# Patient Record
Sex: Female | Born: 1940
Health system: Southern US, Community
[De-identification: ages and names within clinical notes are randomized; demographics above are authoritative.]

## PROBLEM LIST (undated history)

## (undated) DIAGNOSIS — I10 Essential (primary) hypertension: Secondary | ICD-10-CM

## (undated) DIAGNOSIS — M8580 Other specified disorders of bone density and structure, unspecified site: Secondary | ICD-10-CM

## (undated) DIAGNOSIS — M5126 Other intervertebral disc displacement, lumbar region: Secondary | ICD-10-CM

## (undated) DIAGNOSIS — I471 Supraventricular tachycardia, unspecified: Secondary | ICD-10-CM

## (undated) DIAGNOSIS — I5189 Other ill-defined heart diseases: Secondary | ICD-10-CM

## (undated) DIAGNOSIS — M1990 Unspecified osteoarthritis, unspecified site: Secondary | ICD-10-CM

## (undated) DIAGNOSIS — M75101 Unspecified rotator cuff tear or rupture of right shoulder, not specified as traumatic: Secondary | ICD-10-CM

## (undated) DIAGNOSIS — E785 Hyperlipidemia, unspecified: Secondary | ICD-10-CM

## (undated) DIAGNOSIS — G629 Polyneuropathy, unspecified: Secondary | ICD-10-CM

## (undated) DIAGNOSIS — R94131 Abnormal electromyogram [EMG]: Secondary | ICD-10-CM

## (undated) DIAGNOSIS — S52501A Unspecified fracture of the lower end of right radius, initial encounter for closed fracture: Secondary | ICD-10-CM

## (undated) DIAGNOSIS — D509 Iron deficiency anemia, unspecified: Secondary | ICD-10-CM

## (undated) DIAGNOSIS — I639 Cerebral infarction, unspecified: Secondary | ICD-10-CM

## (undated) HISTORY — PX: BACK SURGERY: SHX140

## (undated) HISTORY — DX: Hyperlipidemia, unspecified: E78.5

## (undated) HISTORY — DX: Unspecified rotator cuff tear or rupture of right shoulder, not specified as traumatic: M75.101

## (undated) HISTORY — PX: SHOULDER ARTHROSCOPY: SHX128

## (undated) HISTORY — DX: Supraventricular tachycardia, unspecified: I47.10

## (undated) HISTORY — DX: Unspecified osteoarthritis, unspecified site: M19.90

## (undated) HISTORY — DX: Polyneuropathy, unspecified: G62.9

## (undated) HISTORY — DX: Other specified disorders of bone density and structure, unspecified site: M85.80

## (undated) HISTORY — DX: Other intervertebral disc displacement, lumbar region: M51.26

## (undated) HISTORY — DX: Iron deficiency anemia, unspecified: D50.9

## (undated) HISTORY — DX: Essential (primary) hypertension: I10

## (undated) HISTORY — DX: Abnormal electromyogram (EMG): R94.131

## (undated) HISTORY — DX: Supraventricular tachycardia: I47.1

---

## 1990-06-21 HISTORY — PX: TOTAL ABDOMINAL HYSTERECTOMY: SHX209

## 1996-06-21 HISTORY — PX: LAPAROSCOPIC CHOLECYSTECTOMY: SUR755

## 1997-11-04 ENCOUNTER — Encounter: Admission: RE | Admit: 1997-11-04 | Discharge: 1997-11-04 | Payer: Self-pay | Admitting: Internal Medicine

## 1997-12-12 ENCOUNTER — Ambulatory Visit: Admission: RE | Admit: 1997-12-12 | Discharge: 1997-12-12 | Payer: Self-pay | Admitting: Internal Medicine

## 1998-04-24 ENCOUNTER — Encounter: Admission: RE | Admit: 1998-04-24 | Discharge: 1998-04-24 | Payer: Self-pay | Admitting: Internal Medicine

## 1998-04-24 ENCOUNTER — Ambulatory Visit (HOSPITAL_COMMUNITY): Admission: RE | Admit: 1998-04-24 | Discharge: 1998-04-24 | Payer: Self-pay | Admitting: Internal Medicine

## 1998-10-28 ENCOUNTER — Encounter: Admission: RE | Admit: 1998-10-28 | Discharge: 1998-10-28 | Payer: Self-pay | Admitting: Internal Medicine

## 1998-10-29 ENCOUNTER — Encounter: Admission: RE | Admit: 1998-10-29 | Discharge: 1998-10-29 | Payer: Self-pay | Admitting: Internal Medicine

## 1998-12-01 ENCOUNTER — Encounter: Admission: RE | Admit: 1998-12-01 | Discharge: 1998-12-01 | Payer: Self-pay | Admitting: Internal Medicine

## 1999-01-20 ENCOUNTER — Emergency Department (HOSPITAL_COMMUNITY): Admission: EM | Admit: 1999-01-20 | Discharge: 1999-01-20 | Payer: Self-pay | Admitting: Emergency Medicine

## 1999-01-20 ENCOUNTER — Encounter: Payer: Self-pay | Admitting: Emergency Medicine

## 1999-02-01 ENCOUNTER — Ambulatory Visit (HOSPITAL_COMMUNITY): Admission: RE | Admit: 1999-02-01 | Discharge: 1999-02-01 | Payer: Self-pay | Admitting: Orthopedic Surgery

## 1999-02-01 ENCOUNTER — Encounter: Payer: Self-pay | Admitting: Orthopedic Surgery

## 2000-03-09 ENCOUNTER — Encounter: Payer: Self-pay | Admitting: Orthopedic Surgery

## 2000-03-09 ENCOUNTER — Ambulatory Visit (HOSPITAL_COMMUNITY): Admission: RE | Admit: 2000-03-09 | Discharge: 2000-03-09 | Payer: Self-pay | Admitting: Orthopedic Surgery

## 2000-03-23 ENCOUNTER — Ambulatory Visit (HOSPITAL_COMMUNITY): Admission: RE | Admit: 2000-03-23 | Discharge: 2000-03-23 | Payer: Self-pay

## 2000-04-01 ENCOUNTER — Encounter: Admission: RE | Admit: 2000-04-01 | Discharge: 2000-06-30 | Payer: Self-pay | Admitting: Anesthesiology

## 2000-08-22 ENCOUNTER — Encounter: Admission: RE | Admit: 2000-08-22 | Discharge: 2000-08-22 | Payer: Self-pay | Admitting: Internal Medicine

## 2000-09-28 ENCOUNTER — Ambulatory Visit (HOSPITAL_COMMUNITY): Admission: RE | Admit: 2000-09-28 | Discharge: 2000-09-28 | Payer: Self-pay | Admitting: Internal Medicine

## 2000-09-28 ENCOUNTER — Encounter: Admission: RE | Admit: 2000-09-28 | Discharge: 2000-09-28 | Payer: Self-pay | Admitting: Internal Medicine

## 2000-10-03 ENCOUNTER — Ambulatory Visit (HOSPITAL_COMMUNITY): Admission: RE | Admit: 2000-10-03 | Discharge: 2000-10-03 | Payer: Self-pay | Admitting: Internal Medicine

## 2000-10-07 ENCOUNTER — Encounter: Admission: RE | Admit: 2000-10-07 | Discharge: 2000-10-07 | Payer: Self-pay

## 2000-10-28 ENCOUNTER — Encounter: Payer: Self-pay | Admitting: Neurosurgery

## 2000-11-01 ENCOUNTER — Encounter: Payer: Self-pay | Admitting: Neurosurgery

## 2000-11-01 ENCOUNTER — Inpatient Hospital Stay (HOSPITAL_COMMUNITY): Admission: RE | Admit: 2000-11-01 | Discharge: 2000-11-05 | Payer: Self-pay | Admitting: Neurosurgery

## 2000-11-01 HISTORY — PX: LUMBAR DISC SURGERY: SHX700

## 2000-11-07 ENCOUNTER — Inpatient Hospital Stay (HOSPITAL_COMMUNITY): Admission: AD | Admit: 2000-11-07 | Discharge: 2000-11-11 | Payer: Self-pay | Admitting: Neurosurgery

## 2000-11-08 ENCOUNTER — Encounter: Payer: Self-pay | Admitting: Neurosurgery

## 2000-11-10 ENCOUNTER — Encounter: Payer: Self-pay | Admitting: Neurosurgery

## 2001-02-07 ENCOUNTER — Encounter: Admission: RE | Admit: 2001-02-07 | Discharge: 2001-02-07 | Payer: Self-pay | Admitting: Family Medicine

## 2001-02-08 ENCOUNTER — Encounter: Admission: RE | Admit: 2001-02-08 | Discharge: 2001-02-08 | Payer: Self-pay | Admitting: Family Medicine

## 2001-04-02 ENCOUNTER — Emergency Department (HOSPITAL_COMMUNITY): Admission: EM | Admit: 2001-04-02 | Discharge: 2001-04-02 | Payer: Self-pay | Admitting: Emergency Medicine

## 2001-04-02 ENCOUNTER — Encounter: Payer: Self-pay | Admitting: Emergency Medicine

## 2001-04-05 ENCOUNTER — Encounter: Admission: RE | Admit: 2001-04-05 | Discharge: 2001-04-05 | Payer: Self-pay | Admitting: Family Medicine

## 2001-08-23 ENCOUNTER — Emergency Department (HOSPITAL_COMMUNITY): Admission: EM | Admit: 2001-08-23 | Discharge: 2001-08-24 | Payer: Self-pay

## 2001-08-30 ENCOUNTER — Ambulatory Visit (HOSPITAL_COMMUNITY): Admission: RE | Admit: 2001-08-30 | Discharge: 2001-08-30 | Payer: Self-pay | Admitting: Neurosurgery

## 2001-08-30 ENCOUNTER — Encounter: Payer: Self-pay | Admitting: Neurosurgery

## 2001-10-31 ENCOUNTER — Encounter: Admission: RE | Admit: 2001-10-31 | Discharge: 2001-10-31 | Payer: Self-pay | Admitting: Sports Medicine

## 2002-01-09 ENCOUNTER — Encounter: Admission: RE | Admit: 2002-01-09 | Discharge: 2002-01-09 | Payer: Self-pay | Admitting: Internal Medicine

## 2002-01-11 ENCOUNTER — Encounter: Admission: RE | Admit: 2002-01-11 | Discharge: 2002-01-11 | Payer: Self-pay | Admitting: Internal Medicine

## 2002-02-14 ENCOUNTER — Encounter: Admission: RE | Admit: 2002-02-14 | Discharge: 2002-02-14 | Payer: Self-pay | Admitting: Internal Medicine

## 2002-02-16 ENCOUNTER — Ambulatory Visit (HOSPITAL_COMMUNITY): Admission: RE | Admit: 2002-02-16 | Discharge: 2002-02-16 | Payer: Self-pay | Admitting: Internal Medicine

## 2002-03-05 ENCOUNTER — Encounter: Admission: RE | Admit: 2002-03-05 | Discharge: 2002-03-05 | Payer: Self-pay | Admitting: Internal Medicine

## 2002-05-30 ENCOUNTER — Encounter: Admission: RE | Admit: 2002-05-30 | Discharge: 2002-05-30 | Payer: Self-pay | Admitting: Internal Medicine

## 2002-10-09 ENCOUNTER — Encounter: Admission: RE | Admit: 2002-10-09 | Discharge: 2002-10-09 | Payer: Self-pay | Admitting: Internal Medicine

## 2003-02-12 ENCOUNTER — Encounter: Admission: RE | Admit: 2003-02-12 | Discharge: 2003-02-12 | Payer: Self-pay | Admitting: Internal Medicine

## 2003-05-15 ENCOUNTER — Encounter: Admission: RE | Admit: 2003-05-15 | Discharge: 2003-05-15 | Payer: Self-pay | Admitting: Internal Medicine

## 2003-07-31 ENCOUNTER — Encounter: Admission: RE | Admit: 2003-07-31 | Discharge: 2003-07-31 | Payer: Self-pay | Admitting: Internal Medicine

## 2003-08-04 ENCOUNTER — Emergency Department (HOSPITAL_COMMUNITY): Admission: EM | Admit: 2003-08-04 | Discharge: 2003-08-04 | Payer: Self-pay | Admitting: Emergency Medicine

## 2003-08-07 ENCOUNTER — Encounter: Admission: RE | Admit: 2003-08-07 | Discharge: 2003-08-07 | Payer: Self-pay | Admitting: Internal Medicine

## 2003-08-15 ENCOUNTER — Encounter: Admission: RE | Admit: 2003-08-15 | Discharge: 2003-08-15 | Payer: Self-pay | Admitting: Internal Medicine

## 2003-08-20 ENCOUNTER — Ambulatory Visit (HOSPITAL_COMMUNITY): Admission: RE | Admit: 2003-08-20 | Discharge: 2003-08-20 | Payer: Self-pay | Admitting: Internal Medicine

## 2003-08-26 ENCOUNTER — Ambulatory Visit (HOSPITAL_COMMUNITY): Admission: RE | Admit: 2003-08-26 | Discharge: 2003-08-26 | Payer: Self-pay | Admitting: Internal Medicine

## 2003-11-12 ENCOUNTER — Encounter: Admission: RE | Admit: 2003-11-12 | Discharge: 2003-11-12 | Payer: Self-pay | Admitting: Internal Medicine

## 2004-02-26 ENCOUNTER — Ambulatory Visit: Payer: Self-pay | Admitting: Internal Medicine

## 2004-04-06 ENCOUNTER — Ambulatory Visit: Payer: Self-pay | Admitting: Internal Medicine

## 2004-06-03 ENCOUNTER — Ambulatory Visit: Payer: Self-pay | Admitting: Internal Medicine

## 2004-08-19 ENCOUNTER — Ambulatory Visit: Payer: Self-pay | Admitting: Internal Medicine

## 2004-08-25 ENCOUNTER — Ambulatory Visit (HOSPITAL_COMMUNITY): Admission: RE | Admit: 2004-08-25 | Discharge: 2004-08-25 | Payer: Self-pay

## 2004-09-17 ENCOUNTER — Ambulatory Visit: Payer: Self-pay | Admitting: Internal Medicine

## 2004-10-21 ENCOUNTER — Ambulatory Visit: Payer: Self-pay | Admitting: Internal Medicine

## 2004-12-11 ENCOUNTER — Ambulatory Visit (HOSPITAL_COMMUNITY): Admission: RE | Admit: 2004-12-11 | Discharge: 2004-12-11 | Payer: Self-pay | Admitting: Internal Medicine

## 2005-05-06 ENCOUNTER — Emergency Department (HOSPITAL_COMMUNITY): Admission: EM | Admit: 2005-05-06 | Discharge: 2005-05-06 | Payer: Self-pay | Admitting: Emergency Medicine

## 2005-05-20 ENCOUNTER — Ambulatory Visit: Payer: Self-pay

## 2005-06-03 ENCOUNTER — Ambulatory Visit: Payer: Self-pay | Admitting: Internal Medicine

## 2005-06-30 ENCOUNTER — Ambulatory Visit: Payer: Self-pay | Admitting: Internal Medicine

## 2005-06-30 ENCOUNTER — Ambulatory Visit (HOSPITAL_COMMUNITY): Admission: RE | Admit: 2005-06-30 | Discharge: 2005-06-30 | Payer: Self-pay | Admitting: Internal Medicine

## 2005-07-07 ENCOUNTER — Ambulatory Visit (HOSPITAL_BASED_OUTPATIENT_CLINIC_OR_DEPARTMENT_OTHER): Admission: RE | Admit: 2005-07-07 | Discharge: 2005-07-07 | Payer: Self-pay | Admitting: Orthopedic Surgery

## 2005-07-20 ENCOUNTER — Encounter: Admission: RE | Admit: 2005-07-20 | Discharge: 2005-08-19 | Payer: Self-pay | Admitting: Orthopedic Surgery

## 2005-08-20 ENCOUNTER — Encounter: Admission: RE | Admit: 2005-08-20 | Discharge: 2005-11-11 | Payer: Self-pay | Admitting: Orthopedic Surgery

## 2005-08-31 ENCOUNTER — Ambulatory Visit: Payer: Self-pay | Admitting: Internal Medicine

## 2005-09-14 ENCOUNTER — Encounter: Payer: Self-pay | Admitting: Internal Medicine

## 2005-09-20 ENCOUNTER — Ambulatory Visit: Payer: Self-pay | Admitting: Hospitalist

## 2006-01-26 ENCOUNTER — Ambulatory Visit: Payer: Self-pay | Admitting: Internal Medicine

## 2006-03-21 ENCOUNTER — Ambulatory Visit (HOSPITAL_COMMUNITY): Admission: RE | Admit: 2006-03-21 | Discharge: 2006-03-21 | Payer: Self-pay | Admitting: Internal Medicine

## 2006-03-21 ENCOUNTER — Encounter: Payer: Self-pay | Admitting: Internal Medicine

## 2006-04-27 DIAGNOSIS — E785 Hyperlipidemia, unspecified: Secondary | ICD-10-CM | POA: Insufficient documentation

## 2006-04-27 DIAGNOSIS — M1990 Unspecified osteoarthritis, unspecified site: Secondary | ICD-10-CM | POA: Insufficient documentation

## 2006-04-27 DIAGNOSIS — Z87898 Personal history of other specified conditions: Secondary | ICD-10-CM | POA: Insufficient documentation

## 2006-04-27 DIAGNOSIS — M545 Low back pain, unspecified: Secondary | ICD-10-CM | POA: Insufficient documentation

## 2006-04-27 DIAGNOSIS — Z9089 Acquired absence of other organs: Secondary | ICD-10-CM | POA: Insufficient documentation

## 2006-04-27 DIAGNOSIS — Z9079 Acquired absence of other genital organ(s): Secondary | ICD-10-CM | POA: Insufficient documentation

## 2006-04-27 DIAGNOSIS — N951 Menopausal and female climacteric states: Secondary | ICD-10-CM | POA: Insufficient documentation

## 2006-04-27 DIAGNOSIS — Z8679 Personal history of other diseases of the circulatory system: Secondary | ICD-10-CM | POA: Insufficient documentation

## 2006-04-27 DIAGNOSIS — M25569 Pain in unspecified knee: Secondary | ICD-10-CM | POA: Insufficient documentation

## 2006-05-02 ENCOUNTER — Encounter: Payer: Self-pay | Admitting: Internal Medicine

## 2006-05-04 ENCOUNTER — Ambulatory Visit: Payer: Self-pay | Admitting: Internal Medicine

## 2006-05-10 ENCOUNTER — Encounter: Payer: Self-pay | Admitting: Vascular Surgery

## 2006-05-10 ENCOUNTER — Ambulatory Visit (HOSPITAL_COMMUNITY): Admission: RE | Admit: 2006-05-10 | Discharge: 2006-05-10 | Payer: Self-pay | Admitting: Internal Medicine

## 2006-05-10 ENCOUNTER — Encounter: Payer: Self-pay | Admitting: Internal Medicine

## 2006-05-25 ENCOUNTER — Ambulatory Visit: Payer: Self-pay | Admitting: Internal Medicine

## 2006-09-14 ENCOUNTER — Ambulatory Visit: Payer: Self-pay | Admitting: Internal Medicine

## 2006-09-14 DIAGNOSIS — R748 Abnormal levels of other serum enzymes: Secondary | ICD-10-CM | POA: Insufficient documentation

## 2006-09-14 DIAGNOSIS — I10 Essential (primary) hypertension: Secondary | ICD-10-CM | POA: Insufficient documentation

## 2006-09-14 DIAGNOSIS — M5126 Other intervertebral disc displacement, lumbar region: Secondary | ICD-10-CM | POA: Insufficient documentation

## 2006-09-16 ENCOUNTER — Telehealth (INDEPENDENT_AMBULATORY_CARE_PROVIDER_SITE_OTHER): Payer: Self-pay | Admitting: *Deleted

## 2006-09-19 ENCOUNTER — Encounter: Payer: Self-pay | Admitting: Internal Medicine

## 2006-09-19 ENCOUNTER — Ambulatory Visit: Payer: Self-pay | Admitting: *Deleted

## 2006-09-19 DIAGNOSIS — M542 Cervicalgia: Secondary | ICD-10-CM | POA: Insufficient documentation

## 2006-09-19 LAB — CONVERTED CEMR LAB
AST: 13 units/L (ref 0–37)
Alkaline Phosphatase: 141 units/L — ABNORMAL HIGH (ref 39–117)
BUN: 10 mg/dL (ref 6–23)
Creatinine, Ser: 0.55 mg/dL (ref 0.40–1.20)
HDL: 48 mg/dL (ref >39)
LDL Cholesterol: 175 mg/dL — ABNORMAL HIGH (ref 0–99)
TSH: 3.234 microintl units/mL (ref 0.350–5.50)
Total CHOL/HDL Ratio: 5

## 2006-09-23 ENCOUNTER — Encounter: Payer: Self-pay | Admitting: Internal Medicine

## 2006-10-27 ENCOUNTER — Telehealth (INDEPENDENT_AMBULATORY_CARE_PROVIDER_SITE_OTHER): Payer: Self-pay | Admitting: *Deleted

## 2006-12-07 ENCOUNTER — Ambulatory Visit: Payer: Self-pay | Admitting: Internal Medicine

## 2006-12-07 ENCOUNTER — Ambulatory Visit (HOSPITAL_COMMUNITY): Admission: RE | Admit: 2006-12-07 | Discharge: 2006-12-07 | Payer: Self-pay | Admitting: Internal Medicine

## 2006-12-08 ENCOUNTER — Encounter: Payer: Self-pay | Admitting: Internal Medicine

## 2006-12-08 LAB — CONVERTED CEMR LAB
AST: 13 units/L (ref 0–37)
Albumin: 4.1 g/dL (ref 3.5–5.2)
Alkaline Phosphatase: 136 units/L — ABNORMAL HIGH (ref 39–117)
BUN: 10 mg/dL (ref 6–23)
Basophils Relative: 0 % (ref 0–1)
Digitoxin Lvl: 1.3 ng/mL (ref 0.8–2.0)
Eosinophils Absolute: 0.1 10*3/uL (ref 0.0–0.7)
Eosinophils Relative: 2 % (ref 0–5)
HCT: 38.6 % (ref 36.0–46.0)
MCHC: 31.3 g/dL (ref 30.0–36.0)
MCV: 84.6 fL (ref 78.0–100.0)
Monocytes Relative: 6 % (ref 3–11)
Neutrophils Relative %: 59 % (ref 43–77)
Platelets: 186 10*3/uL (ref 150–400)
Potassium: 3.5 meq/L (ref 3.5–5.3)
RBC: 4.56 M/uL (ref 3.87–5.11)
TSH: 1.79 microintl units/mL (ref 0.350–5.50)
Total Bilirubin: 0.5 mg/dL (ref 0.3–1.2)

## 2007-02-22 ENCOUNTER — Ambulatory Visit: Payer: Self-pay | Admitting: Internal Medicine

## 2007-02-22 ENCOUNTER — Ambulatory Visit (HOSPITAL_COMMUNITY): Admission: RE | Admit: 2007-02-22 | Discharge: 2007-02-22 | Payer: Self-pay | Admitting: Internal Medicine

## 2007-02-22 DIAGNOSIS — I4949 Other premature depolarization: Secondary | ICD-10-CM | POA: Insufficient documentation

## 2007-02-22 DIAGNOSIS — R609 Edema, unspecified: Secondary | ICD-10-CM | POA: Insufficient documentation

## 2007-02-22 LAB — CONVERTED CEMR LAB
ALT: 14 units/L (ref 0–35)
AST: 13 units/L (ref 0–37)
Albumin: 4.2 g/dL (ref 3.5–5.2)
CO2: 29 meq/L (ref 19–32)
Calcium: 9.6 mg/dL (ref 8.4–10.5)
Chloride: 104 meq/L (ref 96–112)
Creatinine, Ser: 0.56 mg/dL (ref 0.40–1.20)
Digitoxin Lvl: 1.4 ng/mL (ref 0.8–2.0)
Potassium: 4.1 meq/L (ref 3.5–5.3)
Sodium: 143 meq/L (ref 135–145)
Total Protein: 6.6 g/dL (ref 6.0–8.3)

## 2007-03-02 ENCOUNTER — Ambulatory Visit (HOSPITAL_COMMUNITY): Admission: RE | Admit: 2007-03-02 | Discharge: 2007-03-02 | Payer: Self-pay | Admitting: Internal Medicine

## 2007-03-02 ENCOUNTER — Encounter: Payer: Self-pay | Admitting: Internal Medicine

## 2007-03-21 ENCOUNTER — Emergency Department (HOSPITAL_COMMUNITY): Admission: EM | Admit: 2007-03-21 | Discharge: 2007-03-21 | Payer: Self-pay | Admitting: Emergency Medicine

## 2007-04-07 ENCOUNTER — Ambulatory Visit (HOSPITAL_COMMUNITY): Admission: RE | Admit: 2007-04-07 | Discharge: 2007-04-07 | Payer: Self-pay | Admitting: Internal Medicine

## 2007-04-18 ENCOUNTER — Telehealth: Payer: Self-pay | Admitting: Internal Medicine

## 2007-06-30 ENCOUNTER — Encounter: Payer: Self-pay | Admitting: Internal Medicine

## 2007-07-10 ENCOUNTER — Ambulatory Visit: Payer: Self-pay | Admitting: Internal Medicine

## 2007-07-10 LAB — CONVERTED CEMR LAB
ALT: 11 units/L (ref 0–35)
AST: 12 units/L (ref 0–37)
Alkaline Phosphatase: 131 units/L — ABNORMAL HIGH (ref 39–117)
BUN: 10 mg/dL (ref 6–23)
Calcium: 9.5 mg/dL (ref 8.4–10.5)
Creatinine, Ser: 0.54 mg/dL (ref 0.40–1.20)
Total Bilirubin: 0.7 mg/dL (ref 0.3–1.2)

## 2007-10-18 ENCOUNTER — Ambulatory Visit: Payer: Self-pay | Admitting: Internal Medicine

## 2007-10-18 LAB — CONVERTED CEMR LAB
ALT: 10 units/L (ref 0–35)
Alkaline Phosphatase: 128 units/L — ABNORMAL HIGH (ref 39–117)
Bilirubin, Direct: 0.2 mg/dL (ref 0.0–0.3)
Indirect Bilirubin: 0.6 mg/dL (ref 0.0–0.9)
Total Protein: 6.2 g/dL (ref 6.0–8.3)

## 2007-12-05 ENCOUNTER — Emergency Department (HOSPITAL_COMMUNITY): Admission: EM | Admit: 2007-12-05 | Discharge: 2007-12-05 | Payer: Self-pay | Admitting: Emergency Medicine

## 2008-04-24 ENCOUNTER — Ambulatory Visit: Payer: Self-pay | Admitting: Internal Medicine

## 2008-04-24 DIAGNOSIS — M25559 Pain in unspecified hip: Secondary | ICD-10-CM | POA: Insufficient documentation

## 2008-04-24 DIAGNOSIS — E876 Hypokalemia: Secondary | ICD-10-CM | POA: Insufficient documentation

## 2008-05-02 LAB — CONVERTED CEMR LAB
ALT: 10 units/L (ref 0–35)
AST: 12 units/L (ref 0–37)
Alkaline Phosphatase: 137 units/L — ABNORMAL HIGH (ref 39–117)
BUN: 11 mg/dL (ref 6–23)
Basophils Absolute: 0 10*3/uL (ref 0.0–0.1)
Basophils Relative: 0 % (ref 0–1)
Creatinine, Ser: 0.58 mg/dL (ref 0.40–1.20)
Eosinophils Absolute: 0.1 10*3/uL (ref 0.0–0.7)
Hemoglobin: 12 g/dL (ref 12.0–15.0)
MCHC: 31.7 g/dL (ref 30.0–36.0)
MCV: 85.4 fL (ref 78.0–100.0)
Monocytes Absolute: 0.5 10*3/uL (ref 0.1–1.0)
Monocytes Relative: 6 % (ref 3–12)
Neutro Abs: 4.8 10*3/uL (ref 1.7–7.7)
Neutrophils Relative %: 61 % (ref 43–77)
RBC: 4.44 M/uL (ref 3.87–5.11)
RDW: 13.5 % (ref 11.5–15.5)

## 2008-07-11 ENCOUNTER — Encounter: Payer: Self-pay | Admitting: Internal Medicine

## 2008-10-15 ENCOUNTER — Ambulatory Visit (HOSPITAL_COMMUNITY): Admission: RE | Admit: 2008-10-15 | Discharge: 2008-10-15 | Payer: Self-pay | Admitting: Internal Medicine

## 2008-10-24 ENCOUNTER — Emergency Department (HOSPITAL_COMMUNITY): Admission: EM | Admit: 2008-10-24 | Discharge: 2008-10-24 | Payer: Self-pay | Admitting: Emergency Medicine

## 2008-11-27 ENCOUNTER — Ambulatory Visit: Payer: Self-pay | Admitting: Internal Medicine

## 2008-11-29 ENCOUNTER — Emergency Department (HOSPITAL_COMMUNITY): Admission: EM | Admit: 2008-11-29 | Discharge: 2008-11-29 | Payer: Self-pay | Admitting: Emergency Medicine

## 2008-11-29 ENCOUNTER — Telehealth: Payer: Self-pay | Admitting: *Deleted

## 2008-11-29 LAB — CONVERTED CEMR LAB
Albumin: 4.1 g/dL (ref 3.5–5.2)
BUN: 10 mg/dL (ref 6–23)
CO2: 28 meq/L (ref 19–32)
Calcium: 9.4 mg/dL (ref 8.4–10.5)
Chloride: 103 meq/L (ref 96–112)
Creatinine, Ser: 0.56 mg/dL (ref 0.40–1.20)
Digitoxin Lvl: 0.7 ng/mL — ABNORMAL LOW (ref 0.8–2.0)
GFR calc Af Amer: >60 mL/min (ref >60)
GFR calc non Af Amer: >60 mL/min (ref >60)
Glucose, Bld: 128 mg/dL — ABNORMAL HIGH (ref 70–99)
Potassium: 3.3 meq/L — ABNORMAL LOW (ref 3.5–5.3)

## 2009-01-29 ENCOUNTER — Ambulatory Visit: Payer: Self-pay | Admitting: Internal Medicine

## 2009-01-29 LAB — CONVERTED CEMR LAB
BUN: 10 mg/dL (ref 6–23)
Calcium: 9.8 mg/dL (ref 8.4–10.5)
Chloride: 101 meq/L (ref 96–112)
Cholesterol: 235 mg/dL — ABNORMAL HIGH (ref 0–200)
Creatinine, Ser: 0.51 mg/dL (ref 0.40–1.20)
LDL Cholesterol: 164 mg/dL — ABNORMAL HIGH (ref 0–99)
Triglycerides: 90 mg/dL (ref <150)

## 2009-01-31 DIAGNOSIS — J301 Allergic rhinitis due to pollen: Secondary | ICD-10-CM | POA: Insufficient documentation

## 2009-02-11 ENCOUNTER — Telehealth: Payer: Self-pay | Admitting: Internal Medicine

## 2009-02-13 ENCOUNTER — Encounter: Payer: Self-pay | Admitting: Internal Medicine

## 2009-02-13 ENCOUNTER — Ambulatory Visit (HOSPITAL_COMMUNITY): Admission: RE | Admit: 2009-02-13 | Discharge: 2009-02-13 | Payer: Self-pay | Admitting: Internal Medicine

## 2009-04-03 DIAGNOSIS — M810 Age-related osteoporosis without current pathological fracture: Secondary | ICD-10-CM | POA: Insufficient documentation

## 2009-05-13 ENCOUNTER — Encounter: Payer: Self-pay | Admitting: Internal Medicine

## 2009-05-22 ENCOUNTER — Encounter: Payer: Self-pay | Admitting: Internal Medicine

## 2009-06-25 ENCOUNTER — Ambulatory Visit: Payer: Self-pay | Admitting: Internal Medicine

## 2009-06-25 DIAGNOSIS — E039 Hypothyroidism, unspecified: Secondary | ICD-10-CM | POA: Insufficient documentation

## 2009-07-18 ENCOUNTER — Ambulatory Visit: Payer: Self-pay | Admitting: Internal Medicine

## 2009-07-23 ENCOUNTER — Encounter: Payer: Self-pay | Admitting: Internal Medicine

## 2009-08-01 LAB — CONVERTED CEMR LAB
CO2: 29 meq/L (ref 19–32)
Chloride: 104 meq/L (ref 96–112)
Creatinine, Ser: 0.5 mg/dL (ref 0.40–1.20)
Free T4: 0.81 ng/dL (ref 0.80–1.80)
Glucose, Bld: 101 mg/dL — ABNORMAL HIGH (ref 70–99)
HDL: 51 mg/dL (ref >39)
LDL Cholesterol: 163 mg/dL — ABNORMAL HIGH (ref 0–99)
TSH: 4.833 microintl units/mL — ABNORMAL HIGH (ref 0.350–4.5)

## 2009-10-28 ENCOUNTER — Ambulatory Visit: Payer: Self-pay | Admitting: Internal Medicine

## 2009-10-28 DIAGNOSIS — G629 Polyneuropathy, unspecified: Secondary | ICD-10-CM | POA: Insufficient documentation

## 2009-10-28 DIAGNOSIS — G609 Hereditary and idiopathic neuropathy, unspecified: Secondary | ICD-10-CM

## 2009-10-28 DIAGNOSIS — L919 Hypertrophic disorder of the skin, unspecified: Secondary | ICD-10-CM

## 2009-10-28 DIAGNOSIS — L909 Atrophic disorder of skin, unspecified: Secondary | ICD-10-CM | POA: Insufficient documentation

## 2009-10-31 ENCOUNTER — Ambulatory Visit (HOSPITAL_COMMUNITY): Admission: RE | Admit: 2009-10-31 | Discharge: 2009-10-31 | Payer: Self-pay | Admitting: Internal Medicine

## 2010-01-29 ENCOUNTER — Telehealth: Payer: Self-pay

## 2010-01-30 ENCOUNTER — Ambulatory Visit: Payer: Self-pay | Admitting: Internal Medicine

## 2010-03-02 ENCOUNTER — Telehealth: Payer: Self-pay

## 2010-03-08 ENCOUNTER — Encounter: Payer: Self-pay | Admitting: Internal Medicine

## 2010-03-31 ENCOUNTER — Ambulatory Visit: Payer: Self-pay | Admitting: Internal Medicine

## 2010-03-31 LAB — CONVERTED CEMR LAB
ALT: 10 units/L (ref 0–35)
AST: 13 units/L (ref 0–37)
Albumin: 4.4 g/dL (ref 3.5–5.2)
CO2: 30 meq/L (ref 19–32)
Calcium: 9.8 mg/dL (ref 8.4–10.5)
Chloride: 100 meq/L (ref 96–112)
Potassium: 3.5 meq/L (ref 3.5–5.3)
Sodium: 141 meq/L (ref 135–145)
TSH: 2.076 microintl units/mL (ref 0.350–4.5)
Total Protein: 6.6 g/dL (ref 6.0–8.3)

## 2010-05-06 ENCOUNTER — Ambulatory Visit: Payer: Self-pay | Admitting: Internal Medicine

## 2010-05-08 DIAGNOSIS — E559 Vitamin D deficiency, unspecified: Secondary | ICD-10-CM | POA: Insufficient documentation

## 2010-05-08 LAB — CONVERTED CEMR LAB
ALT: 12 units/L (ref 0–35)
Albumin: 4.3 g/dL (ref 3.5–5.2)
CO2: 30 meq/L (ref 19–32)
Calcium: 9.9 mg/dL (ref 8.4–10.5)
Cholesterol: 210 mg/dL — ABNORMAL HIGH (ref 0–200)
Glucose, Bld: 124 mg/dL — ABNORMAL HIGH (ref 70–99)
Potassium: 3.6 meq/L (ref 3.5–5.3)
Total Bilirubin: 0.9 mg/dL (ref 0.3–1.2)
Total Protein: 6.3 g/dL (ref 6.0–8.3)
Triglycerides: 54 mg/dL (ref <150)
VLDL: 11 mg/dL (ref 0–40)
Vit D, 25-Hydroxy: 24 ng/mL — ABNORMAL LOW (ref 30–89)
Vitamin B-12: 699 pg/mL (ref 211–911)

## 2010-07-11 ENCOUNTER — Encounter: Payer: Self-pay | Admitting: Internal Medicine

## 2010-07-12 ENCOUNTER — Encounter: Payer: Self-pay | Admitting: Internal Medicine

## 2010-07-23 NOTE — Assessment & Plan Note (Signed)
Summary: EST-CK/FU/MEDS/CFB   Vital Signs:  Patient profile:   70 year old female Height:      68 inches Weight:      236.1 pounds BMI:     36.03 Temp:     97.6 degrees F oral Pulse rate:   75 / minute BP sitting:   125 / 75  (right arm)  Vitals Entered By: Filomena Jungling NT II (June 25, 2009 10:12 AM) CC: BILATERAL KNEE PAIN STIFF Is Patient Diabetic? No Pain Assessment Patient in pain? yes     Location: KNEES, Intensity: 5 Type: aching Nutritional Status BMI of > 30 = obese  Have you ever been in a relationship where you felt threatened, hurt or afraid?No   Does patient need assistance? Functional Status Self care Ambulation Normal   Primary Care Provider:  Margarito Liner MD  CC:  BILATERAL KNEE PAIN STIFF.  History of Present Illness: Patient returns for follow up of her hypertension, hyperlipidemia, and other chronic medical problems.  Her main complaint is chronic bilateral knee pain and right lower extremity pain; since her last visit here, she was referred by her orthopedist Dr. Turner Daniels to [facility] for evaluation of the right lower extremity pain.  She apparently had nerve conduction studies done at [facility] (I do not have the results) and she says that she has an appointment for follow-up there in February.  She has been taking her pravastatin without problems.  She reports that she is compliant with her medications.  Her pain is reasonably well controlled on her current regimen.  Patient also reports an intermittent sensation of something in her right ear canal, but she denies pain, discharge, or hearing loss.  Preventive Screening-Counseling & Management  Alcohol-Tobacco     Smoking Status: quit     Year Quit: 1996     Pack years: 2  Caffeine-Diet-Exercise     Does Patient Exercise: no  Current Medications (verified): 1)  Atenolol 50 Mg Tabs (Atenolol) .... Take 1 Tablet By Mouth Once A Day 2)  Lanoxin 0.25 Mg Tabs (Digoxin) .... Take 1/2  Tablet By Mouth Once A Day 3)  Hydrochlorothiazide 25 Mg Tabs (Hydrochlorothiazide) .... Take 1 Tablet By Mouth Once A Day 4)  Clonidine Hcl 0.1 Mg Tabs (Clonidine Hcl) .... Take 1 Tablet By Mouth Once A Day 5)  Pravachol 10 Mg  Tabs (Pravastatin Sodium) .... Take 1 Tablet By Mouth Once A Day 6)  Potassium Chloride Cr 8 Meq Cr-Tabs (Potassium Chloride) .... Take 1 Tablet By Mouth Once A Day 7)  Meloxicam 7.5 Mg Tabs (Meloxicam) .... Take 1 Tablet By Mouth Two Times A Day As Needed For Pain 8)  Hydrocodone-Acetaminophen 5-500 Mg Tabs (Hydrocodone-Acetaminophen) .... Take 1 Tablet By Mouth Two Times A Day As Needed For Pain 9)  Vitamin D 2000 Unit Caps (Cholecalciferol) .... Take 1 Capsule By Mouth Once A Day  Allergies (verified): 1)  ! Pcn 2)  ! Sulfa  Review of Systems General:  Denies chills, fever, and sweats. ENT:  Denies decreased hearing, ear discharge, and earache. CV:  Denies chest pain or discomfort. Resp:  Denies shortness of breath. GI:  Denies abdominal pain, nausea, and vomiting. MS:  Denies muscle aches and cramps.  Physical Exam  General:  alert, no distress   Ears:  exam of right ear shows no tenderness; moderate amount of cerumen in right ear canal; TM appears normal Lungs:  normal respiratory effort, normal breath sounds, no crackles, and no wheezes.  Heart:  normal rate, regular rhythm, no murmur, no gallop, and no rub.   Extremities:  trace left pedal edema and trace right pedal edema.     Impression & Recommendations:  Problem # 1:  HYPERTENSION (ICD-401.9) Patient's blood pressure is well controlled on current regimen.  Plan is to continue current antihypertensive medications.  Her updated medication list for this problem includes:    Atenolol 50 Mg Tabs (Atenolol) .[name]... Take 1 tablet by mouth once a day    Hydrochlorothiazide 25 Mg Tabs (Hydrochlorothiazide) .[name]... Take 1 tablet by mouth once a day    Clonidine Hcl 0.1 Mg Tabs (Clonidine hcl) .[name]... Take 1  tablet by mouth once a day  BP today: 125/75 Prior BP: 115/76 (01/29/2009)  Prior 10 Yr Risk Heart Disease: 9 % (01/29/2009)  Labs Reviewed: K+: 3.5 (01/29/2009) Creat: : 0.51 (01/29/2009)   Chol: 235 (01/29/2009)   HDL: 53 (01/29/2009)   LDL: 164 (01/29/2009)   TG: 90 (01/29/2009)  Problem # 2:  HYPERLIPIDEMIA (ICD-272.4) Patient reports that she is taking her pravastatin regularly, and she has no apparent side effects.  She will return within one week for a fasting lipid panel.  Her updated medication list for this problem includes:    Pravachol 10 Mg Tabs (Pravastatin sodium) .[name]... Take 1 tablet by mouth once a day  Labs Reviewed: SGOT: 13 (11/27/2008)   SGPT: 11 (11/27/2008)  Lipid Goals: LDL Goal: 130 (01/29/2009)     Prior 10 Yr Risk Heart Disease: 9 % (01/29/2009)   HDL:53 (01/29/2009), 48 (09/19/2006)  LDL:164 (01/29/2009), 175 (09/19/2006)  Chol:235 (01/29/2009), 239 (09/19/2006)  Trig:90 (01/29/2009), 81 (09/19/2006)  Future Orders: T-Lipid Profile ([id number]) ... 06/26/2009  Problem # 3:  CERUMEN IMPACTION, RIGHT (ICD-380.4) Patient will have her right ear irrigated by nurse when she returns for her labs.  Problem # 4:  DEGENERATIVE JOINT DISEASE (ICD-715.90) Patient's pain is doing reasonably well on her current regimen.  She will follow up with her orthopedist as scheduled.  I will request a copy of her nerve conduction study results, which should help determine whether her right lower the pain is neuropathic in nature.  She will also follow-up with neurology in February as scheduled.  The following medications were removed from the medication list:    Etodolac 400 Mg Tabs (Etodolac) .[name]... Take 1 tablet by mouth two times a day before meals Her updated medication list for this problem includes:    Meloxicam 7.5 Mg Tabs (Meloxicam) .[name]... Take 1 tablet by mouth two times a day as needed for pain    Hydrocodone-acetaminophen 5-500 Mg Tabs (Hydrocodone-acetaminophen)  .[name]... Take 1 tablet by mouth two times a day as needed for pain  Problem # 5:  HYPOTHYROIDISM, BORDERLINE (ICD-244.9) A TSH was 6.23 when measured in December at the time of her neurology visit.  The plan is to check a TSH and free T4 when she returns for fasting labs.  Problem # 6:  HYPOKALEMIA (ICD-276.8) Potassium was 3.2 when measured in December at the time of her neurology visit.  Will check a basic metabolic panel when she returns for fasting labs.  Future Orders: T-Basic Metabolic Panel [id number]) ... 06/26/2009  Complete Medication List: 1)  Atenolol 50 Mg Tabs (Atenolol) .... Take 1 tablet by mouth once a day 2)  Lanoxin 0.25 Mg Tabs (Digoxin) .... Take 1/2 tablet by mouth once a day 3)  Hydrochlorothiazide 25 Mg Tabs (Hydrochlorothiazide) .... Take 1 tablet by mouth once a day 4)  Clonidine Hcl 0.1 Mg Tabs (Clonidine hcl) .... Take 1 tablet by mouth once a day 5)  Pravachol 10 Mg Tabs (Pravastatin sodium) .... Take 1 tablet by mouth once a day 6)  Potassium Chloride Cr 8 Meq Cr-tabs (Potassium chloride) .... Take 1 tablet by mouth once a day 7)  Meloxicam 7.5 Mg Tabs (Meloxicam) .... Take 1 tablet by mouth two times a day as needed for pain 8)  Hydrocodone-acetaminophen 5-500 Mg Tabs (Hydrocodone-acetaminophen) .... Take 1 tablet by mouth two times a day as needed for pain 9)  Vitamin D 2000 Unit Caps (Cholecalciferol) .... Take 1 capsule by mouth once a day  Other Orders: Influenza Vaccine MCR ([id number]) Future Orders: T-TSH ([id number]) ... 06/26/2009 T-T4, Free [id number]) ... 06/26/2009  Patient Instructions: 1)  Return for fasting lab work within 1 week; nurse to irrigate right ear for earwax removal when you return for labs. 2)  Please schedule a follow-up appointment in 2 months.  Prevention & Chronic Care Immunizations   Influenza vaccine: Fluvax MCR  (06/25/2009)   Influenza vaccine deferral: Deferred  (01/29/2009)   Influenza vaccine due: 02/19/2009     Tetanus booster: Not documented    Pneumococcal vaccine: Pneumovax  (06/03/2005)    H. zoster vaccine: Not documented   H. zoster vaccine deferral: Deferred  (01/29/2009)  Colorectal Screening   Hemoccult: negative x 3  (09/19/2006)   Hemoccult due: 09/2007    Colonoscopy: Results:  One 5mm polyp in the rectum (benign appearing).  Resected and retrieved. Pathology:  Hyperplastic polyp.  Procedure performed by:  Dr. Everardo All. Madilyn Fireman Location:  [facility].     (05/31/2007)  Other Screening   Pap smear: Not documented   Pap smear action/deferral: Not indicated S/P hysterectomy  (01/29/2009)    Mammogram: ASSESSMENT: Negative - BI-RADS 1^MM DIGITAL SCREENING  (10/15/2008)   Mammogram due: 04/2008    DXA bone density scan: Left and right femoral neck T- score was -1.6, consistent with osteopenia.  Lumbar spine bone density was normal.  (02/13/2009)   DXA bone density action/deferral: Ordered  (01/29/2009)   Smoking status: quit  (06/25/2009)  Lipids   Total Cholesterol: 235  (01/29/2009)   Lipid panel action/deferral: Lipid Panel ordered   LDL: 164  (01/29/2009)   LDL Direct: Not documented   HDL: 53  (01/29/2009)   Triglycerides: 90  (01/29/2009)    SGOT (AST): 13  (11/27/2008)   SGPT (ALT): 11  (11/27/2008)   Alkaline phosphatase: 117  (11/27/2008)   Total bilirubin: 1.1  (11/27/2008)    Lipid flowsheet reviewed?: Yes   Progress toward LDL goal: Unchanged  Hypertension   Last Blood Pressure: 125 / 75  (06/25/2009)   Serum creatinine: 0.51  (01/29/2009)   BMP action: Ordered   Serum potassium 3.5  (01/29/2009)    Hypertension flowsheet reviewed?: Yes   Progress toward BP goal: At goal  Self-Management Support :   Personal Goals (by the next clinic visit) :      Personal blood pressure goal: 140/90  (01/29/2009)     Personal LDL goal: 130  (01/29/2009)    Patient will work on the following items until the next clinic visit to reach self-care goals:      Medications and monitoring: take my medicines every day, bring all of my medications to every visit  (06/25/2009)     Eating: eat more vegetables, eat baked foods instead of fried foods  (06/25/2009)     Activity: park at the far end of  the parking lot  (06/25/2009)    Hypertension self-management support: Written self-care plan  (06/25/2009)   Hypertension self-care plan printed.    Lipid self-management support: Written self-care plan  (06/25/2009)   Lipid self-care plan printed.   Nursing Instructions: Give Flu vaccine today    Process Orders Check Orders Results:     [facility]: Check successful Tests Sent for requisitioning (June 28, 2009 11:55 AM):     06/26/2009: [facility] -- T-Lipid Profile [id number] (signed)     06/26/2009: [facility] -- T-Basic Metabolic Panel [id number] (signed)     06/26/2009: [facility] -- T-TSH [phone] (signed)     06/26/2009: [facility] -- T-T4, [place] [[id number]] (signed)     Immunizations Administered:  Influenza Vaccine # 1:    Vaccine Type: Fluvax MCR    Site: right deltoid    Mfr: [employer]is    Dose: 0.5 ml    Route: IM    Given by: Angelina Ok RN    Exp. Date: 09/2009    Lot #: [id number]    VIS given: 01/12/07 version given June 25, 2009.  Flu Vaccine Consent Questions:    Do you have a history of severe allergic reactions to this vaccine? no    Any prior history of allergic reactions to egg and/or gelatin? no    Do you have a sensitivity to the preservative Thimersol? no    Do you have a past history of Guillan-Barre Syndrome? no    Do you currently have an acute febrile illness? no    Have you ever had a severe reaction to latex? no    Vaccine information given and explained to patient? yes    Are you currently pregnant? no

## 2010-07-23 NOTE — Progress Notes (Signed)
Summary: water areobics,low impact exercise  Phone Note Call from Patient Call back at Home Phone [phone]   Caller: Patient Call For: Margarito Liner MD Details for Reason: water areobics Summary of Call: MRS.called would liketo know if you write a letter to [facility] to say it is okay for her to participate in water areobic, or very low impact exercises for her health.I will send this to Dr. Meredith Pel. Initial call taken by: Filomena Jungling NT II,  March 02, 2010 4:00 PM  Follow-up for Phone Call        Letter done and signed electronically. Follow-up by: Margarito Liner MD,  March 08, 2010 6:21 PM

## 2010-07-23 NOTE — Letter (Signed)
Summary: [facility]  [facility]   Imported By: Florinda Marker 07/28/2009 15:39:03  _____________________________________________________________________  External Attachment:    Type:   Image     Comment:   External Document

## 2010-07-23 NOTE — Consult Note (Signed)
Summary: [facility]  [facility]   Imported By: Florinda Marker 08/27/2009 10:56:53  _____________________________________________________________________  External Attachment:    Type:   Image     Comment:   External Document

## 2010-07-23 NOTE — Progress Notes (Signed)
  Phone Note Outgoing Call   Call placed by: [facility] NT II,  January 29, 2010 9:09 AM Call placed to: Patient Action Taken: Appt scheduled Details for Reason: fasting labs Summary of Call: [name] is going to try and conme in on Friday August 12, for fasting lab work. Patient is to call back if this day does not work for her. Initial call taken by: [facility] NT II,  January 29, 2010 9:10 AM

## 2010-07-23 NOTE — Consult Note (Signed)
Summary: [facility]  [facility]   Imported By: Florinda Marker 07/24/2009 09:34:03  _____________________________________________________________________  External Attachment:    Type:   Image     Comment:   External Document

## 2010-07-23 NOTE — Assessment & Plan Note (Signed)
Summary: EST-CK/FU/MEDS/CFB   Vital Signs:  Patient profile:   70 year old female Height:      68 inches Weight:      235.0 pounds BMI:     35.86 Temp:     98.5 degrees F oral Pulse rate:   85 / minute BP sitting:   131 / 75  (right arm)  Vitals Entered By: Filomena Jungling NT II (Oct 28, 2009 3:43 PM) CC: checkup Is Patient Diabetic? No Pain Assessment Patient in pain? no      Nutritional Status BMI of > 30 = obese  Have you ever been in a relationship where you felt threatened, hurt or afraid?checkupNo   Does patient need assistance? Functional Status Self care Ambulation Normal   Primary Care Provider:  Margarito Liner MD  CC:  checkup.  History of Present Illness: No C/O.  had shot in R knee last week Patient returns for follow up of her hypertension, hyperlipidemia, and other medical problems.  Her main complaint is chronic bilateral knee pain and right lower extremity pain; since her last visit here, she was referred by her orthopedist Dr. Turner Daniels to [facility] for evaluation of the right lower extremity pain.  She apparently had nerve conduction studies done at [facility] (I do not have the results) and she says that she has an appointment for follow-up there in February.  She has been taking her pravastatin without problems.  She reports that she is compliant with her medications.  Her pain is reasonably well controlled on her current regimen.  Patient also reports an intermittent sensation of something in her right ear canal, but she denies pain, discharge, or hearing loss.  Preventive Screening-Counseling & Management  Alcohol-Tobacco     Smoking Status: quit     Year Quit: 1996     Pack years: 2  Caffeine-Diet-Exercise     Does Patient Exercise: no  Current Medications (verified): 1)  Atenolol 50 Mg Tabs (Atenolol) .... Take 1 Tablet By Mouth Once A Day 2)  Lanoxin 0.25 Mg Tabs (Digoxin) .... Take 1/2 Tablet By Mouth Once A Day 3)   Hydrochlorothiazide 25 Mg Tabs (Hydrochlorothiazide) .... Take 1 Tablet By Mouth Once A Day 4)  Clonidine Hcl 0.1 Mg Tabs (Clonidine Hcl) .... Take 1 Tablet By Mouth Once A Day 5)  Pravachol 10 Mg  Tabs (Pravastatin Sodium) .... Take 1 Tablet By Mouth Once A Day 6)  Potassium Chloride Cr 8 Meq Cr-Tabs (Potassium Chloride) .... Take 1 Tablet By Mouth Once A Day 7)  Meloxicam 7.5 Mg Tabs (Meloxicam) .... Take 1 Tablet By Mouth Two Times A Day As Needed For Pain 8)  Hydrocodone-Acetaminophen 5-500 Mg Tabs (Hydrocodone-Acetaminophen) .... Take 1 Tablet By Mouth Two Times A Day As Needed For Pain 9)  Vitamin D 2000 Unit Caps (Cholecalciferol) .... Take 1 Capsule By Mouth Once A Day  Allergies (verified): 1)  ! Pcn 2)  ! Sulfa  Review of Systems General:  Denies chills, fever, sleep disorder, sweats, and weight loss. CV:  Denies chest pain or discomfort and swelling of feet. Resp:  Denies shortness of breath. GI:  Denies abdominal pain, nausea, and vomiting. MS:  Denies muscle aches. Psych:  Denies anxiety and depression.  Physical Exam  General:  alert, no distress   Lungs:  normal respiratory effort, normal breath sounds, no crackles, and no wheezes.   Heart:  normal rate, regular rhythm, no murmur, no gallop, and no rub.   Abdomen:  soft,  non-tender, and normal bowel sounds.   Extremities:  no edema   Impression & Recommendations:  Problem # 1:  HYPERTENSION (ICD-401.9) Patient's blood pressure is well controlled on current regimen.  Plan is to continue current antihypertensive medications.   Her updated medication list for this problem includes:    Atenolol 50 Mg Tabs (Atenolol) .[name]... Take 1 tablet by mouth once a day    Hydrochlorothiazide 25 Mg Tabs (Hydrochlorothiazide) .[name]... Take 1 tablet by mouth once a day    Clonidine Hcl 0.1 Mg Tabs (Clonidine hcl) .[name]... Take 1 tablet by mouth once a day  BP today: 131/75 Prior BP: 125/75 (06/25/2009)  Prior 10 Yr Risk Heart Disease: 9  % (01/29/2009)  Labs Reviewed: K+: 3.6 (07/18/2009) Creat: : 0.50 (07/18/2009)   Chol: 227 (07/18/2009)   HDL: 51 (07/18/2009)   LDL: 163 (07/18/2009)   TG: 65 (07/18/2009)  Problem # 2:  HYPERLIPIDEMIA (ICD-272.4) Patient is doing well on Pravachol with no apparent problems. The plan is to check a fasting lipid profile within the next week (patient will return for fasting labs).  Her updated medication list for this problem includes:    Pravachol 10 Mg Tabs (Pravastatin sodium) .[name]... Take 1 tablet by mouth once a day  Future Orders: T-Lipid Profile ([id number]) ... 10/29/2009  Labs Reviewed: SGOT: 14 (05/22/2009)   SGPT: 14 (05/22/2009)  Lipid Goals: LDL Goal: 130 (01/29/2009)     Prior 10 Yr Risk Heart Disease: 9 % (01/29/2009)   HDL:51 (07/18/2009), 53 (01/29/2009)  LDL:163 (07/18/2009), 164 (01/29/2009)  Chol:227 (07/18/2009), 235 (01/29/2009)  Trig:65 (07/18/2009), 90 (01/29/2009)  Problem # 3:  HYPOTHYROIDISM, BORDERLINE (ICD-244.9) Patient has no symptoms of thyroid dysfunction. Will check a TSH.  Future Orders: T-TSH [id number]) ... 10/29/2009  Labs Reviewed: TSH: 4.833 (07/18/2009)    HgBA1c: 5.8 (05/22/2009) Chol: 227 (07/18/2009)   HDL: 51 (07/18/2009)   LDL: 163 (07/18/2009)   TG: 65 (07/18/2009)  Problem # 4:  OSTEOPENIA (ICD-733.90) Patient was diagnosed with vitamin D deficiency by neurologist Dr. Terrace Arabia; she has been on supplementation for a few months. The plan is check a vitamin D level.  Future Orders: T-Vitamin D (25-Hydroxy) [id number]) ... 10/29/2009  Problem # 5:  PERIPHERAL NEUROPATHY (ICD-356.9) Patient was evaluated at [facility] by Dr. Terrace Arabia, and on an EMG/nerve conduction study was found to have mild axonal sensorimotor polyneuropathy and also evidence of chronic L5-S1 radiculopathy. An extensive lab workup there which included ESR, C-reactive protein,  protein electrophoresis, RPR, TSH, B12, and vitamin D levels were notable only for  vitamin D deficiency, an elevated TSH of 6.23, and a borderline vitamin B12 of 228.  Dr. Terrace Arabia started patient on oral vitamin D supplementation and an oral vitamin B12 supplement.  Dr. Terrace Arabia recommended followup there in one year, but no other specific therapy.    Future Orders: T-Vitamin B12 ([id number]) ... 10/29/2009  Problem # 6:  DEGENERATIVE JOINT DISEASE (ICD-715.90) Patient is followed by an orthopedic surgeon, and reports having had a knee injection last week because of knee pain; the pain has now improved. Her chronic joint pain is well controlled on her current regimen; will continue as below.  Her updated medication list for this problem includes:    Meloxicam 7.5 Mg Tabs (Meloxicam) .[name]... Take 1 tablet by mouth two times a day as needed for pain    Hydrocodone-acetaminophen 5-500 Mg Tabs (Hydrocodone-acetaminophen) .[name]... Take 1 tablet by mouth two times a day as needed for pain  Complete Medication List: 1)  Atenolol 50 Mg Tabs (Atenolol) .... Take 1 tablet by mouth once a day 2)  Lanoxin 0.25 Mg Tabs (Digoxin) .... Take 1/2 tablet by mouth once a day 3)  Hydrochlorothiazide 25 Mg Tabs (Hydrochlorothiazide) .... Take 1 tablet by mouth once a day 4)  Clonidine Hcl 0.1 Mg Tabs (Clonidine hcl) .... Take 1 tablet by mouth once a day 5)  Pravachol 10 Mg Tabs (Pravastatin sodium) .... Take 1 tablet by mouth once a day 6)  Potassium Chloride Cr 8 Meq Cr-tabs (Potassium chloride) .... Take 1 tablet by mouth once a day 7)  Meloxicam 7.5 Mg Tabs (Meloxicam) .... Take 1 tablet by mouth two times a day as needed for pain 8)  Hydrocodone-acetaminophen 5-500 Mg Tabs (Hydrocodone-acetaminophen) .... Take 1 tablet by mouth two times a day as needed for pain 9)  Vitamin D 2000 Unit Caps (Cholecalciferol) .... Take 1 capsule by mouth once a day  Other Orders: Dermatology Referral (Derma) Future Orders: T-Comprehensive Metabolic Panel [id number]) ... 10/29/2009  Patient Instructions: 1)   Please schedule a follow-up appointment in 3 months. 2)  Please returen for fasting AM labs within 1 week.  Prevention & Chronic Care Immunizations   Influenza vaccine: Fluvax MCR  (06/25/2009)   Influenza vaccine deferral: Deferred  (01/29/2009)   Influenza vaccine due: 02/19/2009    Tetanus booster: Not documented    Pneumococcal vaccine: Pneumovax  (06/03/2005)    H. zoster vaccine: Not documented   H. zoster vaccine deferral: Deferred  (01/29/2009)  Colorectal Screening   Hemoccult: negative x 3  (09/19/2006)   Hemoccult due: 09/2007    Colonoscopy: Results:  One 5mm polyp in the rectum (benign appearing).  Resected and retrieved. Pathology:  Hyperplastic polyp.  Procedure performed by:  Dr. Everardo All. Madilyn Fireman Location:  [facility].     (05/31/2007)  Other Screening   Pap smear: Not documented   Pap smear action/deferral: Not indicated S/P hysterectomy  (01/29/2009)    Mammogram: ASSESSMENT: Negative - BI-RADS 1^MM DIGITAL SCREENING  (10/15/2008)   Mammogram due: 10/15/2009    DXA bone density scan: Left and right femoral neck T- score was -1.6, consistent with osteopenia.  Lumbar spine bone density was normal.  (02/13/2009)   DXA bone density action/deferral: Ordered  (01/29/2009)   Smoking status: quit  (10/28/2009)    Screening comments: Mammogram scheduled for Friday.  Lipids   Total Cholesterol: 227  (07/18/2009)   Lipid panel action/deferral: Lipid Panel ordered   LDL: 163  (07/18/2009)   LDL Direct: Not documented   HDL: 51  (07/18/2009)   Triglycerides: 65  (07/18/2009)    SGOT (AST): 14  (05/22/2009)   BMP action: Ordered   SGPT (ALT): 14  (05/22/2009) CMP ordered    Alkaline phosphatase: 134  (05/22/2009)   Total bilirubin: 0.7  (05/22/2009)    Lipid flowsheet reviewed?: Yes   Progress toward LDL goal: Unchanged  Hypertension   Last Blood Pressure: 131 / 75  (10/28/2009)   Serum creatinine: 0.50  (07/18/2009)   BMP action: Ordered   Serum  potassium 3.6  (07/18/2009) CMP ordered     Hypertension flowsheet reviewed?: Yes   Progress toward BP goal: At goal  Self-Management Support :   Personal Goals (by the next clinic visit) :      Personal blood pressure goal: 140/90  (01/29/2009)     Personal LDL goal: 130  (01/29/2009)    Patient will work on the following items until the next clinic visit to reach  self-care goals:     Medications and monitoring: take my medicines every day, bring all of my medications to every visit  (10/28/2009)     Eating: use fresh or frozen vegetables, eat foods that are low in salt, eat baked foods instead of fried foods  (10/28/2009)     Activity: park at the far end of the parking lot  (10/28/2009)    Hypertension self-management support: Education handout, Resources for patients handout, Written self-care plan  (10/28/2009)   Hypertension self-care plan printed.   Hypertension education handout printed    Lipid self-management support: Education handout, Resources for patients handout, Written self-care plan  (10/28/2009)   Lipid self-care plan printed.   Lipid education handout printed      Resource handout printed. Process Orders Check Orders Results:     [facility]: Check successful Tests Sent for requisitioning (Nov 03, 2009 4:43 PM):     10/29/2009: [facility] -- T-Lipid Profile [phone] (signed)     10/29/2009: [facility] -- T-Comprehensive Metabolic Panel [[id number]] (signed)     10/29/2009: [facility] -- T-Vitamin B12 [phone] (signed)     10/29/2009: [facility] -- T-Vitamin D (25-Hydroxy) [phone] (signed)     10/29/2009: [facility] -- T-TSH [phone] (signed)

## 2010-07-23 NOTE — Assessment & Plan Note (Signed)
Summary: FU VISIT/DS   Vital Signs:  Patient profile:   70 year old female Height:      68 inches Weight:      233.3 pounds BMI:     35.60 Temp:     97.4 degrees F oral Pulse rate:   87 / minute BP sitting:   120 / 73  (right arm)  Vitals Entered By: Filomena Jungling NT II (May 06, 2010 10:12 AM) CC: QUESTIONS ABOUT MEDICINE Is Patient Diabetic? No Pain Assessment Patient in pain? yes     Location: left knee Intensity: 8 Type: aching Nutritional Status BMI of > 30 = obese  Have you ever been in a relationship where you felt threatened, hurt or afraid?No   Does patient need assistance? Functional Status Self care Ambulation Normal   Primary Care Provider:  Margarito Liner MD  CC:  QUESTIONS ABOUT MEDICINE.  History of Present Illness: Patient returns for followup of her leg edema, hypertension, and other chronic medical problems.  She reports that her leg edema is much better; she did not switch from hydrochlorothiazide to furosemide as planned because I apparently did not send the furosemide prescription to the pharmacy. Given the resolution of her leg edema, she requested that she stay on the HCTZ rather than switching to furosemide. She has recently had problems with left knee pain, and saw the PA who works with her orthopedist Dr. Turner Daniels.  She reports that she is compliant with her medications.  Preventive Screening-Counseling & Management  Alcohol-Tobacco     Smoking Status: quit  Current Medications (verified): 1)  Atenolol 50 Mg Tabs (Atenolol) .... Take 1 Tablet By Mouth Once A Day 2)  Lanoxin 0.25 Mg Tabs (Digoxin) .... Take 1/2 Tablet By Mouth Once A Day 3)  Hydrochlorothiazide 25 Mg Tabs (Hydrochlorothiazide) .... Take 1 Tablet By Mouth Once A Day 4)  Clonidine Hcl 0.1 Mg Tabs (Clonidine Hcl) .... Take 1 Tablet By Mouth Once A Day 5)  Pravastatin Sodium 20 Mg Tabs (Pravastatin Sodium) .... Take 1 Tablet By Mouth Once A Day 6)  Potassium Chloride Cr 8 Meq Cr-Tabs  (Potassium Chloride) .... Take 1 Tablet By Mouth Once A Day 7)  Hydrocodone-Acetaminophen 5-500 Mg Tabs (Hydrocodone-Acetaminophen) .... Take 1 Tablet By Mouth Two Times A Day As Needed For Pain 8)  Vitamin D 2000 Unit Caps (Cholecalciferol) .... Take 1 Capsule By Mouth Once A Day 9)  Vitamin B-12 500 Mcg Subl (Cyanocobalamin) .... Take 2 Tablets Under Tongue Once A Day 10)  Voltaren 1 % Gel (Diclofenac Sodium) .... Apply To Skin Over The Affected Area Four Times Daily  Allergies (verified): 1)  ! Pcn 2)  ! Sulfa  Physical Exam  General:  alert, no distress   Lungs:  normal respiratory effort, normal breath sounds, no crackles, and no wheezes.   Heart:  normal rate, regular rhythm, no murmur, no gallop, and no rub.   Abdomen:  soft, non-tender, normal bowel sounds, no hepatomegaly, and no splenomegaly.   Extremities:  trace left pedal edema and trace right pedal edema.     Impression & Recommendations:  Problem # 1:  LEG EDEMA, BILATERAL (ICD-782.3) Patient's lower extremity edema has improved; the plan is to continue HCTZ.  I also discussed with her the possible benefit of compression stockings, and she will consider trying them.  Her updated medication list for this problem includes:    Hydrochlorothiazide 25 Mg Tabs (Hydrochlorothiazide) .[name]... Take 1 tablet by mouth once a day  Problem #  2:  HYPERLIPIDEMIA (ICD-272.4) Patient is doing well on the increased dose of pravastatin, without apparent side effects. The plan is to continue this dose and recheck a fasting lipid panel in a few months.  Her updated medication list for this problem includes:    Pravastatin Sodium 20 Mg Tabs (Pravastatin sodium) .[name]... Take 1 tablet by mouth once a day  Labs Reviewed: SGOT: 13 (03/31/2010)   SGPT: 10 (03/31/2010)  Lipid Goals: LDL Goal: 130 (01/29/2009)     Prior 10 Yr Risk Heart Disease: 9 % (01/29/2009)   HDL:55 (01/30/2010), 51 (07/18/2009)  LDL:144 (01/30/2010), 163 (07/18/2009)   Chol:210 (01/30/2010), 227 (07/18/2009)  Trig:54 (01/30/2010), 65 (07/18/2009)  Problem # 3:  HYPERTENSION (ICD-401.9) Patient's blood pressure is well controlled on current regimen.  Her updated medication list for this problem includes:    Atenolol 50 Mg Tabs (Atenolol) .[name]... Take 1 tablet by mouth once a day    Hydrochlorothiazide 25 Mg Tabs (Hydrochlorothiazide) .[name]... Take 1 tablet by mouth once a day    Clonidine Hcl 0.1 Mg Tabs (Clonidine hcl) .[name]... Take 1 tablet by mouth once a day  BP today: 120/73 Prior BP: 119/78 (03/31/2010)  Prior 10 Yr Risk Heart Disease: 9 % (01/29/2009)  Labs Reviewed: K+: 3.5 (03/31/2010) Creat: : 0.53 (03/31/2010)   Chol: 210 (01/30/2010)   HDL: 55 (01/30/2010)   LDL: 144 (01/30/2010)   TG: 54 (01/30/2010)  Complete Medication List: 1)  Atenolol 50 Mg Tabs (Atenolol) .... Take 1 tablet by mouth once a day 2)  Lanoxin 0.25 Mg Tabs (Digoxin) .... Take 1/2 tablet by mouth once a day 3)  Hydrochlorothiazide 25 Mg Tabs (Hydrochlorothiazide) .... Take 1 tablet by mouth once a day 4)  Clonidine Hcl 0.1 Mg Tabs (Clonidine hcl) .... Take 1 tablet by mouth once a day 5)  Pravastatin Sodium 20 Mg Tabs (Pravastatin sodium) .... Take 1 tablet by mouth once a day 6)  Potassium Chloride Cr 8 Meq Cr-tabs (Potassium chloride) .... Take 1 tablet by mouth once a day 7)  Hydrocodone-acetaminophen 5-500 Mg Tabs (Hydrocodone-acetaminophen) .... Take 1 tablet by mouth two times a day as needed for pain 8)  Vitamin D 2000 Unit Caps (Cholecalciferol) .... Take 1 capsule by mouth once a day 9)  Vitamin B-12 500 Mcg Subl (Cyanocobalamin) .... Take 2 tablets under tongue once a day 10)  Voltaren 1 % Gel (Diclofenac sodium) .... Apply to skin over the affected area four times daily  Patient Instructions: 1)  Please schedule a follow-up appointment in 3 months.  Prescriptions: LANOXIN 0.25 MG TABS (DIGOXIN) Take 1/2 tablet by mouth once a day  #15 Tablet x 6   Entered and  Authorized by:   Margarito Liner MD   Signed by:   Margarito Liner MD on 05/07/2010   Method used:   Electronically to        [facility] [phone]* (retail)       [address]       [place], [place]  [address]       Ph: [phone] or [phone]       Fax: [phone]   RxID:   [id number] ATENOLOL 50 MG TABS (ATENOLOL) Take 1 tablet by mouth once a day  #31 Tablet x 6   Entered and Authorized by:   Margarito Liner MD   Signed by:   Margarito Liner MD on 05/07/2010   Method used:   Electronically to  [facility] [address] [phone]* (retail)       [address]       [place], [place]  [address]       Ph: [phone] or [phone]       Fax: [phone]   RxID:   [id number]    Orders Added: 1)  Est. Patient Level III [36644]    Prevention & Chronic Care Immunizations   Influenza vaccine: Fluvax MCR  (03/31/2010)   Influenza vaccine deferral: Deferred  (01/29/2009)   Influenza vaccine due: 02/19/2009    Tetanus booster: 03/31/2010: Tdap    Pneumococcal vaccine: Pneumovax  (06/03/2005)    H. zoster vaccine: Not documented   H. zoster vaccine deferral: Deferred  (01/29/2009)  Colorectal Screening   Hemoccult: negative x 3  (09/19/2006)   Hemoccult due: 09/2007    Colonoscopy: Results:  One 5mm polyp in the rectum (benign appearing).  Resected and retrieved. Pathology:  Hyperplastic polyp.  Procedure performed by:  Dr. Everardo All. Madilyn Fireman Location:  [facility].     (05/31/2007)  Other Screening   Pap smear: Not documented   Pap smear action/deferral: Not indicated S/P hysterectomy  (01/29/2009)    Mammogram: ASSESSMENT: Negative - BI-RADS 1^MM DIGITAL SCREENING  (10/31/2009)   Mammogram due: 10/15/2009    DXA bone density scan: Left and right femoral neck T- score was -1.6, consistent with osteopenia.  Lumbar spine bone density was normal.  (02/13/2009)   DXA bone density action/deferral: Ordered   (01/29/2009)   Smoking status: quit  (05/06/2010)  Lipids   Total Cholesterol: 210  (01/30/2010)   Lipid panel action/deferral: Lipid Panel ordered   LDL: 144  (01/30/2010)   LDL Direct: Not documented   HDL: 55  (01/30/2010)   Triglycerides: 54  (01/30/2010)    SGOT (AST): 13  (03/31/2010)   BMP action: Ordered   SGPT (ALT): 10  (03/31/2010)   Alkaline phosphatase: 141  (03/31/2010)   Total bilirubin: 0.7  (03/31/2010)    Lipid flowsheet reviewed?: Yes   Progress toward LDL goal: Unchanged  Hypertension   Last Blood Pressure: 120 / 73  (05/06/2010)   Serum creatinine: 0.53  (03/31/2010)   BMP action: Ordered   Serum potassium 3.5  (03/31/2010)    Hypertension flowsheet reviewed?: Yes   Progress toward BP goal: At goal  Self-Management Support :   Personal Goals (by the next clinic visit) :      Personal blood pressure goal: 140/90  (01/29/2009)     Personal LDL goal: 130  (01/29/2009)    Patient will work on the following items until the next clinic visit to reach self-care goals:     Medications and monitoring: take my medicines every day, bring all of my medications to every visit  (05/06/2010)     Eating: drink diet soda or water instead of juice or soda, eat more vegetables, use fresh or frozen vegetables, eat foods that are low in salt, eat baked foods instead of fried foods, eat fruit for snacks and desserts  (05/06/2010)     Activity: park at the far end of the parking lot  (05/06/2010)    Hypertension self-management support: Education handout, Resources for patients handout  (05/06/2010)   Hypertension education handout printed    Lipid self-management support: Education handout, Resources for patients handout  (05/06/2010)     Lipid education handout printed      Resource handout printed.

## 2010-07-23 NOTE — Letter (Signed)
Summary: Generic Letter  [facility]  [address]   [place], [place] [address]   Phone: [phone]  Fax: [phone]    03/08/2010  [name] [address] [place], [place]  [address]  Dear [name],  I am writing at your request to confirm that in my opinion it is OK for you to participate in water aerobics or very low impact exercises.  Sincerely,   Margarito Liner MD

## 2010-07-23 NOTE — Assessment & Plan Note (Signed)
Summary: EST-ROUTINE CHECKUP/CH   Vital Signs:  Patient profile:   70 year old female Height:      68 inches Weight:      232.3 pounds BMI:     35.45 Temp:     98.0 degrees F oral Pulse rate:   72 / minute BP sitting:   119 / 78  (right arm)  Vitals Entered By: Filomena Jungling NT II (March 31, 2010 3:11 PM) CC: checkup  Is Patient Diabetic? No Pain Assessment Patient in pain? no      Nutritional Status BMI of > 30 = obese  Have you ever been in a relationship where you felt threatened, hurt or afraid?No   Does patient need assistance? Functional Status Self care Ambulation Normal   Primary Care Provider:  Margarito Liner MD  CC:  checkup .  History of Present Illness: Patient returns for followup of her hypertension, hyperlipidemia, and other chronic medical problems; she also complains of swelling in her feet for the past few months, worse when she has been up on her feet. She reports some right leg pain, but this has improved since her last visit. Overall she is doing well. She stopped taking meloxicam over one month ago because of concerns about potential side effects; she was not having any evident side effects when she stopped the meloxicam. Otherwise she reports that she is compliant with her medications.  Preventive Screening-Counseling & Management  Alcohol-Tobacco     Smoking Status: quit     Year Quit: 1996     Pack years: 2  Caffeine-Diet-Exercise     Does Patient Exercise: no  Current Medications (verified): 1)  Atenolol 50 Mg Tabs (Atenolol) .... Take 1 Tablet By Mouth Once A Day 2)  Lanoxin 0.25 Mg Tabs (Digoxin) .... Take 1/2 Tablet By Mouth Once A Day 3)  Hydrochlorothiazide 25 Mg Tabs (Hydrochlorothiazide) .... Take 1 Tablet By Mouth Once A Day 4)  Clonidine Hcl 0.1 Mg Tabs (Clonidine Hcl) .... Take 1 Tablet By Mouth Once A Day 5)  Pravachol 10 Mg  Tabs (Pravastatin Sodium) .... Take 1 Tablet By Mouth Once A Day 6)  Potassium Chloride Cr 8 Meq Cr-Tabs  (Potassium Chloride) .... Take 1 Tablet By Mouth Once A Day 7)  Hydrocodone-Acetaminophen 5-500 Mg Tabs (Hydrocodone-Acetaminophen) .... Take 1 Tablet By Mouth Two Times A Day As Needed For Pain 8)  Vitamin D 2000 Unit Caps (Cholecalciferol) .... Take 1 Capsule By Mouth Once A Day 9)  Vitamin B-12 500 Mcg Subl (Cyanocobalamin) .... Take 2 Tablets Under Tongue Once A Day  Allergies (verified): 1)  ! Pcn 2)  ! Sulfa  Past History:  Past Medical History: Headache-hx chronic Hyperlipidemia Hypertension Low back pain- chronic Chronic right knee pain Abnormal electromyogram-low grade right S1 radiculopathy 7/93 Hot flashes Right shoulder pain Iron deficiency anemia Cardiac catherterization- 6/92 Subtotal abdominal hsterectomy - [facility]- 8/92 Cholecystectomy - laparoscopic -1998 Supraventricular tachycardia- hx of Herniated lumbar disc-S/P L4-5,L5-S1 surgery Dr. Channing Mutters Degerative joint disorder  Review of Systems General:  Denies chills, fever, loss of appetite, sleep disorder, and weight loss. CV:  Complains of swelling of feet; denies chest pain or discomfort, difficulty breathing at night, and difficulty breathing while lying down. Resp:  Denies shortness of breath. GI:  Denies abdominal pain, bloody stools, nausea, and vomiting. GU:  Denies dysuria and urinary frequency. MS:  Denies muscle aches.  Physical Exam  General:  alert, no distress   Lungs:  normal respiratory effort, normal breath sounds,  no crackles, and no wheezes.   Heart:  normal rate, regular rhythm, no murmur, no gallop, and no rub.   Abdomen:  soft, non-tender, and normal bowel sounds.   Extremities:  1+ left pedal edema and 1+ right pedal edema.     Impression & Recommendations:  Problem # 1:  HYPERTENSION (ICD-401.9) Patient's blood pressure is well controlled on current regimen; will continue as below.  Her updated medication list for this problem includes:    Atenolol 50 Mg Tabs (Atenolol) .[name]... Take 1  tablet by mouth once a day    Furosemide 20 Mg Tabs (Furosemide) .[name]... Take 1 tablet by mouth once a day    Clonidine Hcl 0.1 Mg Tabs (Clonidine hcl) .[name]... Take 1 tablet by mouth once a day  BP today: 119/78 Prior BP: 131/75 (10/28/2009)  Prior 10 Yr Risk Heart Disease: 9 % (01/29/2009)  Labs Reviewed: K+: 3.6 (01/30/2010) Creat: : 0.53 (01/30/2010)   Chol: 210 (01/30/2010)   HDL: 55 (01/30/2010)   LDL: 144 (01/30/2010)   TG: 54 (01/30/2010)  Problem # 2:  HYPERLIPIDEMIA (ICD-272.4) Patient is taking pravastatin without problems, but her last LDL was above goal. The plan is to increase pravastatin to a dose of 20 mg daily.  Her updated medication list for this problem includes:    Pravastatin Sodium 20 Mg Tabs (Pravastatin sodium) .[name]... Take 1 tablet by mouth once a day  Labs Reviewed: SGOT: 13 (01/30/2010)   SGPT: 12 (01/30/2010)  Lipid Goals: LDL Goal: 130 (01/29/2009)     Prior 10 Yr Risk Heart Disease: 9 % (01/29/2009)   HDL:55 (01/30/2010), 51 (07/18/2009)  LDL:144 (01/30/2010), 163 (07/18/2009)  Chol:210 (01/30/2010), 227 (07/18/2009)  Trig:54 (01/30/2010), 65 (07/18/2009)  Problem # 3:  HYPOTHYROIDISM, BORDERLINE (ICD-244.9) Patient does not have overt symptoms of thyroid dysfunction.  The plan is to check a TSH and free T4 today.  Orders: T-TSH [phone]) T-T4, Free [phone])  Labs Reviewed: TSH: 2.443 (01/30/2010)    HgBA1c: 5.8 (05/22/2009) Chol: 210 (01/30/2010)   HDL: 55 (01/30/2010)   LDL: 144 (01/30/2010)   TG: 54 (01/30/2010)  Problem # 4:  LEG EDEMA, BILATERAL (ICD-782.3) Patient has had bilateral leg edema in the past; a 2-D echocardiogram was done in September of 2008 and showed normal left ventricular systolic function as well as findings suggestive of diastolic dysfunction. She has no symptoms of left heart failure. The plan is to stop hydrochlorothiazide and start furosemide 20 mg daily. I advised her to weigh daily and to call if her weight  changes more than 4 pounds from baseline.  Her updated medication list for this problem includes:    Furosemide 20 Mg Tabs (Furosemide) .[name]... Take 1 tablet by mouth once a day  Orders: T-CMP with Estimated GFR ([id number])  Complete Medication List: 1)  Atenolol 50 Mg Tabs (Atenolol) .... Take 1 tablet by mouth once a day 2)  Lanoxin 0.25 Mg Tabs (Digoxin) .... Take 1/2 tablet by mouth once a day 3)  Furosemide 20 Mg Tabs (Furosemide) .... Take 1 tablet by mouth once a day 4)  Clonidine Hcl 0.1 Mg Tabs (Clonidine hcl) .... Take 1 tablet by mouth once a day 5)  Pravastatin Sodium 20 Mg Tabs (Pravastatin sodium) .... Take 1 tablet by mouth once a day 6)  Potassium Chloride Cr 8 Meq Cr-tabs (Potassium chloride) .... Take 1 tablet by mouth once a day 7)  Hydrocodone-acetaminophen 5-500 Mg Tabs (Hydrocodone-acetaminophen) .... Take 1 tablet by mouth two times a day as  needed for pain 8)  Vitamin D 2000 Unit Caps (Cholecalciferol) .... Take 1 capsule by mouth once a day 9)  Vitamin B-12 500 Mcg Subl (Cyanocobalamin) .... Take 2 tablets under tongue once a day  Other Orders: T-Digoxin ([id number]) Influenza Vaccine MCR ([id number]) Tdap => 87yrs IM ([id number]) Admin 1st Vaccine ([id number])   Patient Instructions: 1)  Please schedule a follow-up appointment in 1 months. 2)  Increase pravastatin to 20 mg once daily. 3)  Stop hydrochlorothiazide (HCTZ). 4)  Start furosemide (Lasix) 20mg  one tablet daily.  Prevention & Chronic Care Immunizations   Influenza vaccine: Fluvax MCR  (03/31/2010)   Influenza vaccine deferral: Deferred  (01/29/2009)   Influenza vaccine due: 02/19/2009    Tetanus booster: 03/31/2010: Tdap    Pneumococcal vaccine: Pneumovax  (06/03/2005)    H. zoster vaccine: Not documented   H. zoster vaccine deferral: Deferred  (01/29/2009)  Colorectal Screening   Hemoccult: negative x 3  (09/19/2006)   Hemoccult due: 09/2007    Colonoscopy: Results:  One 5mm polyp in the rectum  (benign appearing).  Resected and retrieved. Pathology:  Hyperplastic polyp.  Procedure performed by:  Dr. Everardo All. Madilyn Fireman Location:  [facility].     (05/31/2007)  Other Screening   Pap smear: Not documented   Pap smear action/deferral: Not indicated S/P hysterectomy  (01/29/2009)    Mammogram: ASSESSMENT: Negative - BI-RADS 1^MM DIGITAL SCREENING  (10/31/2009)   Mammogram due: 10/15/2009    DXA bone density scan: Left and right femoral neck T- score was -1.6, consistent with osteopenia.  Lumbar spine bone density was normal.  (02/13/2009)   DXA bone density action/deferral: Ordered  (01/29/2009)   Smoking status: quit  (03/31/2010)  Lipids   Total Cholesterol: 210  (01/30/2010)   Lipid panel action/deferral: Lipid Panel ordered   LDL: 144  (01/30/2010)   LDL Direct: Not documented   HDL: 55  (01/30/2010)   Triglycerides: 54  (01/30/2010)    SGOT (AST): 13  (01/30/2010)   BMP action: Ordered   SGPT (ALT): 12  (01/30/2010)   Alkaline phosphatase: 112  (01/30/2010)   Total bilirubin: 0.9  (01/30/2010)    Lipid flowsheet reviewed?: Yes   Progress toward LDL goal: Unchanged  Hypertension   Last Blood Pressure: 119 / 78  (03/31/2010)   Serum creatinine: 0.53  (01/30/2010)   BMP action: Ordered   Serum potassium 3.6  (01/30/2010)    Hypertension flowsheet reviewed?: Yes   Progress toward BP goal: At goal  Self-Management Support :   Personal Goals (by the next clinic visit) :      Personal blood pressure goal: 140/90  (01/29/2009)     Personal LDL goal: 130  (01/29/2009)    Patient will work on the following items until the next clinic visit to reach self-care goals:     Medications and monitoring: take my medicines every day, bring all of my medications to every visit  (03/31/2010)     Eating: drink diet soda or water instead of juice or soda, eat more vegetables, use fresh or frozen vegetables, eat foods that are low in salt, eat baked foods instead of fried foods,  eat fruit for snacks and desserts  (03/31/2010)     Activity: park at the far end of the parking lot  (10/28/2009)    Hypertension self-management support: Education handout, Resources for patients handout, Written self-care plan  (03/31/2010)   Hypertension self-care plan printed.   Hypertension education handout printed    Lipid self-management support:  Education handout, Resources for patients handout, Written self-care plan  (03/31/2010)   Lipid self-care plan printed.   Lipid education handout printed      Resource handout printed.   Nursing Instructions: Give Flu vaccine today Give tetanus booster today    Process Orders Check Orders Results:     [facility]: Check successful Tests Sent for requisitioning (April 03, 2010 6:05 PM):     03/31/2010: [facility] -- T-CMP with Estimated GFR [[id number]] (signed)     03/31/2010: [facility] -- T-TSH [phone] (signed)     03/31/2010: [facility] -- T-T4, [place] [[id number]] (signed)     03/31/2010: [facility] -- T-Digoxin [phone] (signed)     Immunizations Administered:  Influenza Vaccine # 1:    Vaccine Type: Fluvax MCR    Site: right deltoid    Mfr: Technical brewer    Dose: 0.5 ml    Route: IM    Given by: Angelina Ok RN    Exp. Date: 12/19/2010    Lot #: [id number]    VIS given: 01/13/10 version given March 31, 2010.  Tetanus Vaccine:    Vaccine Type: Tdap    Site: left deltoid    Mfr: GlaxoSmithKline    Dose: 0.5 ml    Route: IM    Given by: Angelina Ok RN    Exp. Date: 03/11/2012    Lot #: [id number]    VIS given: 05/08/08 version given March 31, 2010.  Flu Vaccine Consent Questions:    Do you have a history of severe allergic reactions to this vaccine? no    Any prior history of allergic reactions to egg and/or gelatin? no    Do you have a sensitivity to the preservative Thimersol? no    Do you have a  past history of Guillan-Barre Syndrome? no    Do you currently have an acute febrile illness? no    Have you ever had a severe reaction to latex? no    Vaccine information given and explained to patient? yes    Are you currently pregnant? no

## 2010-09-02 ENCOUNTER — Other Ambulatory Visit: Payer: Self-pay | Admitting: Internal Medicine

## 2010-10-07 ENCOUNTER — Ambulatory Visit (INDEPENDENT_AMBULATORY_CARE_PROVIDER_SITE_OTHER): Payer: PRIVATE HEALTH INSURANCE | Admitting: Internal Medicine

## 2010-10-07 ENCOUNTER — Encounter: Payer: Self-pay | Admitting: Internal Medicine

## 2010-10-07 VITALS — BP 144/84 | HR 73 | Temp 98.2°F | Ht 68.0 in | Wt 227.8 lb

## 2010-10-07 DIAGNOSIS — M79604 Pain in right leg: Secondary | ICD-10-CM | POA: Insufficient documentation

## 2010-10-07 DIAGNOSIS — E876 Hypokalemia: Secondary | ICD-10-CM

## 2010-10-07 DIAGNOSIS — E785 Hyperlipidemia, unspecified: Secondary | ICD-10-CM

## 2010-10-07 DIAGNOSIS — R609 Edema, unspecified: Secondary | ICD-10-CM

## 2010-10-07 DIAGNOSIS — I10 Essential (primary) hypertension: Secondary | ICD-10-CM

## 2010-10-07 DIAGNOSIS — M79609 Pain in unspecified limb: Secondary | ICD-10-CM

## 2010-10-07 LAB — COMPREHENSIVE METABOLIC PANEL
ALT: 11 U/L (ref 0–35)
AST: 13 U/L (ref 0–37)
Albumin: 4.3 g/dL (ref 3.5–5.2)
BUN: 14 mg/dL (ref 6–23)
Calcium: 9.9 mg/dL (ref 8.4–10.5)
Chloride: 104 mEq/L (ref 96–112)
Potassium: 3.5 mEq/L (ref 3.5–5.3)
Total Protein: 6.3 g/dL (ref 6.0–8.3)

## 2010-10-07 NOTE — Assessment & Plan Note (Signed)
Lab Results  Component Value Date   NA 141 03/31/2010   K 3.5 03/31/2010   CL 100 03/31/2010   CO2 30 03/31/2010   BUN 9 03/31/2010   CREATININE 0.53 03/31/2010    BP Readings from Last 3 Encounters:  10/07/10 144/84  05/06/10 120/73  03/31/10 119/78    Assessment: Hypertension control:  mildly elevated Patient did not take her medication this AM.  Progress toward goals:  unchanged Barriers to meeting goals:  no barriers identified  Plan: Hypertension treatment:  continue current medications; check a metabolic panel.

## 2010-10-07 NOTE — Assessment & Plan Note (Addendum)
Patient has mild chronic bilateral ankle edema, which has recently been somewhat increased.  I considered changing her from hydrochlorothiazide to furosemide, but I am somewhat reluctant to do this because of her history of sulfa allergy; it is true that this might also create a problem with the hydrochlorothiazide, but she has been taking hydrochlorothiazide without any apparent problems.  The plan for now is to continue hydrochlorothiazide at current dose.  I will also check a comprehensive metabolic panel today.

## 2010-10-07 NOTE — Progress Notes (Signed)
  Subjective:    Patient ID: [name], female    DOB: 04/06/1941, 70 y.o.   MRN: [id number]  HPI Patient returns for follow-up of her hypertension, hyperlipidemia, bilateral lower extremity edema, and other chronic problems.  Today she reports occasional local pain on the lateral side of her right leg that she notices more when she has been walking; this is a focal area about halfway between the knee and ankle.  She has chronic mild bilateral ankle swelling which has been somewhat worse recently.  Otherwise she is doing well.  Patient reports that she reduced her pravastatin dose from 20 mg daily to her previous dose of 10 mg daily because of discomfort in her right foot that she attributed to the higher dose of pravastatin; she says that this feeling went away about one week after reducing the dose of pravastatin.  She has been on the 10 mg dose of pravastatin for about one month.  Otherwise she reports that she is compliant with her medications, but she has not yet taken her medications today, including her blood pressure medicines.  Review of Systems  Constitutional: Negative for fever, chills, diaphoresis and appetite change.  Respiratory: Negative for chest tightness, shortness of breath and wheezing.   Cardiovascular: Positive for leg swelling (Bilateral ankle swelling.). Negative for chest pain.  Gastrointestinal: Negative for nausea, vomiting, abdominal pain, diarrhea, blood in stool and anal bleeding.  Genitourinary: Negative for dysuria, urgency, frequency and difficulty urinating.  Musculoskeletal: Negative for back pain.  Neurological: Negative for dizziness and syncope.       Objective:   Physical Exam  Constitutional: No distress.  Cardiovascular: Normal rate, regular rhythm and normal heart sounds.  Exam reveals no gallop and no friction rub.   No murmur heard. Pulmonary/Chest: Effort normal and breath sounds normal. No respiratory distress. She has no wheezes. She has no  rales.  Abdominal: Soft. Bowel sounds are normal. She exhibits no distension. There is no tenderness. There is no rebound and no guarding.  Musculoskeletal: She exhibits edema (1+ bilateral ankle edema.). She exhibits no tenderness.  Skin: No erythema.          Assessment & Plan:

## 2010-10-07 NOTE — Assessment & Plan Note (Signed)
Lipids:    Component Value Date/Time   CHOL 210* 01/30/2010 1845   TRIG 54 01/30/2010 1845   HDL 55 01/30/2010 1845   LDLCALC 144* 01/30/2010 1845   VLDL 11 01/30/2010 1845   CHOLHDL 3.8 Ratio 01/30/2010 1845    As noted in the history of present illness, patient reduced her pravastatin dose from 20 mg daily to a dose of 10 mg daily because of perceived discomfort in her right foot that she attributed to the pravastatin.  I do not think it is likely that pravastatin was causing that side effect.  However, the plan is to continue pravastatin 10 mg daily, and recheck a fasting lipid panel in 2-3 months.  If her LDL is above goal at that time, we'll consider changing to an alternative statin.

## 2010-10-07 NOTE — Assessment & Plan Note (Signed)
This is likely due to the patient's diuretic therapy; will check a metabolic panel today.

## 2010-10-07 NOTE — Patient Instructions (Signed)
Return in 1 month

## 2010-11-06 NOTE — H&P (Signed)
[place]  Patient:    [name], [name]                        MRN: [id number] Adm. Date:  11/01/00 Attending:  Payton Doughty, M.D.                         History and Physical  ADMITTING DIAGNOSIS:  Herniated disk L4-5 and L5-S1 with right L5 and S1 radiculopathies.  SERVICE:  Neurosurgery.  HISTORY OF PRESENT ILLNESS:  A 70 year old right-handed black lady in July began having back and right lower extremity pain.  She visited Dr. Luiz Blare. MRI showed a herniated disk at L4-5 and L5-S1.  She had epidural steroids, did not do very much for her, and was referred to a neurosurgeon who for financial reasons was unable to see her and I visited with her through the outpatient clinic at [facility].  PAST MEDICAL HISTORY/MEDICATIONS:  Remarkable for SVT for which she is on Lanoxin 0.25 mg a day, history of hypertension for which she is 50 mg of atenolol.  She takes Estradiol 1 mg a day.   She had some food poisoning and got over that with ciprofloxacin.  She also uses Celebrex 200 b.i.d.  SURGICAL HISTORY:  Remarkable for hysterectomy in 1992, cholecystectomy 1998, tubal ligation 1972, bone spurs in her foot in 1995 and 2000.  ALLERGIES:  PENICILLIN.  SOCIAL HISTORY:  She does not smoke and does not drink.  She is currently not working but would like to get back to the work force and is waiting to hear from [place].  FAMILY HISTORY:  Mom is deceased at 63 year of age from hypertension and had ovarian cancer.  Dad is deceased of emphysema.  REVIEW OF SYSTEMS:  Remarkable for tinnitus, sinus problems, hypertension, heart murmur, swelling in her hands and feet, leg pain while she walks, joint pain, arthritis, and leg pain.  PHYSICAL EXAMINATION:  HEENT:  Within normal limits.  NECK:  She has reasonable range of motion of her neck.  CHEST:  Clear.  CARDIAC:  Regular rhythm with a 1/6 systolic murmur.  ABDOMEN:  Nontender, no hepatosplenomegaly,  although it is somewhat large.  EXTREMITIES:  Without clubbing and cyanosis.  GENITOURINARY:  Deferred.  PERIPHERAL PULSES:  Good.  NEUROLOGIC:  She is awake, alert, and oriented.  Her cranial nerves are intact.  Motor exam shows 5/5 strength throughout the upper and lower extremities save for the dorsiflexors of the right foot, which are 4/5.  She has a right L5 and S1 sensory deficit.  Reflexes are 1 at the knees, absent at the right ankle, and 1 at the left ankle.  Straight leg raise on the right is positive, on the left not positive.  LABORATORY DATA:  She comes accompanied with an MRI that shows a disk above 4-5 and 5-1 on the right, inferiorly migrated fragment in both levels, and compression in both the 5 and 1 roots.  CLINICAL IMPRESSION:  Right L5 and S1 radiculopathies related to herniated disk.  Studies are a bit old but she does not have any money and does not have any insurance, is miserable with her leg.  I am uncomfortable with operating on her based on the studies from this long ago but symptoms are unchanged and her exam fits exactly with pathology seen on the MRI.  PLAN:  Lumbar laminectomy and diskectomy at L4-5 and L5-S1.  The risks  and benefits of this approach have been discussed with her and wished to proceed. DD:  11/01/00 TD:  11/01/00 Job: [id number]

## 2010-11-06 NOTE — Procedures (Signed)
[facility]  Patient:    [name], [name]                       MRN: [id number] Proc. Date: 05/19/00 Adm. Date:  [id number] Attending:  Thyra Breed CC:         Harvie Junior, M.D.  Farley Ly, M.D., [facility]   Procedure Report  PROCEDURE:  Lumbar epidural steroid injection.  DIAGNOSIS:  Herniated nucleus pulposus at 4-5, 5-S1, with lumbar radiculopathy and underlying lumbar spondylosis.  HISTORY:  The patient is a very pleasant 70 year old who was in her usual state of health up until July 2001, when she spontaneously developed low back pain radiating out into her right lower extremity.  She described the pain as an achy-type discomfort.  It is made worse by standing and walking and improved by Darvocet and Celebrex.  She had been on a prednisone dosepak, which mildly improved her discomfort.  Rest will help to some extent.  She described numbness and tingling along the posterior lateral aspect of the lower extremity and out into the foot.  She also notes intermittent weakness of the right lower extremity.  She denied any bowel or bladder incontinence. She has had an MRI scan performed on March 11, 2000, which demonstrated central canal stenosis at 3-4, 4-5, with bilateral disk protrusions at 3-4, herniated disk at 4-5, and paracentral to the right at 5-S1 with migration of nuclear material on the right at L5-S1 with mass effect on the right S1 nerve root.  MEDICATIONS:  Current medications are Darvocet, digoxin, and atenolol.  ALLERGIES:  PENICILLIN, characterized by swelling.  FAMILY HISTORY:  Positive for hypertension and ovarian cancer.  SOCIAL HISTORY:  The patient is a nonsmoker, nondrinker.  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  PAST SURGICAL HISTORY:  Significant for hysterectomy, cholecystectomy, and bilateral tubal ligation.  ACTIVE MEDICAL PROBLEMS:  The patient has what sounds  like a supraventricular tachycardia, for which she is on digoxin; hypertension; and recurrent sinus problems.  Her primary care physician is Dr. Meredith Pel over at [facility].  REVIEW OF SYSTEMS:  GENERAL:  Negative.  HEAD:  Significant for sinus-type headaches.  EYES:  Significant for reading glasses.  NOSE, MOUTH, THROAT:  See under head.  EARS:  Negative.  PULMONARY:  Negative.  CARDIOVASCULAR:  See active medical problems.  GASTROINTESTINAL/GENITOURINARY:  Negative. MUSCULOSKELETAL:  The patient has bilateral knee discomfort and foot discomfort in addition to her lower back problems.  NEUROLOGIC:  No history of seizure or stroke-like symptoms.  See HPI for pertinent positives. HEMATOLOGIC:  Negative.  CUTANEOUS:  Negative.  ENDOCRINE:  Negative. PSYCHIATRIC:  Negative.  ALLERGY/IMMUNOLOGIC:  Negative.  PHYSICAL EXAMINATION:  VITAL SIGNS:  Blood pressure is 130/79, heart rate 68, respiratory rate 20, O2 saturation 97%, pain level is 6 out of 10.  GENERAL:  This is a very pleasant, well-focused, appropriate female in no acute distress.  HEENT:  Normocephalic, atraumatic.  Eyes:  Extraocular movements intact with conjunctivae and sclerae clear.  Nose:  Patent nares without discharge. Oropharynx demonstrates dental plates with mucosa intact.  NECK:  Supple without lymphadenopathy.  Carotids were 2+ and symmetric without bruits.  CHEST:  Lungs were clear.  CARDIAC:  Heart was a regular rate and rhythm.  BREASTS, ABDOMEN, PELVIC, RECTAL:  Not performed.  MUSCULOSKELETAL:  Back exam revealed minimal tenderness to palpation over the vertebrae with no tenderness over the SI joints.  Extremities:  Upper extremities demonstrated full range of motion of the joints with no edema. Radial pulses were 2+ and symmetric.  Lower extremities demonstrated dorsalis pedis pulses 2+ and symmetric with pes planus and genu valgus.  NEUROLOGIC:  The patient was oriented x 4.  Cranial nerves II-XII were  grossly intact.  Deep tendon reflexes were symmetric in the upper extremities, 2+ at the knees, absent at the right ankle, 2+ at the left ankle, with downgoing toes.  Motor was 5/5 with symmetric bulk and tone.  Coordination was grossly intact.  Sensory exam revealed attenuated pinprick over the lateral calf and sole of her right foot.  IMPRESSION: 1. Lumbar radiculopathy, predominantly S1, with absent right ankle jerk, with    underlying herniated disk superimposed on lumbar spondylosis at 4-5, 5-S1. 2. Other medical problems per primary care physician, including recurrent    sinusitis, osteoarthritis in the knees, arrhythmia which sounds like    supraventricular tachycardia, and hypertension.  DESCRIPTION OF PROCEDURE:  After informed consent was obtained, the patient was placed in the sitting position and monitored.  Her back was prepped with Betadine x 3.  A skin wheal was raised at the L4-5 interspace with 1% lidocaine.  A 20-gauge Tuohy needle was introduced to the lumbar epidural space and loss of resistance to preservative-free normal saline.  The depth was 6.5 cm.  There was no CSF nor blood.  Medrol 80 mg in 10 ml of preservative-free normal saline was gently injected.  The needle was flushed with preservative-free normal saline and removed intact.  POSTPROCEDURE CONDITION:  Stable.  DISCHARGE INSTRUCTIONS: 1. Resume previous diet. 2. Limitation on activities per instruction sheet. 3. Continue on current medications. 4. Follow up with me in one to two weeks for repeat injection. DD:  05/19/00 TD:  05/19/00 Job: [id number]

## 2010-11-06 NOTE — Op Note (Signed)
[place]  Patient:    [name], [name]                       MRN: [id number] Proc. Date: 11/01/00 Adm. Date:  [id number] Attending:  Emeterio Reeve                           Operative Report  PREOPERATIVE DIAGNOSES:  Herniated disk and spondylosis at L4-5 and L5-S1 on the right.  POSTOPERATIVE DIAGNOSES:  Herniated disk and spondylosis at L4-5 and L5-S1 on the right.  PROCEDURES:  L4-5, L5-S1 laminotomy, foraminotomy and L5-S1 diskectomy.  SERVICE:  Neurosurgery.  ANESTHESIA:  General endotracheal.   PREP:  Sterile Betadine prep with alcohol wipe.  COMPLICATIONS:  There was a question of a CSF leak.  DESCRIPTION OF PROCEDURE:  This is a 70 year old right-handed black lady with severe spondylosis and disks at L5-S1 and L4-5.  She was taken to the operating room smoothly anesthetized and intubated and placed prone on the operating table. Following this she was shaved, prepped and draped in the usual sterile fashion. The skin was infiltrated with 1% lidocaine with 1:400,000 epinephrine.  The skin was incised from the top of S1 to the top of L4, and the lamina of L4, L5, and S1 were exposed on the right side in a subperiosteal plane.  Intraoperative x-ray confirmed the correct levels. Hemilaminectomy of L5 was undertaken with a foraminotomy carried out over the L5 and the S1 nerve roots.  The S1 nerve root was retracted medially, and diskectomy carried out.  There was some free fragment removed without difficulty.  There was a question of a CSF leak and a thrombin-soaked Gelfoam was placed over the construct but no further leak could be demonstrated.  At L4-5 a laminotomy-foraminotomy was carried out with the foraminotomy continued over the L5 root as well as over the L4 root.  The nerve roots were carefully explored.  The disk was found to be flat and intact.  Because of this, the diskectomy was not carried out at this level.  The wound  was irrigated and hemostasis assured.  The laminectomy defects were covered with thrombin-soaked Gelfoam.  The fascia was reapproximated with #0 Vicryl in an interrupted fashion.  The subcutaneous tissues were reapproximated with #0 Vicryl in an interrupted fashion.  The subcuticular tissues were reapproximated with #0 Vicryl in an interrupted fashion.  The skin was closed with 3-0 nylon in a running locked fashion.  A Betadine Telfa dressing was applied and made occlusive with Op-Site.  The patient returned to the recovery room in good condition. DD:  11/01/00 TD:  11/01/00 Job: [id number]

## 2010-11-06 NOTE — Procedures (Signed)
[facility]  Patient:    [name], [name]                       MRN: [id number] Proc. Date: 06/16/00 Adm. Date:  [id number] Attending:  Thyra Breed CC:         Harvie Junior, M.D.   Procedure Report  PROCEDURE:  Lumbar epidural steroid injection.  DIAGNOSIS:  Lumbar radiculopathy with underlying herniated disk at 4-5 and L5-S1 with underlying lumbar spondylosis.  ANESTHESIOLOGIST:  Thyra Breed, M.D.  HISTORY:  The patient continues to have numbness in her lower extremities but has much less pain overall.  She rates her pain at 5/10.  She has tolerated the injections well without untoward effects.  PHYSICAL EXAMINATION:  VITAL SIGNS:  Blood pressure 137/81, heart rate 99, respiratory rate 20, O2 saturation 97%, plain level 5/10.  BACK:  Shows good healing from previous injection site.  PROCEDURE:  After informed consent was obtained, the patient was placed in the sitting position and monitored.  Her back was prepped with Betadine x 3.  A skin wheal was raised at the L4-5 interspace with 1% lidocaine.  A 20-gauge Tuohy needle was introduced in the lumbar epidural space to loss of resistance to preservative free normal saline.  There was no CSF nor blood.  Then 80 mg of Medrol and 8 ml preservative free normal saline was gently injected.  The needle was flushed with preservative free normal saline and removed intact.  POSTPROCEDURE CONDITION:  Stable.  DISCHARGE INSTRUCTIONS: 1. Resume previous diet. 2. Limitation of activities per instruction sheet. 3. Continue on current medications. 4. Follow up with Dr. Luiz Blare as previously arranged. DD:  06/16/00 TD:  06/16/00 Job: [id number]

## 2010-11-06 NOTE — H&P (Signed)
[place]. [facility]  Patient:    [name], [name]                       MRN: [id number] Adm. Date:  [id number] Attending:  Emeterio Reeve                         History and Physical  ADMISSION DIAGNOSIS: Questionable cerebrospinal fluid leak.  HISTORY OF PRESENT ILLNESS: This patient is a 70 year old right-handed black female who had an operation six days ago on her lumbar spine.  There was no leak found but because of questionable CSF in the field she was kept down for three days and did well.  She had some slight headache when she was lying down.  She was discharged home three days ago and had been doing well at home. She developed a headache and says it is worse when she is lying down, not when she is sitting up.  PAST MEDICAL HISTORY: SVT, for which she is on Lanoxin.  CURRENT MEDICATIONS:  1. Lanoxin.  2. Estradiol 1 mg q.d.  3. Atenolol 50 mg q.d.  SOCIAL HISTORY: She does not smoke or drink.  FAMILY HISTORY: Not given.  REVIEW OF SYSTEMS: Remarkable for headache.  No meningismus.  PHYSICAL EXAMINATION:  HEENT: Within normal limits.  NECK: Good range of motion, with possible stiffness with turning.  CHEST: Clear.  CARDIAC: Regular rate and rhythm.  ABDOMEN: Nontender.  No hepatosplenomegaly.  EXTREMITIES: No clubbing or cyanosis.  Peripheral pulses good.  GU: Examination deferred.  NEUROLOGIC: She is awake and alert and oriented.  Cranial nerves intact. Motor examination shows 5/5 strength throughout the upper and lower extremities.  No current sensory deficit.  Reflexes are 1 at the knees, absent at the ankles bilaterally.  CLINICAL IMPRESSION: Possible cerebrospinal fluid leak.  PLAN: I am going to study her with a CT scan and hydrate her at this time. DD:  11/07/00 TD:  11/08/00 Job: [id number]

## 2010-11-06 NOTE — Op Note (Signed)
[name], [name]                ACCOUNT NO.:  [id number]   MEDICAL RECORD NO.:  [id number]          PATIENT TYPE:  AMB   LOCATION:  DSC                          FACILITY:  [facility]   PHYSICIAN:  Feliberto Gottron. Turner Daniels, M.D.   DATE OF BIRTH:  [birth year]   DATE OF PROCEDURE:  07/07/2005  DATE OF DISCHARGE:                                 OPERATIVE REPORT   PREOPERATIVE DIAGNOSIS:  Right shoulder impingement with massive rotator  cuff tear.   POSTOPERATIVE DIAGNOSIS:  Right shoulder impingement with massive rotator  cuff tear.   PROCEDURE:  Right shoulder arthroscopic debridement of a massive rotator  cuff tear, greater tuberoplasty and resection of subacromial spur.   SURGEON:  Feliberto Gottron. Turner Daniels, MD   FIRST ASSISTANT:  Skip Mayer, PA-C.   ANESTHETIC:  Interscalene block with general endotracheal.   ESTIMATED BLOOD LOSS:  Minimal.   FLUID REPLACEMENT:  Liter of crystalloid.   DRAINS PLACED:  None.   TOURNIQUET TIME:  None.   INDICATIONS FOR PROCEDURE:  A 70 year old woman who injured her right  shoulder some months ago at work when she was doing lifting.  She was  evaluated and treated by one of my partners Dr. Althea Cook and found to have a  massive rotator cuff tear on MRI scan and she was referred for orthopedic  evaluation and treatment. Prior to this she had also had a cortisone  injection which gave her some temporary relief for a period of time.  In any  event, because of persistent pain, catching and popping, she is taken for  arthroscopic evaluation and treatment of her right shoulder and again the  MRI scan does show a large retracted rotator cuff tear that looks to be in  nonrepairable by MRI scan and she has a fairly significant subacromial spur  as well. Risks and benefits of surgery were discussed preoperatively and she  is prepared for surgical intervention.   DESCRIPTION OF PROCEDURE:  The patient was identified by armband, taken the  operating room  and [facility] after the successful induction  of right interscalene block anesthesia in the block room.  She was placed  supine on the operating table. Appropriate anesthetic monitors were attached  and general endotracheal anesthesia induced. She was then placed in the  beach chair position and the right upper extremity prepped, draped in usual  sterile fashion from the wrist to the hemithorax. Using a #11 blade,  standard portals were then made 1.5 cm anterior to the [facility] joint, lateral to  the junction of middle and posterior thirds acromion and posterior the  posterolateral corner of the acromion process. The inflow was placed  anteriorly, the arthroscope laterally, and a 4.2 great white sucker shaver  posteriorly allowing removal of the subacromial bursa and evaluation of the  massive rotator cuff tear which was retracted to the glenoid rim and not  repairable.  All loose flaps of tissue removed. We then identified the  subacromial spur and using a 4.5 hooded vortex bur, converted this to a type  1 acromion. The greater tuberosity also  had some soft tissue still attached  to it where the rotator cuff had torn. This was removed and then the greater  tuberoplasty was performed with a 4.5 hooded vortex bur, creating a smooth  greater tuberosity.  The biceps tendon anchor were noted to be in good  condition as was the labrum. The articular cartilage had minimal grade 2  chondromalacia and overall was in good condition. At this point, the  shoulder was irrigated out with normal saline solution. The arthroscopic  instruments were removed and a dressing of Xeroform, 4x4 dressing sponges,  paper tape and a sling applied. The patient was awakened and taken to the  recovery room without difficulty.      Feliberto Gottron. Turner Daniels, M.D.  Electronically Signed     FJR/MEDQ  D:  07/07/2005  T:  07/07/2005  Job:  [id number]

## 2010-11-06 NOTE — Discharge Summary (Signed)
[place]. [facility]  Patient:    [name], [name]                       MRN: [id number] Adm. Date:  [id number] Disc. Date: [id number] Attending:  Emeterio Reeve                           Discharge Summary  ADMISSION DIAGNOSIS:  Questionable cerebral spinal fluid leak.  DISCHARGE DIAGNOSIS:  No cerebral spinal fluid leak.  PROCEDURES:  None.  COMPLICATIONS:  None.  CONDITION ON DISCHARGE:  Doing well.  HISTORY OF PRESENT ILLNESS:  This is a 70 year old, right-handed, African-American lady whose History and Physical is recounted in the chart. She was admitted on May 20, because of questionable CSF leak.  She had some headache that was nonpostural, but she was admitted for further evaluation and management.  PAST MEDICAL HISTORY:  SVT for which she is on Lanoxin.  MEDICATIONS: 1. Lanoxin. 2. Estradiol. 3. Atenolol. 4. Vicodin.  SOCIAL HISTORY:  No smoking or drinking.  FAMILY HISTORY:  Unremarkable.  REVIEW OF SYSTEMS:  Remarkable for headache, no meningismus.  PHYSICAL EXAMINATION:  Her exam was intact.  Neurologic exam was intact and her headache was not postural.  HOSPITAL COURSE:  She was admitted and placed on bed rest and hydrated. Headache continued even when she was lying down.  She underwent a CT scan of her lumbar spine that did not demonstrate any CSF leak.  CT of the head was negative.  While in the bed, her headache has slowly resolved while using Midrin which is a medicine for migraine headaches.  Her exam remained unchanged.  There is no meningismus.  Her white count was normal.  After being in the bed for a day or so, she got up and began moving about.  IV was stopped.  She is eating and voiding normally.  She has minimal complaints of headache.  There is no postural component to her headache and it is responsive to Midrin.  She is being discharged home with Midrin for headache with the knowledge that she does not have a CSF  leak.  FOLLOWUP:  Follow up with Dr. Channing Mutters in the [facility] office in about two weeks. DD:  11/11/00 TD:  11/12/00 Job: [id number]

## 2010-11-06 NOTE — Discharge Summary (Signed)
. [facility]  Patient:    [name], [name]                       MRN: [id number] Adm. Date:  [id number] Disc. Date: 11/05/00 Attending:  Emeterio Reeve                           Discharge Summary  ADMITTING DIAGNOSIS:  Herniated disc at L4-L5, L5-S1 with spondylosis at L4-L5.  PROCEDURE:  L4-L5 and L5-S1 laminotomy and foraminotomy with L5-S1 diskectomy.  COMPLICATIONS:  Questionable CSF leak.  HISTORY OF PRESENT ILLNESS:  This is a 70 year old, right-handed, African-American lady whose History and Physical is recounted in the chart. She has had back and right lower extremity pain.  Epidural steroids did not do very much.  She had an MRI that showed spondylosis in L4-L5 and disk at L5-S1 and was admitted for decompression.  PAST MEDICAL HISTORY: 1. SVT.  She is on Lanoxin 0.25 mg a day. 2. Hypertension.  She is on 50 mg of Atenolol.  PAST SURGICAL HISTORY: 1. Hysterectomy. 2. Cholecystectomy. 3. Tubal ligation. 4. Bone spurs.  ALLERGY:  PENICILLIN.  PHYSICAL EXAMINATION:  GENERAL:  Unremarkable except for obesity and heart murmur.  NEUROLOGIC:  Intact with mild dorsiflexion and weakness on the right side and a right L5-S1 sensory deficit.  HOSPITAL COURSE:  She was admitted after receiving normal laboratory values and underwent a laminotomy and foraminotomy at L4-L5 and L5-S1 with diskectomy at L5-S1.  There was a question of a CSF leak with no leak actually discovered, but because of some fluid that was seen in the incision, the thrombin soaked Gelfoam was used to cover the dural over the laminectomy defect.  The patient was kept down in bed for three days postoperatively.  She has never had any evidence of leak and has not had any postural headache.  She had a little bit of sinus headache which was treated with Claritin.  She was kept on DVT prophylaxis.  Her strength is full and her incisions dry at this time.  She is up  ambulating, eating and voiding normally.  She is now being discharged home to her family on Vicodin for pain.  FOLLOWUP:  Follow up in the [facility] office in about a week for suture removal. DD:  11/05/00 TD:  11/07/00 Job: [id number]

## 2010-11-06 NOTE — Procedures (Signed)
[facility]  Patient:    [name], [name]                       MRN: [id number] Proc. Date: 05/30/00 Adm. Date:  [id number] Attending:  Thyra Breed CC:         Harvie Junior, M.D.  Farley Ly, M.D.   Procedure Report  PROCEDURE:  Lumbar epidural steroid injections.  DIAGNOSES:  Lumbar radiculopathy with underlying herniated nucleus pulposus at L4-5 and L5-S1, and lumbar spondylosis.  INTERVAL HISTORY:  This patient has noted absolutely no improvement after the first injection.  She did not notice any untoward effects from it but really was not benefited.  PHYSICAL EXAMINATION:  VITAL SIGNS:  Blood pressure 144/70, heart rate 72, respiratory rate 17, and O2 sat 97%.  GENERAL:  Pain level 5/10.  BACK:  Shows good healing from her previous injection site.  DESCRIPTION OF PROCEDURE:  After an informed consent was obtained, the patient was placed in a sitting position and was monitored.  Her back was prepped with Betadine x3. A skin wheal was raised at the L4-5 interspace with 1% lidocaine. A 20-gauge Tuohy needle was introduced to the lumbar epidural space with loss of resistance to preservative-free normal saline.  There was no CSF and no blood. 80 mg of Medrol and 10 ml of preservative-free normal saline was gently injected.  The needle was flushed with preservative-free normal saline and removed intact.  POST-PROCEDURE CONDITION:  Stable.  DISPOSITION: 1. Resume previous status. 2. Limitation of activities per instruction sheet as outlined by my assistant    today. 3. Continue current medications. 4. Follow up with me in 1-2 weeks for repeat injection, if she responds to    this injection.  She was advised that if she does not respond to this, then    there is really no need for a third injection. DD:  05/30/00 TD:  05/30/00 Job: [id number]

## 2010-11-11 ENCOUNTER — Ambulatory Visit: Payer: 59 | Admitting: Internal Medicine

## 2010-12-07 ENCOUNTER — Other Ambulatory Visit: Payer: Self-pay | Admitting: *Deleted

## 2010-12-07 DIAGNOSIS — E876 Hypokalemia: Secondary | ICD-10-CM

## 2010-12-07 MED ORDER — DIGOXIN 250 MCG PO TABS: 125.0000 ug | ORAL_TABLET | Freq: Every day | ORAL | Status: DC

## 2010-12-07 MED ORDER — POTASSIUM CHLORIDE 8 MEQ PO TBCR: 8.0000 meq | EXTENDED_RELEASE_TABLET | Freq: Every day | ORAL | Status: DC

## 2010-12-30 ENCOUNTER — Encounter: Payer: PRIVATE HEALTH INSURANCE | Admitting: Internal Medicine

## 2011-01-04 ENCOUNTER — Encounter: Payer: Self-pay | Admitting: Internal Medicine

## 2011-01-07 ENCOUNTER — Other Ambulatory Visit: Payer: Self-pay | Admitting: Internal Medicine

## 2011-02-17 ENCOUNTER — Other Ambulatory Visit: Payer: Self-pay | Admitting: Internal Medicine

## 2011-02-17 ENCOUNTER — Encounter: Payer: Self-pay | Admitting: Internal Medicine

## 2011-02-17 ENCOUNTER — Ambulatory Visit (INDEPENDENT_AMBULATORY_CARE_PROVIDER_SITE_OTHER): Payer: PRIVATE HEALTH INSURANCE | Admitting: Internal Medicine

## 2011-02-17 VITALS — BP 128/78 | HR 79 | Temp 97.7°F | Wt 232.0 lb

## 2011-02-17 DIAGNOSIS — E785 Hyperlipidemia, unspecified: Secondary | ICD-10-CM

## 2011-02-17 DIAGNOSIS — I10 Essential (primary) hypertension: Secondary | ICD-10-CM

## 2011-02-17 DIAGNOSIS — Z1231 Encounter for screening mammogram for malignant neoplasm of breast: Secondary | ICD-10-CM

## 2011-02-17 DIAGNOSIS — R609 Edema, unspecified: Secondary | ICD-10-CM

## 2011-02-17 DIAGNOSIS — Z79899 Other long term (current) drug therapy: Secondary | ICD-10-CM

## 2011-02-17 MED ORDER — POTASSIUM CHLORIDE CRYS ER 20 MEQ PO TBCR: 20.0000 meq | EXTENDED_RELEASE_TABLET | Freq: Every day | ORAL | Status: DC

## 2011-02-17 MED ORDER — FUROSEMIDE 20 MG PO TABS: 20.0000 mg | ORAL_TABLET | Freq: Every day | ORAL | Status: DC

## 2011-02-17 NOTE — Progress Notes (Signed)
  Subjective:    Patient ID: [name], female    DOB: 1941-03-12, 70 y.o.   MRN: [id number]  HPI Patient returns for followup of her hypertension, bilateral leg edema, and other chronic medical problems.  Today she reports increased bilateral leg and pedal edema, and also reports increased pain in her right foot.  She saw her orthopedist surgeon Dr. Orvis Brill for the foot pain and says that he felt that this was due to bone spurs.  He started Relafen, although she reports that this has not helped the pain a great deal.  She reports that she has been compliant with her medications.   On review of systems, she does report 2 pillow orthopnea but no PND.   Review of Systems  Constitutional: Negative for fever, chills and diaphoresis.  Respiratory: Negative for cough, shortness of breath and wheezing.   Cardiovascular: Positive for leg swelling. Negative for chest pain.  Gastrointestinal: Negative for nausea, vomiting, abdominal pain and blood in stool.  Genitourinary: Negative for dysuria and frequency.       Objective:   Physical Exam  Cardiovascular: Regular rhythm and normal heart sounds.  Exam reveals no gallop and no friction rub.   No murmur heard.      There is 1+ bilateral pitting edema of the legs and feet.  Pulmonary/Chest: Effort normal and breath sounds normal. No respiratory distress. She has no wheezes. She has no rales.  Abdominal: Soft. Bowel sounds are normal. She exhibits no distension. There is no tenderness.          Assessment & Plan:

## 2011-02-17 NOTE — Assessment & Plan Note (Signed)
Lab Results  Component Value Date   NA 141 10/07/2010   K 3.5 10/07/2010   CL 104 10/07/2010   CO2 28 10/07/2010   BUN 14 10/07/2010   CREATININE 0.56 10/07/2010   CREATININE 0.53 03/31/2010    BP Readings from Last 3 Encounters:  02/17/11 128/78  10/07/10 144/84  05/06/10 120/73    Assessment: Hypertension control:  controlled  Progress toward goals:  at goal Barriers to meeting goals:  no barriers identified  Plan: Hypertension treatment:  I am changing patient's hydrochlorothiazide to Lasix as noted above as treatment of her edema; otherwise continue current medications.

## 2011-02-17 NOTE — Assessment & Plan Note (Signed)
Patient is doing well on current medication regimen.  The plan is to have her return for fasting labs including a fasting lipid panel within the next week.

## 2011-02-17 NOTE — Assessment & Plan Note (Signed)
Patient has had some gradual worsening of her bilateral leg and pedal edema; she also reports 2 pillow orthopnea.  Her last echocardiogram in 2008 showed normal left ventricular function.  The plan is to obtain labs including a metabolic panel and CBC, and also to obtain a 2-D echocardiogram to assess her left ventricular function and valves.  I will stop hydrochlorothiazide and start Lasix 20 mg daily, and I also increased her potassium chloride supplement from 8 mEq daily to a dose of 20 mEq daily given the change in her diuretic.

## 2011-02-17 NOTE — Patient Instructions (Signed)
Stop hydrochlorothiazide (HCTZ). Start furosemide (Lasix) 20 mg one tablet daily. Stop potassium chloride 8 mEq dose. Start potassium chloride 20 mEq one tablet daily. Please call if you have any problems with the medication changes. Please keep appointment for routine screening mammogram. Please keep appointment for echocardiogram.

## 2011-02-26 ENCOUNTER — Ambulatory Visit (HOSPITAL_COMMUNITY): Payer: PRIVATE HEALTH INSURANCE

## 2011-03-02 ENCOUNTER — Ambulatory Visit (HOSPITAL_COMMUNITY): Payer: PRIVATE HEALTH INSURANCE

## 2011-03-08 ENCOUNTER — Other Ambulatory Visit: Payer: Self-pay | Admitting: *Deleted

## 2011-03-08 MED ORDER — CLONIDINE HCL 0.1 MG PO TABS: 0.1000 mg | ORAL_TABLET | Freq: Every day | ORAL | Status: DC

## 2011-03-10 ENCOUNTER — Ambulatory Visit (HOSPITAL_COMMUNITY): Payer: PRIVATE HEALTH INSURANCE

## 2011-03-16 ENCOUNTER — Ambulatory Visit (HOSPITAL_COMMUNITY): Payer: PRIVATE HEALTH INSURANCE

## 2011-03-16 ENCOUNTER — Other Ambulatory Visit: Payer: PRIVATE HEALTH INSURANCE

## 2011-03-17 ENCOUNTER — Ambulatory Visit (HOSPITAL_COMMUNITY): Payer: PRIVATE HEALTH INSURANCE

## 2011-03-17 ENCOUNTER — Ambulatory Visit (HOSPITAL_COMMUNITY)
Admission: RE | Admit: 2011-03-17 | Discharge: 2011-03-17 | Disposition: A | Discharge status: Home / Self Care | Payer: PRIVATE HEALTH INSURANCE | Source: Ambulatory Visit | Attending: Internal Medicine | Admitting: Internal Medicine

## 2011-03-17 DIAGNOSIS — E7800 Pure hypercholesterolemia, unspecified: Secondary | ICD-10-CM | POA: Insufficient documentation

## 2011-03-17 DIAGNOSIS — R609 Edema, unspecified: Secondary | ICD-10-CM

## 2011-03-17 DIAGNOSIS — I10 Essential (primary) hypertension: Secondary | ICD-10-CM | POA: Insufficient documentation

## 2011-03-17 DIAGNOSIS — I079 Rheumatic tricuspid valve disease, unspecified: Secondary | ICD-10-CM | POA: Insufficient documentation

## 2011-03-25 ENCOUNTER — Ambulatory Visit (HOSPITAL_COMMUNITY): Payer: PRIVATE HEALTH INSURANCE

## 2011-04-01 ENCOUNTER — Ambulatory Visit (HOSPITAL_COMMUNITY)
Admission: RE | Admit: 2011-04-01 | Discharge: 2011-04-01 | Disposition: A | Discharge status: Home / Self Care | Payer: PRIVATE HEALTH INSURANCE | Source: Ambulatory Visit | Attending: Internal Medicine | Admitting: Internal Medicine

## 2011-04-01 DIAGNOSIS — Z1231 Encounter for screening mammogram for malignant neoplasm of breast: Secondary | ICD-10-CM | POA: Insufficient documentation

## 2011-04-01 LAB — I-STAT 8, (EC8 V) (CONVERTED LAB)
Acid-Base Excess: 4 — ABNORMAL HIGH
Chloride: 102
HCT: 44
Hemoglobin: 15
Potassium: 3.3 — ABNORMAL LOW
Sodium: 139
pH, Ven: 7.427 — ABNORMAL HIGH

## 2011-04-01 LAB — DIFFERENTIAL
Lymphocytes Relative: 4 — ABNORMAL LOW
Lymphs Abs: 0.5 — ABNORMAL LOW
Monocytes Absolute: 0.1 — ABNORMAL LOW
Monocytes Relative: 1 — ABNORMAL LOW
Neutro Abs: 9.9 — ABNORMAL HIGH
Neutrophils Relative %: 94 — ABNORMAL HIGH

## 2011-04-01 LAB — STOOL CULTURE

## 2011-04-01 LAB — CLOSTRIDIUM DIFFICILE EIA

## 2011-04-01 LAB — POCT I-STAT CREATININE: Operator id: 239701

## 2011-04-01 LAB — CBC
Hemoglobin: 12.9
MCHC: 32.4
RBC: 4.76
WBC: 10.6 — ABNORMAL HIGH

## 2011-04-01 LAB — POCT URINALYSIS DIP (DEVICE)
Glucose, UA: NEGATIVE
Ketones, ur: NEGATIVE
pH: 5.5

## 2011-04-01 LAB — FECAL LACTOFERRIN, QUANT

## 2011-04-12 ENCOUNTER — Other Ambulatory Visit: Payer: PRIVATE HEALTH INSURANCE

## 2011-04-19 ENCOUNTER — Other Ambulatory Visit: Payer: PRIVATE HEALTH INSURANCE

## 2011-05-05 ENCOUNTER — Ambulatory Visit (INDEPENDENT_AMBULATORY_CARE_PROVIDER_SITE_OTHER): Payer: PRIVATE HEALTH INSURANCE | Admitting: Internal Medicine

## 2011-05-05 ENCOUNTER — Encounter: Payer: Self-pay | Admitting: Internal Medicine

## 2011-05-05 VITALS — BP 148/84 | HR 78 | Temp 97.3°F | Ht 68.0 in | Wt 241.1 lb

## 2011-05-05 DIAGNOSIS — I10 Essential (primary) hypertension: Secondary | ICD-10-CM

## 2011-05-05 DIAGNOSIS — R609 Edema, unspecified: Secondary | ICD-10-CM

## 2011-05-05 DIAGNOSIS — M79604 Pain in right leg: Secondary | ICD-10-CM

## 2011-05-05 DIAGNOSIS — E785 Hyperlipidemia, unspecified: Secondary | ICD-10-CM

## 2011-05-05 DIAGNOSIS — Z79899 Other long term (current) drug therapy: Secondary | ICD-10-CM

## 2011-05-05 DIAGNOSIS — M79609 Pain in unspecified limb: Secondary | ICD-10-CM

## 2011-05-05 DIAGNOSIS — Z23 Encounter for immunization: Secondary | ICD-10-CM

## 2011-05-05 LAB — LIPID PANEL
Cholesterol: 222 mg/dL — ABNORMAL HIGH (ref 0–200)
HDL: 59 mg/dL (ref >39)
Total CHOL/HDL Ratio: 3.8 Ratio

## 2011-05-05 LAB — COMPLETE METABOLIC PANEL WITH GFR
Alkaline Phosphatase: 152 U/L — ABNORMAL HIGH (ref 39–117)
BUN: 8 mg/dL (ref 6–23)
CO2: 31 mEq/L (ref 19–32)
Creat: 0.54 mg/dL (ref 0.50–1.10)
GFR, Est African American: >89 mL/min/{1.73_m2}
GFR, Est Non African American: >89 mL/min/{1.73_m2}
Glucose, Bld: 93 mg/dL (ref 70–99)
Total Bilirubin: 0.5 mg/dL (ref 0.3–1.2)

## 2011-05-05 LAB — CBC WITH DIFFERENTIAL/PLATELET
Basophils Absolute: 0 10*3/uL (ref 0.0–0.1)
Basophils Relative: 0 % (ref 0–1)
HCT: 38.2 % (ref 36.0–46.0)
MCH: 26.7 pg (ref 26.0–34.0)
MCV: 84.3 fL (ref 78.0–100.0)
Monocytes Relative: 6 % (ref 3–12)
Neutro Abs: 2.6 10*3/uL (ref 1.7–7.7)
Neutrophils Relative %: 50 % (ref 43–77)
Platelets: 186 10*3/uL (ref 150–400)
RBC: 4.53 MIL/uL (ref 3.87–5.11)
RDW: 12.9 % (ref 11.5–15.5)
WBC: 5.2 10*3/uL (ref 4.0–10.5)

## 2011-05-05 MED ORDER — FUROSEMIDE 20 MG PO TABS: 40.0000 mg | ORAL_TABLET | Freq: Every day | ORAL | Status: DC

## 2011-05-05 NOTE — Patient Instructions (Signed)
Increase furosemide 20 mg to a dose of 2 tablets daily. Please stop by the clinic lab today for blood work.

## 2011-05-05 NOTE — Assessment & Plan Note (Signed)
Lipids:    Component Value Date/Time   CHOL 210* 01/30/2010 1845   TRIG 54 01/30/2010 1845   HDL 55 01/30/2010 1845   LDLCALC 144* 01/30/2010 1845   VLDL 11 01/30/2010 1845   CHOLHDL 3.8 Ratio 01/30/2010 1845   I ordered a fasting lipid panel at the time of patient's last visit, but she did not return for fasting blood work.  She is currently doing well on pravastatin with no apparent side effects.  The plan is to draw labs today including a lipid panel, and continue pravastatin pending results.

## 2011-05-05 NOTE — Assessment & Plan Note (Signed)
Lab Results  Component Value Date   NA 141 10/07/2010   K 3.5 10/07/2010   CL 104 10/07/2010   CO2 28 10/07/2010   BUN 14 10/07/2010   CREATININE 0.56 10/07/2010   CREATININE 0.53 03/31/2010    BP Readings from Last 3 Encounters:  05/05/11 148/84  02/17/11 128/78  10/07/10 144/84    Assessment: Hypertension control:  mildly elevated  Progress toward goals:  deteriorated Barriers to meeting goals:  no barriers identified  Plan: Hypertension treatment:  since patient has some persisting bilateral leg edema, the plan is to increase furosemide from 20 mg daily to a dose of 40 mg daily.  Will check a metabolic panel today.

## 2011-05-05 NOTE — Assessment & Plan Note (Signed)
Patient's pain has largely resolved.  She will follow up with her orthopedist Dr. Turner Daniels.

## 2011-05-05 NOTE — Assessment & Plan Note (Signed)
Patient's bilateral leg edema has improved on furosemide, but she still has some persisting leg edema.  The plan is to increase furosemide from 20 mg daily to a dose of 40 mg daily.  We'll check a metabolic panel today.  I advised her to obtain a scale and weigh daily and record her weight; she currently does not have a bathroom scale.

## 2011-05-05 NOTE — Progress Notes (Signed)
  Subjective:    Patient ID: [name], female    DOB: Jan 03, 1941, 70 y.o.   MRN: [id number]  HPI Patient presents for followup of her bilateral leg edema, hypertension, leg/foot pain, and other chronic medical problems.  Today she reports that she has been doing well; her leg pain and foot pain have largely resolved.  Her leg edema is somewhat better on furosemide, but she still has bilateral leg edema which increases during the day when she is on her feet.   Review of Systems  Respiratory: Negative for shortness of breath.   Cardiovascular: Positive for leg swelling (Improved, now has occasional right ankle swelling). Negative for chest pain.  Gastrointestinal: Negative for nausea, vomiting and abdominal pain.  Genitourinary: Negative for dysuria and frequency.       Objective:   Physical Exam  Constitutional: No distress.  Cardiovascular: Normal rate, regular rhythm and normal heart sounds.  Exam reveals no gallop and no friction rub.   No murmur heard.      1+ bilateral leg edema  Pulmonary/Chest: Effort normal and breath sounds normal. No respiratory distress. She has no wheezes. She has no rales.  Abdominal: Soft. Bowel sounds are normal. She exhibits no distension. There is no tenderness. There is no guarding.       Assessment & Plan:

## 2011-05-06 LAB — DIGOXIN LEVEL: Digoxin Level: 0.3 ng/mL — ABNORMAL LOW (ref 0.8–2.0)

## 2011-05-08 ENCOUNTER — Telehealth: Payer: Self-pay | Admitting: Internal Medicine

## 2011-05-08 DIAGNOSIS — E785 Hyperlipidemia, unspecified: Secondary | ICD-10-CM

## 2011-05-08 MED ORDER — PRAVASTATIN SODIUM 10 MG PO TABS: 20.0000 mg | ORAL_TABLET | Freq: Every day | ORAL | Status: DC

## 2011-05-08 NOTE — Telephone Encounter (Signed)
Lipids:    Component Value Date/Time   CHOL 222* 05/05/2011 1646   TRIG 77 05/05/2011 1646   HDL 59 05/05/2011 1646   LDLCALC 148* 05/05/2011 1646   VLDL 15 05/05/2011 1646   CHOLHDL 3.8 05/05/2011 1646    Patient's LDL on November 14 was 148, on a pravastatin dose of 10 mg daily.  The plan is to increase pravastatin to a dose of 20 mg daily.  I called patient today and advised her to increase her pravastatin 10 mg tablets to a dose of 2 tablets once a day.  Her next appointment with me is on January 9; I also advised her to come in on December 10 for labs only, at which time I will check a comprehensive metabolic panel given her recently increased Lasix dose and the increase in pravastatin dose.  I advised her to call if she has any problems prior to her appointment.

## 2011-05-08 NOTE — Assessment & Plan Note (Signed)
Lipids:    Component Value Date/Time   CHOL 222* 05/05/2011 1646   TRIG 77 05/05/2011 1646   HDL 59 05/05/2011 1646   LDLCALC 148* 05/05/2011 1646   VLDL 15 05/05/2011 1646   CHOLHDL 3.8 05/05/2011 1646    Patient's LDL on November 14 was 148, on a pravastatin dose of 10 mg daily.  The plan is to increase pravastatin to a dose of 20 mg daily.  I called patient today and advised her to increase her pravastatin 10 mg tablets to a dose of 2 tablets once a day.  Her next appointment with me is on January 9; I also advised her to come in on December 10 for labs only, at which time I will check a comprehensive metabolic panel given her recently increased Lasix dose and the increase in pravastatin dose.  I advised her to call if she has any problems prior to her appointment. 

## 2011-06-30 ENCOUNTER — Ambulatory Visit: Payer: PRIVATE HEALTH INSURANCE | Admitting: Internal Medicine

## 2011-07-05 ENCOUNTER — Other Ambulatory Visit: Payer: Self-pay | Admitting: *Deleted

## 2011-07-05 ENCOUNTER — Other Ambulatory Visit: Payer: Self-pay | Admitting: Internal Medicine

## 2011-07-05 MED ORDER — DIGOXIN 250 MCG PO TABS: 125.0000 ug | ORAL_TABLET | Freq: Every day | ORAL | Status: DC

## 2011-08-11 ENCOUNTER — Encounter: Payer: Self-pay | Admitting: Internal Medicine

## 2011-08-11 ENCOUNTER — Ambulatory Visit (INDEPENDENT_AMBULATORY_CARE_PROVIDER_SITE_OTHER): Payer: PRIVATE HEALTH INSURANCE | Admitting: Internal Medicine

## 2011-08-11 DIAGNOSIS — E785 Hyperlipidemia, unspecified: Secondary | ICD-10-CM

## 2011-08-11 DIAGNOSIS — R609 Edema, unspecified: Secondary | ICD-10-CM

## 2011-08-11 DIAGNOSIS — J069 Acute upper respiratory infection, unspecified: Secondary | ICD-10-CM

## 2011-08-11 DIAGNOSIS — G8929 Other chronic pain: Secondary | ICD-10-CM

## 2011-08-11 DIAGNOSIS — I10 Essential (primary) hypertension: Secondary | ICD-10-CM

## 2011-08-11 DIAGNOSIS — E559 Vitamin D deficiency, unspecified: Secondary | ICD-10-CM

## 2011-08-11 DIAGNOSIS — M25569 Pain in unspecified knee: Secondary | ICD-10-CM

## 2011-08-11 LAB — LIPID PANEL
Cholesterol: 221 mg/dL — ABNORMAL HIGH (ref 0–200)
HDL: 60 mg/dL (ref >39)
Total CHOL/HDL Ratio: 3.7 Ratio
Triglycerides: 55 mg/dL (ref <150)
VLDL: 11 mg/dL (ref 0–40)

## 2011-08-11 NOTE — Patient Instructions (Signed)
Continue current medications. Use over-the-counter Robitussin DM cough syrup or generic equivalent for cough; call if cough persists more than 1 week.

## 2011-08-11 NOTE — Assessment & Plan Note (Signed)
Assessment: Patient's leg edema has resolved on current dose of furosemide 40 mg daily.  Plan: Continue furosemide at current dose; check metabolic panel today.

## 2011-08-11 NOTE — Assessment & Plan Note (Signed)
Assessment: Patient's chronic knee pain is doing reasonably well on occasional use of her NSAID medication.  Plan: Patient will follow up with her orthopedic surgeon as planned.

## 2011-08-11 NOTE — Progress Notes (Signed)
  Subjective:    Patient ID: [name], female    DOB: 1940-12-26, 71 y.o.   MRN: [id number]  HPI Patient returns for followup of her hypertension, hyperlipidemia, DJD, leg edema, and other chronic medical problems.  She reports a recent cold with cough which began about 4 days ago and is getting better.  She has chronic right knee pain, and is followed by orthopedist Dr. Turner Daniels; however, she has been taking her Relafen only about once per week as needed for pain. She has no other acute complaints, and reports that she has been doing well.  She reports that she is compliant with her medications.   Review of Systems  Constitutional: Negative for fever, chills and diaphoresis.  HENT: Negative for ear pain, sore throat, rhinorrhea, sneezing and ear discharge.   Respiratory: Positive for cough (Recent cold, getting better.). Negative for shortness of breath.   Cardiovascular: Negative for chest pain and leg swelling.  Gastrointestinal: Negative for nausea, vomiting, abdominal pain and blood in stool.  Genitourinary: Negative for dysuria and frequency.  Musculoskeletal: Positive for arthralgias (Chronic right knee pain). Negative for myalgias.       Objective:   Physical Exam  Constitutional: No distress.  HENT:  Mouth/Throat: Oropharynx is clear and moist. No oropharyngeal exudate.  Cardiovascular: Normal rate, regular rhythm and normal heart sounds.  Exam reveals no gallop and no friction rub.   No murmur heard. Pulmonary/Chest: Effort normal and breath sounds normal. No respiratory distress. She has no wheezes. She has no rales.  Abdominal: Soft. Bowel sounds are normal. She exhibits no distension. There is no tenderness. There is no guarding.  Musculoskeletal: She exhibits no edema.        Assessment & Plan:

## 2011-08-11 NOTE — Assessment & Plan Note (Signed)
Lab Results  Component Value Date   NA 144 05/05/2011   K 4.0 05/05/2011   CL 107 05/05/2011   CO2 31 05/05/2011   BUN 8 05/05/2011   CREATININE 0.54 05/05/2011    BP Readings from Last 3 Encounters:  08/11/11 136/78  05/05/11 148/84  02/17/11 128/78    Assessment: Hypertension control:  controlled  Progress toward goals:  at goal Barriers to meeting goals:  no barriers identified  Plan: Hypertension treatment:  continue current medications

## 2011-08-11 NOTE — Assessment & Plan Note (Signed)
Assessment: Patient has symptoms of common cold, and her symptoms are improving.  Plan: Symptomatic treatment with Robitussin-DM cough syrup; I advised her to let me know if her symptoms worsen or do not improve.

## 2011-08-11 NOTE — Assessment & Plan Note (Signed)
Assessment: Patient is currently taking supplemental vitamin D capsule daily.  Plan: Check a vitamin D level today; she is also on sublingual B12 supplementation, and will check a B12 level as well.

## 2011-08-11 NOTE — Assessment & Plan Note (Signed)
Lipids:    Component Value Date/Time   CHOL 222* 05/05/2011 1646   TRIG 77 05/05/2011 1646   HDL 59 05/05/2011 1646   LDLCALC 148* 05/05/2011 1646   VLDL 15 05/05/2011 1646   CHOLHDL 3.8 05/05/2011 1646   Assessment: Patient is doing well on current dose of pravastatin, which was increased following her last lipid panel.  Plan: Check labs today including a comprehensive metabolic panel and lipid panel; continue current dose of pravastatin 20 mg daily pending that result.

## 2011-08-12 LAB — COMPLETE METABOLIC PANEL WITH GFR
ALT: 8 U/L (ref 0–35)
AST: 11 U/L (ref 0–37)
Alkaline Phosphatase: 130 U/L — ABNORMAL HIGH (ref 39–117)
CO2: 25 mEq/L (ref 19–32)
Sodium: 142 mEq/L (ref 135–145)
Total Bilirubin: 0.9 mg/dL (ref 0.3–1.2)
Total Protein: 6.4 g/dL (ref 6.0–8.3)

## 2011-08-19 ENCOUNTER — Telehealth: Payer: Self-pay | Admitting: Internal Medicine

## 2011-08-19 DIAGNOSIS — E785 Hyperlipidemia, unspecified: Secondary | ICD-10-CM

## 2011-08-19 DIAGNOSIS — E559 Vitamin D deficiency, unspecified: Secondary | ICD-10-CM

## 2011-08-19 MED ORDER — PRAVASTATIN SODIUM 40 MG PO TABS: 40.0000 mg | ORAL_TABLET | Freq: Every day | ORAL | Status: DC

## 2011-08-19 NOTE — Assessment & Plan Note (Signed)
Lipids:    Component Value Date/Time   CHOL 221* 08/11/2011 0939   TRIG 55 08/11/2011 0939   HDL 60 08/11/2011 0939   LDLCALC 150* 08/11/2011 0939   VLDL 11 08/11/2011 0939   CHOLHDL 3.7 08/11/2011 0939    Patient's LDL is elevated at 150, despite her being on pravastatin 20 mg daily.  I called and discussed this with her by phone today.  She confirmed that she is taking pravastatin regularly, only missing an occasional dose.  The plan is to increase pravastatin to a dose of 40 mg daily, and I advised her of this.  I also reviewed potential side effects of statins with her, and I advised her to let me know immediately she has any problems with the higher dose.

## 2011-08-19 NOTE — Telephone Encounter (Addendum)
Patient's LDL is elevated at 150, despite her being on pravastatin 20 mg daily.  I called and discussed this with her by phone today.  She confirmed that she is taking pravastatin regularly, only missing an occasional dose.  The plan is to increase pravastatin to a dose of 40 mg daily, and I advised her of this.  Her vitamin D level is still low, and I reinforced the importance of taking her vitamin D supplement regularly; will recheck a level upon return.    I also reviewed potential side effects of statins with her, and I advised her to let me know immediately she has any problems with the higher dose.

## 2011-08-19 NOTE — Assessment & Plan Note (Addendum)
Her vitamin D level is still low, at 23; I spoke with patient by phone today and I reinforced the importance of taking her vitamin D supplement regularly; will recheck a level upon return.

## 2011-08-23 NOTE — Telephone Encounter (Signed)
Addended by: Bufford Spikes on: 08/23/2011 04:31 PM   Modules accepted: Orders

## 2011-08-29 ENCOUNTER — Encounter: Payer: Self-pay | Admitting: Internal Medicine

## 2011-08-29 NOTE — Progress Notes (Signed)
Patient called saying that she took two clonidine pills by mistake and was very apprehensive. Denied any headaches, dizziness or lightheadedness. - She was reassured and advised to call if she starts  having any of the above symptoms.

## 2011-09-18 ENCOUNTER — Other Ambulatory Visit: Payer: Self-pay | Admitting: Internal Medicine

## 2011-11-03 ENCOUNTER — Other Ambulatory Visit: Payer: Self-pay | Admitting: Internal Medicine

## 2011-11-08 ENCOUNTER — Other Ambulatory Visit: Payer: Self-pay | Admitting: Internal Medicine

## 2011-12-01 ENCOUNTER — Encounter: Payer: Self-pay | Admitting: Internal Medicine

## 2011-12-01 ENCOUNTER — Ambulatory Visit (INDEPENDENT_AMBULATORY_CARE_PROVIDER_SITE_OTHER): Payer: PRIVATE HEALTH INSURANCE | Admitting: Internal Medicine

## 2011-12-01 VITALS — BP 131/76 | HR 82 | Temp 98.7°F | Ht 68.0 in | Wt 232.1 lb

## 2011-12-01 DIAGNOSIS — E785 Hyperlipidemia, unspecified: Secondary | ICD-10-CM

## 2011-12-01 DIAGNOSIS — I10 Essential (primary) hypertension: Secondary | ICD-10-CM

## 2011-12-01 DIAGNOSIS — R609 Edema, unspecified: Secondary | ICD-10-CM

## 2011-12-01 NOTE — Assessment & Plan Note (Signed)
Assessment: Patient has symmetrical bilateral ankle edema which has increased since her last visit.  An echocardiogram done in 2012 showed normal left ventricular function.  Her edema may be due to some degree of diastolic heart failure or venous insufficiency; her NSAID (Relafen) may be an aggravating factor.  Plan: I advised patient to stop Relafen and try bilateral compression stockings along with elevation of her feet when able during the day.  She will let me know if the edema persists, and if it does then will consider increasing her furosemide dose to 60 mg daily.  She will return for a fasting comprehensive metabolic panel.

## 2011-12-01 NOTE — Patient Instructions (Signed)
1. Stop nabumetone (Relafen). 2.  Please try compression stockings for leg swelling. 3. Please return for fasting labs within 1 week.

## 2011-12-01 NOTE — Progress Notes (Signed)
  Subjective:    Patient ID: [name], female    DOB: 1941/06/07, 71 y.o.   MRN: [id number]  HPI Patient returns for followup of her bilateral leg edema, hypertension, hyperlipidemia, and other chronic medical problems.  Today she reports some recurrent pain on the dorsal aspect of her right foot, and she says that her orthopedic physician felt that this was due to bone spurs.  She has an appointment this afternoon to see her podiatrist.  She also reports an increase in her bilateral ankle edema since her last visit here.  She reports that she has been compliant with her medications including her furosemide.  She has no other complaints today.   Review of Systems  Constitutional: Negative for fever, chills and diaphoresis.  Respiratory: Negative for chest tightness and shortness of breath.   Cardiovascular: Positive for leg swelling (Bilateral ankle edema, right greater than left.). Negative for chest pain and palpitations.  Gastrointestinal: Negative for nausea, vomiting, abdominal pain and blood in stool.  Genitourinary: Negative for dysuria and difficulty urinating.  Musculoskeletal: Negative for myalgias.       Objective:   Physical Exam  Constitutional: No distress.  Cardiovascular: Normal rate, regular rhythm and normal heart sounds.  Exam reveals no gallop and no friction rub.   No murmur heard. Pulses:      Dorsalis pedis pulses are 3+ on the right side, and 3+ on the left side.       1+ bilateral ankle edema   Pulmonary/Chest: Effort normal and breath sounds normal. No respiratory distress. She has no wheezes. She has no rales.  Abdominal: Soft. Bowel sounds are normal. She exhibits no distension. There is no tenderness. There is no rebound and no guarding.  Musculoskeletal: She exhibits no tenderness.  Skin:          Assessment & Plan:

## 2011-12-01 NOTE — Assessment & Plan Note (Signed)
Lab Results  Component Value Date   NA 142 08/11/2011   K 3.9 08/11/2011   CL 105 08/11/2011   CO2 25 08/11/2011   BUN 10 08/11/2011   CREATININE 0.53 08/11/2011    BP Readings from Last 3 Encounters:  12/01/11 131/76  08/11/11 136/78  05/05/11 148/84    Assessment: Hypertension control:  controlled  Progress toward goals:  at goal Barriers to meeting goals:  no barriers identified  Plan: Hypertension treatment:  continue current medications

## 2011-12-01 NOTE — Assessment & Plan Note (Signed)
Lipids:    Component Value Date/Time   CHOL 221* 08/11/2011 0939   TRIG 55 08/11/2011 0939   HDL 60 08/11/2011 0939   LDLCALC 150* 08/11/2011 0939   VLDL 11 08/11/2011 0939   CHOLHDL 3.7 08/11/2011 0939   Assessment: LDL was above target range when checked in February; at that time, I increased her pravastatin to a dose of 40 mg daily.  She is currently doing well on that dose on pravastatin without apparent side effects.  Plan: Patient will return for fasting labs to include a fasting lipid panel within the next week; will continue pravastatin 40 mg daily pending those results.

## 2011-12-06 ENCOUNTER — Other Ambulatory Visit: Payer: Self-pay | Admitting: Internal Medicine

## 2011-12-07 ENCOUNTER — Other Ambulatory Visit: Payer: PRIVATE HEALTH INSURANCE

## 2012-03-09 ENCOUNTER — Other Ambulatory Visit: Payer: PRIVATE HEALTH INSURANCE

## 2012-03-13 ENCOUNTER — Other Ambulatory Visit: Payer: PRIVATE HEALTH INSURANCE

## 2012-03-15 ENCOUNTER — Other Ambulatory Visit (INDEPENDENT_AMBULATORY_CARE_PROVIDER_SITE_OTHER): Payer: PRIVATE HEALTH INSURANCE

## 2012-03-15 DIAGNOSIS — E785 Hyperlipidemia, unspecified: Secondary | ICD-10-CM

## 2012-03-15 DIAGNOSIS — R609 Edema, unspecified: Secondary | ICD-10-CM

## 2012-03-15 LAB — COMPLETE METABOLIC PANEL WITH GFR
AST: 13 U/L (ref 0–37)
Alkaline Phosphatase: 125 U/L — ABNORMAL HIGH (ref 39–117)
BUN: 10 mg/dL (ref 6–23)
Creat: 0.56 mg/dL (ref 0.50–1.10)
GFR, Est Non African American: >89 mL/min

## 2012-03-15 LAB — LIPID PANEL
Cholesterol: 190 mg/dL (ref 0–200)
Total CHOL/HDL Ratio: 3.5 Ratio
Triglycerides: 51 mg/dL (ref <150)
VLDL: 10 mg/dL (ref 0–40)

## 2012-03-16 ENCOUNTER — Encounter: Payer: Self-pay | Admitting: Internal Medicine

## 2012-03-28 ENCOUNTER — Other Ambulatory Visit: Payer: Self-pay | Admitting: Internal Medicine

## 2012-03-28 DIAGNOSIS — Z1231 Encounter for screening mammogram for malignant neoplasm of breast: Secondary | ICD-10-CM

## 2012-04-01 ENCOUNTER — Other Ambulatory Visit: Payer: Self-pay | Admitting: Internal Medicine

## 2012-04-06 ENCOUNTER — Ambulatory Visit (HOSPITAL_COMMUNITY): Payer: PRIVATE HEALTH INSURANCE

## 2012-04-12 ENCOUNTER — Ambulatory Visit (HOSPITAL_COMMUNITY): Payer: PRIVATE HEALTH INSURANCE

## 2012-04-27 ENCOUNTER — Other Ambulatory Visit: Payer: Self-pay | Admitting: Internal Medicine

## 2012-05-03 ENCOUNTER — Ambulatory Visit (HOSPITAL_COMMUNITY): Payer: PRIVATE HEALTH INSURANCE

## 2012-05-10 ENCOUNTER — Other Ambulatory Visit: Payer: Self-pay | Admitting: Internal Medicine

## 2012-05-31 ENCOUNTER — Encounter: Payer: PRIVATE HEALTH INSURANCE | Admitting: Internal Medicine

## 2012-05-31 ENCOUNTER — Encounter: Payer: Self-pay | Admitting: Internal Medicine

## 2012-05-31 ENCOUNTER — Ambulatory Visit (INDEPENDENT_AMBULATORY_CARE_PROVIDER_SITE_OTHER): Payer: PRIVATE HEALTH INSURANCE | Admitting: Internal Medicine

## 2012-05-31 VITALS — BP 128/77 | HR 81 | Temp 98.3°F | Wt 232.2 lb

## 2012-05-31 DIAGNOSIS — Z23 Encounter for immunization: Secondary | ICD-10-CM

## 2012-05-31 DIAGNOSIS — I10 Essential (primary) hypertension: Secondary | ICD-10-CM

## 2012-05-31 DIAGNOSIS — R609 Edema, unspecified: Secondary | ICD-10-CM

## 2012-05-31 DIAGNOSIS — E785 Hyperlipidemia, unspecified: Secondary | ICD-10-CM

## 2012-05-31 DIAGNOSIS — E559 Vitamin D deficiency, unspecified: Secondary | ICD-10-CM

## 2012-05-31 LAB — COMPLETE METABOLIC PANEL WITH GFR
AST: 13 U/L (ref 0–37)
BUN: 12 mg/dL (ref 6–23)
Calcium: 9.8 mg/dL (ref 8.4–10.5)
Chloride: 105 mEq/L (ref 96–112)
Creat: 0.52 mg/dL (ref 0.50–1.10)
GFR, Est Non African American: >89 mL/min
Glucose, Bld: 110 mg/dL — ABNORMAL HIGH (ref 70–99)

## 2012-05-31 MED ORDER — DIGOXIN 125 MCG PO TABS: 125.0000 ug | ORAL_TABLET | Freq: Every day | ORAL | Status: DC

## 2012-05-31 NOTE — Assessment & Plan Note (Signed)
Lipids:    Component Value Date/Time   CHOL 190 03/15/2012 1106   TRIG 51 03/15/2012 1106   HDL 55 03/15/2012 1106   LDLCALC 125* 03/15/2012 1106   VLDL 10 03/15/2012 1106   CHOLHDL 3.5 03/15/2012 1106    Assessment: Patient's LDL is at goal on her current dose of pravastatin 40 mg daily.  She has no apparent side effects of the medication.  Plan: Continue pravastatin 40 mg daily.

## 2012-05-31 NOTE — Assessment & Plan Note (Signed)
Assessment: Patient reports that she has been compliant with her low dose vitamin D supplement.  Plan:  Will check a vitamin D level today.

## 2012-05-31 NOTE — Progress Notes (Signed)
  Subjective:    Patient ID: [name], female    DOB: 1941/03/22, 71 y.o.   MRN: [id number]  HPI Patient returns for followup of her hyperlipidemia, hypertension, bilateral lower extremity edema, and other chronic medical problems.  She reports that she has been doing well.  She has no acute complaints today.  She reports that she has been compliant with her medications.  She has not yet started wearing support stockings for her edema.  She reports that the edema goes away when she elevates her legs, and is somewhat better than at the time of her last visit.  She has no dyspnea, orthopnea or PND.     Review of Systems  Constitutional: Negative for fever, chills, diaphoresis and appetite change.  HENT: Negative for hearing loss.   Eyes: Negative for visual disturbance.  Respiratory: Negative for cough, chest tightness and shortness of breath.   Cardiovascular: Negative for chest pain.  Gastrointestinal: Negative for nausea, vomiting, abdominal pain and blood in stool.  Genitourinary: Negative for dysuria and frequency.  Musculoskeletal: Positive for arthralgias (Right knee off and on, chronic, sees Dr. Turner Daniels (orthopedics)). Negative for back pain.  Neurological: Negative for dizziness, syncope, weakness, numbness and headaches.       Objective:   Physical Exam  Constitutional: No distress.  Cardiovascular: Normal rate, regular rhythm and normal heart sounds.  Exam reveals no gallop and no friction rub.   No murmur heard.      1+ bilateral ankle edema  Pulmonary/Chest: Effort normal and breath sounds normal. She has no wheezes. She has no rales.  Abdominal: Soft. Bowel sounds are normal. She exhibits no distension. There is no tenderness. There is no guarding.       Assessment & Plan:

## 2012-05-31 NOTE — Patient Instructions (Addendum)
General Instructions: Continue current medications.   Treatment Goals:  Goals (1 Years of Data) as of 05/31/2012          As of Today 03/15/12 12/01/11 08/11/11 05/05/11     Blood Pressure    . Blood Pressure < 140/90  128/77  131/76 136/78 148/84     Result Component    . LDL CALC < 130   125  150 161      Progress Toward Treatment Goals:  Treatment Goal 05/31/2012  Blood pressure at goal    Self Care Goals & Plans:  Self Care Goal 05/31/2012  Manage my medications bring my medications to every visit; take my medicines as prescribed  Eat healthy foods eat foods that are low in salt; eat smaller portions; eat baked foods instead of fried foods       Care Management & Community Referrals:  Referral 05/31/2012  Referrals made for care management support none needed

## 2012-05-31 NOTE — Assessment & Plan Note (Signed)
Assessment: Patient's bilateral ankle edema is somewhat better  since her last visit.  As previously noted, an echocardiogram done in 2012 showed normal left ventricular function.    Plan: I advised patient try bilateral compression stockings, and continue furosemide 40 mg daily.  Will check a comprehensive metabolic panel today.

## 2012-05-31 NOTE — Assessment & Plan Note (Signed)
BP Readings from Last 3 Encounters:  05/31/12 128/77  12/01/11 131/76  08/11/11 136/78    Lab Results  Component Value Date   NA 144 03/15/2012   K 4.2 03/15/2012   CREATININE 0.56 03/15/2012    Assessment:  Blood pressure control: controlled  Progress toward BP goal:  at goal  Comments: Patient's blood pressure is at goal on her current regimen of atenolol 50 mg daily, clonidine 0.1 mg daily, and furosemide 40 mg daily.  Plan:  Medications:  continue current medications  Educational resources provided: brochure  Self management tools provided: home blood pressure logbook  Other plans: check a metabolic panel today

## 2012-06-01 ENCOUNTER — Telehealth: Payer: Self-pay | Admitting: Internal Medicine

## 2012-06-01 DIAGNOSIS — R739 Hyperglycemia, unspecified: Secondary | ICD-10-CM | POA: Insufficient documentation

## 2012-06-01 NOTE — Telephone Encounter (Signed)
Patient has mild hyperglycemia.  I called her today and discussed this with her.  I advised her to avoid concentrated sweets.  She will come in for a hemoglobin A1c check.

## 2012-06-01 NOTE — Assessment & Plan Note (Signed)
Lab Results  Component Value Date   GLUCOSE 110* 05/31/2012   GLUCOSE 104* 03/15/2012   GLUCOSE 114* 08/11/2011   GLUCOSE 93 05/05/2011     Patient has mild hyperglycemia.  I called her today and discussed this with her.  I advised her to avoid concentrated sweets.  She will come in for a hemoglobin A1c check.

## 2012-08-24 ENCOUNTER — Encounter: Payer: Self-pay | Admitting: Internal Medicine

## 2012-08-24 ENCOUNTER — Telehealth: Payer: Self-pay | Admitting: *Deleted

## 2012-08-24 ENCOUNTER — Ambulatory Visit (INDEPENDENT_AMBULATORY_CARE_PROVIDER_SITE_OTHER): Payer: PRIVATE HEALTH INSURANCE | Admitting: Internal Medicine

## 2012-08-24 VITALS — BP 141/81 | HR 75 | Temp 98.0°F | Ht 68.0 in | Wt 233.2 lb

## 2012-08-24 DIAGNOSIS — R609 Edema, unspecified: Secondary | ICD-10-CM

## 2012-08-24 DIAGNOSIS — I10 Essential (primary) hypertension: Secondary | ICD-10-CM

## 2012-08-24 NOTE — Telephone Encounter (Signed)
Pt called stating she was concerned with bil leg and feet swelling. Onset of increase fluid 2 weeks ago.  She is having to wear different shoes. Denies SOB.  She is taking all her meds, including lasix  Clinic appointment given for today @ 1:45

## 2012-08-24 NOTE — Telephone Encounter (Signed)
Agree with plan for her to be seen today.

## 2012-08-24 NOTE — Patient Instructions (Signed)
Blood work today.  We will schedule you for an echocardiogram (picture of the heart).  Take 60 mg of Lasix (3 tablets) through the weekend and then we will talk again Monday on the phone.

## 2012-08-25 ENCOUNTER — Encounter: Payer: Self-pay | Admitting: Internal Medicine

## 2012-08-25 LAB — COMPLETE METABOLIC PANEL WITH GFR
ALT: 9 U/L (ref 0–35)
AST: 13 U/L (ref 0–37)
Albumin: 4.1 g/dL (ref 3.5–5.2)
Alkaline Phosphatase: 135 U/L — ABNORMAL HIGH (ref 39–117)
Calcium: 9.8 mg/dL (ref 8.4–10.5)
GFR, Est Non African American: >89 mL/min
Glucose, Bld: 118 mg/dL — ABNORMAL HIGH (ref 70–99)
Potassium: 3.7 mEq/L (ref 3.5–5.3)
Sodium: 141 mEq/L (ref 135–145)
Total Protein: 6.3 g/dL (ref 6.0–8.3)

## 2012-08-25 NOTE — Progress Notes (Signed)
  Subjective:    Patient ID: [name], female    DOB: Aug 07, 1940, 72 y.o.   MRN: [id number]  HPI:  This is a 72 year old woman with hypertension and hyperlipidemia who comes to the clinic because of lower extremity swelling.  She has had off and on leg edema for the past year or two.  This time, she has noticed for the past month, waxing and waning, but it has been persistent for the past four days.  It is not associated with fevers, chills, dizziness, chest pain, worsened dyspnea, worsened orthopnea, palpitations, abdominal pain or swelling, leg pain, or skin changes.  An echocardiogram was performed for this problem in September 2012: EF 50-55% with normal systolic function.  Liver function, protein, albumin, and kidney function have always been normal.   Review of Systems  Constitutional: Negative.  Negative for fever, chills and unexpected weight change.  Respiratory: Negative.  Negative for cough and shortness of breath.   Cardiovascular: Positive for leg swelling. Negative for chest pain and palpitations.  Gastrointestinal: Negative.  Negative for abdominal pain and abdominal distention.  Genitourinary: Negative.  Negative for difficulty urinating.  Skin: Negative.  Negative for color change and rash.  Neurological: Negative.  Negative for dizziness and headaches.    Current Medications: 1. Atenolol 50mg  QD 2. Cholecalciferol 3,000 units QD 3. Clonidine 0.1mg  QD 4. Cyanocobalamin QD 5. Digoxin 0.125mg  QD 6. Furosemide 40mg  QD 7. Potassium chloride QD 8. Pravastatin 40mg  QD      Objective:   Physical Exam:  GENERAL: overweight; no acute distress HEAD: atraumatic, normocephalic EYES: pupils equal, round and reactive; sclera anicteric; normal conjunctiva EARS: canals patent and TMs normal bilaterally NOSE/THROAT: oropharynx clear, moist mucous membranes, pink gingiva NECK: supple, thyroid normal in size and without palpable nodules LYMPH: no cervical or  supraclavicular lymphadenopathy LUNGS: clear to auscultation bilaterally, normal work of breathing HEART: normal rate and regular rhythm; normal S1 and S2 without S3 or S4; no murmurs, rubs, or clicks PULSES: radial and dorsalis pedis pulses are 2+ and symmetric ABDOMEN: soft, non-tender, normal bowel sounds MSK: no tenderness with palpation of the calves bilaterally, no venous cords palpated SKIN: warm, dry, intact, normal turgor, no rashes EXTREMITIES: 3+ pitting edema in the ankles, edema extends to 5cm above knees bilaterally; neurovascularly intact; no clubbing or cyanosis PSYCH: patient is alert and oriented, mood and affect are normal and congruent   Filed Vitals:   08/24/12 1440  BP: 141/81  Pulse: 75  Temp: 98 F (36.7 C)    BP Readings from Last 3 Encounters:  08/24/12 141/81  05/31/12 128/77  12/01/11 131/76    CMP  Component Value Date/Time   NA 141 08/24/2012 1535   K 3.7 08/24/2012 1535   CL 106 08/24/2012 1535   CO2 28 08/24/2012 1535   GLUCOSE 118* 08/24/2012 1535   BUN 12 08/24/2012 1535   CREATININE 0.65 08/24/2012 1535   CALCIUM 9.8 08/24/2012 1535   PROT 6.3 08/24/2012 1535   ALBUMIN 4.1 08/24/2012 1535   AST 13 08/24/2012 1535   ALT 9 08/24/2012 1535   ALKPHOS 135* 08/24/2012 1535   BILITOT 0.5 08/24/2012 1535   GFRAA >89 08/24/2012 1535        Assessment & Plan:

## 2012-08-25 NOTE — Assessment & Plan Note (Addendum)
Perhaps venous insufficiency. No evidence of heart failure on exam or on prior echocardiogram. Liver and kidney function testing are normal.  Normal protein and albumin levels.  We will increase her furosemide to 60mg  daily, and I will call her Monday to reassess. - Repeat echocardiogram

## 2012-08-28 ENCOUNTER — Telehealth: Payer: Self-pay | Admitting: Internal Medicine

## 2012-08-28 NOTE — Telephone Encounter (Signed)
Spoke with patient regarding lower extremity edema and furosemide dose. Over the weekend, swelling has decreased. She has been taking 60 mg (3 tablets) of furosemide daily as instructed. During the phone call, I instructed her to again take 3 tablets today and then decrease to 2 tablets beginning tomorrow. I told her to call us back if the swelling returns or if she has any troubles. She denies dizziness or lightheadedness today.

## 2012-08-29 ENCOUNTER — Telehealth: Payer: Self-pay | Admitting: *Deleted

## 2012-08-29 NOTE — Telephone Encounter (Signed)
Agree with plan 

## 2012-08-29 NOTE — Telephone Encounter (Signed)
Pain in side worse, appt made for f/u dr Earlene Plater 3/12, reminded she may go to ED if needed, sghe is agreeable

## 2012-08-30 ENCOUNTER — Ambulatory Visit (INDEPENDENT_AMBULATORY_CARE_PROVIDER_SITE_OTHER): Payer: PRIVATE HEALTH INSURANCE | Admitting: Internal Medicine

## 2012-08-30 ENCOUNTER — Encounter: Payer: Self-pay | Admitting: Internal Medicine

## 2012-08-30 VITALS — BP 139/80 | HR 86 | Temp 99.0°F | Ht 67.0 in | Wt 230.3 lb

## 2012-08-30 DIAGNOSIS — M549 Dorsalgia, unspecified: Secondary | ICD-10-CM | POA: Insufficient documentation

## 2012-08-30 DIAGNOSIS — R609 Edema, unspecified: Secondary | ICD-10-CM

## 2012-08-30 MED ORDER — ACETAMINOPHEN 500 MG PO TABS: 500.0000 mg | ORAL_TABLET | Freq: Three times a day (TID) | ORAL | Status: DC | PRN

## 2012-08-30 NOTE — Progress Notes (Signed)
  Subjective:    Patient ID: [name], female    DOB: 1940/12/03, 72 y.o.   MRN: [id number]  Back Pain Pertinent negatives include no abdominal pain, dysuria, fever, numbness or weakness.  :  72 year-old female with a history of lumbar disc surgery in 2002 who comes to the clinic with back pain. She also has problems with lower extremity edema and is following up for this as well.  Back Pain Onset was 2 weeks ago. No provoking incident or injury identified by patient. Quality described as sharp. It is located in the right lateral lumbar back without radiation. Severity rated at 5/10. Pain is off and on but worse in the morning and at night. Aggravated by standing, bending, walking, and laying down in bed. Pain alleviated by sitting down. The treatments tried. Not associated with weakness, numbness, saddle paresthesias, loss of bowel or bladder control, fevers, chills, or weight loss.  Edema After taking 60 mg of furosemide rather than 40 mg for a few days, the swelling has decreased but has not resolved completely.   Review of Systems  Constitutional: Negative for fever, chills and unexpected weight change.  Gastrointestinal: Negative for abdominal pain.       Negative for bowel incontinence  Genitourinary: Negative for dysuria, hematuria, flank pain and difficulty urinating.       Negative for urinary incontinence  Musculoskeletal: Positive for back pain.  Neurological: Negative for dizziness, weakness and numbness.    Current medications: 1. atenolol 50 mg daily 2. Cholecalciferol 3000 units daily 3. Clonidine 0.1 mg daily 4. Cyanocobalamin 500 mcg sublingual daily 5. Digoxin 125 mcg daily 6. Furosemide 40 mg daily 7. Potassium chloride 20 mEq daily 8. Meloxicam 15 mg daily as needed (about one day per week) 9. Pravastatin 40 mg daily 10. Tramadol 50 mg every 8 hours as needed (not currently taking)     Objective:   Physical Exam: GENERAL: overweight; no acute  distress LUNGS: clear to auscultation bilaterally, normal work of breathing HEART: normal rate and regular rhythm; normal S1 and S2 without S3 or S4; no murmurs, rubs, or clicks PULSES: radial 2+ and symmetric ABDOMEN: soft, non-tender, normal bowel sounds MSK: minimal pain with flexion and extension of the lumbar spine; no pain with left lateral flexion of the lumbar spine; severe pain and restricted range of motion with right lateral flexion of the lumbar spine; no pain with rotation; no tenderness, step-offs, or deformities appreciated with palpation of the bony spine; no tenderness with palpation of the paraspinal musculature and no muscle spasm appreciated MOTOR: 5 out of 5 dorsiflexion, plantarflexion, leg extension, and leg flexion SENSATION: intact REFLEXES: 2+ patellar reflexes, equal bilaterally SKIN: warm, dry, intact, normal turgor, no rashes EXTREMITIES: 1+ pitting edema in the ankles bilaterally, clubbing, or cyanosis   Filed Vitals:   08/30/12 1429  BP: 139/80  Pulse: 86  Temp: 99 F (37.2 C)    BP Readings from Last 3 Encounters:  08/30/12 139/80  08/24/12 141/81  05/31/12 128/77         Assessment & Plan:

## 2012-08-30 NOTE — Assessment & Plan Note (Signed)
Lumbago the most likely diagnosis. No evidence of radiculopathy, sciatica, myelopathy, or cauda equina syndrome. She was given meloxicam and tramadol by her surgeon Dr. Turner Daniels. I have instructed her to take meloxicam every day scheduled and supplement with acetaminophen and tramadol as needed. We are referring her to physical therapy. She has been instructed to contact Dr. Turner Daniels if symptoms do not improve in a month. - Meloxicam 15 mg daily - Acetaminophen 1000 mg every 8 hours as needed - Tramadol 50 mg every 8 hours as needed - Physical therapy

## 2012-08-30 NOTE — Patient Instructions (Addendum)
Start taking MELOXICAM every day. Take one 50 mg tablet every morning regardless of pain.  You can take ACETAMINOPHEN (TYLENOL) for pain too.  Remember not to take more than 3,000mg  of acetaminophen in 24 hours.  Many other medicines including cold medicines and other pain medicines have acetaminophen in them; so make sure that the total dose from all sources does not add up to more than 3,000mg .  For pain not controlled by meloxicam and acetaminophen, you can take the TRAMADOL you have home.  Take one 50 mg tablet every 8 hours as needed.  If your pain worsens or does not improve after 2-4 weeks, you can come back to see [place] or call Dr. Channing Mutters and see him.   For your leg swelling, go back to taking two FUROSEMIDE (LASIX) tablets every day.  I will call you again on Monday to check in with you.   SEEK IMMEDIATE MEDICAL CARE IF:  You have pain that radiates from your back into your legs.  You develop new bowel or bladder control problems.  You have unusual weakness or numbness in your arms or legs.  You develop nausea or vomiting.  You develop abdominal pain.  You feel faint.

## 2012-08-30 NOTE — Assessment & Plan Note (Addendum)
We will again try the increased dose of 60 mg of furosemide daily to diurese the residual lower extremity edema. I will call her again Monday to check in on this.  ADDENDUM: Echocardiogram from 09/15/2012 reveals normal systolic function but grade 2 diastolic dysfunction.

## 2012-09-01 ENCOUNTER — Other Ambulatory Visit: Payer: Self-pay | Admitting: Internal Medicine

## 2012-09-04 ENCOUNTER — Telehealth: Payer: Self-pay | Admitting: Internal Medicine

## 2012-09-04 NOTE — Telephone Encounter (Signed)
Patient's back pain is much improved. She still has an occasional twinge every now and then. I recommended she continue taking meloxicam 15 mg every day for another week or two and then decrease back to PRN dosing as it was prescribed by her surgeon.  Her swelling is also much improved. It is slightly up today because she went to church yesterday and did not take her Lasix as is her custom. I advised her to continue taking 2 tablets (40 mg) every day. I told her that if she needed an occasional third pill (60 mg a day) that would be okay, but that if she needed to do this more than a day or 2 or she needs to give [place] a call.

## 2012-09-12 ENCOUNTER — Ambulatory Visit: Payer: PRIVATE HEALTH INSURANCE | Admitting: Physical Therapy

## 2012-09-15 ENCOUNTER — Ambulatory Visit (HOSPITAL_COMMUNITY)
Admission: RE | Admit: 2012-09-15 | Discharge: 2012-09-15 | Disposition: A | Discharge status: Home / Self Care | Payer: PRIVATE HEALTH INSURANCE | Source: Ambulatory Visit | Attending: Internal Medicine | Admitting: Internal Medicine

## 2012-09-15 DIAGNOSIS — I10 Essential (primary) hypertension: Secondary | ICD-10-CM | POA: Insufficient documentation

## 2012-09-15 DIAGNOSIS — I517 Cardiomegaly: Secondary | ICD-10-CM

## 2012-09-15 DIAGNOSIS — E785 Hyperlipidemia, unspecified: Secondary | ICD-10-CM | POA: Insufficient documentation

## 2012-09-15 DIAGNOSIS — R609 Edema, unspecified: Secondary | ICD-10-CM

## 2012-09-15 DIAGNOSIS — M7989 Other specified soft tissue disorders: Secondary | ICD-10-CM | POA: Insufficient documentation

## 2012-09-15 DIAGNOSIS — I059 Rheumatic mitral valve disease, unspecified: Secondary | ICD-10-CM | POA: Insufficient documentation

## 2012-09-15 NOTE — Progress Notes (Signed)
*  PRELIMINARY RESULTS* Echocardiogram 2D Echocardiogram has been performed.  [name] 09/15/2012, 12:02 PM

## 2012-10-05 ENCOUNTER — Ambulatory Visit: Payer: PRIVATE HEALTH INSURANCE

## 2012-10-09 ENCOUNTER — Ambulatory Visit: Payer: PRIVATE HEALTH INSURANCE

## 2012-10-16 ENCOUNTER — Ambulatory Visit: Payer: PRIVATE HEALTH INSURANCE | Attending: Internal Medicine | Admitting: Physical Therapy

## 2012-10-23 ENCOUNTER — Ambulatory Visit: Payer: PRIVATE HEALTH INSURANCE | Admitting: Physical Therapy

## 2012-10-23 NOTE — Addendum Note (Signed)
Addended by: Neomia Dear on: 10/23/2012 07:28 PM   Modules accepted: Orders

## 2012-10-31 ENCOUNTER — Other Ambulatory Visit: Payer: Self-pay | Admitting: Internal Medicine

## 2012-11-01 ENCOUNTER — Other Ambulatory Visit: Payer: Self-pay | Admitting: Internal Medicine

## 2012-11-02 NOTE — Telephone Encounter (Signed)
I refilled this medication on 5/14 with additional refills; please confirm that the pharmacy received the prescription.

## 2012-12-07 ENCOUNTER — Emergency Department (HOSPITAL_COMMUNITY)
Admission: EM | Admit: 2012-12-07 | Discharge: 2012-12-07 | Disposition: A | Discharge status: Home / Self Care | Payer: No Typology Code available for payment source | Attending: Emergency Medicine | Admitting: Emergency Medicine

## 2012-12-07 ENCOUNTER — Encounter (HOSPITAL_COMMUNITY): Payer: Self-pay | Admitting: Emergency Medicine

## 2012-12-07 DIAGNOSIS — M549 Dorsalgia, unspecified: Secondary | ICD-10-CM

## 2012-12-07 DIAGNOSIS — Z79899 Other long term (current) drug therapy: Secondary | ICD-10-CM | POA: Insufficient documentation

## 2012-12-07 DIAGNOSIS — Z87891 Personal history of nicotine dependence: Secondary | ICD-10-CM | POA: Insufficient documentation

## 2012-12-07 DIAGNOSIS — S239XXA Sprain of unspecified parts of thorax, initial encounter: Secondary | ICD-10-CM | POA: Insufficient documentation

## 2012-12-07 DIAGNOSIS — Z8739 Personal history of other diseases of the musculoskeletal system and connective tissue: Secondary | ICD-10-CM | POA: Insufficient documentation

## 2012-12-07 DIAGNOSIS — I10 Essential (primary) hypertension: Secondary | ICD-10-CM | POA: Insufficient documentation

## 2012-12-07 DIAGNOSIS — E559 Vitamin D deficiency, unspecified: Secondary | ICD-10-CM | POA: Insufficient documentation

## 2012-12-07 DIAGNOSIS — E785 Hyperlipidemia, unspecified: Secondary | ICD-10-CM | POA: Insufficient documentation

## 2012-12-07 DIAGNOSIS — Z8679 Personal history of other diseases of the circulatory system: Secondary | ICD-10-CM | POA: Insufficient documentation

## 2012-12-07 DIAGNOSIS — IMO0002 Reserved for concepts with insufficient information to code with codable children: Secondary | ICD-10-CM | POA: Insufficient documentation

## 2012-12-07 DIAGNOSIS — Y9389 Activity, other specified: Secondary | ICD-10-CM | POA: Insufficient documentation

## 2012-12-07 DIAGNOSIS — S29019A Strain of muscle and tendon of unspecified wall of thorax, initial encounter: Secondary | ICD-10-CM

## 2012-12-07 DIAGNOSIS — Z862 Personal history of diseases of the blood and blood-forming organs and certain disorders involving the immune mechanism: Secondary | ICD-10-CM | POA: Insufficient documentation

## 2012-12-07 DIAGNOSIS — G8929 Other chronic pain: Secondary | ICD-10-CM | POA: Insufficient documentation

## 2012-12-07 DIAGNOSIS — Z8669 Personal history of other diseases of the nervous system and sense organs: Secondary | ICD-10-CM | POA: Insufficient documentation

## 2012-12-07 DIAGNOSIS — Z880 Allergy status to penicillin: Secondary | ICD-10-CM | POA: Insufficient documentation

## 2012-12-07 DIAGNOSIS — Y92410 Unspecified street and highway as the place of occurrence of the external cause: Secondary | ICD-10-CM | POA: Insufficient documentation

## 2012-12-07 DIAGNOSIS — Z8709 Personal history of other diseases of the respiratory system: Secondary | ICD-10-CM | POA: Insufficient documentation

## 2012-12-07 DIAGNOSIS — Z87448 Personal history of other diseases of urinary system: Secondary | ICD-10-CM | POA: Insufficient documentation

## 2012-12-07 MED ORDER — METHOCARBAMOL 750 MG PO TABS: 750.0000 mg | ORAL_TABLET | Freq: Four times a day (QID) | ORAL | Status: DC | PRN
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
## 2012-12-07 NOTE — ED Provider Notes (Signed)
Medical screening examination/treatment/procedure(s) were performed by non-physician practitioner and as supervising physician I was immediately available for consultation/collaboration.  Addylynn Balin M Terrell Shimko, MD 12/07/12 1954 

## 2012-12-13 ENCOUNTER — Encounter: Payer: Self-pay | Admitting: Internal Medicine

## 2012-12-13 ENCOUNTER — Ambulatory Visit (INDEPENDENT_AMBULATORY_CARE_PROVIDER_SITE_OTHER): Payer: PRIVATE HEALTH INSURANCE | Admitting: Internal Medicine

## 2012-12-13 VITALS — BP 141/81 | HR 67 | Temp 98.1°F | Ht 67.0 in | Wt 231.6 lb

## 2012-12-13 DIAGNOSIS — R7309 Other abnormal glucose: Secondary | ICD-10-CM

## 2012-12-13 DIAGNOSIS — E559 Vitamin D deficiency, unspecified: Secondary | ICD-10-CM

## 2012-12-13 DIAGNOSIS — R609 Edema, unspecified: Secondary | ICD-10-CM

## 2012-12-13 DIAGNOSIS — M549 Dorsalgia, unspecified: Secondary | ICD-10-CM

## 2012-12-13 DIAGNOSIS — R739 Hyperglycemia, unspecified: Secondary | ICD-10-CM

## 2012-12-13 DIAGNOSIS — I10 Essential (primary) hypertension: Secondary | ICD-10-CM

## 2012-12-13 DIAGNOSIS — E785 Hyperlipidemia, unspecified: Secondary | ICD-10-CM

## 2012-12-13 LAB — CBC WITH DIFFERENTIAL/PLATELET
Basophils Relative: 0 % (ref 0–1)
Eosinophils Absolute: 0.1 10*3/uL (ref 0.0–0.7)
Eosinophils Relative: 3 % (ref 0–5)
HCT: 35.2 % — ABNORMAL LOW (ref 36.0–46.0)
Lymphs Abs: 1.7 10*3/uL (ref 0.7–4.0)
MCH: 27.1 pg (ref 26.0–34.0)
MCHC: 32.7 g/dL (ref 30.0–36.0)
Monocytes Absolute: 0.3 10*3/uL (ref 0.1–1.0)
Monocytes Relative: 6 % (ref 3–12)
WBC: 4.6 10*3/uL (ref 4.0–10.5)

## 2012-12-13 LAB — COMPLETE METABOLIC PANEL WITH GFR
ALT: 11 U/L (ref 0–35)
CO2: 28 mEq/L (ref 19–32)
Creat: 0.6 mg/dL (ref 0.50–1.10)
GFR, Est African American: >89 mL/min
GFR, Est Non African American: >89 mL/min
Total Bilirubin: 0.8 mg/dL (ref 0.3–1.2)
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
## 2012-12-13 NOTE — Assessment & Plan Note (Signed)
Lipids:    Component Value Date/Time   CHOL 190 03/15/2012 1106   TRIG 51 03/15/2012 1106   HDL 55 03/15/2012 1106   LDLCALC 125* 03/15/2012 1106   VLDL 10 03/15/2012 1106   CHOLHDL 3.5 03/15/2012 1106    Assessment: LDL is at goal on pravastatin 40 mg daily.  She has no apparent side effects of the medication.  Plan: Continue pravastatin 40 mg daily.

## 2012-12-13 NOTE — Assessment & Plan Note (Signed)
Assessment: Patient's low back pain is better; she is currently doing well on meloxicam 15 mg daily.  Plan: Continue meloxicam 15 mg daily.  Patient will follow up with her orthopedic surgeon Dr. Turner Daniels as scheduled.

## 2012-12-13 NOTE — Assessment & Plan Note (Signed)
Lab Results  Component Value Date   GLUCOSE 118* 08/24/2012   GLUCOSE 110* 05/31/2012   GLUCOSE 104* 03/15/2012     Assessment: Patient has had mild hyperglycemia on several labs.  I discussed this again with her today, and emphasized the importance of lifestyle changes including exercise, weight loss if possible, and avoiding concentrated sweets.  Plan: Will check a hemoglobin A1c today.  We provided educational information regarding lifestyle changes and a healthy diet.

## 2012-12-13 NOTE — Assessment & Plan Note (Signed)
Assessment: Results of 2-D echocardiogram on 09/15/2012 were consistent with diastolic dysfunction, which is the likely cause of patient's leg edema.  Her edema has improved compared to her last visit, and appears to be stable on her current furosemide dose of 40 mg daily.  Plan: Continue furosemide 40 mg daily; check a comprehensive metabolic panel and CBC today.

## 2012-12-13 NOTE — Assessment & Plan Note (Signed)
Assessment: Patient is taking a  low dose vitamin D supplement.  Plan:  Will check a vitamin D level today.

## 2012-12-13 NOTE — Assessment & Plan Note (Signed)
BP Readings from Last 3 Encounters:  12/13/12 141/81  12/07/12 143/80  08/30/12 139/80    Lab Results  Component Value Date   NA 141 08/24/2012   K 3.7 08/24/2012   CREATININE 0.65 08/24/2012    Assessment: Blood pressure control: mildly elevated Progress toward BP goal:  unchanged Comments: Patient is essentially at goal on atenolol 50 mg daily, clonidine 0.1 mg daily, and furosemide 40 mg daily.  Plan: Medications:  continue current medications Educational resources provided: brochure Self management tools provided: home blood pressure logbook

## 2012-12-13 NOTE — Patient Instructions (Signed)
General Instructions: Continue current medications.    Treatment Goals:  Goals (1 Years of Data) as of 12/13/12         As of Today 12/07/12 12/07/12 08/30/12 08/24/12     Blood Pressure    . Blood Pressure < 140/90  141/81 143/80 156/90 139/80 141/81     Result Component    . LDL CALC < 130            Progress Toward Treatment Goals:  Treatment Goal 12/13/2012  Blood pressure unchanged    Self Care Goals & Plans:  Self Care Goal 08/30/2012  Manage my medications take my medicines as prescribed; refill my medications on time  Eat healthy foods eat baked foods instead of fried foods; eat foods that are low in salt  Be physically active take a walk every day       Care Management & Community Referrals:  Referral 12/13/2012  Referrals made for care management support none needed  Referrals made to community resources none

## 2012-12-14 LAB — VITAMIN D 25 HYDROXY (VIT D DEFICIENCY, FRACTURES): Vit D, 25-Hydroxy: 18 ng/mL — ABNORMAL LOW (ref 30–89)

## 2012-12-15 ENCOUNTER — Other Ambulatory Visit: Payer: Self-pay | Admitting: Internal Medicine

## 2012-12-19 ENCOUNTER — Other Ambulatory Visit: Payer: Self-pay | Admitting: Internal Medicine

## 2012-12-29 ENCOUNTER — Telehealth: Payer: Self-pay | Admitting: Internal Medicine

## 2012-12-29 MED ORDER — VITAMIN D3 25 MCG (1000 UT) PO CAPS: 3.0000 | ORAL_CAPSULE | Freq: Every day | ORAL | Status: DC

## 2012-12-29 NOTE — Telephone Encounter (Signed)
Patient's vitamin D level done 6/25 was low at 18, on a Vitamin D3 dose of 1,000 units daily.  I called patient and advised her to increase the Vitamin D3 dose to 3 capsules (=3,000 units) daily.

## 2013-03-19 ENCOUNTER — Other Ambulatory Visit: Payer: Self-pay | Admitting: *Deleted

## 2013-03-19 MED ORDER — PRAVASTATIN SODIUM 40 MG PO TABS: 40.0000 mg | ORAL_TABLET | Freq: Every day | ORAL | Status: DC

## 2013-03-19 MED ORDER — FUROSEMIDE 20 MG PO TABS: 40.0000 mg | ORAL_TABLET | Freq: Every day | ORAL | Status: DC

## 2013-03-19 NOTE — Telephone Encounter (Signed)
Fax from [facility] - requesting 90 day supply  Thanks

## 2013-03-24 ENCOUNTER — Other Ambulatory Visit: Payer: Self-pay | Admitting: Internal Medicine

## 2013-04-17 ENCOUNTER — Telehealth: Payer: Self-pay | Admitting: *Deleted

## 2013-04-17 ENCOUNTER — Ambulatory Visit (INDEPENDENT_AMBULATORY_CARE_PROVIDER_SITE_OTHER): Payer: PRIVATE HEALTH INSURANCE | Admitting: Internal Medicine

## 2013-04-17 ENCOUNTER — Encounter: Payer: Self-pay | Admitting: Internal Medicine

## 2013-04-17 VITALS — BP 149/79 | HR 72 | Temp 98.6°F | Ht 67.0 in | Wt 230.1 lb

## 2013-04-17 DIAGNOSIS — I10 Essential (primary) hypertension: Secondary | ICD-10-CM

## 2013-04-17 DIAGNOSIS — E559 Vitamin D deficiency, unspecified: Secondary | ICD-10-CM

## 2013-04-17 DIAGNOSIS — M549 Dorsalgia, unspecified: Secondary | ICD-10-CM

## 2013-04-17 MED ORDER — TRAMADOL HCL 50 MG PO TABS: 50.0000 mg | ORAL_TABLET | Freq: Four times a day (QID) | ORAL | Status: DC | PRN

## 2013-04-17 MED ORDER — VITAMIN D3 25 MCG (1000 UT) PO CAPS: 3.0000 | ORAL_CAPSULE | Freq: Every day | ORAL | Status: DC

## 2013-04-17 NOTE — Patient Instructions (Addendum)
** take the meloxicam daily.  ** Begin taking the Tramadol, 1 tablet every 6 hours as needed for pain  ** If your pain does not improve or worsens, please call the clinic. You may also need to be seen by your Orthopedist as well.  Back Exercises Back exercises help treat and prevent back injuries. The goal of back exercises is to increase the strength of your abdominal and back muscles and the flexibility of your back. These exercises should be started when you no longer have back pain. Back exercises include:  Pelvic Tilt. Lie on your back with your knees bent. Tilt your pelvis until the lower part of your back is against the floor. Hold this position 5 to 10 sec and repeat 5 to 10 times.  Knee to Chest. Pull first 1 knee up against your chest and hold for 20 to 30 seconds, repeat this with the other knee, and then both knees. This may be done with the other leg straight or bent, whichever feels better.  Sit-Ups or Curl-Ups. Bend your knees 90 degrees. Start with tilting your pelvis, and do a partial, slow sit-up, lifting your trunk only 30 to 45 degrees off the floor. Take at least 2 to 3 seconds for each sit-up. Do not do sit-ups with your knees out straight. If partial sit-ups are difficult, simply do the above but with only tightening your abdominal muscles and holding it as directed.  Hip-Lift. Lie on your back with your knees flexed 90 degrees. Push down with your feet and shoulders as you raise your hips a couple inches off the floor; hold for 10 seconds, repeat 5 to 10 times.  Back arches. Lie on your stomach, propping yourself up on bent elbows. Slowly press on your hands, causing an arch in your low back. Repeat 3 to 5 times. Any initial stiffness and discomfort should lessen with repetition over time.  Shoulder-Lifts. Lie face down with arms beside your body. Keep hips and torso pressed to floor as you slowly lift your head and shoulders off the floor. Do not overdo your exercises,  especially in the beginning. Exercises may cause you some mild back discomfort which lasts for a few minutes; however, if the pain is more severe, or lasts for more than 15 minutes, do not continue exercises until you see your caregiver. Improvement with exercise therapy for back problems is slow.  See your caregivers for assistance with developing a proper back exercise program. Document Released: 07/15/2004 Document Revised: 08/30/2011 Document Reviewed: 04/08/2011 [facility] Patient Information 2014 [place], [place]. Back Exercises Back exercises help treat and prevent back injuries. The goal of back exercises is to increase the strength of your abdominal and back muscles and the flexibility of your back. These exercises should be started when you no longer have back pain. Back exercises include:  Pelvic Tilt. Lie on your back with your knees bent. Tilt your pelvis until the lower part of your back is against the floor. Hold this position 5 to 10 sec and repeat 5 to 10 times.  Knee to Chest. Pull first 1 knee up against your chest and hold for 20 to 30 seconds, repeat this with the other knee, and then both knees. This may be done with the other leg straight or bent, whichever feels better.  Sit-Ups or Curl-Ups. Bend your knees 90 degrees. Start with tilting your pelvis, and do a partial, slow sit-up, lifting your trunk only 30 to 45 degrees off the floor. Take at least  2 to 3 seconds for each sit-up. Do not do sit-ups with your knees out straight. If partial sit-ups are difficult, simply do the above but with only tightening your abdominal muscles and holding it as directed.  Hip-Lift. Lie on your back with your knees flexed 90 degrees. Push down with your feet and shoulders as you raise your hips a couple inches off the floor; hold for 10 seconds, repeat 5 to 10 times.  Back arches. Lie on your stomach, propping yourself up on bent elbows. Slowly press on your hands, causing an arch in your low  back. Repeat 3 to 5 times. Any initial stiffness and discomfort should lessen with repetition over time.  Shoulder-Lifts. Lie face down with arms beside your body. Keep hips and torso pressed to floor as you slowly lift your head and shoulders off the floor. Do not overdo your exercises, especially in the beginning. Exercises may cause you some mild back discomfort which lasts for a few minutes; however, if the pain is more severe, or lasts for more than 15 minutes, do not continue exercises until you see your caregiver. Improvement with exercise therapy for back problems is slow.  See your caregivers for assistance with developing a proper back exercise program. Document Released: 07/15/2004 Document Revised: 08/30/2011 Document Reviewed: 04/08/2011 [facility] Patient Information 2014 [place], [place].

## 2013-04-17 NOTE — Telephone Encounter (Signed)
Pt called - problems with lower back past 4 days - problems with walking and sleeping - hurts to turn in bed. Denies any problems with burning or freq urination. Appt today with Dr Sherrine Maples 3:15PM.

## 2013-04-17 NOTE — Progress Notes (Signed)
Patient ID: [name], female   DOB: 07-10-1940, 72 y.o.   MRN: [id number]  Subjective:   Patient ID: [name] female   DOB: Sep 25, 1940 72 y.o.   MRN: [id number]  HPI: [name] is a 72 y.o. F with PMH HTN, HLD, DJD, and BLE edema presents to the clinic c/o of lower back pain.  Lumbosacral pain since Friday. She denies any heavy lifting, falls, or recent traumas. Was causing difficulty walking 2/2 pain. She denies bladder or bowel incontinence, and denies any pain radiating into her legs. She took a tramadol which helped "ease up" her pain. She later took a Mobic which had been prescribed by Dr. Turner Daniels, her Orthopedist, in the past for her back pain. This did seem to improve the pain. Overall, the pt states that her pain has improved since Fri.    Past Medical History  Diagnosis Date  . Hypertension   . Hyperlipemia   . DJD (degenerative joint disease)   . Chronic low back pain   . Abnormal electromyogram (EMG)     Low grade right S1 radiculopathy 7/93.  [name] Hot flashes   . Rotator cuff syndrome of right shoulder     S/P right shoulder arthroscopic debridement of a massive rotator cuff tear, greater tuberosity and resection of subacromial spur by Dr. Gean Birchwood 07/07/2005.  [name] Herniated lumbar intervertebral disc     S/P L4-5 & L5-S1 laminotomy, foraminotomy, and L5-S1 diskectomy by Dr. Trey Sailors 11/01/2000.  . Multiple joint pain   . Iron deficiency anemia   . Vitamin D deficiency   . SVT (supraventricular tachycardia)   . Bilateral leg edema   . Peripheral neuropathy   . Osteopenia   . Seasonal allergic rhinitis   . Elevated alkaline phosphatase level     Bone scan 08/2004 showed no evidence of abnormal bony activity..  . Skin tag     Right axillary area.  . Hypokalemia   . GLOVFIEP(329.5)    Current Outpatient Prescriptions  Medication Sig Dispense Refill  . atenolol (TENORMIN) 50 MG tablet TAKE 1 TABLET (50 MG TOTAL) BY MOUTH DAILY.  31 tablet  11  .  Cholecalciferol (VITAMIN D3) 1000 UNITS CAPS Take 3 capsules (3,000 Units total) by mouth daily.  90 capsule  6  . cloNIDine (CATAPRES) 0.1 MG tablet Take 1 tablet (0.1 mg total) by mouth daily.  30 tablet  9  . Cyanocobalamin (VITAMIN B-12) 500 MCG SUBL Place 1 tablet under the tongue daily.       [name] DIGOX 125 MCG tablet TAKE 1 TABLET (125 MCG TOTAL) BY MOUTH DAILY.  30 tablet  6  . furosemide (LASIX) 20 MG tablet Take 2 tablets (40 mg total) by mouth daily.  180 tablet  2  . KLOR-CON M20 20 MEQ tablet TAKE 1 TABLET (20 MEQ TOTAL) BY MOUTH DAILY.  30 tablet  5  . meloxicam (MOBIC) 15 MG tablet Take 15 mg by mouth daily.      . methocarbamol (ROBAXIN) 750 MG tablet Take 1 tablet (750 mg total) by mouth 4 (four) times daily as needed (Take 1 tablet every 6 hours as needed for muscle spasms.).  20 tablet  0  . pravastatin (PRAVACHOL) 40 MG tablet Take 1 tablet (40 mg total) by mouth daily.  90 tablet  2   No current facility-administered medications for this visit.   Family History  Problem Relation Age of Onset  . Ovarian cancer Mother 75  . Hypertension  Mother   . Heart attack Mother 74  . Hypertension Sister   . Hypertension Sister   . Breast cancer Neg Hx   . Colon cancer Neg Hx   . Lung cancer Neg Hx   . Cancer Brother   . Neuropathy Sister    History   Social History  . Marital Status: Single    Spouse Name: N/A    Number of Children: N/A  . Years of Education: N/A   Social History Main Topics  . Smoking status: Former Smoker -- 0.15 packs/day for 2 years    Quit date: 10/07/1999  . Smokeless tobacco: Never Used  . Alcohol Use: No  . Drug Use: No  . Sexual Activity: None   Other Topics Concern  . None   Social History Narrative  . None   Review of Systems: Constitutional: Denies fever, chills, diaphoresis.  Respiratory: Denies SOB, DOE, cough, chest tightness,  and wheezing.   Cardiovascular: Denies chest pain Gastrointestinal: Denies nausea, vomiting, abdominal  pain, diarrhea, constipation Genitourinary: Denies urinary sx Musculoskeletal: +back pain, improving  Neurological: Denies dizziness, syncope, weakness, light-headedness, numbness    Objective:  Physical Exam: Filed Vitals:   04/17/13 1531  BP: 149/79  Pulse: 72  Temp: 98.6 F (37 C)  TempSrc: Oral  Height: 5\' 7"  (1.702 m)  Weight: 230 lb 1.6 oz (104.373 kg)  SpO2: 98%   Constitutional: Vital signs reviewed.  Patient is a well-developed and well-nourished female in no acute distress and cooperative with exam.  Head: Normocephalic and atraumatic Eyes: PERRL, EOMI, conjunctivae normal, No scleral icterus.  Cardiovascular: RRR,  no MRG, pulses symmetric and intact bilaterally Pulmonary/Chest: normal respiratory effort, CTAB, no wheezes, rales, or rhonchi Abdominal: Soft. Non-tender, non-distended Musculoskeletal: No joint deformities, erythema. ROM full. Spine is nontender.  TTP lateral to the lumbosacral spine w/in the muscles.  Neurological: A&O x3, Strength is normal and symmetric bilaterally, cranial nerve II-XII are grossly intact, no focal motor deficit, sensory intact to light touch bilaterally.  Skin: Warm, dry and intact. No rash or bruising Psychiatric: Normal mood and affect. speech and behavior is normal.   Assessment & Plan:   Please refer to Problem List based Assessment and Plan

## 2013-04-20 NOTE — Assessment & Plan Note (Signed)
His medicines currently taking meloxicam daily, and did take one tramadol when the pain began, which did improve her back pain. She's not taken the tramadol since. She feels that her pain has improved from its onset. She denies any injury, recent falls, trauma, or heavy lifting. She is to continue to take the Mobic, and I told her I would like for her to take the tramadol every 6 hours as needed for pain. I also gave her some stretching exercises to perform, as I feel that this pain she is experiencing now is more musculoskeletal. - Mobic 15mg  po dailu - Tramadol 50mg  po q6h PRN pain - Stretching exercises

## 2013-04-20 NOTE — Assessment & Plan Note (Addendum)
Her blood pressure is elevated date of exam, but think this is likely secondary to her pain. She's to continue her current pressure regimen. She's to return in one month for a followup visit and to have her blood pressure rechecked. She has an appointment with her PCP, Dr. Meredith Pel in one month.

## 2013-04-20 NOTE — Progress Notes (Signed)
Case discussed with Dr. Glenn at the time of the visit. We reviewed the resident's history and exam and pertinent patient test results. I agree with the assessment, diagnosis and plan of care documented in the resident's note. 

## 2013-04-21 DIAGNOSIS — I639 Cerebral infarction, unspecified: Secondary | ICD-10-CM

## 2013-04-21 HISTORY — DX: Cerebral infarction, unspecified: I63.9

## 2013-05-08 ENCOUNTER — Inpatient Hospital Stay (HOSPITAL_COMMUNITY): Payer: PRIVATE HEALTH INSURANCE

## 2013-05-08 ENCOUNTER — Inpatient Hospital Stay (HOSPITAL_COMMUNITY)
Admission: EM | Admit: 2013-05-08 | Discharge: 2013-05-10 | DRG: 066 | Disposition: A | Discharge status: Home Health | Payer: PRIVATE HEALTH INSURANCE | Attending: Internal Medicine | Admitting: Internal Medicine

## 2013-05-08 ENCOUNTER — Emergency Department (HOSPITAL_COMMUNITY): Payer: PRIVATE HEALTH INSURANCE

## 2013-05-08 ENCOUNTER — Encounter (HOSPITAL_COMMUNITY): Payer: Self-pay | Admitting: Emergency Medicine

## 2013-05-08 ENCOUNTER — Telehealth: Payer: Self-pay | Admitting: *Deleted

## 2013-05-08 DIAGNOSIS — M545 Low back pain, unspecified: Secondary | ICD-10-CM | POA: Diagnosis present

## 2013-05-08 DIAGNOSIS — Z809 Family history of malignant neoplasm, unspecified: Secondary | ICD-10-CM

## 2013-05-08 DIAGNOSIS — R200 Anesthesia of skin: Secondary | ICD-10-CM

## 2013-05-08 DIAGNOSIS — I5189 Other ill-defined heart diseases: Secondary | ICD-10-CM | POA: Diagnosis present

## 2013-05-08 DIAGNOSIS — I6340 Cerebral infarction due to embolism of unspecified cerebral artery: Principal | ICD-10-CM | POA: Diagnosis present

## 2013-05-08 DIAGNOSIS — I4891 Unspecified atrial fibrillation: Secondary | ICD-10-CM | POA: Diagnosis present

## 2013-05-08 DIAGNOSIS — E785 Hyperlipidemia, unspecified: Secondary | ICD-10-CM | POA: Diagnosis present

## 2013-05-08 DIAGNOSIS — I10 Essential (primary) hypertension: Secondary | ICD-10-CM

## 2013-05-08 DIAGNOSIS — Z87891 Personal history of nicotine dependence: Secondary | ICD-10-CM

## 2013-05-08 DIAGNOSIS — I639 Cerebral infarction, unspecified: Secondary | ICD-10-CM

## 2013-05-08 DIAGNOSIS — E669 Obesity, unspecified: Secondary | ICD-10-CM | POA: Diagnosis present

## 2013-05-08 DIAGNOSIS — G459 Transient cerebral ischemic attack, unspecified: Secondary | ICD-10-CM

## 2013-05-08 DIAGNOSIS — E559 Vitamin D deficiency, unspecified: Secondary | ICD-10-CM | POA: Diagnosis present

## 2013-05-08 DIAGNOSIS — E876 Hypokalemia: Secondary | ICD-10-CM

## 2013-05-08 DIAGNOSIS — G609 Hereditary and idiopathic neuropathy, unspecified: Secondary | ICD-10-CM | POA: Diagnosis present

## 2013-05-08 DIAGNOSIS — G8929 Other chronic pain: Secondary | ICD-10-CM | POA: Diagnosis present

## 2013-05-08 DIAGNOSIS — M1990 Unspecified osteoarthritis, unspecified site: Secondary | ICD-10-CM | POA: Diagnosis present

## 2013-05-08 DIAGNOSIS — Z6833 Body mass index (BMI) 33.0-33.9, adult: Secondary | ICD-10-CM

## 2013-05-08 HISTORY — DX: Other ill-defined heart diseases: I51.89

## 2013-05-08 LAB — CBC
HCT: 35.6 % — ABNORMAL LOW (ref 36.0–46.0)
Hemoglobin: 11.9 g/dL — ABNORMAL LOW (ref 12.0–15.0)
MCV: 83.4 fL (ref 78.0–100.0)
RBC: 4.27 MIL/uL (ref 3.87–5.11)
RDW: 13 % (ref 11.5–15.5)
WBC: 4.9 10*3/uL (ref 4.0–10.5)

## 2013-05-08 LAB — POCT I-STAT, CHEM 8
Calcium, Ion: 1.22 mmol/L (ref 1.13–1.30)
Chloride: 104 mEq/L (ref 96–112)
Glucose, Bld: 106 mg/dL — ABNORMAL HIGH (ref 70–99)
HCT: 46 % (ref 36.0–46.0)
Hemoglobin: 15.6 g/dL — ABNORMAL HIGH (ref 12.0–15.0)
Potassium: 2.8 mEq/L — ABNORMAL LOW (ref 3.5–5.1)
Sodium: 144 mEq/L (ref 135–145)

## 2013-05-08 LAB — DIFFERENTIAL
Eosinophils Absolute: 0.1 10*3/uL (ref 0.0–0.7)
Eosinophils Relative: 2 % (ref 0–5)
Lymphocytes Relative: 32 % (ref 12–46)
Lymphs Abs: 1.6 10*3/uL (ref 0.7–4.0)
Monocytes Absolute: 0.3 10*3/uL (ref 0.1–1.0)
Monocytes Relative: 6 % (ref 3–12)
Neutro Abs: 3 10*3/uL (ref 1.7–7.7)

## 2013-05-08 LAB — BASIC METABOLIC PANEL
CO2: 28 mEq/L (ref 19–32)
Calcium: 9.5 mg/dL (ref 8.4–10.5)
Creatinine, Ser: 0.58 mg/dL (ref 0.50–1.10)
GFR calc Af Amer: >90 mL/min (ref >90)
GFR calc non Af Amer: >90 mL/min (ref >90)

## 2013-05-08 LAB — COMPREHENSIVE METABOLIC PANEL
ALT: 19 U/L (ref 0–35)
BUN: 8 mg/dL (ref 6–23)
CO2: 25 mEq/L (ref 19–32)
Calcium: 9.7 mg/dL (ref 8.4–10.5)
Chloride: 104 mEq/L (ref 96–112)
GFR calc Af Amer: >90 mL/min (ref >90)
GFR calc non Af Amer: >90 mL/min (ref >90)
Glucose, Bld: 106 mg/dL — ABNORMAL HIGH (ref 70–99)
Potassium: 3.2 mEq/L — ABNORMAL LOW (ref 3.5–5.1)
Sodium: 141 mEq/L (ref 135–145)
Total Bilirubin: 1 mg/dL (ref 0.3–1.2)
Total Protein: 7.1 g/dL (ref 6.0–8.3)

## 2013-05-08 LAB — TROPONIN I
Troponin I: <0.3 ng/mL (ref <0.30)
Troponin I: <0.3 ng/mL (ref <0.30)

## 2013-05-08 LAB — MAGNESIUM: Magnesium: 1.7 mg/dL (ref 1.5–2.5)

## 2013-05-08 LAB — APTT: aPTT: 32 seconds (ref 24–37)

## 2013-05-08 MED ORDER — POTASSIUM CHLORIDE CRYS ER 20 MEQ PO TBCR
20.0000 meq | EXTENDED_RELEASE_TABLET | Freq: Every day | ORAL | Status: DC
  Administered 2013-05-09 – 2013-05-10 (×2): 20 meq via ORAL
  Filled 2013-05-08 (×2): qty 1

## 2013-05-08 MED ORDER — POTASSIUM CHLORIDE CRYS ER 20 MEQ PO TBCR
40.0000 meq | EXTENDED_RELEASE_TABLET | ORAL | Status: AC
  Administered 2013-05-08 (×2): 40 meq via ORAL
  Filled 2013-05-08: qty 2

## 2013-05-08 MED ORDER — ENOXAPARIN SODIUM 40 MG/0.4ML ~~LOC~~ SOLN
40.0000 mg | SUBCUTANEOUS | Status: DC
  Administered 2013-05-08 – 2013-05-09 (×2): 40 mg via SUBCUTANEOUS
  Filled 2013-05-08 (×3): qty 0.4

## 2013-05-08 MED ORDER — POTASSIUM CHLORIDE 10 MEQ/100ML IV SOLN
10.0000 meq | INTRAVENOUS | Status: AC
  Administered 2013-05-08: 10 meq via INTRAVENOUS
  Filled 2013-05-08 (×2): qty 100

## 2013-05-08 MED ORDER — MELOXICAM 15 MG PO TABS
15.0000 mg | ORAL_TABLET | Freq: Every day | ORAL | Status: DC
  Administered 2013-05-08 – 2013-05-10 (×3): 15 mg via ORAL
  Filled 2013-05-08 (×3): qty 1

## 2013-05-08 MED ORDER — ASPIRIN EC 81 MG PO TBEC
81.0000 mg | DELAYED_RELEASE_TABLET | Freq: Every day | ORAL | Status: DC
  Administered 2013-05-08 – 2013-05-10 (×3): 81 mg via ORAL
  Filled 2013-05-08 (×4): qty 1

## 2013-05-08 MED ORDER — SODIUM CHLORIDE 0.9 % IV SOLN
Freq: Once | INTRAVENOUS | Status: AC
  Administered 2013-05-08: 17:00:00 via INTRAVENOUS

## 2013-05-08 MED ORDER — MAGNESIUM SULFATE 40 MG/ML IJ SOLN
2.0000 g | Freq: Once | INTRAMUSCULAR | Status: AC
  Administered 2013-05-08: 2 g via INTRAVENOUS
  Filled 2013-05-08: qty 50

## 2013-05-08 MED ORDER — TRAMADOL HCL 50 MG PO TABS: 50.0000 mg | ORAL_TABLET | Freq: Four times a day (QID) | ORAL | Status: DC | PRN

## 2013-05-08 NOTE — H&P (Signed)
Date: 05/08/2013               Patient Name:  [name] MRN: [id number]  DOB: 02-04-1941 Age / Sex: 72 y.o., female   PCP: Farley Ly, MD         Medical Service: Internal Medicine Teaching Service         Attending Physician: Dr. Kem Kays    First Contact: Dr. Aundria Rud Pager: [phone]  Second Contact: Dr. Shirlee Latch  Pager: [phone]       After Hours (After 5p/  First Contact Pager: [phone]  weekends / holidays): Second Contact Pager: [phone]   Chief Complaint: left hand numbness x 3 days  History of Present Illness:  Ms. Yankovich is a 72 year old woman with history of HTN, HL, borderline DM2 (A1C 6.4% in 11/2012), hypokalemia who presents who left arm numbness x 3 days.   Patient woke up on Sunday 11/16 morning, got dressed for church then noticed her left arm up into her left face in felt numb.  She went on to church where preacher prayed for her and numbness went away. She awoke on Monday 11/17 morning with numbness in her left upper lip and continued decreased sensation along the left cheek and ear.  Symptoms improved somewhat throughout the day but then returned that evening, along with left arm numbness.  She also dropped nail clippers she was holding in her left hand that she felt she should not have.  She denies history of stroke and does not take ASA.  No headache or confusion. CT head in ED negative for CVA.    Meds: No current facility-administered medications for this encounter.   Current Outpatient Prescriptions  Medication Sig Dispense Refill  . atenolol (TENORMIN) 50 MG tablet TAKE 1 TABLET (50 MG TOTAL) BY MOUTH DAILY.  31 tablet  11  . Cholecalciferol (VITAMIN D3) 1000 UNITS CAPS Take 3 capsules (3,000 Units total) by mouth daily.  90 capsule  6  . cloNIDine (CATAPRES) 0.1 MG tablet Take 1 tablet (0.1 mg total) by mouth daily.  30 tablet  9  . Cyanocobalamin (VITAMIN B-12) 500 MCG SUBL Place 1 tablet under the tongue daily.       . digoxin (LANOXIN) 0.125 MG  tablet Take 0.125 mg by mouth daily.      . furosemide (LASIX) 20 MG tablet Take 2 tablets (40 mg total) by mouth daily.  180 tablet  2  . meloxicam (MOBIC) 15 MG tablet Take 15 mg by mouth daily.      . potassium chloride SA (K-DUR,KLOR-CON) 20 MEQ tablet Take 20 mEq by mouth daily.      . pravastatin (PRAVACHOL) 40 MG tablet Take 1 tablet (40 mg total) by mouth daily.  90 tablet  2  . traMADol (ULTRAM) 50 MG tablet Take 1 tablet (50 mg total) by mouth every 6 (six) hours as needed.  120 tablet  0    Allergies: Allergies as of 05/08/2013 - Review Complete 05/08/2013  Allergen Reaction Noted  . Penicillins    . Sulfonamide derivatives     Past Medical History  Diagnosis Date  . Hypertension   . Hyperlipemia   . DJD (degenerative joint disease)   . Chronic low back pain   . Abnormal electromyogram (EMG)     Low grade right S1 radiculopathy 7/93.  [name] Hot flashes   . Rotator cuff syndrome of right shoulder     S/P right shoulder arthroscopic debridement of a massive  rotator cuff tear, greater tuberosity and resection of subacromial spur by Dr. Gean Birchwood 07/07/2005.  [name] Herniated lumbar intervertebral disc     S/P L4-5 & L5-S1 laminotomy, foraminotomy, and L5-S1 diskectomy by Dr. Trey Sailors 11/01/2000.  . Multiple joint pain   . Iron deficiency anemia   . Vitamin D deficiency   . SVT (supraventricular tachycardia)   . Bilateral leg edema   . Peripheral neuropathy   . Osteopenia   . Seasonal allergic rhinitis   . Elevated alkaline phosphatase level     Bone scan 08/2004 showed no evidence of abnormal bony activity..  . Skin tag     Right axillary area.  . Hypokalemia   . FAOZHYQM(578.4)    Past Surgical History  Procedure Laterality Date  . Total abdominal hysterectomy  1992  . Lumbar disc surgery  11/01/2000     L4-5, L5-S1 laminotomy, foraminotomy and L5-S1 diskectomy  . Shoulder arthroscopy  1/172007    S/P right shoulder arthroscopic debridement of a massive rotator cuff  tear, greater tuberoplasty and resection of subacromial spur by Dr. Gean Birchwood on 07/07/2005.  [name] Cholecystectomy, laparoscopic  1998   Family History  Problem Relation Age of Onset  . Ovarian cancer Mother 49  . Hypertension Mother   . Heart attack Mother 73  . Hypertension Sister   . Hypertension Sister   . Breast cancer Neg Hx   . Colon cancer Neg Hx   . Lung cancer Neg Hx   . Cancer Brother   . Neuropathy Sister    History   Social History  . Marital Status: Single    Spouse Name: N/A    Number of Children: N/A  . Years of Education: N/A   Occupational History  . Not on file.   Social History Main Topics  . Smoking status: Former Smoker -- 0.15 packs/day for 2 years    Quit date: 10/07/1999  . Smokeless tobacco: Never Used  . Alcohol Use: No  . Drug Use: No  . Sexual Activity: Not on file   Other Topics Concern  . Not on file   Social History Narrative  . No narrative on file  No smoking, EtOH or illicits.  Review of Systems: Review of Systems  Constitutional: Negative for fever and chills.  Eyes: Negative for blurred vision.  Respiratory: Negative for cough and shortness of breath.   Cardiovascular: Positive for leg swelling. Negative for chest pain and palpitations.  Gastrointestinal: Negative for nausea, vomiting, abdominal pain, diarrhea, constipation and blood in stool.  Genitourinary: Negative for dysuria.  Musculoskeletal: Negative for falls.  Neurological: Positive for tingling and focal weakness. Negative for dizziness, weakness and headaches.    Physical Exam: Blood pressure 136/65, pulse 77, temperature 98.8 F (37.1 C), temperature source Oral, resp. rate 18, height 5\' 9"  (1.753 m), weight 226 lb 8 oz (102.74 kg), SpO2 100.00%. General: alert, cooperative, and in no apparent distress HEENT: pupils equal round and reactive to light, vision grossly intact, oropharynx clear and non-erythematous  Neck: supple, no lymphadenopathy Lungs: clear to  ascultation bilaterally, normal work of respiration, no wheezes, rales, ronchi Heart: regular rate and rhythm, no murmurs, gallops, or rubs Abdomen: soft, non-tender, non-distended, normal bowel sounds Extremities: 1-2+ pitting edema bilaterally (patient states this is her baseline), 2+ DP/PT pulses bilaterally, no cyanosis, clubbing Neurologic: alert & oriented X3, cranial nerves II-XII intact, strength grossly intact, sensation intact to light touch   Lab results: Basic Metabolic Panel:  Recent Labs  69/62/95  1309  NA 144  K 2.8*  CL 104  GLUCOSE 106*  BUN 7  CREATININE 0.70   CBC:  Recent Labs  05/08/13 1220 05/08/13 1309  WBC 4.9  --   NEUTROABS 3.0  --   HGB 11.9* 15.6*  HCT 35.6* 46.0  MCV 83.4  --   PLT 160  --     Recent Labs  05/08/13 1220  LABPROT 13.0  INR 1.00    Imaging results:  Ct Head (brain) Wo Contrast  05/08/2013   CLINICAL DATA:  Dizziness with left upper extremity numbness  EXAM: CT HEAD WITHOUT CONTRAST  TECHNIQUE: Contiguous axial images were obtained from the base of the skull through the vertex without intravenous contrast. Study was obtained within 24 hr of patient's arrival at the emergency department.  COMPARISON:  Report of prior study Nov 10, 2000; images from that study not available.  FINDINGS: The ventricles are normal in size and configuration. There is no mass, hemorrhage, extra-axial fluid collection, or midline shift. There is patchy small vessel disease in the centra semiovale bilaterally. There is also small vessel disease in the anterior limbs of both internal and external capsules. No acute appearing infarct identified. Elsewhere gray-white compartments are normal.  Bony calvarium appears intact.  The mastoid air cells are clear.  IMPRESSION: Atrophy with small vessel disease. No demonstrable mass, hemorrhage, or acute appearing infarct.   Electronically Signed   By: Bretta Bang M.D.   On: 05/08/2013 13:12    Other  results: EKG: sinus rhythm, left axis deviation, mild PR prolongation, T wave inversion in III (unchanged from prior), no ST changes  Assessment & Plan by Problem: #Left arm numbness- Etiology stroke vs. TIA vs. weakness 2/2 hypokalemia.  CT head showed atrophy with small vessel disease, no mass, hemorrhage or acute infarct.  Patient has risk factors of HTN and HL, cannot exclude possibility of right brain stroke. Will initiate ASA therapy and stroke workup per neuro recs.  Will hold off on echo for now given that patient had echo in 08/2012 (EF 60-65%, grade II diastolic dysfunction, mild LVH).  -admit to IMTS -neurology consult, appreciate recs -neuro checks q2h  -MRI/MRA pending -bilateral carotid dopplers in AM -ASA 81 mg daily -PT/OT/SLP -trend troponins -BMP at 10p  -A1C, lipid panel in AM  #Hyopkalemia- K 2.8 on presentation. Supped with K-dur 40 meq x 2, KCl IV 10 meq x 2.  -repeat BMP at 10p -continue K-dur 20 meq daily  #Hypertension- holding antihypertensives for permissive hypertension  #DVT PPX- lovenox  #Code status- Full code  Dispo: Disposition is deferred at this time, awaiting improvement of current medical problems. Anticipated discharge in approximately 1-2 day(s).   The patient does have a current PCP Farley Ly, MD) and does need an [facility] hospital follow-up appointment after discharge.   Signed: Rocco Serene, MD 05/08/2013, 1:38 PM

## 2013-05-08 NOTE — ED Notes (Signed)
Pt reports onset Sunday of numbness sensation to left arm and hand, difficulty griping objects in left hand. Grip is slightly weaker left side, no other neuro deficits noted at triage.ekg done.

## 2013-05-08 NOTE — ED Notes (Signed)
States had an episode of numbness that started on Sunday--in left hand, arm, up left side of neck. States had a difficult time holding on to things. On Sunday- called minister "who prayed over me and numbness went away" per patient. Numbness started again last night -- called a different minister who "prayed over me and numbness is better"

## 2013-05-08 NOTE — Telephone Encounter (Signed)
OK 

## 2013-05-08 NOTE — ED Notes (Signed)
Patient called [name] in after redeveloping numbness to left arm and left face.  Teaching service aware and Notified Dr. Roseanne Reno

## 2013-05-08 NOTE — Telephone Encounter (Signed)
Pt calls and states she started having dizziness and numbness in her L hand, arm, shoulder, neck, lip and to L ear Sunday morning. This has continued and possibly is worse. She is ask to have someone bring her to ED asap or call 911, she states she will call her sister and daughter and come straight to ED

## 2013-05-08 NOTE — ED Provider Notes (Signed)
CSN: [id number]     Arrival date & time 05/08/13  1205 History   First MD Initiated Contact with Patient 05/08/13 1224     Chief Complaint  Patient presents with  . Numbness   (Consider location/radiation/quality/duration/timing/severity/associated sxs/prior Treatment) The history is provided by the patient.   patient presents with a 2 to three-day history of numbness in her left upper extremity. She states it is from her hand to her neck and left side of her face. She states she has dropped a couple things that she should not have dropped. No headache. No neck pain. No Confusion. No difficulty walking. No difficulty with vision.  Past Medical History  Diagnosis Date  . Hypertension   . Hyperlipemia   . DJD (degenerative joint disease)   . Chronic low back pain   . Abnormal electromyogram (EMG)     Low grade right S1 radiculopathy 7/93.  [name] Hot flashes   . Rotator cuff syndrome of right shoulder     S/P right shoulder arthroscopic debridement of a massive rotator cuff tear, greater tuberosity and resection of subacromial spur by Dr. Gean Birchwood 07/07/2005.  [name] Herniated lumbar intervertebral disc     S/P L4-5 & L5-S1 laminotomy, foraminotomy, and L5-S1 diskectomy by Dr. Trey Sailors 11/01/2000.  . Multiple joint pain   . Iron deficiency anemia   . Vitamin D deficiency   . SVT (supraventricular tachycardia)   . Bilateral leg edema   . Peripheral neuropathy   . Osteopenia   . Seasonal allergic rhinitis   . Elevated alkaline phosphatase level     Bone scan 08/2004 showed no evidence of abnormal bony activity..  . Skin tag     Right axillary area.  . Hypokalemia   . [id number](440.1)    Past Surgical History  Procedure Laterality Date  . Total abdominal hysterectomy  1992  . Lumbar disc surgery  11/01/2000     L4-5, L5-S1 laminotomy, foraminotomy and L5-S1 diskectomy  . Shoulder arthroscopy  1/172007    S/P right shoulder arthroscopic debridement of a massive rotator cuff tear, greater  tuberoplasty and resection of subacromial spur by Dr. Gean Birchwood on 07/07/2005.  [name] Cholecystectomy, laparoscopic  1998   Family History  Problem Relation Age of Onset  . Ovarian cancer Mother 31  . Hypertension Mother   . Heart attack Mother 51  . Hypertension Sister   . Hypertension Sister   . Breast cancer Neg Hx   . Colon cancer Neg Hx   . Lung cancer Neg Hx   . Cancer Brother   . Neuropathy Sister    History  Substance Use Topics  . Smoking status: Former Smoker -- 0.15 packs/day for 2 years    Quit date: 10/07/1999  . Smokeless tobacco: Never Used  . Alcohol Use: No   OB History   Grav Para Term Preterm Abortions TAB SAB Ect Mult Living                 Review of Systems  Constitutional: Negative for activity change and appetite change.  Eyes: Negative for pain.  Respiratory: Negative for chest tightness and shortness of breath.   Cardiovascular: Negative for chest pain and leg swelling.  Gastrointestinal: Negative for nausea, vomiting, abdominal pain and diarrhea.  Genitourinary: Negative for flank pain.  Musculoskeletal: Negative for back pain and neck stiffness.  Skin: Negative for rash.  Neurological: Positive for weakness and numbness. Negative for headaches.  Psychiatric/Behavioral: Negative for behavioral problems.    Allergies  Penicillins and Sulfonamide derivatives  Home Medications   Current Outpatient Rx  Name  Route  Sig  Dispense  Refill  . atenolol (TENORMIN) 50 MG tablet      TAKE 1 TABLET (50 MG TOTAL) BY MOUTH DAILY.   31 tablet   11   . Cholecalciferol (VITAMIN D3) 1000 UNITS CAPS   Oral   Take 3 capsules (3,000 Units total) by mouth daily.   90 capsule   6   . cloNIDine (CATAPRES) 0.1 MG tablet   Oral   Take 1 tablet (0.1 mg total) by mouth daily.   30 tablet   9   . Cyanocobalamin (VITAMIN B-12) 500 MCG SUBL   Sublingual   Place 1 tablet under the tongue daily.          . digoxin (LANOXIN) 0.125 MG tablet   Oral   Take  0.125 mg by mouth daily.         . furosemide (LASIX) 20 MG tablet   Oral   Take 2 tablets (40 mg total) by mouth daily.   180 tablet   2   . meloxicam (MOBIC) 15 MG tablet   Oral   Take 15 mg by mouth daily.         . potassium chloride SA (K-DUR,KLOR-CON) 20 MEQ tablet   Oral   Take 20 mEq by mouth daily.         . pravastatin (PRAVACHOL) 40 MG tablet   Oral   Take 1 tablet (40 mg total) by mouth daily.   90 tablet   2   . traMADol (ULTRAM) 50 MG tablet   Oral   Take 1 tablet (50 mg total) by mouth every 6 (six) hours as needed.   120 tablet   0    BP 143/71  Pulse 73  Temp(Src) 97.1 F (36.2 C) (Oral)  Resp 18  Ht 5\' 9"  (1.753 m)  Wt 226 lb 8 oz (102.74 kg)  BMI 33.43 kg/m2  SpO2 100% Physical Exam  Nursing note and vitals reviewed. Constitutional: She is oriented to person, place, and time. She appears well-developed and well-nourished.  HENT:  Head: Normocephalic and atraumatic.  Eyes: EOM are normal. Pupils are equal, round, and reactive to light.  Neck: Normal range of motion. Neck supple.  Cardiovascular: Normal rate, regular rhythm and normal heart sounds.   No murmur heard. Pulmonary/Chest: Effort normal and breath sounds normal. No respiratory distress. She has no wheezes. She has no rales.  Abdominal: Soft. Bowel sounds are normal. She exhibits no distension. There is no tenderness. There is no rebound and no guarding.  Musculoskeletal: Normal range of motion.  Neurological: She is alert and oriented to person, place, and time. No cranial nerve deficit.  Cranial nerves appear grossly intact. Strength is slightly decreased on left upper sternal he compared to right. Sensation is subjectively decreased. She does have gross sensation intact. Good straight leg raise bilaterally.  Skin: Skin is warm and dry.  Psychiatric: She has a normal mood and affect. Her speech is normal.    ED Course  Procedures (including critical care time) Labs  Review Labs Reviewed  CBC - Abnormal; Notable for the following:    Hemoglobin 11.9 (*)    HCT 35.6 (*)    All other components within normal limits  COMPREHENSIVE METABOLIC PANEL - Abnormal; Notable for the following:    Potassium 3.2 (*)    Glucose, Bld 106 (*)    Alkaline Phosphatase 151 (*)  All other components within normal limits  POCT I-STAT, CHEM 8 - Abnormal; Notable for the following:    Potassium 2.8 (*)    Glucose, Bld 106 (*)    Hemoglobin 15.6 (*)    All other components within normal limits  PROTIME-INR  APTT  DIFFERENTIAL  TROPONIN I  URINE RAPID DRUG SCREEN (HOSP PERFORMED)  MAGNESIUM   Imaging Review Ct Head (brain) Wo Contrast  05/08/2013   CLINICAL DATA:  Dizziness with left upper extremity numbness  EXAM: CT HEAD WITHOUT CONTRAST  TECHNIQUE: Contiguous axial images were obtained from the base of the skull through the vertex without intravenous contrast. Study was obtained within 24 hr of patient's arrival at the emergency department.  COMPARISON:  Report of prior study Nov 10, 2000; images from that study not available.  FINDINGS: The ventricles are normal in size and configuration. There is no mass, hemorrhage, extra-axial fluid collection, or midline shift. There is patchy small vessel disease in the centra semiovale bilaterally. There is also small vessel disease in the anterior limbs of both internal and external capsules. No acute appearing infarct identified. Elsewhere gray-white compartments are normal.  Bony calvarium appears intact.  The mastoid air cells are clear.  IMPRESSION: Atrophy with small vessel disease. No demonstrable mass, hemorrhage, or acute appearing infarct.   Electronically Signed   By: Bretta Bang M.D.   On: 05/08/2013 13:12    EKG Interpretation    Date/Time:  Tuesday May 08 2013 12:13:25 EST Ventricular Rate:  68 PR Interval:  206 QRS Duration: 86 QT Interval:  414 QTC Calculation: 440 R Axis:   -31 Text  Interpretation:  Normal sinus rhythm Left axis deviation Minimal voltage criteria for LVH, may be normal variant Abnormal ECG No significant change since last tracing Confirmed by Sorina Derrig  MD, Livie Vanderhoof ([id number]) on 05/08/2013 3:43:14 PM            MDM   1. Stroke   2. Hypokalemia    Patient with neurologic deficits. Has been going for a couple days so patient is not a TPA candidate. Will admit to internal medicine with neurology consult.    Juliet Rude. Rubin Payor, MD 05/08/13 1544

## 2013-05-08 NOTE — Consult Note (Signed)
Referring Physician: Rubin Payor     Chief Complaint: left arm and face decreased sensation  HPI:                                                                                                                                         [name] is an 72 y.o. female who woke up on Sunday morning noting her left arm felt "cold and left ear and side of face felt decreased in sensation."  As the day progressed her left face seemed to improve slightly and left hand was noted to have decreased sensation. In the evening on Sunday her symptoms improved but on Monday morning she noted her left upper lip was numb, with continued decreased sensation along the left cheek and ear.  Due to her left hand, left upper lip decreased sensation not improving she decided to come to ED.  She denies history of stroke and is on no antiplatelet therapy. Initial CT head was negative for CVA.   Date last known well: Date: 05/06/2013 Time last known well: Unable to determine tPA Given: No: out of window  Past Medical History  Diagnosis Date  . Hypertension   . Hyperlipemia   . DJD (degenerative joint disease)   . Chronic low back pain   . Abnormal electromyogram (EMG)     Low grade right S1 radiculopathy 7/93.  [name] Hot flashes   . Rotator cuff syndrome of right shoulder     S/P right shoulder arthroscopic debridement of a massive rotator cuff tear, greater tuberosity and resection of subacromial spur by Dr. Gean Birchwood 07/07/2005.  [name] Herniated lumbar intervertebral disc     S/P L4-5 & L5-S1 laminotomy, foraminotomy, and L5-S1 diskectomy by Dr. Trey Sailors 11/01/2000.  . Multiple joint pain   . Iron deficiency anemia   . Vitamin D deficiency   . SVT (supraventricular tachycardia)   . Bilateral leg edema   . Peripheral neuropathy   . Osteopenia   . Seasonal allergic rhinitis   . Elevated alkaline phosphatase level     Bone scan 08/2004 showed no evidence of abnormal bony activity..  . Skin tag     Right axillary  area.  . Hypokalemia   . ZOXWRUEA(540.9)     Past Surgical History  Procedure Laterality Date  . Total abdominal hysterectomy  1992  . Lumbar disc surgery  11/01/2000     L4-5, L5-S1 laminotomy, foraminotomy and L5-S1 diskectomy  . Shoulder arthroscopy  1/172007    S/P right shoulder arthroscopic debridement of a massive rotator cuff tear, greater tuberoplasty and resection of subacromial spur by Dr. Gean Birchwood on 07/07/2005.  [name] Cholecystectomy, laparoscopic  1998    Family History  Problem Relation Age of Onset  . Ovarian cancer Mother 3  . Hypertension Mother   . Heart attack Mother 70  . Hypertension Sister   . Hypertension Sister   .  Breast cancer Neg Hx   . Colon cancer Neg Hx   . Lung cancer Neg Hx   . Cancer Brother   . Neuropathy Sister    Social History:  reports that she quit smoking about 13 years ago. She has never used smokeless tobacco. She reports that she does not drink alcohol or use illicit drugs.  Allergies:  Allergies  Allergen Reactions  . Penicillins     REACTION: Local swelling in right arm after injection.  . Sulfonamide Derivatives     REACTION: Shortness of breath    Medications:                                                                                                                           No current facility-administered medications for this encounter.   Current Outpatient Prescriptions  Medication Sig Dispense Refill  . atenolol (TENORMIN) 50 MG tablet TAKE 1 TABLET (50 MG TOTAL) BY MOUTH DAILY.  31 tablet  11  . Cholecalciferol (VITAMIN D3) 1000 UNITS CAPS Take 3 capsules (3,000 Units total) by mouth daily.  90 capsule  6  . cloNIDine (CATAPRES) 0.1 MG tablet Take 1 tablet (0.1 mg total) by mouth daily.  30 tablet  9  . Cyanocobalamin (VITAMIN B-12) 500 MCG SUBL Place 1 tablet under the tongue daily.       . digoxin (LANOXIN) 0.125 MG tablet Take 0.125 mg by mouth daily.      . furosemide (LASIX) 20 MG tablet Take 2 tablets (40  mg total) by mouth daily.  180 tablet  2  . meloxicam (MOBIC) 15 MG tablet Take 15 mg by mouth daily.      . potassium chloride SA (K-DUR,KLOR-CON) 20 MEQ tablet Take 20 mEq by mouth daily.      . pravastatin (PRAVACHOL) 40 MG tablet Take 1 tablet (40 mg total) by mouth daily.  90 tablet  2  . traMADol (ULTRAM) 50 MG tablet Take 1 tablet (50 mg total) by mouth every 6 (six) hours as needed.  120 tablet  0     ROS:                                                                                                                                       History obtained from the patient  General ROS: negative for - chills, fatigue, fever, night sweats, weight  gain or weight loss Psychological ROS: negative for - behavioral disorder, hallucinations, memory difficulties, mood swings or suicidal ideation Ophthalmic ROS: negative for - blurry vision, double vision, eye pain or loss of vision ENT ROS: negative for - epistaxis, nasal discharge, oral lesions, sore throat, tinnitus or vertigo Allergy and Immunology ROS: negative for - hives or itchy/watery eyes Hematological and Lymphatic ROS: negative for - bleeding problems, bruising or swollen lymph nodes Endocrine ROS: negative for - galactorrhea, hair pattern changes, polydipsia/polyuria or temperature intolerance Respiratory ROS: negative for - cough, hemoptysis, shortness of breath or wheezing Cardiovascular ROS: negative for - chest pain, dyspnea on exertion, edema or irregular heartbeat Gastrointestinal ROS: negative for - abdominal pain, diarrhea, hematemesis, nausea/vomiting or stool incontinence Genito-Urinary ROS: negative for - dysuria, hematuria, incontinence or urinary frequency/urgency Musculoskeletal ROS: negative for - joint swelling or muscular weakness Neurological ROS: as noted in HPI Dermatological ROS: negative for rash and skin lesion changes  Neurologic Examination:                                                                                                       Blood pressure 153/67, pulse 66, temperature 97.1 F (36.2 C), temperature source Oral, resp. rate 15, height 5\' 9"  (1.753 m), weight 102.74 kg (226 lb 8 oz), SpO2 100.00%.   Mental Status: Alert, oriented, thought content appropriate.  Speech fluent without evidence of aphasia.  Able to follow 3 step commands without difficulty. Cranial Nerves: II: Discs flat bilaterally; Visual fields grossly normal, pupils equal, round, reactive to light and accommodation III,IV, VI: ptosis not present, extra-ocular motions intact bilaterally V,VII: smile symmetric, facial light touch sensation decreased over her left upper lip VIII: hearing normal bilaterally IX,X: gag reflex present XI: bilateral shoulder shrug XII: midline tongue extension without atrophy or fasciculations  Motor: Right : Upper extremity   4/5 (previous surgery) Left:     Upper extremity   5/5  Lower extremity   5/5     Lower extremity   5/5 Tone and bulk:normal tone throughout; no atrophy noted Sensory: Pinprick and light touch decreassed in left hand Deep Tendon Reflexes:  Right: Upper Extremity   Left: Upper extremity   biceps (C-5 to C-6) 2/4   biceps (C-5 to C-6) 2/4 tricep (C7) 2/4    triceps (C7) 2/4 Brachioradialis (C6) 2/4  Brachioradialis (C6) 2/4  Lower Extremity Lower Extremity  quadriceps (L-2 to L-4) 2/4   quadriceps (L-2 to L-4) 2/4 Achilles (S1) 1/4   Achilles (S1) 1/4  Plantars: Right: downgoing   Left: downgoing Cerebellar: normal finger-to-nose,  normal heel-to-shin test Gait: Not assessed.  CV: pulses palpable throughout    Results for orders placed during the hospital encounter of 05/08/13 (from the past 48 hour(s))  PROTIME-INR     Status: None   Collection Time    05/08/13 12:20 PM      Result Value Range   Prothrombin Time 13.0  11.6 - 15.2 seconds   INR 1.00  0.00 - 1.49  APTT     Status: None   Collection Time  05/08/13 12:20 PM      Result Value  Range   aPTT 32  24 - 37 seconds  CBC     Status: Abnormal   Collection Time    05/08/13 12:20 PM      Result Value Range   WBC 4.9  4.0 - 10.5 K/uL   RBC 4.27  3.87 - 5.11 MIL/uL   Hemoglobin 11.9 (*) 12.0 - 15.0 g/dL   HCT 14.7 (*) 82.9 - 56.2 %   MCV 83.4  78.0 - 100.0 fL   MCH 27.9  26.0 - 34.0 pg   MCHC 33.4  30.0 - 36.0 g/dL   RDW 13.0  86.5 - 78.4 %   Platelets 160  150 - 400 K/uL  DIFFERENTIAL     Status: None   Collection Time    05/08/13 12:20 PM      Result Value Range   Neutrophils Relative % 60  43 - 77 %   Neutro Abs 3.0  1.7 - 7.7 K/uL   Lymphocytes Relative 32  12 - 46 %   Lymphs Abs 1.6  0.7 - 4.0 K/uL   Monocytes Relative 6  3 - 12 %   Monocytes Absolute 0.3  0.1 - 1.0 K/uL   Eosinophils Relative 2  0 - 5 %   Eosinophils Absolute 0.1  0.0 - 0.7 K/uL   Basophils Relative 0  0 - 1 %   Basophils Absolute 0.0  0.0 - 0.1 K/uL  TROPONIN I     Status: None   Collection Time    05/08/13 12:20 PM      Result Value Range   Troponin I <0.30  <0.30 ng/mL   Comment:            Due to the release kinetics of cTnI,     a negative result within the first hours     of the onset of symptoms does not rule out     myocardial infarction with certainty.     If myocardial infarction is still suspected,     repeat the test at appropriate intervals.  POCT I-STAT, CHEM 8     Status: Abnormal   Collection Time    05/08/13  1:09 PM      Result Value Range   Sodium 144  135 - 145 mEq/L   Potassium 2.8 (*) 3.5 - 5.1 mEq/L   Chloride 104  96 - 112 mEq/L   BUN 7  6 - 23 mg/dL   Creatinine, Ser 6.96  0.50 - 1.10 mg/dL   Glucose, Bld 295 (*) 70 - 99 mg/dL   Calcium, Ion 2.84  1.32 - 1.30 mmol/L   TCO2 24  0 - 100 mmol/L   Hemoglobin 15.6 (*) 12.0 - 15.0 g/dL   HCT 44.0  10.2 - 72.5 %   Ct Head (brain) Wo Contrast  05/08/2013   CLINICAL DATA:  Dizziness with left upper extremity numbness  EXAM: CT HEAD WITHOUT CONTRAST  TECHNIQUE: Contiguous axial images were obtained from  the base of the skull through the vertex without intravenous contrast. Study was obtained within 24 hr of patient's arrival at the emergency department.  COMPARISON:  Report of prior study Nov 10, 2000; images from that study not available.  FINDINGS: The ventricles are normal in size and configuration. There is no mass, hemorrhage, extra-axial fluid collection, or midline shift. There is patchy small vessel disease in the centra semiovale bilaterally. There is also small vessel disease in  the anterior limbs of both internal and external capsules. No acute appearing infarct identified. Elsewhere gray-white compartments are normal.  Bony calvarium appears intact.  The mastoid air cells are clear.  IMPRESSION: Atrophy with small vessel disease. No demonstrable mass, hemorrhage, or acute appearing infarct.   Electronically Signed   By: Bretta Bang M.D.   On: 05/08/2013 13:12    Assessment and plan discussed with with attending physician and they are in agreement.    Felicie Morn PA-C [facility] [phone]  05/08/2013, 2:22 PM   Assessment: 72 y.o. female presenting with 2 day history of left hand and left facial decreased sensation. Sensation symptoms waxed and waned but never resolved since Sunday morning.  With patients risk factors of HTN and hyperlipidemia cannot exclude possibility of right brain stroke.   Stroke Risk Factors - hyperlipidemia and hypertension  Recommend: 1) Start ASA 81 mg daily 2) Stroke work up.   I personally participated in this patient's evaluation and management, including formulating about clinical impression and management recommendations.  Venetia Maxon M.D. [facility] [phone]

## 2013-05-08 NOTE — Progress Notes (Signed)
pts BP running 170s/90s, on call MD paged and made aware; MD ok with high BP, told to call if SBP sustaining over 180, will continue to monitor

## 2013-05-09 ENCOUNTER — Encounter (HOSPITAL_COMMUNITY): Payer: Self-pay | Admitting: General Practice

## 2013-05-09 DIAGNOSIS — I10 Essential (primary) hypertension: Secondary | ICD-10-CM

## 2013-05-09 DIAGNOSIS — R209 Unspecified disturbances of skin sensation: Secondary | ICD-10-CM

## 2013-05-09 DIAGNOSIS — G319 Degenerative disease of nervous system, unspecified: Secondary | ICD-10-CM

## 2013-05-09 DIAGNOSIS — E876 Hypokalemia: Secondary | ICD-10-CM

## 2013-05-09 DIAGNOSIS — I6789 Other cerebrovascular disease: Secondary | ICD-10-CM

## 2013-05-09 DIAGNOSIS — I6350 Cerebral infarction due to unspecified occlusion or stenosis of unspecified cerebral artery: Secondary | ICD-10-CM

## 2013-05-09 LAB — FOLATE: Folate: 18.4 ng/mL

## 2013-05-09 LAB — LIPID PANEL
Cholesterol: 205 mg/dL — ABNORMAL HIGH (ref 0–200)
VLDL: 14 mg/dL (ref 0–40)

## 2013-05-09 LAB — VITAMIN B12: Vitamin B-12: 311 pg/mL (ref 211–911)

## 2013-05-09 LAB — HEMOGLOBIN A1C: Mean Plasma Glucose: 105 mg/dL (ref <117)

## 2013-05-09 MED ORDER — SIMVASTATIN 20 MG PO TABS
20.0000 mg | ORAL_TABLET | Freq: Every day | ORAL | Status: DC
  Filled 2013-05-09: qty 1

## 2013-05-09 MED ORDER — INFLUENZA VAC SPLIT QUAD 0.5 ML IM SUSP
0.5000 mL | INTRAMUSCULAR | Status: AC
  Administered 2013-05-10: 0.5 mL via INTRAMUSCULAR
  Filled 2013-05-09: qty 0.5

## 2013-05-09 MED ORDER — ATORVASTATIN CALCIUM 40 MG PO TABS
40.0000 mg | ORAL_TABLET | Freq: Every day | ORAL | Status: DC
  Administered 2013-05-09: 40 mg via ORAL
  Filled 2013-05-09 (×2): qty 1

## 2013-05-09 NOTE — Care Management Note (Signed)
    Page 1 of 2   05/09/2013     3:47:27 PM   CARE MANAGEMENT NOTE 05/09/2013  Patient:  [name], [name]   Account Number:  [id number]  Date Initiated:  05/09/2013  Documentation initiated by:  GRAVES-BIGELOW,Kelee Cunningham  Subjective/Objective Assessment:   Pt admitted for left hand numbness x 3 days. Pt states she lives alone.     Action/Plan:   CM did speak to pt in reference to [facility] needs. Pt is agreeable to [facility] with [facility]. CM will make referral for services. SOC to begin within 24-48 hours post d/c.   Anticipated DC Date:  05/10/2013   Anticipated DC Plan:  HOME W HOME HEALTH SERVICES      DC Planning Services  CM consult      PAC Choice  DURABLE MEDICAL EQUIPMENT  HOME HEALTH   Choice offered to / List presented to:  C-1 Patient   DME arranged  Levan Hurst      DME agency  [facility]     HH arranged  HH-2 PT  HH-3 OT      [facility] agency  [facility]   Status of service:  Completed, signed off Medicare Important Message given?   (If response is "NO", the following Medicare IM given date fields will be blank) Date Medicare IM given:   Date Additional Medicare IM given:    Discharge Disposition:  HOME/SELF CARE  Per UR Regulation:  Reviewed for med. necessity/level of care/duration of stay  If discussed at Long Length of Stay Meetings, dates discussed:    Comments:

## 2013-05-09 NOTE — Evaluation (Signed)
Speech Language Pathology Evaluation Patient Details Name: [name] MRN: [id number] DOB: Aug 26, 1940 Today's Date: 05/09/2013 Time: 4098-1191 SLP Time Calculation (min): 16 min  Problem List:  Patient Active Problem List   Diagnosis Date Noted  . Diastolic dysfunction-grade 2 history 05/08/2013  . Numbness 05/08/2013  . Back pain 08/30/2012  . Hyperglycemia 06/01/2012  . Right leg pain 10/07/2010  . VITAMIN D DEFICIENCY 05/08/2010  . PERIPHERAL NEUROPATHY 10/28/2009  . OSTEOPENIA 04/03/2009  . ALLERGIC RHINITIS, SEASONAL 01/31/2009  . HYPOKALEMIA 04/24/2008  . LEG EDEMA, BILATERAL 02/22/2007  . HYPERTENSION 09/14/2006  . HYPERLIPIDEMIA 04/27/2006  . MENOPAUSE-RELATED VASOMOTOR SYMPTOMS, HOT FLASHES 04/27/2006  . DEGENERATIVE JOINT DISEASE 04/27/2006  . KNEE PAIN, RIGHT, CHRONIC 04/27/2006  . SUPRAVENTRICULAR TACHYCARDIA, HX OF 04/27/2006   Past Medical History:  Past Medical History  Diagnosis Date  . Hypertension   . Hyperlipemia   . DJD (degenerative joint disease)   . Chronic low back pain   . Abnormal electromyogram (EMG)     Low grade right S1 radiculopathy 7/93.  [name] Hot flashes   . Rotator cuff syndrome of right shoulder     S/P right shoulder arthroscopic debridement of a massive rotator cuff tear, greater tuberosity and resection of subacromial spur by Dr. Gean Birchwood 07/07/2005.  [name] Herniated lumbar intervertebral disc     S/P L4-5 & L5-S1 laminotomy, foraminotomy, and L5-S1 diskectomy by Dr. Trey Sailors 11/01/2000.  . Multiple joint pain   . Iron deficiency anemia   . Vitamin D deficiency   . SVT (supraventricular tachycardia)   . Bilateral leg edema   . Peripheral neuropathy   . Osteopenia   . Seasonal allergic rhinitis   . Elevated alkaline phosphatase level     Bone scan 08/2004 showed no evidence of abnormal bony activity..  . Skin tag     Right axillary area.  . Hypokalemia   . YNWGNFAO(130.8)    Past Surgical History:  Past Surgical History   Procedure Laterality Date  . Total abdominal hysterectomy  1992  . Lumbar disc surgery  11/01/2000     L4-5, L5-S1 laminotomy, foraminotomy and L5-S1 diskectomy  . Shoulder arthroscopy  1/172007    S/P right shoulder arthroscopic debridement of a massive rotator cuff tear, greater tuberoplasty and resection of subacromial spur by Dr. Gean Birchwood on 07/07/2005.  [name] Cholecystectomy, laparoscopic  [id number]   HPI:  72 year old woman with history of HTN, HL, borderline DM2 (A1C 6.4% in 11/2012), hypokalemia who presents who left arm numbness x 3 days. Patient woke up on Sunday 11/16 morning, got dressed for church then noticed her left arm up into her left face in felt numb. She went on to church where preacher prayed for her and numbness went away. She awoke on Monday 11/17 morning with numbness in her left upper lip and continued decreased sensation along the left cheek and ear.  MRI revealed no acute abnormalities with remote bilateral basal ganglia and thalamic lacunar infarcts.   Assessment / Plan / Recommendation Clinical Impression  Pt. demonstrated intelligible speech and functional language abilities.  Cognition for ADL's was WFL's.  Pt. states left labial/lingual/facial numbness persists.  SLP educated pt. on strategies to decrease biting cheek/lip during po's.  No further ST needed.    SLP Assessment  Patient does not need any further Speech Lanaguage Pathology Services    Follow Up Recommendations  None    Frequency and Duration        Pertinent Vitals/Pain n/a  SLP Evaluation Prior Functioning  Cognitive/Linguistic Baseline: Within functional limits  Lives With: Alone Available Help at Discharge: Family Vocation: Unemployed   Cognition  Overall Cognitive Status: Within Functional Limits for tasks assessed Orientation Level: Oriented X4    Comprehension  Auditory Comprehension Overall Auditory Comprehension: Appears within functional limits for tasks assessed Visual  Recognition/Discrimination Discrimination: Not tested Reading Comprehension Reading Status: Not tested    Expression Expression Primary Mode of Expression: Verbal Verbal Expression Overall Verbal Expression: Appears within functional limits for tasks assessed Written Expression Dominant Hand: Right   Oral / Motor Oral Motor/Sensory Function Overall Oral Motor/Sensory Function: Impaired Labial ROM: Within Functional Limits Labial Symmetry: Within Functional Limits Labial Strength: Within Functional Limits Labial Sensation: Reduced Lingual ROM: Within Functional Limits Lingual Symmetry: Within Functional Limits Lingual Strength: Within Functional Limits Facial ROM: Reduced left Facial Symmetry: Within Functional Limits Facial Strength: Within Functional Limits Facial Sensation: Reduced Velum: Within Functional Limits Mandible: Within Functional Limits Motor Speech Overall Motor Speech: Appears within functional limits for tasks assessed Intelligibility: Intelligible        Darrow Bussing.Ed [employer] [phone]  05/09/2013

## 2013-05-09 NOTE — Consult Note (Signed)
CARDIOLOGY CONSULT NOTE   Patient ID: [name] MRN: [id number], DOB/AGE: 01/01/1941   Admit date: 05/08/2013 Date of Consult: 05/09/2013   Primary Physician: Farley Ly, MD Primary Cardiologist: new - seen by Wylene Simmer, MD   Pt. Profile  Patient is a 72 year old female with a PMH of HTN, afib, BLE edema, HLD, chronic lower back pain and family Hx of HTN, MI, CA presented to the ED with LUE numbness which extended to left neck and ear.  Problem List  Past Medical History  Diagnosis Date  . Hypertension   . Hyperlipemia   . DJD (degenerative joint disease)   . Chronic low back pain   . Abnormal electromyogram (EMG)     Low grade right S1 radiculopathy 7/93.  [name] Hot flashes   . Rotator cuff syndrome of right shoulder     S/P right shoulder arthroscopic debridement of a massive rotator cuff tear, greater tuberosity and resection of subacromial spur by Dr. Gean Birchwood 07/07/2005.  [name] Herniated lumbar intervertebral disc     S/P L4-5 & L5-S1 laminotomy, foraminotomy, and L5-S1 diskectomy by Dr. Trey Sailors 11/01/2000.  . Multiple joint pain   . Iron deficiency anemia   . Vitamin D deficiency   . SVT (supraventricular tachycardia)     a. pt says she was told she had afib in the 90's and has been on digoxin since.  . Bilateral leg edema   . Peripheral neuropathy   . Osteopenia   . Seasonal allergic rhinitis   . Elevated alkaline phosphatase level     Bone scan 08/2004 showed no evidence of abnormal bony activity..  . Skin tag     Right axillary area.  . Hypokalemia   . Headache(784.0)   . Diastolic dysfunction     a. 08/2012 Echo: EF 60-65%, Gr 2 DD, triv MR, PASP .  [name] Left arm numbness     ? 04/2013  . Chest pain     a. reports nl cath in the 1990's.    Past Surgical History  Procedure Laterality Date  . Total abdominal hysterectomy  1992  . Lumbar disc surgery  11/01/2000     L4-5, L5-S1 laminotomy, foraminotomy and L5-S1 diskectomy  . Shoulder  arthroscopy  1/172007    S/P right shoulder arthroscopic debridement of a massive rotator cuff tear, greater tuberoplasty and resection of subacromial spur by Dr. Gean Birchwood on 07/07/2005.  [name] Cholecystectomy, laparoscopic  1998  . Cholecystectomy      Allergies  Allergies  Allergen Reactions  . Penicillins     REACTION: Local swelling in right arm after injection.  . Sulfonamide Derivatives     REACTION: Shortness of breath   HPI   72 year old female with the above problem list was diagnosed with HTN and afib a couple of years ago. According to patient she had a catheterization done in the 1990's which was unremarkable. She has had BLE edema that has come and gone for 1 to 2 years and has been advised in the past to wear support hose. 09/15/12 Echo showed mild LV hypertrophy, EF 60-65%, moderate diastolic dysfunction; mitral valve trivial regurgitation. Patient was last seen regarding BLE edema 12/10/12 with improved BLE edema. In the past month she noticed that the Mobic is increasing her swelling.  1 month ago she had a presyncopal event in which she felt unstable, but did not lose consciousness; 'the dizziness may have lasted for a few minutes'; there were no visual changes. On 05/06/13  she awoke with left arm wkns moving into her left shoulder and face.  This persisted while she got ready for church and then when she went to walk out to the car, she felt lightheaded and asked a friend to help her while she walked.  Lightheadedness was brief and did not recur the remainder of the day.  While at church, she was prayed for and numbness resolved.  Unfortunately, it recurred around 10:30 PM on Sunday and persisted all day Monday w/o recurrence of lightheadedness.  At no point did she experience chest pain or dyspnea.  She awoke early on Tuesday with more noticeable left arm numbness and later called her PCP's office.  She was advised to present to the ED for eval.  There, she was worked up as a stroke  however CT and MRI were negative.  She was admitted by IM and has continued to have left arm numbness w/o wkns.  ECG is non-acute and CE have been negative.  We've been asked to eval for possible anginal equivalent.  Inpatient Medications  . aspirin EC  81 mg Oral Daily  . atorvastatin  40 mg Oral q1800  . enoxaparin (LOVENOX) injection  40 mg Subcutaneous Q24H  . [START ON 05/10/2013] influenza vac split quadrivalent PF  0.5 mL Intramuscular Tomorrow-1000  . meloxicam  15 mg Oral Daily  . potassium chloride SA  20 mEq Oral Daily   Family History Family History  Problem Relation Age of Onset  . Ovarian cancer Mother 12  . Hypertension Mother   . Heart attack Mother 43  . Hypertension Sister   . Hypertension Sister   . Breast cancer Neg Hx   . Colon cancer Neg Hx   . Lung cancer Neg Hx   . Cancer Brother   . Neuropathy Sister     Social History History   Social History  . Marital Status: Single    Spouse Name: N/A    Number of Children: N/A  . Years of Education: N/A   Occupational History  . Not on file.   Social History Main Topics  . Smoking status: Former Smoker -- 0.15 packs/day for 2 years    Quit date: 10/07/1999  . Smokeless tobacco: Never Used  . Alcohol Use: No  . Drug Use: No  . Sexual Activity: Not on file   Other Topics Concern  . Not on file   Social History Narrative  . No narrative on file    Review of Systems  General:  No chills, fever, night sweats or weight changes.  Cardiovascular:  No chest pain, dyspnea on exertion, edema, orthopnea, palpitations, paroxysmal nocturnal dyspnea. Dermatological: No rash, lesions/masses Respiratory: No cough, dyspnea Urologic: No hematuria, dysuria Abdominal:   No nausea, vomiting, diarrhea, bright red blood per rectum, melena, or hematemesis Neurologic:  Left arm numbness extending into the shoulder and left face.  No visual changes, wkns, changes in mental status. All other systems reviewed and are  otherwise negative except as noted above.  Physical Exam  Blood pressure 172/79, pulse 72, temperature 99.3 F (37.4 C), temperature source Oral, resp. rate 20, height 5\' 9"  (1.753 m), weight 226 lb 8 oz (102.74 kg), SpO2 100.00%.  General: Pleasant, NAD Psych: Normal affect. Neuro: Alert and oriented X 3. Moves all extremities spontaneously. HEENT: Normal  Neck: Supple without bruits or JVD. Lungs:  Resp regular and unlabored, CTA. Heart: RRR no s3, s4, or murmurs. Abdomen: Soft, non-tender, non-distended, BS + x 4.  Extremities:  No clubbing, cyanosis.  Trace bilat LE edema. DP/PT/Radials 2+ and equal bilaterally.  Labs   Recent Labs  05/08/13 1220 05/08/13 2034 05/09/13 0108  TROPONINI <0.30 <0.30 <0.30   Lab Results  Component Value Date   WBC 4.9 05/08/2013   HGB 15.6* 05/08/2013   HCT 46.0 05/08/2013   MCV 83.4 05/08/2013   PLT 160 05/08/2013    Recent Labs Lab 05/08/13 1220  05/08/13 2034  NA 141  < > 141  K 3.2*  < > 4.0  CL 104  < > 107  CO2 25  --  28  BUN 8  < > 8  CREATININE 0.52  < > 0.58  CALCIUM 9.7  --  9.5  PROT 7.1  --   --   BILITOT 1.0  --   --   ALKPHOS 151*  --   --   ALT 19  --   --   AST 23  --   --   GLUCOSE 106*  < > 92  < > = values in this interval not displayed. Lab Results  Component Value Date   CHOL 205* 05/09/2013   HDL 59 05/09/2013   LDLCALC 132* 05/09/2013   TRIG 68 05/09/2013   Radiology/Studies  Dg Chest 2 View  05/08/2013   CLINICAL DATA:  Stroke, hypertension  EXAM: CHEST  2 VIEW  COMPARISON:  Chest radiograph 11/29/2008  FINDINGS: Normal cardiac silhouette. No effusion, infiltrate, or pneumothorax. Lungs are hyperinflated. Degenerative osteophytosis of the thoracic spine.  IMPRESSION: No active cardiopulmonary disease.  Hyperinflated lungs.   Electronically Signed   By: Genevive Bi M.D.   On: 05/08/2013 20:40   Ct Head (brain) Wo Contrast  05/08/2013   CLINICAL DATA:  Dizziness with left upper extremity  numbness  EXAM: CT HEAD WITHOUT CONTRAST    IMPRESSION: Atrophy with small vessel disease. No demonstrable mass, hemorrhage, or acute appearing infarct.   Electronically Signed   By: Bretta Bang M.D.   On: 05/08/2013 13:12   Mr Maxine Glenn Head/brain Wo Cm  05/08/2013   CLINICAL DATA:  Numbness and left face and cheek with intermittent left arm numbness.  EXAM: MRI HEAD WITHOUT CONTRAST  MRA HEAD WITHOUT CONTRAST   IMPRESSION: No acute ischemia.  Moderate to severe white matter changes suggest chronic small vessel ischemic disease with remote bilateral basal ganglia and thalamic lacunar infarcts. Suspected empty sella.  Normal MRA of the head, specifically no hemodynamically significant stenosis.   Electronically Signed   By: Awilda Metro   On: 05/08/2013 22:59   ECG  NSR   ASSESSMENT AND PLAN  1.  Left Arm Numbness:  Pt has a 3 day h/o persistent left arm numbness.  Neuro w/u has been negative to date.  We've been asked to eval r/t possible anginal equivalent.  She has not had any chest pain or dyspnea.  She reports that in spite of numbness over the past few days, she has not experienced any inability to perform routine activities.  Finally, despite prolonged numbness, her ecg is normal and troponin is normal.  Doubt ischemia.  Would not pursue any further cardiac w/u at this time.  2.  ? PAF:  She says that she has been on digoxin since the 90's for what she believes is PAF.  She is in sinus rhythm.  No recent palpitations.  CHADS2 = 1 (CHA2DS2VASc = 3).  W/o clear evidence that she ever had a h/o PAF, would not anticoagulate.  3.  HTN:  BP's variable.  Will likely require treatment.  Defer to IM.  Signed, Nicolasa Ducking, NP 05/09/2013, 4:37 PM Patient examined. EKG and chest xray reviewed. Her exam is unremarkable.  She continues to complain of residual left arm numbness. Agree with above assessment and plan.

## 2013-05-09 NOTE — Progress Notes (Signed)
Stroke Team Progress Note  HISTORY [name] is an 72 y.o. female who woke up on Sunday morning 05/06/2013 noting her left arm felt "cold and left ear and side of face felt decreased in sensation." As the day progressed her left face seemed to improve slightly and left hand was noted to have decreased sensation. In the evening on Sunday her symptoms improved but on Monday morning 05/07/2013 she noted her left upper lip was numb, with continued decreased sensation along the left cheek and ear. Due to her left hand, left upper lip decreased sensation not improving she decided to come to ED. She denies history of stroke and is on no antiplatelet therapy. Initial CT head was negative for CVA. Patient was not a TPA candidate secondary to delay in arrival. She was admitted for further evaluation and treatment.  SUBJECTIVE No Family is at the bedside.  Overall she feels her condition is stable.   OBJECTIVE Most recent Vital Signs: Filed Vitals:   05/09/13 0000 05/09/13 0200 05/09/13 0400 05/09/13 0600  BP: 126/89 140/70 153/81 154/74  Pulse: 93 88 100 95  Temp: 98.5 F (36.9 C)  98.6 F (37 C) 98.5 F (36.9 C)  TempSrc: Oral  Oral Oral  Resp: 18 18 18 18   Height:      Weight:      SpO2: 99% 98% 99% 99%   CBG (last 3)  No results found for this basename: GLUCAP,  in the last 72 hours  IV Fluid Intake:     MEDICATIONS  . aspirin EC  81 mg Oral Daily  . enoxaparin (LOVENOX) injection  40 mg Subcutaneous Q24H  . meloxicam  15 mg Oral Daily  . potassium chloride SA  20 mEq Oral Daily   PRN:  traMADol  Diet:  Cardiac thin liquids Activity:  As tolerated DVT Prophylaxis:  Lovenox 40 mg sq daily   CLINICALLY SIGNIFICANT STUDIES Basic Metabolic Panel:  Recent Labs Lab 05/08/13 1220 05/08/13 1309 05/08/13 2034  NA 141 144 141  K 3.2* 2.8* 4.0  CL 104 104 107  CO2 25  --  28  GLUCOSE 106* 106* 92  BUN 8 7 8   CREATININE 0.52 0.70 0.58  CALCIUM 9.7  --  9.5  MG 1.7  --   --     Liver Function Tests:  Recent Labs Lab 05/08/13 1220  AST 23  ALT 19  ALKPHOS 151*  BILITOT 1.0  PROT 7.1  ALBUMIN 3.9   CBC:  Recent Labs Lab 05/08/13 1220 05/08/13 1309  WBC 4.9  --   NEUTROABS 3.0  --   HGB 11.9* 15.6*  HCT 35.6* 46.0  MCV 83.4  --   PLT 160  --    Coagulation:  Recent Labs Lab 05/08/13 1220  LABPROT 13.0  INR 1.00   Cardiac Enzymes:  Recent Labs Lab 05/08/13 1220 05/08/13 2034 05/09/13 0108  TROPONINI <0.30 <0.30 <0.30   Urinalysis: No results found for this basename: COLORURINE, APPERANCEUR, LABSPEC, PHURINE, GLUCOSEU, HGBUR, BILIRUBINUR, KETONESUR, PROTEINUR, UROBILINOGEN, NITRITE, LEUKOCYTESUR,  in the last 168 hours Lipid Panel    Component Value Date/Time   CHOL 205* 05/09/2013 0108   TRIG 68 05/09/2013 0108   HDL 59 05/09/2013 0108   CHOLHDL 3.5 05/09/2013 0108   VLDL 14 05/09/2013 0108   LDLCALC 132* 05/09/2013 0108   HgbA1C  Lab Results  Component Value Date   HGBA1C 5.4 12/13/2012    Urine Drug Screen:   No results found for  this basename: labopia, cocainscrnur, labbenz, amphetmu, thcu, labbarb    Alcohol Level: No results found for this basename: ETH,  in the last 168 hours   CT of the brain  05/08/2013    Atrophy with small vessel disease. No demonstrable mass, hemorrhage, or acute appearing infarct.   MRI of the brain  05/08/2013    No acute ischemia.  Moderate to severe white matter changes suggest chronic small vessel ischemic disease with remote bilateral basal ganglia and thalamic lacunar infarcts. Suspected empty sella.   MRA of the brain  05/08/2013   Normal MRA of the head, specifically no hemodynamically significant stenosis.     2D Echocardiogram    Carotid Doppler    CXR  05/08/2013    No active cardiopulmonary disease.  Hyperinflated lungs.  EKG  Normal sinus rhythm. Left axis deviation. Minimal voltage criteria for LVH, may be normal variant.   Therapy Recommendations home health PT  Physical  Exam   Pleasant elderly lady not in distress.Awake alert. Afebrile. Head is nontraumatic. Neck is supple without bruit. Hearing is normal. Cardiac exam no murmur or gallop. Lungs are clear to auscultation. Distal pulses are well felt. Neurological Exam ;  Awake  Alert oriented x 3. Normal speech and language.eye movements full without nystagmus.fundi were not visualized. Vision acuity and fields appear normal. Hearing is normal. Palatal movements are normal. Face symmetric. Tongue midline. Normal strength, tone, reflexes and coordination. Diminished left face and upper extremity sensation. Gait deferred. ASSESSMENT [name] is a 72 y.o. female presenting with dizziness and numbness left hand, arm, shoulder, neck, lip and ear. Dx:  a right brainstem infarct, too small to be seen on MRI versus TIA. Infarct felt to be thrombotic secondary to small vessel disease.  On no antithrombotics prior to admission. Now on aspirin 81 mg orally every day for secondary stroke prevention. Patient with resultant mild dizziness and left sided hemisensory deficit upper extremity and body. Work up underway.  Hypertension  Hyperlipidemia, LDL 132, on pravachol PTA, now on no statin, goal LDL < 100 (< 70 for diabetics) Obesity, Body mass index is 33.43 kg/(m^2). Snores, feels tired during the day ? obstructive sleep apnea   Hospital day # 1  TREATMENT/PLAN RECOMMENDATIONS  Continue aspirin 81 mg orally every day for secondary stroke prevention.  OP eval for obstructive sleep apnea  Resume statin for goal LDL < 100; consider addition of zetia.   Therapy evals  F/u 2D and carotid dopplers, HgbA1c  Ongoing risk factor control  SHARON BIBY, MSN, RN, ANVP-BC, ANP-BC, GNP-BC [facility] Pager: [phone] 05/09/2013 9:07 AM  I have personally obtained a history, examined the patient, evaluated imaging results, and formulated the assessment and plan of care. I agree with the  above. Delia Heady, MD

## 2013-05-09 NOTE — Evaluation (Signed)
Physical Therapy Evaluation Patient Details Name: [name] MRN: [id number] DOB: 01/26/1941 Today's Date: 05/09/2013 Time: 6962-9528 PT Time Calculation (min): 24 min  PT Assessment / Plan / Recommendation History of Present Illness  72 year old woman with history of HTN, HL, borderline DM2 (A1C 6.4% in 11/2012), hypokalemia who presents who left arm numbness x 3 days. Patient woke up on Sunday 11/16 morning, got dressed for church then noticed her left arm up into her left face in felt numb. She went on to church where preacher prayed for her and numbness went away. She awoke on Monday 11/17 morning with numbness in her left upper lip and continued decreased sensation along the left cheek and ear.  MRI revealed no acute abnormalities with remote bilateral basal ganglia and thalamic lacunar infarcts.    Clinical Impression  Pt presents with some balance deficits and LE weakness. I would recommend using a RW to improve independence. Pt lives alone and may be able to get some help from family. I feel with frequent ambulation on the unit with the staff pt will reach mod. Independent level with the RW for D/C home and follow-up HHPT services. Pt would benefit from continued inpatient services to improved independence with mobility.    PT Assessment  Patient needs continued PT services    Follow Up Recommendations  Home health PT    Does the patient have the potential to tolerate intense rehabilitation      Barriers to Discharge Decreased caregiver support      Equipment Recommendations  Rolling walker with 5" wheels    Recommendations for Other Services     Frequency Min 3X/week    Precautions / Restrictions Precautions Precautions: Fall Restrictions Weight Bearing Restrictions: No   Pertinent Vitals/Pain       Mobility  Bed Mobility Bed Mobility: Supine to Sit Supine to Sit: 7: Independent Transfers Transfers: Sit to Stand;Stand to Sit Sit to Stand: From bed;5:  Supervision;With upper extremity assist (S with RW, Min assist without RW) Stand to Sit: 5: Supervision;To bed Details for Transfer Assistance: Pt is unsteady on initial rise up requiring therapist to steady her. Pt stood using RW with Supervision. Ambulation/Gait Ambulation/Gait Assistance: 5: Supervision;4: Min assist Ambulation Distance (Feet): 120 Feet Assistive device: Rolling walker;1 person hand held assist (S-with RW, Min assist with HHA) Ambulation/Gait Assistance Details: pt demonstrated unsteadiness without RW Gait Pattern: Step-through pattern;Decreased stride length;Decreased weight shift to right;Trunk flexed Gait velocity: decreased General Gait Details: Pt's right LE would intermittently cross to midline causing a narrow BOS, with verbal cues pt could correct and balance was improved Stairs: No    Exercises     PT Diagnosis: Difficulty walking  PT Problem List: Decreased activity tolerance;Decreased balance;Decreased mobility PT Treatment Interventions: DME instruction;Gait training;Therapeutic activities;Therapeutic exercise;Balance training;Patient/family education     PT Goals(Current goals can be found in the care plan section) Acute Rehab PT Goals Patient Stated Goal: to return home PT Goal Formulation: With patient Time For Goal Achievement: 05/23/13 Potential to Achieve Goals: Good  Visit Information  Last PT Received On: 05/09/13 Assistance Needed: +1 History of Present Illness: 72 year old woman with history of HTN, HL, borderline DM2 (A1C 6.4% in 11/2012), hypokalemia who presents who left arm numbness x 3 days. Patient woke up on Sunday 11/16 morning, got dressed for church then noticed her left arm up into her left face in felt numb. She went on to church where preacher prayed for her and numbness went away.  She awoke on Monday 11/17 morning with numbness in her left upper lip and continued decreased sensation along the left cheek and ear.  MRI revealed no  acute abnormalities with remote bilateral basal ganglia and thalamic lacunar infarcts.         Prior Functioning  Home Living Family/patient expects to be discharged to:: Private residence Living Arrangements: Alone Available Help at Discharge: Family Type of Home: Apartment Home Access: Level entry Home Layout: One level Home Equipment: None  Lives With: Alone Prior Function Level of Independence: Independent Communication Communication: No difficulties Dominant Hand: Right    Cognition  Cognition Arousal/Alertness: Awake/alert Behavior During Therapy: WFL for tasks assessed/performed Overall Cognitive Status: Within Functional Limits for tasks assessed    Extremity/Trunk Assessment Upper Extremity Assessment Upper Extremity Assessment: Defer to OT evaluation Lower Extremity Assessment Lower Extremity Assessment: RLE deficits/detail;LLE deficits/detail RLE Deficits / Details: hip flexion 4/5 LLE Deficits / Details: hip flexion 4/5 LLE Sensation:  (pt reports this side is decreased) Cervical / Trunk Assessment Cervical / Trunk Assessment: Normal   Balance Balance Balance Assessed: Yes Static Standing Balance Static Standing - Balance Support: No upper extremity supported Static Standing - Level of Assistance: 5: Stand by assistance  End of Session PT - End of Session Equipment Utilized During Treatment: Gait belt Activity Tolerance: Patient tolerated treatment well Patient left: in bed;with call bell/phone within reach Nurse Communication: Mobility status (walk with RW as much as possible with staff)  GP     Greggory Stallion 05/09/2013, 9:35 AM

## 2013-05-09 NOTE — H&P (Signed)
INTERNAL MEDICINE TEACHING SERVICE Attending Admission Note  Date: 05/09/2013  Patient name: [name]  Medical record number: [id number]  Date of birth: Mar 31, 1941    I have seen and evaluated [name] and discussed their care with the Residency Team.  72 yr old female with hx HTN, HL, persented with left arm numbness and left facial numbness. The patient states the symptoms began on 11/16 during morning time. The symptoms resolved but then reoccurred Monday morning and intermittently recurred throughout the day. She therefore came to the ED. She denies any CP with exertion, but states at times gets "winded". She states she thinks she has a history of Afib (intermittent?) and this is why she is on digoxin. She has seen cardiology in the past, but does not recall who (here in [place]). CT brain without acte infarct, but evidence of small vessel disease. MRI/MRA brain with no acute ischemia but evidence of remote thalamic/basal ganglia infarcts. At this time, agree with ASA and high dose statin. She has had a TTE this year, would ask neurology if a repeat is needed or if TEE is needed. Would consult cardiology for further recommendations.  Jonah Blue, DO, FACP Faculty [facility] Internal Medicine Residency Program 05/09/2013, 1:01 PM

## 2013-05-09 NOTE — Progress Notes (Signed)
Subjective: Ms. Coello is doing well this morning, states that left arm and face numbness is improved but still present; intermittent, not associated with activity.  Otherwise sleeping and eating well.   Objective: Vital signs in last 24 hours: Filed Vitals:   05/09/13 0000 05/09/13 0200 05/09/13 0400 05/09/13 0600  BP: 126/89 140/70 153/81 154/74  Pulse: 93 88 100 95  Temp: 98.5 F (36.9 C)  98.6 F (37 C) 98.5 F (36.9 C)  TempSrc: Oral  Oral Oral  Resp: 18 18 18 18   Height:      Weight:      SpO2: 99% 98% 99% 99%   Weight change:   Intake/Output Summary (Last 24 hours) at 05/09/13 0839 Last data filed at 05/08/13 2200  Gross per 24 hour  Intake    240 ml  Output      0 ml  Net    240 ml   PEX General: alert, cooperative, and in no apparent distress HEENT: NCAT Neck: supple, no lymphadenopathy Lungs: clear to ascultation bilaterally, normal work of respiration, no wheezes, rales, ronchi Heart: regular rate and rhythm, no murmurs, gallops, or rubs Abdomen: soft, non-tender, non-distended, normal bowel sounds Extremities: 2+ DP/PT pulses bilaterally, no cyanosis, clubbing, or edema Neurologic: alert & oriented X3, cranial nerves II-XII intact, strength 5/5 throughout, sensation intact to light touch  Lab Results: Basic Metabolic Panel:  Recent Labs Lab 05/08/13 1220 05/08/13 1309 05/08/13 2034  NA 141 144 141  K 3.2* 2.8* 4.0  CL 104 104 107  CO2 25  --  28  GLUCOSE 106* 106* 92  BUN 8 7 8   CREATININE 0.52 0.70 0.58  CALCIUM 9.7  --  9.5  MG 1.7  --   --    Liver Function Tests:  Recent Labs Lab 05/08/13 1220  AST 23  ALT 19  ALKPHOS 151*  BILITOT 1.0  PROT 7.1  ALBUMIN 3.9   CBC:  Recent Labs Lab 05/08/13 1220 05/08/13 1309  WBC 4.9  --   NEUTROABS 3.0  --   HGB 11.9* 15.6*  HCT 35.6* 46.0  MCV 83.4  --   PLT 160  --    Cardiac Enzymes:  Recent Labs Lab 05/08/13 1220 05/08/13 2034 05/09/13 0108  TROPONINI <0.30 <0.30 <0.30     Fasting Lipid Panel:  Recent Labs Lab 05/09/13 0108  CHOL 205*  HDL 59  LDLCALC 132*  TRIG 68  CHOLHDL 3.5   Coagulation:  Recent Labs Lab 05/08/13 1220  LABPROT 13.0  INR 1.00   Studies/Results: Dg Chest 2 View  05/08/2013   CLINICAL DATA:  Stroke, hypertension  EXAM: CHEST  2 VIEW  COMPARISON:  Chest radiograph 11/29/2008  FINDINGS: Normal cardiac silhouette. No effusion, infiltrate, or pneumothorax. Lungs are hyperinflated. Degenerative osteophytosis of the thoracic spine.  IMPRESSION: No active cardiopulmonary disease.  Hyperinflated lungs.   Electronically Signed   By: Genevive Bi M.D.   On: 05/08/2013 20:40   Ct Head (brain) Wo Contrast  05/08/2013   CLINICAL DATA:  Dizziness with left upper extremity numbness  EXAM: CT HEAD WITHOUT CONTRAST  TECHNIQUE: Contiguous axial images were obtained from the base of the skull through the vertex without intravenous contrast. Study was obtained within 24 hr of patient's arrival at the emergency department.  COMPARISON:  Report of prior study Nov 10, 2000; images from that study not available.  FINDINGS: The ventricles are normal in size and configuration. There is no mass, hemorrhage, extra-axial fluid collection, or  midline shift. There is patchy small vessel disease in the centra semiovale bilaterally. There is also small vessel disease in the anterior limbs of both internal and external capsules. No acute appearing infarct identified. Elsewhere gray-white compartments are normal.  Bony calvarium appears intact.  The mastoid air cells are clear.  IMPRESSION: Atrophy with small vessel disease. No demonstrable mass, hemorrhage, or acute appearing infarct.   Electronically Signed   By: Bretta Bang M.D.   On: 05/08/2013 13:12   Mr Brain Wo Contrast  05/08/2013   CLINICAL DATA:  Numbness and left face and cheek with intermittent left arm numbness.  EXAM: MRI HEAD WITHOUT CONTRAST  MRA HEAD WITHOUT CONTRAST  TECHNIQUE:  Multiplanar, multiecho pulse sequences of the brain and surrounding structures were obtained without intravenous contrast. Angiographic images of the head were obtained using MRA technique without contrast.  COMPARISON:  CT of head May 08, 2013 at 1303 hr.  FINDINGS: MRI HEAD FINDINGS  No reduced diffusion to suggest acute ischemia. A few punctate foci of T2 shine through within the left posterior frontal lobe, precentral gyrus. No susceptibility artifact to suggest hemorrhage. Tiny flow void and left thalamus and left globe pallidus.  The ventricles and sulci are normal for patient's age. Patchy to confluent supratentorial, pontine white matter T2 hyperintensities. Additional bilateral subcentimeter basal ganglia T2 hyperintensities, patchy T2 bright signal within the bilateral thalami . No midline shift, mass effect nor mass lesions.  No abnormal extra-axial fluid collections.  Patient is edentulous. Paranasal sinuses and mastoid air cells appear well-aerated. Ocular globes and orbital contents are nonsuspicious though not tailored for evaluation. No suspicious calvarial bone marrow signal. Mildly expanded predominant fluid filled apparent empty sella. Craniocervical junction maintained.  MRA HEAD FINDINGS  Anterior circulation: Normal flow related enhancement of the included cervical, petrous, cavernous and supra clinoid internal carotid arteries. Normal appearance of the anterior and middle cerebral arteries, including more distal segments with patent anterior communicating artery.  Posterior circulation: Codominant vertebral arteries, with mildly tortuous basilar artery, which shows gentle tapering, with compensatory request contribution from bilateral posterior communicating arteries (normal variant), and normal flow with normal flow related enhancement of the bilateral posterior cerebral arteries.  No large vessel occlusion, hemodynamically significant stenosis, suspicious luminal irregularity or  dissection.  IMPRESSION: No acute ischemia.  Moderate to severe white matter changes suggest chronic small vessel ischemic disease with remote bilateral basal ganglia and thalamic lacunar infarcts. Suspected empty sella.  Normal MRA of the head, specifically no hemodynamically significant stenosis.   Electronically Signed   By: Awilda Metro   On: 05/08/2013 22:59   Mr Maxine Glenn Head/brain Wo Cm  05/08/2013   CLINICAL DATA:  Numbness and left face and cheek with intermittent left arm numbness.  EXAM: MRI HEAD WITHOUT CONTRAST  MRA HEAD WITHOUT CONTRAST  TECHNIQUE: Multiplanar, multiecho pulse sequences of the brain and surrounding structures were obtained without intravenous contrast. Angiographic images of the head were obtained using MRA technique without contrast.  COMPARISON:  CT of head May 08, 2013 at 1303 hr.  FINDINGS: MRI HEAD FINDINGS  No reduced diffusion to suggest acute ischemia. A few punctate foci of T2 shine through within the left posterior frontal lobe, precentral gyrus. No susceptibility artifact to suggest hemorrhage. Tiny flow void and left thalamus and left globe pallidus.  The ventricles and sulci are normal for patient's age. Patchy to confluent supratentorial, pontine white matter T2 hyperintensities. Additional bilateral subcentimeter basal ganglia T2 hyperintensities, patchy T2 bright signal within the bilateral  thalami . No midline shift, mass effect nor mass lesions.  No abnormal extra-axial fluid collections.  Patient is edentulous. Paranasal sinuses and mastoid air cells appear well-aerated. Ocular globes and orbital contents are nonsuspicious though not tailored for evaluation. No suspicious calvarial bone marrow signal. Mildly expanded predominant fluid filled apparent empty sella. Craniocervical junction maintained.  MRA HEAD FINDINGS  Anterior circulation: Normal flow related enhancement of the included cervical, petrous, cavernous and supra clinoid internal carotid  arteries. Normal appearance of the anterior and middle cerebral arteries, including more distal segments with patent anterior communicating artery.  Posterior circulation: Codominant vertebral arteries, with mildly tortuous basilar artery, which shows gentle tapering, with compensatory request contribution from bilateral posterior communicating arteries (normal variant), and normal flow with normal flow related enhancement of the bilateral posterior cerebral arteries.  No large vessel occlusion, hemodynamically significant stenosis, suspicious luminal irregularity or dissection.  IMPRESSION: No acute ischemia.  Moderate to severe white matter changes suggest chronic small vessel ischemic disease with remote bilateral basal ganglia and thalamic lacunar infarcts. Suspected empty sella.  Normal MRA of the head, specifically no hemodynamically significant stenosis.   Electronically Signed   By: Awilda Metro   On: 05/08/2013 22:59   Medications: I have reviewed the patient's current medications. Scheduled Meds: . aspirin EC  81 mg Oral Daily  . enoxaparin (LOVENOX) injection  40 mg Subcutaneous Q24H  . meloxicam  15 mg Oral Daily  . potassium chloride SA  20 mEq Oral Daily   PRN Meds:.traMADol Assessment/Plan: #Left arm numbness- Unclear etiology- ?TIA vs. ?cardiac. CT head showed atrophy with small vessel disease, no mass, hemorrhage or acute infarct.  MRI/MRA showed no acute ischemia; moderate to severe white matter changes suggest chronic small vessel disease with remote bilateral basal ganglia and thalamic lacunar infarcts; normal MRA of head without significant stenosis.  Bilateral carotid dopplers negative.  Echo in 08/2012 (EF 60-65%, grade II diastolic dysfunction, mild LVH). Stroke risk stratification: A1C 5.3%, LDL 132 (already on statin).  Troponins x 3 negative. TIMI score 2 (age, CAD risk fx).  Unclear why patient takes digoxin (called [facility], prescribed by PCP Joines, pt does fill every month),  patient thinks she may take due to history of atrial fibrillation.  -neurology consult, appreciate recs  -cardiology consult due to concern that left arm and face numbness may be anginal equivalent; echo choice pending cards eval  -neuro checks q2h  -continue ASA 81 mg daily, resume statin -PT/OT/SLP- home health PT/OT, no SLP follow-up recommended -digoxin level pending (has been sub-therapeutic in past)  #Hyopkalemia- K 2.8 on presentation. Supped with K-dur 40 meq x 2, KCl IV 10 meq x 2. K 4.0 on PM labs.  -continue K-dur 20 meq daily  -BMP in AM  #Hypertension- stable, holding antihypertensives for permissive hypertension  -restart home BP meds at discharge  #DVT PPX- lovenox  #Code status- Full code  Dispo:  Anticipated discharge tomorrow.   The patient does have a current PCP Farley Ly, MD) and does need an [facility] hospital follow-up appointment after discharge.   .Services Needed at time of discharge: Y = Yes, Blank = No PT:   OT:   RN:   Equipment:   Other:     LOS: 1 day   Rocco Serene, MD 05/09/2013, 8:39 AM

## 2013-05-09 NOTE — Progress Notes (Signed)
Pts. Blood pressure 174/100. Notified on call MD. Will continue to monitor.

## 2013-05-09 NOTE — Evaluation (Signed)
Occupational Therapy Evaluation Patient Details Name: [name] MRN: [id number] DOB: May 07, 1941 Today's Date: 05/09/2013 Time: 4098-1191 OT Time Calculation (min): 37 min  OT Assessment / Plan / Recommendation History of present illness 72 year old woman with history of HTN, HL, borderline DM2 (A1C 6.4% in 11/2012), hypokalemia who presents who left arm numbness x 3 days. Patient woke up on Sunday 11/16 morning, got dressed for church then noticed her left arm up into her left face in felt numb. She went on to church where preacher prayed for her and numbness went away. She awoke on Monday 11/17 morning with numbness in her left upper lip and continued decreased sensation along the left cheek and ear.  MRI revealed no acute abnormalities with remote bilateral basal ganglia and thalamic lacunar infarcts.     Clinical Impression   Pt demos decline in function and safety with ADLs and ADL mobility with decreased strength and coordination in L UE. Pt would benefit from acute OT services to address impairments to help restore PLOF to return home safely    OT Assessment  Patient needs continued OT Services    Follow Up Recommendations  Home health OT    Barriers to Discharge   none  Equipment Recommendations  None recommended by OT    Recommendations for Other Services    Frequency  Min 2X/week    Precautions / Restrictions Precautions Precautions: Fall Restrictions Weight Bearing Restrictions: No   Pertinent Vitals/Pain No c/o pain    ADL  Grooming: Performed;Wash/dry hands;Wash/dry face;Teeth care;Supervision/safety;Set up;Min guard Where Assessed - Grooming: Unsupported standing Upper Body Bathing: Simulated;Supervision/safety Lower Body Bathing: Simulated;Min guard Where Assessed - Lower Body Bathing: Unsupported sit to stand Upper Body Dressing: Performed;Supervision/safety Lower Body Dressing: Performed;Min guard Toilet Transfer: Performed;Supervision/safety;Min  guard Acupuncturist: Regular height toilet;Grab bars Toileting - Clothing Manipulation and Hygiene: Performed;Min guard Where Assessed - Engineer, mining and Hygiene: Standing Tub/Shower Transfer: Estate agent: Grab bars;Walk in shower Equipment Used: Gait belt;Rolling walker Transfers/Ambulation Related to ADLs: cues for correct hand placement, sup-min guard A for safety    OT Diagnosis: Generalized weakness  OT Problem List: Decreased strength;Decreased coordination;Decreased knowledge of use of DME or AE;Impaired UE functional use OT Treatment Interventions: Self-care/ADL training;Therapeutic exercise;Patient/family education;Neuromuscular education;Balance training;Therapeutic activities;DME and/or AE instruction   OT Goals(Current goals can be found in the care plan section) Acute Rehab OT Goals Patient Stated Goal: to return home OT Goal Formulation: With patient Time For Goal Achievement: 05/16/13 Potential to Achieve Goals: Good ADL Goals Pt Will Perform Grooming: with set-up;with supervision;standing Pt Will Perform Lower Body Bathing: with set-up;with supervision;sit to/from stand Pt Will Perform Lower Body Dressing: with set-up;with supervision;sit to/from stand Pt Will Transfer to Toilet: with supervision;with modified independence;ambulating;regular height toilet Pt Will Perform Tub/Shower Transfer: with modified independence;with supervision Additional ADL Goal #1: Pt will be 100% competent in HEP for L UE grip strengthening and FMC activities to decrease difficulty and increase independence with ADLs and functional tasks  Visit Information  Last OT Received On: 05/09/13 Assistance Needed: +1 History of Present Illness: 72 year old woman with history of HTN, HL, borderline DM2 (A1C 6.4% in 11/2012), hypokalemia who presents who left arm numbness x 3 days. Patient woke up on Sunday 11/16 morning, got dressed for church  then noticed her left arm up into her left face in felt numb. She went on to church where preacher prayed for her and numbness went away. She awoke on Monday  11/17 morning with numbness in her left upper lip and continued decreased sensation along the left cheek and ear.  MRI revealed no acute abnormalities with remote bilateral basal ganglia and thalamic lacunar infarcts.         Prior Functioning     Home Living Family/patient expects to be discharged to:: Private residence Living Arrangements: Alone Available Help at Discharge: Family Type of Home: Apartment Home Access: Level entry Home Layout: One level Home Equipment: None  Lives With: Alone Prior Function Level of Independence: Independent Comments: pt no longer drives Communication Communication: No difficulties Dominant Hand: Right         Vision/Perception Vision - History Baseline Vision: Wears glasses only for reading Patient Visual Report: No change from baseline Perception Perception: Within Functional Limits   Cognition  Cognition Arousal/Alertness: Awake/alert Behavior During Therapy: WFL for tasks assessed/performed Overall Cognitive Status: Within Functional Limits for tasks assessed    Extremity/Trunk Assessment Upper Extremity Assessment Upper Extremity Assessment: Overall WFL for tasks assessed;Generalized weakness;LUE deficits/detail LUE Deficits / Details: diffucluty with grasping/gripping small itmes, cups, ADL items. Pt drops items and requires increased time to complete tasks LUE Coordination: decreased fine motor;decreased gross motor Lower Extremity Assessment Lower Extremity Assessment: Defer to PT evaluation Cervical / Trunk Assessment Cervical / Trunk Assessment: Normal     Mobility Bed Mobility Bed Mobility: Supine to Sit;Sitting - Scoot to [place] of Bed;Sit to Supine;Scooting to [facility] Supine to Sit: 7: Independent Sitting - Scoot to Edge of Bed: 7: Independent Sit to Supine: 7:  Independent Scooting to [facility]: 7: Independent Transfers Sit to Stand: From bed;5: Supervision;With upper extremity assist;From toilet Stand to Sit: 5: Supervision;To bed;To toilet Details for Transfer Assistance: cues for correct hand placement using RW     Exercise Other Exercises Other Exercises: Pt participated in in [facility] acitivities seated at EOB at tabletop for grasping/gripping condiment packets, small rubber band, utensils, straws, etc.....   Balance Balance Balance Assessed: Yes Dynamic Sitting Balance Dynamic Sitting - Balance Support: No upper extremity supported;Feet unsupported;During functional activity Dynamic Sitting - Level of Assistance: 6: Modified independent (Device/Increase time) Static Standing Balance Static Standing - Balance Support: No upper extremity supported;During functional activity Static Standing - Level of Assistance: 5: Stand by assistance Dynamic Standing Balance Dynamic Standing - Balance Support: No upper extremity supported;During functional activity Dynamic Standing - Level of Assistance: 5: Stand by assistance;Other (comment) (min guard A)   End of Session OT - End of Session Equipment Utilized During Treatment: Gait belt;Rolling walker Activity Tolerance: Patient tolerated treatment well Patient left: in bed;with call bell/phone within reach  GO     Galen Manila 05/09/2013, 12:42 PM

## 2013-05-09 NOTE — Progress Notes (Signed)
Utilization review completed.  

## 2013-05-09 NOTE — Progress Notes (Signed)
*  PRELIMINARY RESULTS* Vascular Ultrasound Carotid Duplex (Doppler) has been completed.  Preliminary findings: Bilateral:  1-39% ICA stenosis.  Vertebral artery flow is antegrade.      Farrel Demark, RDMS, RVT  05/09/2013, 11:21 AM

## 2013-05-10 DIAGNOSIS — E785 Hyperlipidemia, unspecified: Secondary | ICD-10-CM

## 2013-05-10 DIAGNOSIS — R209 Unspecified disturbances of skin sensation: Secondary | ICD-10-CM

## 2013-05-10 LAB — BASIC METABOLIC PANEL
BUN: 10 mg/dL (ref 6–23)
CO2: 25 mEq/L (ref 19–32)
Calcium: 9.5 mg/dL (ref 8.4–10.5)
Creatinine, Ser: 0.5 mg/dL (ref 0.50–1.10)
GFR calc Af Amer: >90 mL/min (ref >90)
GFR calc non Af Amer: >90 mL/min (ref >90)
Glucose, Bld: 96 mg/dL (ref 70–99)
Potassium: 4.1 mEq/L (ref 3.5–5.1)

## 2013-05-10 MED ORDER — ATENOLOL 50 MG PO TABS
50.0000 mg | ORAL_TABLET | Freq: Every day | ORAL | Status: DC
  Administered 2013-05-10: 50 mg via ORAL
  Filled 2013-05-10: qty 1

## 2013-05-10 MED ORDER — FUROSEMIDE 40 MG PO TABS
40.0000 mg | ORAL_TABLET | Freq: Every day | ORAL | Status: DC
  Administered 2013-05-10: 40 mg via ORAL
  Filled 2013-05-10: qty 1

## 2013-05-10 MED ORDER — ATORVASTATIN CALCIUM 40 MG PO TABS: 40.0000 mg | ORAL_TABLET | Freq: Every day | ORAL | Status: DC

## 2013-05-10 MED ORDER — CLONIDINE HCL 0.1 MG PO TABS
0.1000 mg | ORAL_TABLET | Freq: Every day | ORAL | Status: DC
  Administered 2013-05-10: 0.1 mg via ORAL
  Filled 2013-05-10: qty 1

## 2013-05-10 MED ORDER — ASPIRIN 81 MG PO TBEC: 81.0000 mg | DELAYED_RELEASE_TABLET | Freq: Every day | ORAL | Status: DC

## 2013-05-10 NOTE — Discharge Summary (Signed)
  Date: 05/10/2013  Patient name: [name]  Medical record number: [id number]  Date of birth: 06/02/1941   This patient has been discussed with the house staff. Please see their note for complete details. I concur with their findings and plan.  Jonah Blue, DO, FACP Faculty [facility] Internal Medicine Residency Program 05/10/2013, 7:31 PM

## 2013-05-10 NOTE — Care Management (Signed)
DME 3n1 to be delivered to room before d/c. Gala Lewandowsky, RN,BSN [phone]

## 2013-05-10 NOTE — Progress Notes (Signed)
Subjective: [name] is doing well this morning, states that left arm and face numbness is improved but still present, mainly in her hand.    Objective: Vital signs in last 24 hours: Filed Vitals:   05/09/13 1600 05/09/13 2036 05/09/13 2357 05/10/13 0015  BP: 172/79 174/100 186/104 172/100  Pulse: 72 79 74   Temp: 99.3 F (37.4 C) 98.4 F (36.9 C) 98.1 F (36.7 C)   TempSrc: Oral Oral Oral   Resp: 20 20 18    Height:      Weight:      SpO2: 100% 98% 97%    Weight change:   Intake/Output Summary (Last 24 hours) at 05/10/13 1610 Last data filed at 05/09/13 1700  Gross per 24 hour  Intake    360 ml  Output      0 ml  Net    360 ml   PEX General: alert, cooperative, and in no apparent distress HEENT: NCAT Neck: supple, no lymphadenopathy Lungs: clear to ascultation bilaterally, normal work of respiration, no wheezes, rales, ronchi Heart: regular rate and rhythm, no murmurs, gallops, or rubs Abdomen: soft, non-tender, non-distended, normal bowel sounds Extremities: 2+ DP/PT pulses bilaterally, no cyanosis, clubbing, or edema Neurologic: alert & oriented X3, cranial nerves II-XII intact, strength 5/5 throughout, sensation intact to light touch  Lab Results: Basic Metabolic Panel:  Recent Labs Lab 05/08/13 1220  05/08/13 2034 05/10/13 0507  NA 141  < > 141 140  K 3.2*  < > 4.0 4.1  CL 104  < > 107 104  CO2 25  --  28 25  GLUCOSE 106*  < > 92 96  BUN 8  < > 8 10  CREATININE 0.52  < > 0.58 0.50  CALCIUM 9.7  --  9.5 9.5  MG 1.7  --   --   --   < > = values in this interval not displayed. Liver Function Tests:  Recent Labs Lab 05/08/13 1220  AST 23  ALT 19  ALKPHOS 151*  BILITOT 1.0  PROT 7.1  ALBUMIN 3.9   CBC:  Recent Labs Lab 05/08/13 1220 05/08/13 1309  WBC 4.9  --   NEUTROABS 3.0  --   HGB 11.9* 15.6*  HCT 35.6* 46.0  MCV 83.4  --   PLT 160  --    Cardiac Enzymes:  Recent Labs Lab 05/08/13 1220 05/08/13 2034 05/09/13 0108    TROPONINI <0.30 <0.30 <0.30   Fasting Lipid Panel:  Recent Labs Lab 05/09/13 0108  CHOL 205*  HDL 59  LDLCALC 132*  TRIG 68  CHOLHDL 3.5   Coagulation:  Recent Labs Lab 05/08/13 1220  LABPROT 13.0  INR 1.00   Studies/Results: Dg Chest 2 View  05/08/2013   CLINICAL DATA:  Stroke, hypertension  EXAM: CHEST  2 VIEW  COMPARISON:  Chest radiograph 11/29/2008  FINDINGS: Normal cardiac silhouette. No effusion, infiltrate, or pneumothorax. Lungs are hyperinflated. Degenerative osteophytosis of the thoracic spine.  IMPRESSION: No active cardiopulmonary disease.  Hyperinflated lungs.   Electronically Signed   By: Genevive Bi M.D.   On: 05/08/2013 20:40   Ct Head (brain) Wo Contrast  05/08/2013   CLINICAL DATA:  Dizziness with left upper extremity numbness  EXAM: CT HEAD WITHOUT CONTRAST  TECHNIQUE: Contiguous axial images were obtained from the base of the skull through the vertex without intravenous contrast. Study was obtained within 24 hr of patient's arrival at the emergency department.  COMPARISON:  Report of prior study Nov 10, 2000; images from that study not available.  FINDINGS: The ventricles are normal in size and configuration. There is no mass, hemorrhage, extra-axial fluid collection, or midline shift. There is patchy small vessel disease in the centra semiovale bilaterally. There is also small vessel disease in the anterior limbs of both internal and external capsules. No acute appearing infarct identified. Elsewhere gray-white compartments are normal.  Bony calvarium appears intact.  The mastoid air cells are clear.  IMPRESSION: Atrophy with small vessel disease. No demonstrable mass, hemorrhage, or acute appearing infarct.   Electronically Signed   By: Bretta Bang M.D.   On: 05/08/2013 13:12   Mr Brain Wo Contrast  05/08/2013   CLINICAL DATA:  Numbness and left face and cheek with intermittent left arm numbness.  EXAM: MRI HEAD WITHOUT CONTRAST  MRA HEAD WITHOUT  CONTRAST  TECHNIQUE: Multiplanar, multiecho pulse sequences of the brain and surrounding structures were obtained without intravenous contrast. Angiographic images of the head were obtained using MRA technique without contrast.  COMPARISON:  CT of head May 08, 2013 at 1303 hr.  FINDINGS: MRI HEAD FINDINGS  No reduced diffusion to suggest acute ischemia. A few punctate foci of T2 shine through within the left posterior frontal lobe, precentral gyrus. No susceptibility artifact to suggest hemorrhage. Tiny flow void and left thalamus and left globe pallidus.  The ventricles and sulci are normal for patient's age. Patchy to confluent supratentorial, pontine white matter T2 hyperintensities. Additional bilateral subcentimeter basal ganglia T2 hyperintensities, patchy T2 bright signal within the bilateral thalami . No midline shift, mass effect nor mass lesions.  No abnormal extra-axial fluid collections.  Patient is edentulous. Paranasal sinuses and mastoid air cells appear well-aerated. Ocular globes and orbital contents are nonsuspicious though not tailored for evaluation. No suspicious calvarial bone marrow signal. Mildly expanded predominant fluid filled apparent empty sella. Craniocervical junction maintained.  MRA HEAD FINDINGS  Anterior circulation: Normal flow related enhancement of the included cervical, petrous, cavernous and supra clinoid internal carotid arteries. Normal appearance of the anterior and middle cerebral arteries, including more distal segments with patent anterior communicating artery.  Posterior circulation: Codominant vertebral arteries, with mildly tortuous basilar artery, which shows gentle tapering, with compensatory request contribution from bilateral posterior communicating arteries (normal variant), and normal flow with normal flow related enhancement of the bilateral posterior cerebral arteries.  No large vessel occlusion, hemodynamically significant stenosis, suspicious luminal  irregularity or dissection.  IMPRESSION: No acute ischemia.  Moderate to severe white matter changes suggest chronic small vessel ischemic disease with remote bilateral basal ganglia and thalamic lacunar infarcts. Suspected empty sella.  Normal MRA of the head, specifically no hemodynamically significant stenosis.   Electronically Signed   By: Awilda Metro   On: 05/08/2013 22:59   Mr Maxine Glenn Head/brain Wo Cm  05/08/2013   CLINICAL DATA:  Numbness and left face and cheek with intermittent left arm numbness.  EXAM: MRI HEAD WITHOUT CONTRAST  MRA HEAD WITHOUT CONTRAST  TECHNIQUE: Multiplanar, multiecho pulse sequences of the brain and surrounding structures were obtained without intravenous contrast. Angiographic images of the head were obtained using MRA technique without contrast.  COMPARISON:  CT of head May 08, 2013 at 1303 hr.  FINDINGS: MRI HEAD FINDINGS  No reduced diffusion to suggest acute ischemia. A few punctate foci of T2 shine through within the left posterior frontal lobe, precentral gyrus. No susceptibility artifact to suggest hemorrhage. Tiny flow void and left thalamus and left globe pallidus.  The ventricles and sulci are normal  for patient's age. Patchy to confluent supratentorial, pontine white matter T2 hyperintensities. Additional bilateral subcentimeter basal ganglia T2 hyperintensities, patchy T2 bright signal within the bilateral thalami . No midline shift, mass effect nor mass lesions.  No abnormal extra-axial fluid collections.  Patient is edentulous. Paranasal sinuses and mastoid air cells appear well-aerated. Ocular globes and orbital contents are nonsuspicious though not tailored for evaluation. No suspicious calvarial bone marrow signal. Mildly expanded predominant fluid filled apparent empty sella. Craniocervical junction maintained.  MRA HEAD FINDINGS  Anterior circulation: Normal flow related enhancement of the included cervical, petrous, cavernous and supra clinoid internal  carotid arteries. Normal appearance of the anterior and middle cerebral arteries, including more distal segments with patent anterior communicating artery.  Posterior circulation: Codominant vertebral arteries, with mildly tortuous basilar artery, which shows gentle tapering, with compensatory request contribution from bilateral posterior communicating arteries (normal variant), and normal flow with normal flow related enhancement of the bilateral posterior cerebral arteries.  No large vessel occlusion, hemodynamically significant stenosis, suspicious luminal irregularity or dissection.  IMPRESSION: No acute ischemia.  Moderate to severe white matter changes suggest chronic small vessel ischemic disease with remote bilateral basal ganglia and thalamic lacunar infarcts. Suspected empty sella.  Normal MRA of the head, specifically no hemodynamically significant stenosis.   Electronically Signed   By: Awilda Metro   On: 05/08/2013 22:59   Medications: I have reviewed the patient's current medications. Scheduled Meds: . aspirin EC  81 mg Oral Daily  . atenolol  50 mg Oral Daily  . atorvastatin  40 mg Oral q1800  . cloNIDine  0.1 mg Oral Daily  . enoxaparin (LOVENOX) injection  40 mg Subcutaneous Q24H  . furosemide  40 mg Oral Daily  . influenza vac split quadrivalent PF  0.5 mL Intramuscular Tomorrow-1000  . meloxicam  15 mg Oral Daily  . potassium chloride SA  20 mEq Oral Daily   PRN Meds:.traMADol Assessment/Plan: #Left arm numbness- Unclear etiology- small right brainstem infarct not seen on MRI vs. TIA per neuro. CT head showed atrophy with small vessel disease, no mass, hemorrhage or acute infarct.  MRI/MRA showed no acute ischemia; moderate to severe white matter changes suggest chronic small vessel disease with remote bilateral basal ganglia and thalamic lacunar infarcts; normal MRA of head without significant stenosis.  Bilateral carotid dopplers negative.  Echo in 08/2012 (EF 60-65%, grade  II diastolic dysfunction, mild LVH), ok not to repeat per neuro recs. Stroke risk stratification: A1C 5.3%, LDL 132 (already on statin but will change to high potency Lipitor 40 mg daily).  Troponins x 3 negative. TIMI score 1-2 (age, ?CAD risk fx).  Unclear why patient takes digoxin though likely due to history of paroxysmal atrial fibrillation (digoxin level sub-therapeutic at 0.6).  No further cardioogy workup per cards recs.   -neurology consult, appreciate recs  -cardiology consult, appreciate recs -continue ASA 81 mg daily, statin (changed to Lipitor 40 mg daily) -PT/OT/SLP- home health PT/OT (arranged), no SLP follow-up recommended  #Hyopkalemia- K 2.8 on presentation. Supped with K-dur 40 meq x 2, KCl IV 10 meq x 2. K 4.1 this morning.   -continue home med K-dur 20 meq daily at discharge   #Hypertension- was holding antihypertensives for permissive hypertension, BP 170s systolic as result though stable on home regimen per clinic notes.  -restart home BP meds at discharge  #DVT PPX- lovenox  #Code status- Full code  Dispo:  Anticipated discharge today.   The patient does have a current PCP Dorene Sorrow  Lamount Cranker, MD) and does need an [facility] hospital follow-up appointment after discharge.   .Services Needed at time of discharge: Y = Yes, Blank = No PT:   OT:   RN:   Equipment:   Other:     LOS: 2 days   Rocco Serene, MD 05/10/2013, 8:12 AM

## 2013-05-10 NOTE — Progress Notes (Signed)
Stroke Team Progress Note  HISTORY [name] is an 72 y.o. female who woke up on Sunday morning 05/06/2013 noting her left arm felt "cold and left ear and side of face felt decreased in sensation." As the day progressed her left face seemed to improve slightly and left hand was noted to have decreased sensation. In the evening on Sunday her symptoms improved but on Monday morning 05/07/2013 she noted her left upper lip was numb, with continued decreased sensation along the left cheek and ear. Due to her left hand, left upper lip decreased sensation not improving she decided to come to ED. She denies history of stroke and is on no antiplatelet therapy. Initial CT head was negative for CVA. Patient was not a TPA candidate secondary to delay in arrival. She was admitted for further evaluation and treatment.  SUBJECTIVE No family at bedside. She is sitting up in a chair at the bedside. Pt reports she is feeling better and her numbness is resolving.  OBJECTIVE Most recent Vital Signs: Filed Vitals:   05/09/13 1600 05/09/13 2036 05/09/13 2357 05/10/13 0015  BP: 172/79 174/100 186/104 172/100  Pulse: 72 79 74   Temp: 99.3 F (37.4 C) 98.4 F (36.9 C) 98.1 F (36.7 C)   TempSrc: Oral Oral Oral   Resp: 20 20 18    Height:      Weight:      SpO2: 100% 98% 97%    CBG (last 3)  No results found for this basename: GLUCAP,  in the last 72 hours  IV Fluid Intake:     MEDICATIONS  . aspirin EC  81 mg Oral Daily  . atenolol  50 mg Oral Daily  . atorvastatin  40 mg Oral q1800  . cloNIDine  0.1 mg Oral Daily  . enoxaparin (LOVENOX) injection  40 mg Subcutaneous Q24H  . furosemide  40 mg Oral Daily  . influenza vac split quadrivalent PF  0.5 mL Intramuscular Tomorrow-1000  . meloxicam  15 mg Oral Daily  . potassium chloride SA  20 mEq Oral Daily   PRN:  traMADol  Diet:  Cardiac thin liquids Activity:  As tolerated DVT Prophylaxis:  Lovenox 40 mg sq daily   CLINICALLY SIGNIFICANT  STUDIES Basic Metabolic Panel:  Recent Labs Lab 05/08/13 1220  05/08/13 2034 05/10/13 0507  NA 141  < > 141 140  K 3.2*  < > 4.0 4.1  CL 104  < > 107 104  CO2 25  --  28 25  GLUCOSE 106*  < > 92 96  BUN 8  < > 8 10  CREATININE 0.52  < > 0.58 0.50  CALCIUM 9.7  --  9.5 9.5  MG 1.7  --   --   --   < > = values in this interval not displayed. Liver Function Tests:   Recent Labs Lab 05/08/13 1220  AST 23  ALT 19  ALKPHOS 151*  BILITOT 1.0  PROT 7.1  ALBUMIN 3.9   CBC:   Recent Labs Lab 05/08/13 1220 05/08/13 1309  WBC 4.9  --   NEUTROABS 3.0  --   HGB 11.9* 15.6*  HCT 35.6* 46.0  MCV 83.4  --   PLT 160  --    Coagulation:   Recent Labs Lab 05/08/13 1220  LABPROT 13.0  INR 1.00   Cardiac Enzymes:   Recent Labs Lab 05/08/13 1220 05/08/13 2034 05/09/13 0108  TROPONINI <0.30 <0.30 <0.30   Urinalysis: No results found for this  basename: COLORURINE, APPERANCEUR, LABSPEC, PHURINE, GLUCOSEU, HGBUR, BILIRUBINUR, KETONESUR, PROTEINUR, UROBILINOGEN, NITRITE, LEUKOCYTESUR,  in the last 168 hours Lipid Panel    Component Value Date/Time   CHOL 205* 05/09/2013 0108   TRIG 68 05/09/2013 0108   HDL 59 05/09/2013 0108   CHOLHDL 3.5 05/09/2013 0108   VLDL 14 05/09/2013 0108   LDLCALC 132* 05/09/2013 0108   HgbA1C  Lab Results  Component Value Date   HGBA1C 5.3 05/09/2013    Urine Drug Screen:   No results found for this basename: labopia,  cocainscrnur,  labbenz,  amphetmu,  thcu,  labbarb    Alcohol Level: No results found for this basename: ETH,  in the last 168 hours   CT of the brain  05/08/2013    Atrophy with small vessel disease. No demonstrable mass, hemorrhage, or acute appearing infarct.   MRI of the brain  05/08/2013    No acute ischemia.  Moderate to severe white matter changes suggest chronic small vessel ischemic disease with remote bilateral basal ganglia and thalamic lacunar infarcts. Suspected empty sella.   MRA of the brain  05/08/2013    Normal MRA of the head, specifically no hemodynamically significant stenosis.     2D Echocardiogram  Cancelled by teaching service. Last one performed 09/15/2012 EF 60-65% with no source of embolus.   Carotid Doppler  No evidence of hemodynamically significant internal carotid artery stenosis. Vertebral artery flow is antegrade.   CXR  05/08/2013    No active cardiopulmonary disease.  Hyperinflated lungs.  EKG  Normal sinus rhythm. Left axis deviation. Minimal voltage criteria for LVH, may be normal variant.   Therapy Recommendations home health PT & OT  Physical Exam   Pleasant elderly lady not in distress.Awake alert. Afebrile. Head is nontraumatic. Neck is supple without bruit. Hearing is normal. Cardiac exam no murmur or gallop. Lungs are clear to auscultation. Distal pulses are well felt. Neurological Exam ;  Awake  Alert oriented x 3. Normal speech and language.eye movements full without nystagmus.fundi were not visualized. Vision acuity and fields appear normal. Hearing is normal. Palatal movements are normal. Face symmetric. Tongue midline. Normal strength, tone, reflexes and coordination. Diminished left face and upper extremity sensation. Gait deferred.  ASSESSMENT [name] is a 72 y.o. female presenting with dizziness and numbness left hand, arm, shoulder, neck, lip and ear. Dx:  a right brainstem infarct, too small to be seen on MRI (supported by history and resolution of symptoms today.) vs TIA. Infarct felt to be thrombotic secondary to small vessel disease.  On no antithrombotics prior to admission. Now on aspirin 81 mg orally every day for secondary stroke prevention. Patient with resultant mild dizziness and left sided hemisensory deficit upper extremity and body - which are all improving. Work up completed.  Hypertension  Hyperlipidemia, LDL 132, on pravachol PTA, now on lipitor 40, goal LDL < 100  Obesity, Body mass index is 33.43 kg/(m^2). Snores, feels tired  during the day ? obstructive sleep apnea  Cardiac consult - does not feel left arm numbness is cardiac related; no documented hx of atrial fibrillation Chads2Vasc=3, would not anticoagulate  Hospital day # 2  TREATMENT/PLAN RECOMMENDATIONS  Continue aspirin 81 mg orally every day for secondary stroke prevention.  OP eval for obstructive sleep apnea  Home health PT and OT arranged with [facility] No further stroke workup indicated. Patient has a 10-15% risk of having another stroke over the next year, the highest risk is within 2  weeks of the most recent stroke/TIA (risk of having a stroke following a stroke or TIA is the same). Ongoing risk factor control by Primary Care Physician Stroke Service will sign off. Please call should any needs arise. Follow up with Dr. Pearlean Brownie, Stroke Clinic, in 2 months.   Annie Main, MSN, RN, ANVP-BC, ANP-BC, [facility] Pager: [phone] 05/10/2013 8:33 AM  I have personally obtained a history, examined the patient, evaluated imaging results, and formulated the assessment and plan of care. I agree with the above.  Delia Heady, MD

## 2013-05-10 NOTE — Progress Notes (Signed)
Occupational Therapy Treatment Patient Details Name: [name] MRN: [id number] DOB: 06/03/1941 Today's Date: 05/10/2013 Time: 4098-1191 OT Time Calculation (min): 33 min  OT Assessment / Plan / Recommendation  History of present illness 72 year old woman with history of HTN, HL, borderline DM2 (A1C 6.4% in 11/2012), hypokalemia who presents who left arm numbness x 3 days. Patient woke up on Sunday 11/16 morning, got dressed for church then noticed her left arm up into her left face in felt numb. She went on to church where preacher prayed for her and numbness went away. She awoke on Monday 11/17 morning with numbness in her left upper lip and continued decreased sensation along the left cheek and ear.  MRI revealed no acute abnormalities with remote bilateral basal ganglia and thalamic lacunar infarcts.     OT comments  Pt reports improvement in numbness in L hand and forearm.  L UE is functional for ADL.  Instructed in safety related to impaired sensation.  Pt instructed in HEP with common objects and theraputty.  Pt needs a RW for safety at home.Educated pt in s/s of CVA and risk factors.  Follow Up Recommendations  Home health OT    Barriers to Discharge       Equipment Recommendations   (rolling walker)    Recommendations for Other Services    Frequency Min 2X/week   Progress towards OT Goals Progress towards OT goals: Progressing toward goals  Plan Discharge plan remains appropriate    Precautions / Restrictions Precautions Precautions: Fall   Pertinent Vitals/Pain Denies pain    ADL  Grooming: Wash/dry hands;Teeth care;Modified independent Where Assessed - Grooming: Unsupported standing Lower Body Dressing: Modified independent Where Assessed - Lower Body Dressing: Unsupported sitting;Supported sit to stand Toilet Transfer: Modified independent Toilet Transfer Method: Sit to Barista: Regular height toilet;Grab bars Toileting - Clothing  Manipulation and Hygiene: Modified independent Where Assessed - Toileting Clothing Manipulation and Hygiene: Sit to stand from 3-in-1 or toilet Equipment Used: Gait belt;Rolling walker Transfers/Ambulation Related to ADLs: mod I with RW, pt with a bad R knee ADL Comments: Instructed and issued med soft t-putty exercises.    OT Diagnosis:    OT Problem List:   OT Treatment Interventions:     OT Goals(current goals can now be found in the care plan section) Acute Rehab OT Goals Patient Stated Goal: to return home  Visit Information  Last OT Received On: 05/10/13 Assistance Needed: +1 History of Present Illness: 72 year old woman with history of HTN, HL, borderline DM2 (A1C 6.4% in 11/2012), hypokalemia who presents who left arm numbness x 3 days. Patient woke up on Sunday 11/16 morning, got dressed for church then noticed her left arm up into her left face in felt numb. She went on to church where preacher prayed for her and numbness went away. She awoke on Monday 11/17 morning with numbness in her left upper lip and continued decreased sensation along the left cheek and ear.  MRI revealed no acute abnormalities with remote bilateral basal ganglia and thalamic lacunar infarcts.      Subjective Data      Prior Functioning       Cognition  Cognition Arousal/Alertness: Awake/alert Behavior During Therapy: WFL for tasks assessed/performed Overall Cognitive Status: Within Functional Limits for tasks assessed    Mobility  Bed Mobility Bed Mobility: Supine to Sit;Sitting - Scoot to [place] of Bed;Sit to Supine;Scooting to [facility] Supine to Sit: 7: Independent Sitting - Scoot  to Edge of Bed: 7: Independent Transfers Transfers: Sit to Stand;Stand to Sit Sit to Stand: 6: Modified independent (Device/Increase time);With upper extremity assist;From bed;From toilet Stand to Sit: 6: Modified independent (Device/Increase time);With upper extremity assist;To chair/3-in-1;To toilet    Exercises       Balance     End of Session OT - End of Session Activity Tolerance: Patient tolerated treatment well Patient left: in chair;with call bell/phone within reach;with nursing/sitter in room  GO     Evern Bio 05/10/2013, 9:37 AM [phone]

## 2013-05-10 NOTE — Discharge Summary (Signed)
Name: [name] MRN: [id number] DOB: 11/28/1940 72 y.o. PCP: Farley Ly, MD  Date of Admission: 05/08/2013 12:13 PM Date of Discharge: 05/10/2013 Attending Physician: Jonah Blue, DO  Discharge Diagnosis: Active Problems:   HYPERLIPIDEMIA   HYPOKALEMIA   HYPERTENSION   Diastolic dysfunction-grade 2 history   Numbness  Discharge Medications:   Medication List    ASK your doctor about these medications       atenolol 50 MG tablet  Commonly known as:  TENORMIN  TAKE 1 TABLET (50 MG TOTAL) BY MOUTH DAILY.     cloNIDine 0.1 MG tablet  Commonly known as:  CATAPRES  Take 1 tablet (0.1 mg total) by mouth daily.     digoxin 0.125 MG tablet  Commonly known as:  LANOXIN  Take 0.125 mg by mouth daily.     furosemide 20 MG tablet  Commonly known as:  LASIX  Take 2 tablets (40 mg total) by mouth daily.     meloxicam 15 MG tablet  Commonly known as:  MOBIC  Take 15 mg by mouth daily.     potassium chloride SA 20 MEQ tablet  Commonly known as:  K-DUR,KLOR-CON  Take 20 mEq by mouth daily.     pravastatin 40 MG tablet  Commonly known as:  PRAVACHOL  Take 1 tablet (40 mg total) by mouth daily.     traMADol 50 MG tablet  Commonly known as:  ULTRAM  Take 1 tablet (50 mg total) by mouth every 6 (six) hours as needed.     Vitamin B-12 500 MCG Subl  Place 1 tablet under the tongue daily.     Vitamin D3 1000 UNITS Caps  Take 3 capsules (3,000 Units total) by mouth daily.        Disposition and follow-up:   [name] was discharged from [facility] in Stable condition.  At the hospital follow up visit please address:  1.  Persistence/recurrence of left sided numbness  2. BP control  3. Verify no symptoms from high potency statin  4.  Labs / imaging needed at time of follow-up: BMP  5.  Pending labs/ test needing follow-up: none  Follow-up Appointments:   Discharge Instructions:      Future Appointments Provider  Department Dept Phone   05/16/2013 9:15 AM Farley Ly, MD [facility] [phone]      Consultations: neurology, cardiology  Procedures Performed:  Dg Chest 2 View  05/08/2013   CLINICAL DATA:  Stroke, hypertension  EXAM: CHEST  2 VIEW  COMPARISON:  Chest radiograph 11/29/2008  FINDINGS: Normal cardiac silhouette. No effusion, infiltrate, or pneumothorax. Lungs are hyperinflated. Degenerative osteophytosis of the thoracic spine.  IMPRESSION: No active cardiopulmonary disease.  Hyperinflated lungs.   Electronically Signed   By: Genevive Bi M.D.   On: 05/08/2013 20:40   Ct Head (brain) Wo Contrast  05/08/2013   CLINICAL DATA:  Dizziness with left upper extremity numbness  EXAM: CT HEAD WITHOUT CONTRAST  TECHNIQUE: Contiguous axial images were obtained from the base of the skull through the vertex without intravenous contrast. Study was obtained within 24 hr of patient's arrival at the emergency department.  COMPARISON:  Report of prior study Nov 10, 2000; images from that study not available.  FINDINGS: The ventricles are normal in size and configuration. There is no mass, hemorrhage, extra-axial fluid collection, or midline shift. There is patchy small vessel disease in the centra semiovale bilaterally. There is also small  vessel disease in the anterior limbs of both internal and external capsules. No acute appearing infarct identified. Elsewhere gray-white compartments are normal.  Bony calvarium appears intact.  The mastoid air cells are clear.  IMPRESSION: Atrophy with small vessel disease. No demonstrable mass, hemorrhage, or acute appearing infarct.   Electronically Signed   By: Bretta Bang M.D.   On: 05/08/2013 13:12   Mr Brain Wo Contrast  05/08/2013   CLINICAL DATA:  Numbness and left face and cheek with intermittent left arm numbness.  EXAM: MRI HEAD WITHOUT CONTRAST  MRA HEAD WITHOUT CONTRAST  TECHNIQUE: Multiplanar, multiecho pulse sequences of  the brain and surrounding structures were obtained without intravenous contrast. Angiographic images of the head were obtained using MRA technique without contrast.  COMPARISON:  CT of head May 08, 2013 at 1303 hr.  FINDINGS: MRI HEAD FINDINGS  No reduced diffusion to suggest acute ischemia. A few punctate foci of T2 shine through within the left posterior frontal lobe, precentral gyrus. No susceptibility artifact to suggest hemorrhage. Tiny flow void and left thalamus and left globe pallidus.  The ventricles and sulci are normal for patient's age. Patchy to confluent supratentorial, pontine white matter T2 hyperintensities. Additional bilateral subcentimeter basal ganglia T2 hyperintensities, patchy T2 bright signal within the bilateral thalami . No midline shift, mass effect nor mass lesions.  No abnormal extra-axial fluid collections.  Patient is edentulous. Paranasal sinuses and mastoid air cells appear well-aerated. Ocular globes and orbital contents are nonsuspicious though not tailored for evaluation. No suspicious calvarial bone marrow signal. Mildly expanded predominant fluid filled apparent empty sella. Craniocervical junction maintained.  MRA HEAD FINDINGS  Anterior circulation: Normal flow related enhancement of the included cervical, petrous, cavernous and supra clinoid internal carotid arteries. Normal appearance of the anterior and middle cerebral arteries, including more distal segments with patent anterior communicating artery.  Posterior circulation: Codominant vertebral arteries, with mildly tortuous basilar artery, which shows gentle tapering, with compensatory request contribution from bilateral posterior communicating arteries (normal variant), and normal flow with normal flow related enhancement of the bilateral posterior cerebral arteries.  No large vessel occlusion, hemodynamically significant stenosis, suspicious luminal irregularity or dissection.  IMPRESSION: No acute ischemia.   Moderate to severe white matter changes suggest chronic small vessel ischemic disease with remote bilateral basal ganglia and thalamic lacunar infarcts. Suspected empty sella.  Normal MRA of the head, specifically no hemodynamically significant stenosis.   Electronically Signed   By: Awilda Metro   On: 05/08/2013 22:59   Mr Maxine Glenn Head/brain Wo Cm  05/08/2013   CLINICAL DATA:  Numbness and left face and cheek with intermittent left arm numbness.  EXAM: MRI HEAD WITHOUT CONTRAST  MRA HEAD WITHOUT CONTRAST  TECHNIQUE: Multiplanar, multiecho pulse sequences of the brain and surrounding structures were obtained without intravenous contrast. Angiographic images of the head were obtained using MRA technique without contrast.  COMPARISON:  CT of head May 08, 2013 at 1303 hr.  FINDINGS: MRI HEAD FINDINGS  No reduced diffusion to suggest acute ischemia. A few punctate foci of T2 shine through within the left posterior frontal lobe, precentral gyrus. No susceptibility artifact to suggest hemorrhage. Tiny flow void and left thalamus and left globe pallidus.  The ventricles and sulci are normal for patient's age. Patchy to confluent supratentorial, pontine white matter T2 hyperintensities. Additional bilateral subcentimeter basal ganglia T2 hyperintensities, patchy T2 bright signal within the bilateral thalami . No midline shift, mass effect nor mass lesions.  No abnormal extra-axial fluid collections.  Patient is edentulous. Paranasal sinuses and mastoid air cells appear well-aerated. Ocular globes and orbital contents are nonsuspicious though not tailored for evaluation. No suspicious calvarial bone marrow signal. Mildly expanded predominant fluid filled apparent empty sella. Craniocervical junction maintained.  MRA HEAD FINDINGS  Anterior circulation: Normal flow related enhancement of the included cervical, petrous, cavernous and supra clinoid internal carotid arteries. Normal appearance of the anterior and  middle cerebral arteries, including more distal segments with patent anterior communicating artery.  Posterior circulation: Codominant vertebral arteries, with mildly tortuous basilar artery, which shows gentle tapering, with compensatory request contribution from bilateral posterior communicating arteries (normal variant), and normal flow with normal flow related enhancement of the bilateral posterior cerebral arteries.  No large vessel occlusion, hemodynamically significant stenosis, suspicious luminal irregularity or dissection.  IMPRESSION: No acute ischemia.  Moderate to severe white matter changes suggest chronic small vessel ischemic disease with remote bilateral basal ganglia and thalamic lacunar infarcts. Suspected empty sella.  Normal MRA of the head, specifically no hemodynamically significant stenosis.   Electronically Signed   By: Awilda Metro   On: 05/08/2013 22:59    Admission HPI:  Ms. Palla is a 72 year old woman with history of HTN, HL, borderline DM2 (A1C 6.4% in 11/2012), hypokalemia who presents who left arm numbness x 3 days.  Patient woke up on Sunday 11/16 morning, got dressed for church then noticed her left arm up into her left face in felt numb. She went on to church where preacher prayed for her and numbness went away. She awoke on Monday 11/17 morning with numbness in her left upper lip and continued decreased sensation along the left cheek and ear. Symptoms improved somewhat throughout the day but then returned that evening, along with left arm numbness. She also dropped nail clippers she was holding in her left hand that she felt she should not have. She denies history of stroke and does not take ASA. No headache or confusion. CT head in ED negative for CVA.    Hospital Course by problem list: 1. Left arm and face numbness- Unclear etiology though small right brainstem infarct not seen on MRI vs. TIA per neuro. CT head showed atrophy with small vessel disease, no mass,  hemorrhage or acute infarct. MRI/MRA showed no acute ischemia, moderate to severe white matter changes suggest chronic small vessel disease with remote bilateral basal ganglia and thalamic lacunar infarcts; normal MRA of head without significant stenosis. Bilateral carotid dopplers negative. Echo in 08/2012 (EF 60-65%, grade II diastolic dysfunction, mild LVH), not necessary to repeat this admission per neuro recs. Stroke risk stratification: A1C 5.3%, LDL 132; patient was already on pravastatin 40 mg daily exchanged this for Lipitor 40 mg daily while inpatient and continued at discharge.  Also started patient on ASA 81 mg daily while inpatient and continued at discharge.  Due to concern that symptoms could represent anginal equivalent, cycled troponins which were negative x3 and consulted cardiology. TIMI score 1-2 (age, ?CAD risk fx). Unclear why patient takes digoxin though likely due to history of paroxysmal atrial fibrillation (digoxin level sub-therapeutic at 0.6 this admission). No further cardioogy workup necessary per cards recs.  PT/OT recommended home health PT/OT which was arranged as well as rolling walker and 3 in 1 which were ordered.    2. Hyopkalemia, resolved- K 2.8 on presentation, supped with K-dur 40 meq x 2, KCl IV 10 meq x 2. K remained above 4 on home K-dur 20 meq daily for remainder of admission  thus continued at discharge.   3. Hypertension- Held antihypertensives for permissive hypertension while inpatient, BP high 170s systolic as result.  Restarted home BP meds (atenolol, clonidine) at discharge.     Discharge Vitals:   BP 172/100  Pulse 74  Temp(Src) 98.1 F (36.7 C) (Oral)  Resp 18  Ht 5\' 9"  (1.753 m)  Wt 226 lb 8 oz (102.74 kg)  BMI 33.43 kg/m2  SpO2 97%  Discharge Labs:  Results for orders placed during the hospital encounter of 05/08/13 (from the past 24 hour(s))  DIGOXIN LEVEL     Status: Abnormal   Collection Time    05/09/13 11:46 AM      Result Value  Range   Digoxin Level 0.6 (*) 0.8 - 2.0 ng/mL  BASIC METABOLIC PANEL     Status: None   Collection Time    05/10/13  5:07 AM      Result Value Range   Sodium 140  135 - 145 mEq/L   Potassium 4.1  3.5 - 5.1 mEq/L   Chloride 104  96 - 112 mEq/L   CO2 25  19 - 32 mEq/L   Glucose, Bld 96  70 - 99 mg/dL   BUN 10  6 - 23 mg/dL   Creatinine, Ser 1.61  0.50 - 1.10 mg/dL   Calcium 9.5  8.4 - 09.6 mg/dL   GFR calc non Af Amer >90  >90 mL/min   GFR calc Af Amer >90  >90 mL/min    Signed: Rocco Serene, MD 05/10/2013, 8:13 AM   Time Spent on Discharge: 35 minutes Services Ordered on Discharge: [facility] PT/OT Equipment Ordered on Discharge: rolling walker, 3 in 1

## 2013-05-10 NOTE — Progress Notes (Signed)
INTERNAL MEDICINE ATTENDING NOTE Date: 05/10/2013  Patient name: [name]  Medical record number: [id number]  Date of birth: [birth year]   Assessment  72 year old lady with persistent left sided numbness for 3 days with no prior history of stroke. She admits to resolution of symptoms today. She is currently working with physical therapy. Her work up so far has been negative for stroke. Per neurology, DDx seems to be a right brainstem infarct, too small to be seen on MRI, or a TIA. On exam she appeared to be doing well with no residual weakness, did not report any numbness to me, had no cranial nerve deficits. She was walking with the help of a walker. Other exam was non-significant for any disease related concerns.   Plan    Minor stroke with no residual vs TIA - Patient on Aspirin currently. For risk factor control, she is on Lipitor, and needs further and on going control of her HTN. Nutrition counseling and obesity control as outpatient. She has follow up with Dr Pearlean Brownie and is already working with PT (who think that she is a candidate for home PT).   Cardiology does not think the pain is cardiac related.   At this time, no further stroke work up is indicated.   I think it would be okay to discharge the patient if she progresses well today. She is set to follow her PCP Dr. Meredith Pel on 05/16/13 next week.      Thanks, Darien Mignogna 05/10/2013, 1:10 PM.

## 2013-05-10 NOTE — Progress Notes (Signed)
Physical Therapy Treatment Patient Details Name: [name] MRN: [id number] DOB: May 22, 1941 Today's Date: 05/10/2013 Time: 1324-4010 PT Time Calculation (min): 23 min  PT Assessment / Plan / Recommendation  History of Present Illness 72 year old woman with history of HTN, HL, borderline DM2 (A1C 6.4% in 11/2012), hypokalemia who presents who left arm numbness x 3 days. Patient woke up on Sunday 11/16 morning, got dressed for church then noticed her left arm up into her left face in felt numb. She went on to church where preacher prayed for her and numbness went away. She awoke on Monday 11/17 morning with numbness in her left upper lip and continued decreased sensation along the left cheek and ear.  MRI revealed no acute abnormalities with remote bilateral basal ganglia and thalamic lacunar infarcts.     PT Comments   Pt doing well, nearing modified independent level. Recommend pt's daughters check on pt regularly when home. Also recommend Pt utilize RW with all ambulation. She will benefit from 3n1 bedside commode due to continued risk of fall when pt in a hurry to get to bathroom at night (pt reports this happens often). Will continue to follow.  Follow Up Recommendations  Home health PT;Supervision - Intermittent (recommended pt have Safety Alert or some other way to contact assist in case of fall  or another stroke. )           Equipment Recommendations  Rolling walker with 5" wheels;3in1 (PT)       Frequency Min 3X/week   Progress towards PT Goals Progress towards PT goals: Progressing toward goals  Plan Current plan remains appropriate    Precautions / Restrictions   Fall  Pertinent Vitals/Pain No c/o    Mobility  Transfers Sit to Stand: 6: Modified independent (Device/Increase time);With upper extremity assist;From bed;From toilet Stand to Sit: 6: Modified independent (Device/Increase time);With upper extremity assist;To chair/3-in-1;To  toilet Ambulation/Gait Ambulation/Gait Assistance: 5: Supervision;6: Modified independent (Device/Increase time) Ambulation Distance (Feet): 150 Feet Assistive device: Rolling walker Ambulation/Gait Assistance Details: supervision/modified independent with RW, needs RW for ambulation and pt verbalizes understanding she will be using this at home.  Gait Pattern: Decreased stride length;Trunk flexed;Step-through pattern (decreased foot clearance) Gait velocity: decreased    Exercises Other Exercises Other Exercises: Pt practiced balance reaction exercise program with varied bases of support, standing, backed into corner with RW in front.      PT Goals (current goals can now be found in the care plan section) Acute Rehab PT Goals Patient Stated Goal: to return home  Visit Information  Last PT Received On: 05/10/13 Assistance Needed: +1 History of Present Illness: 72 year old woman with history of HTN, HL, borderline DM2 (A1C 6.4% in 11/2012), hypokalemia who presents who left arm numbness x 3 days. Patient woke up on Sunday 11/16 morning, got dressed for church then noticed her left arm up into her left face in felt numb. She went on to church where preacher prayed for her and numbness went away. She awoke on Monday 11/17 morning with numbness in her left upper lip and continued decreased sensation along the left cheek and ear.  MRI revealed no acute abnormalities with remote bilateral basal ganglia and thalamic lacunar infarcts.      Subjective Data  Patient Stated Goal: to return home   Cognition  Cognition Arousal/Alertness: Awake/alert Behavior During Therapy: WFL for tasks assessed/performed Overall Cognitive Status: Within Functional Limits for tasks assessed    Balance  Balance Balance Assessed: Yes Dynamic  Sitting Balance Dynamic Sitting - Balance Support: No upper extremity supported;Feet unsupported;During functional activity Dynamic Sitting - Level of Assistance: 6:  Modified independent (Device/Increase time) Static Standing Balance Static Standing - Balance Support: No upper extremity supported;During functional activity Static Standing - Level of Assistance: 6: Modified independent (Device/Increase time) Dynamic Standing Balance Dynamic Standing - Balance Support: No upper extremity supported;During functional activity Dynamic Standing - Level of Assistance: 5: Stand by assistance;6: Modified independent (Device/Increase time)  End of Session PT - End of Session Equipment Utilized During Treatment: Gait belt Activity Tolerance: Patient tolerated treatment well Patient left: in bed;with call bell/phone within reach Nurse Communication: Mobility status   GP     Wilhemina Bonito 05/10/2013, 1:08 PM

## 2013-05-16 ENCOUNTER — Ambulatory Visit (INDEPENDENT_AMBULATORY_CARE_PROVIDER_SITE_OTHER): Payer: PRIVATE HEALTH INSURANCE | Admitting: Internal Medicine

## 2013-05-16 ENCOUNTER — Encounter: Payer: Self-pay | Admitting: Internal Medicine

## 2013-05-16 VITALS — BP 137/84 | HR 67 | Temp 98.2°F | Wt 234.5 lb

## 2013-05-16 DIAGNOSIS — I10 Essential (primary) hypertension: Secondary | ICD-10-CM

## 2013-05-16 DIAGNOSIS — I6350 Cerebral infarction due to unspecified occlusion or stenosis of unspecified cerebral artery: Secondary | ICD-10-CM

## 2013-05-16 DIAGNOSIS — E785 Hyperlipidemia, unspecified: Secondary | ICD-10-CM

## 2013-05-16 DIAGNOSIS — E876 Hypokalemia: Secondary | ICD-10-CM

## 2013-05-16 DIAGNOSIS — I639 Cerebral infarction, unspecified: Secondary | ICD-10-CM

## 2013-05-16 DIAGNOSIS — R609 Edema, unspecified: Secondary | ICD-10-CM

## 2013-05-16 DIAGNOSIS — M549 Dorsalgia, unspecified: Secondary | ICD-10-CM

## 2013-05-16 LAB — BASIC METABOLIC PANEL WITH GFR
BUN: 11 mg/dL (ref 6–23)
Calcium: 9.6 mg/dL (ref 8.4–10.5)
Chloride: 106 mEq/L (ref 96–112)
Creat: 0.52 mg/dL (ref 0.50–1.10)
GFR, Est African American: >89 mL/min
GFR, Est Non African American: >89 mL/min
Potassium: 4 mEq/L (ref 3.5–5.3)

## 2013-05-16 NOTE — Progress Notes (Signed)
   Subjective:    Patient ID: [name], female    DOB: 1941-04-21, 72 y.o.   MRN: [id number]  HPI Patient returns for followup of a recent hospitalization 11/18 through 05/10/2013 with left facial numbness and left arm numbness thought to be due to a CVA or TIA, and for followup of her chronic medical problems.  Her workup during her hospitalization showed no evidence of acute stroke on imaging, but clinically this was felt to be consistent with a small stroke or possibly a TIA.  Her left facial and left arm numbness resolved; she reports some residual subjective numbness in her left hand.  Her cholesterol medication was changed from pravastatin to atorvastatin, and she reports that she has been taking the atorvastatin without any apparent side effects.  She has resumed her home blood pressure medications without problems.  She has some chronic back pain, for which she takes meloxicam supplemented by tramadol when needed.  She has mild chronic leg swelling, but this is stable.  She reports that she has been compliant with her medications.  Current medications, past medical history, past surgical history, family history, and social history were reviewed and updated.  Review of Systems  Constitutional: Negative for fever, chills and diaphoresis.  Respiratory: Negative for choking, shortness of breath and wheezing.   Cardiovascular: Positive for leg swelling (Mild). Negative for chest pain.  Gastrointestinal: Negative for nausea, vomiting and abdominal pain.  Genitourinary: Negative for dysuria and frequency.  Musculoskeletal: Positive for back pain (Chronic).  Neurological: Positive for numbness (Left hand). Negative for dizziness, syncope, speech difficulty, weakness and headaches.       Objective:   Physical Exam  Constitutional: She is oriented to person, place, and time. No distress.  Cardiovascular: Normal rate and regular rhythm.  Exam reveals no gallop and no friction rub.   No  murmur heard. Trace bilateral leg edema  Pulmonary/Chest: Effort normal and breath sounds normal. No respiratory distress. She has no wheezes. She has no rales.  Abdominal: Soft. Bowel sounds are normal. She exhibits no distension. There is no hepatosplenomegaly. There is no tenderness. There is no rebound and no guarding.  Neurological: She is alert and oriented to person, place, and time. She has normal strength. No cranial nerve deficit or sensory deficit.        Assessment & Plan:

## 2013-05-16 NOTE — Assessment & Plan Note (Signed)
BP Readings from Last 3 Encounters:  05/16/13 137/84  05/10/13 107/55  04/17/13 149/79    Lab Results  Component Value Date   NA 142 05/16/2013   K 4.0 05/16/2013   CREATININE 0.52 05/16/2013    Assessment: Blood pressure is at goal on atenolol 50 mg daily, clonidine 0.1 mg daily, and furosemide 40 mg daily.    Plan: Medications:  continue current medications

## 2013-05-16 NOTE — Assessment & Plan Note (Signed)
Lipids:    Component Value Date/Time   CHOL 205* 05/09/2013 0108   TRIG 68 05/09/2013 0108   HDL 59 05/09/2013 0108   LDLCALC 132* 05/09/2013 0108   VLDL 14 05/09/2013 0108   CHOLHDL 3.5 05/09/2013 0108    Assessment: Patient was switched from pravastatin to atorvastatin during her recent hospitalization, and is tolerating the atorvastatin without problems.  Plan: Continue atorvastatin 40 mg daily; recheck lipid panel and liver panel in about 3 months.

## 2013-05-16 NOTE — Assessment & Plan Note (Signed)
Assessment: Patient reports some residual subjective left hand numbness following her discharge home from the hospital last week.  Otherwise she is doing well without symptoms.  Plan: Continue aspirin 81 mg daily; continue blood pressure control; patient is now on atorvastatin for better control of her hyperlipidemia.  I advised her to go to the ED immediately if she has any recurrent neurologic symptoms, and she agreed to do so.  She has a followup appointment with neurologist Dr. Pearlean Brownie in January.

## 2013-05-16 NOTE — Assessment & Plan Note (Signed)
Assessment: Patient has mild leg edema, likely due to diastolic dysfunction.  It is reasonably well-controlled on her current furosemide dose of 40 mg daily.  Plan: Continue furosemide 40 mg daily.

## 2013-05-16 NOTE — Assessment & Plan Note (Signed)
Assessment: Patient's chronic back pain is stable and is reasonably well-controlled on use of meloxicam 15 mg daily as needed, and tramadol 50 mg every 6 hours as needed for pain.  Plan: Continue current regimen.

## 2013-05-16 NOTE — Assessment & Plan Note (Signed)
Basic Metabolic Panel:    Component Value Date/Time   NA 142 05/16/2013 1049   K 4.0 05/16/2013 1049   CL 106 05/16/2013 1049   CO2 29 05/16/2013 1049   BUN 11 05/16/2013 1049   CREATININE 0.52 05/16/2013 1049   CREATININE 0.50 05/10/2013 0507   GLUCOSE 100* 05/16/2013 1049   CALCIUM 9.6 05/16/2013 1049   Assessment: Patient's hypokalemia has corrected on potassium chloride 20 mEq daily.  Plan:  Continue potassium chloride 20 mEq daily.

## 2013-05-16 NOTE — Patient Instructions (Signed)
Continue current medications. 

## 2013-05-22 ENCOUNTER — Telehealth: Payer: Self-pay | Admitting: *Deleted

## 2013-05-22 NOTE — Telephone Encounter (Signed)
[name] with [facility] called [phone]  to get verbal order for home skilled nurse to monitor pt taking meds correctly. BP this AM 162/99 pulse 81 - pt had not taken BP med today.Pt took meds while nurse was there and set up pill box. A verbal order given for skilled nurse regarding meds. [name] RN 05/22/13 3:20PM

## 2013-05-22 NOTE — Telephone Encounter (Signed)
Agree with verbal order 

## 2013-05-25 ENCOUNTER — Other Ambulatory Visit (HOSPITAL_COMMUNITY): Payer: Self-pay | Admitting: Internal Medicine

## 2013-05-29 ENCOUNTER — Emergency Department (HOSPITAL_COMMUNITY): Payer: PRIVATE HEALTH INSURANCE

## 2013-05-29 ENCOUNTER — Other Ambulatory Visit: Payer: Self-pay

## 2013-05-29 ENCOUNTER — Encounter (HOSPITAL_COMMUNITY): Payer: Self-pay | Admitting: Emergency Medicine

## 2013-05-29 ENCOUNTER — Inpatient Hospital Stay (HOSPITAL_COMMUNITY)
Admission: EM | Admit: 2013-05-29 | Discharge: 2013-05-30 | DRG: 066 | Disposition: A | Discharge status: Home / Self Care | Payer: PRIVATE HEALTH INSURANCE | Attending: Internal Medicine | Admitting: Internal Medicine

## 2013-05-29 DIAGNOSIS — G459 Transient cerebral ischemic attack, unspecified: Secondary | ICD-10-CM

## 2013-05-29 DIAGNOSIS — I6381 Other cerebral infarction due to occlusion or stenosis of small artery: Secondary | ICD-10-CM | POA: Diagnosis present

## 2013-05-29 DIAGNOSIS — G609 Hereditary and idiopathic neuropathy, unspecified: Secondary | ICD-10-CM | POA: Diagnosis present

## 2013-05-29 DIAGNOSIS — R209 Unspecified disturbances of skin sensation: Secondary | ICD-10-CM

## 2013-05-29 DIAGNOSIS — Z880 Allergy status to penicillin: Secondary | ICD-10-CM

## 2013-05-29 DIAGNOSIS — M549 Dorsalgia, unspecified: Secondary | ICD-10-CM

## 2013-05-29 DIAGNOSIS — I6350 Cerebral infarction due to unspecified occlusion or stenosis of unspecified cerebral artery: Secondary | ICD-10-CM

## 2013-05-29 DIAGNOSIS — M545 Low back pain, unspecified: Secondary | ICD-10-CM | POA: Diagnosis present

## 2013-05-29 DIAGNOSIS — I671 Cerebral aneurysm, nonruptured: Secondary | ICD-10-CM

## 2013-05-29 DIAGNOSIS — I10 Essential (primary) hypertension: Secondary | ICD-10-CM

## 2013-05-29 DIAGNOSIS — Z8041 Family history of malignant neoplasm of ovary: Secondary | ICD-10-CM

## 2013-05-29 DIAGNOSIS — E559 Vitamin D deficiency, unspecified: Secondary | ICD-10-CM | POA: Diagnosis present

## 2013-05-29 DIAGNOSIS — Z9089 Acquired absence of other organs: Secondary | ICD-10-CM

## 2013-05-29 DIAGNOSIS — Z87891 Personal history of nicotine dependence: Secondary | ICD-10-CM

## 2013-05-29 DIAGNOSIS — R609 Edema, unspecified: Secondary | ICD-10-CM | POA: Diagnosis present

## 2013-05-29 DIAGNOSIS — G8929 Other chronic pain: Secondary | ICD-10-CM | POA: Diagnosis present

## 2013-05-29 DIAGNOSIS — Z8673 Personal history of transient ischemic attack (TIA), and cerebral infarction without residual deficits: Secondary | ICD-10-CM

## 2013-05-29 DIAGNOSIS — E785 Hyperlipidemia, unspecified: Secondary | ICD-10-CM | POA: Diagnosis present

## 2013-05-29 DIAGNOSIS — I4891 Unspecified atrial fibrillation: Secondary | ICD-10-CM | POA: Diagnosis present

## 2013-05-29 DIAGNOSIS — I6340 Cerebral infarction due to embolism of unspecified cerebral artery: Principal | ICD-10-CM

## 2013-05-29 DIAGNOSIS — E669 Obesity, unspecified: Secondary | ICD-10-CM | POA: Diagnosis present

## 2013-05-29 DIAGNOSIS — Z8249 Family history of ischemic heart disease and other diseases of the circulatory system: Secondary | ICD-10-CM

## 2013-05-29 DIAGNOSIS — Z6833 Body mass index (BMI) 33.0-33.9, adult: Secondary | ICD-10-CM

## 2013-05-29 DIAGNOSIS — I639 Cerebral infarction, unspecified: Secondary | ICD-10-CM

## 2013-05-29 DIAGNOSIS — Z882 Allergy status to sulfonamides status: Secondary | ICD-10-CM

## 2013-05-29 LAB — POCT I-STAT, CHEM 8
BUN: 11 mg/dL (ref 6–23)
Glucose, Bld: 114 mg/dL — ABNORMAL HIGH (ref 70–99)
HCT: 38 % (ref 36.0–46.0)
Hemoglobin: 12.9 g/dL (ref 12.0–15.0)
Potassium: 3.6 mEq/L (ref 3.5–5.1)
Sodium: 143 mEq/L (ref 135–145)

## 2013-05-29 LAB — COMPREHENSIVE METABOLIC PANEL
ALT: 10 U/L (ref 0–35)
Albumin: 3.8 g/dL (ref 3.5–5.2)
Alkaline Phosphatase: 144 U/L — ABNORMAL HIGH (ref 39–117)
BUN: 12 mg/dL (ref 6–23)
CO2: 28 mEq/L (ref 19–32)
Calcium: 9.5 mg/dL (ref 8.4–10.5)
GFR calc Af Amer: >90 mL/min (ref >90)
GFR calc non Af Amer: >90 mL/min (ref >90)
Glucose, Bld: 111 mg/dL — ABNORMAL HIGH (ref 70–99)
Potassium: 4 mEq/L (ref 3.5–5.1)
Sodium: 140 mEq/L (ref 135–145)

## 2013-05-29 LAB — RAPID URINE DRUG SCREEN, HOSP PERFORMED: Barbiturates: NOT DETECTED

## 2013-05-29 LAB — PROTIME-INR
INR: 1.05 (ref 0.00–1.49)
Prothrombin Time: 13.5 seconds (ref 11.6–15.2)

## 2013-05-29 LAB — CBC
Hemoglobin: 12.4 g/dL (ref 12.0–15.0)
MCV: 85.5 fL (ref 78.0–100.0)
Platelets: 167 10*3/uL (ref 150–400)
RBC: 4.41 MIL/uL (ref 3.87–5.11)
WBC: 5.1 10*3/uL (ref 4.0–10.5)

## 2013-05-29 LAB — DIFFERENTIAL
Eosinophils Absolute: 0.1 10*3/uL (ref 0.0–0.7)
Eosinophils Relative: 2 % (ref 0–5)
Lymphocytes Relative: 31 % (ref 12–46)
Lymphs Abs: 1.6 10*3/uL (ref 0.7–4.0)
Monocytes Absolute: 0.3 10*3/uL (ref 0.1–1.0)
Monocytes Relative: 6 % (ref 3–12)
Neutrophils Relative %: 61 % (ref 43–77)

## 2013-05-29 LAB — URINALYSIS, ROUTINE W REFLEX MICROSCOPIC
Leukocytes, UA: NEGATIVE
Nitrite: NEGATIVE
Specific Gravity, Urine: 1.014 (ref 1.005–1.030)
pH: 6.5 (ref 5.0–8.0)

## 2013-05-29 LAB — GLUCOSE, CAPILLARY: Glucose-Capillary: 117 mg/dL — ABNORMAL HIGH (ref 70–99)

## 2013-05-29 MED ORDER — HEPARIN SODIUM (PORCINE) 5000 UNIT/ML IJ SOLN
5000.0000 [IU] | Freq: Three times a day (TID) | INTRAMUSCULAR | Status: DC
  Administered 2013-05-29 – 2013-05-30 (×3): 5000 [IU] via SUBCUTANEOUS
  Filled 2013-05-29 (×5): qty 1

## 2013-05-29 MED ORDER — CLOPIDOGREL BISULFATE 75 MG PO TABS
75.0000 mg | ORAL_TABLET | Freq: Every day | ORAL | Status: DC
  Administered 2013-05-30: 75 mg via ORAL
  Filled 2013-05-29 (×2): qty 1

## 2013-05-29 MED ORDER — ATORVASTATIN CALCIUM 40 MG PO TABS
40.0000 mg | ORAL_TABLET | Freq: Every day | ORAL | Status: DC
  Filled 2013-05-29: qty 1

## 2013-05-29 MED ORDER — ASPIRIN 81 MG PO CHEW
324.0000 mg | CHEWABLE_TABLET | Freq: Once | ORAL | Status: AC
  Administered 2013-05-29: 324 mg via ORAL
  Filled 2013-05-29: qty 4

## 2013-05-29 MED ORDER — ONDANSETRON HCL 4 MG PO TABS: 4.0000 mg | ORAL_TABLET | Freq: Four times a day (QID) | ORAL | Status: DC | PRN

## 2013-05-29 MED ORDER — ONDANSETRON HCL 4 MG/2ML IJ SOLN: 4.0000 mg | Freq: Four times a day (QID) | INTRAMUSCULAR | Status: DC | PRN

## 2013-05-29 MED ORDER — SODIUM CHLORIDE 0.9 % IJ SOLN
3.0000 mL | Freq: Two times a day (BID) | INTRAMUSCULAR | Status: DC
  Administered 2013-05-30: 3 mL via INTRAVENOUS

## 2013-05-29 MED ORDER — ACETAMINOPHEN 325 MG PO TABS: 650.0000 mg | ORAL_TABLET | Freq: Four times a day (QID) | ORAL | Status: DC | PRN

## 2013-05-29 MED ORDER — ACETAMINOPHEN 650 MG RE SUPP: 650.0000 mg | Freq: Four times a day (QID) | RECTAL | Status: DC | PRN

## 2013-05-29 MED ORDER — SODIUM CHLORIDE 0.9 % IV SOLN: 100.0000 mL/h | INTRAVENOUS | Status: DC

## 2013-05-29 MED ORDER — DIGOXIN 125 MCG PO TABS
0.1250 mg | ORAL_TABLET | Freq: Every day | ORAL | Status: DC
  Administered 2013-05-29 – 2013-05-30 (×2): 0.125 mg via ORAL
  Filled 2013-05-29 (×2): qty 1

## 2013-05-29 MED ORDER — CLOPIDOGREL BISULFATE 75 MG PO TABS: 75.0000 mg | ORAL_TABLET | Freq: Every day | ORAL | Status: DC

## 2013-05-29 MED ORDER — SODIUM CHLORIDE 0.9 % IV BOLUS (SEPSIS)
500.0000 mL | Freq: Once | INTRAVENOUS | Status: AC
  Administered 2013-05-29: 500 mL via INTRAVENOUS

## 2013-05-29 NOTE — Consult Note (Addendum)
NEURO HOSPITALIST CONSULT NOTE    Reason for Consult:left face and hand numbness, and " dull hearing".  HPI:                                                                                                                                          [name] is an 72 y.o. female, right handed, with a past medical history significant for HTN, hyperlipidemia, DJD, chronic low back pain, peripheral neuropathy, comes in today for further evaluation of the above stated symptoms. She was admitted to [facility] couple of weeks ago with the same symptomatology and had extensive cerebrovascular work up that was unrevealing. [name] stated that when she left the hospital she was still having " some numbness and tingling in my left face, mainly around the left cheek and ear as well as my left hand, it never went completely away", but when she woke up around 8 am today the numbness was " stronger" and she also became very lightheaded when she stood up.  Additionally, she noticed that her hearing was " dull'. She said that she called her sister on the phone and decided to come to the ED. Denies associated HA, vertigo, double vision, difficulty swallowing, focal weakness, imbalance, slurred speech, language or visual impairment. Takes aspirin 81 mg daily. CT brain today unrevealing.   Past Medical History  Diagnosis Date  . Hypertension   . Hyperlipemia   . DJD (degenerative joint disease)   . Chronic low back pain   . Abnormal electromyogram (EMG)     Low grade right S1 radiculopathy 7/93.  [name] Hot flashes   . Rotator cuff syndrome of right shoulder     S/P right shoulder arthroscopic debridement of a massive rotator cuff tear, greater tuberosity and resection of subacromial spur by Dr. Gean Birchwood 07/07/2005.  [name] Herniated lumbar intervertebral disc     S/P L4-5 & L5-S1 laminotomy, foraminotomy, and L5-S1 diskectomy by Dr. Trey Sailors 11/01/2000.  . Multiple joint pain   . Iron deficiency  anemia   . Vitamin D deficiency   . SVT (supraventricular tachycardia)     a. pt says she was told she had afib in the 90's and has been on digoxin since.  . Bilateral leg edema   . Peripheral neuropathy   . Osteopenia   . Seasonal allergic rhinitis   . Elevated alkaline phosphatase level     Bone scan 08/2004 showed no evidence of abnormal bony activity..  . Skin tag     Right axillary area.  . Hypokalemia   . Headache(784.0)   . Diastolic dysfunction     a. 08/2012 Echo: EF 60-65%, Gr 2 DD, triv MR, PASP .  [name] Left arm numbness     ? 04/2013  .  Chest pain     a. reports nl cath in the 1990's.    Past Surgical History  Procedure Laterality Date  . Total abdominal hysterectomy  1992  . Lumbar disc surgery  11/01/2000     L4-5, L5-S1 laminotomy, foraminotomy and L5-S1 diskectomy  . Shoulder arthroscopy  1/172007    S/P right shoulder arthroscopic debridement of a massive rotator cuff tear, greater tuberoplasty and resection of subacromial spur by Dr. Gean Birchwood on 07/07/2005.  [name] Cholecystectomy, laparoscopic  1998  . Cholecystectomy      Family History  Problem Relation Age of Onset  . Ovarian cancer Mother 18  . Hypertension Mother   . Heart attack Mother 90  . Hypertension Sister   . Hypertension Sister   . Breast cancer Neg Hx   . Colon cancer Neg Hx   . Lung cancer Neg Hx   . Cancer Brother   . Neuropathy Sister     Social History:  reports that she quit smoking about 13 years ago. She has never used smokeless tobacco. She reports that she does not drink alcohol or use illicit drugs.  Allergies  Allergen Reactions  . Penicillins     REACTION: Local swelling in right arm after injection.  . Sulfonamide Derivatives     REACTION: Shortness of breath    MEDICATIONS:                                                                                                                     I have reviewed the patient's current medications.   ROS:                                                                                                                                        History obtained from the patient and chart review.  General ROS: negative for - chills, fatigue, fever, night sweats, weight gain or weight loss Psychological ROS: negative for - behavioral disorder, hallucinations, memory difficulties, mood swings or suicidal ideation Ophthalmic ROS: negative for - blurry vision, double vision, eye pain or loss of vision ENT ROS: negative for - epistaxis, nasal discharge, oral lesions, sore throat, tinnitus or vertigo Allergy and Immunology ROS: negative for - hives or itchy/watery eyes Hematological and Lymphatic ROS: negative for - bleeding problems, bruising or swollen lymph nodes Endocrine ROS: negative for - galactorrhea, hair pattern changes, polydipsia/polyuria or temperature intolerance Respiratory ROS:  negative for - cough, hemoptysis, shortness of breath or wheezing Cardiovascular ROS: negative for - chest pain, dyspnea on exertion, edema or irregular heartbeat Gastrointestinal ROS: negative for - abdominal pain, diarrhea, hematemesis, nausea/vomiting or stool incontinence Genito-Urinary ROS: negative for - dysuria, hematuria, incontinence or urinary frequency/urgency Musculoskeletal ROS: negative for - joint swelling or muscular weakness Neurological ROS: as noted in HPI Dermatological ROS: negative for rash and skin lesion changes   Physical exam: pleasant female in no apparent distress. Blood pressure 174/80, pulse 75, temperature 98.4 F (36.9 C), temperature source Oral, resp. rate 11, SpO2 99.00%. Head: normocephalic. Neck: supple, no bruits, no JVD. Cardiac: no murmurs. Lungs: clear. Abdomen: soft, no tender, no mass. Extremities: no edema.  Neurologic Examination:                                                                                                      Mental Status: Alert, awake, oriented x 4, thought content  appropriate.  Speech fluent without evidence of aphasia.  Able to follow 3 step commands without difficulty. Cranial Nerves: II: Discs flat bilaterally; Visual fields grossly normal, pupils equal, round, reactive to light and accommodation III,IV, VI: ptosis not present, extra-ocular motions intact bilaterally V,VII: smile symmetric, facial light touch sensation normal bilaterally VIII: hearing normal bilaterally IX,X: gag reflex present XI: bilateral shoulder shrug XII: midline tongue extension without atrophy or fasciculations  Motor: Right : Upper extremity   5/5    Left:     Upper extremity   5/5  Lower extremity   5/5     Lower extremity   5/5 Tone and bulk:normal tone throughout; no atrophy noted Sensory: Pinprick and light touch intact slightly diminished left check and left arm. Deep Tendon Reflexes:  Right: Upper Extremity   Left: Upper extremity   biceps (C-5 to C-6) 2/4   biceps (C-5 to C-6) 2/4 tricep (C7) 2/4    triceps (C7) 2/4 Brachioradialis (C6) 2/4  Brachioradialis (C6) 2/4  Lower Extremity Lower Extremity  quadriceps (L-2 to L-4) 2/4   quadriceps (L-2 to L-4) 2/4 Achilles (S1) 2/4   Achilles (S1) 2/4  Plantars: Right: downgoing   Left: downgoing Cerebellar: normal finger-to-nose,  normal heel-to-shin test Gait:  No tested CV: pulses palpable throughout    Lab Results  Component Value Date/Time   CHOL 205* 05/09/2013  1:08 AM    Results for orders placed during the hospital encounter of 05/29/13 (from the past 48 hour(s))  GLUCOSE, CAPILLARY     Status: Abnormal   Collection Time    05/29/13 11:23 AM      Result Value Range   Glucose-Capillary 117 (*) 70 - 99 mg/dL    No results found.  Assessment/Plan: Pleasant 72 y/o with HTN, hyperlipidemia, comes in today with recurrent paresthesias involving left face and left hand with a patten describe above, associated with new onset transient lightheadedness and " dull hearing". Neuro-exam is not really  impressive but mild diminished sensation left face and had which is not new " but stronger" than before. CT brain unrevealing. Considering her clinical  history it will be prudent to obtain MRI brain to exclude involvement of the thalamic or parietal structures in the right brain. Can be discharge home if MRI brain is negative, as she had extensive but unrevealing work up just coupleof weeks ago. Continue aspirin.     Wyatt Portela, MD 05/29/2013, 11:37 AM   Addendum: MRI-DWI confirms a small acute/ subacute nonhemorrhagic infarct lateral aspect of the  right thalamus. Tiny acute/subacute right paracentral pontine infarct may also be present. Infarcts most likely due to small vessel disease. Consider switching to plavix. Will ask stroke team to evaluate patient is am. Of note, stroke work up was recently done.

## 2013-05-29 NOTE — H&P (Signed)
  I have seen and examined the patient myself, and I have reviewed the note by Crist Infante, MS IV and was present during the interview and physical exam.  Please see my separate H&P for additional findings, assessment, and plan.   Signed: Ky Barban, MD 05/29/2013, 8:43 PM

## 2013-05-29 NOTE — ED Provider Notes (Addendum)
Medical screening examination/treatment/procedure(s) were conducted as a shared visit with non-physician practitioner(s) and myself.  I personally evaluated the patient during the encounter.   Date: 05/29/2013  Rate: 69  Rhythm: normal sinus rhythm  QRS Axis: normal  Intervals: normal  ST/T Wave abnormalities: normal  Conduction Disutrbances:none  Narrative Interpretation:   Old EKG Reviewed: unchanged   Patient seen and examined. Speech is normal. Strength is normal on all 4 extremities. No facial droop. No confusion noted. Patient notes subjective left-sided paresthesias. Awaiting head CT and patient likely to require MRI  Toy Baker, MD 05/29/13 1046  Toy Baker, MD 05/29/13 1128

## 2013-05-29 NOTE — Progress Notes (Signed)
Paged admitting IMTS upon patients arrival to floor. Awaiting orders

## 2013-05-29 NOTE — ED Provider Notes (Signed)
CSN: [id number]     Arrival date & time 05/29/13  1009 History   First MD Initiated Contact with Patient 05/29/13 1013     No chief complaint on file.  (Consider location/radiation/quality/duration/timing/severity/associated sxs/prior Treatment) HPI 72 year old female who was recently hospitalized from November 18th to November 20th for left facial numbness and left arm numbness which thought to be due to CVA or TIA. Workup shows no evidence of acute stroke.  Her L facial and L arm numbness resolved.  However this morning she woke up from bed, felt lightheadedness and unsteady on her feet.  She subsequently experienced tingling sensation to the left side of face, radiates to L neck. Also having tingling sensation to L arm, hand, and L thigh.  sxs has been ongoing for 2 1/2 hrs.  No specific treatment tried.  No complaint of headache, double vision, slurred speech, dificulty thinking, chest pain, shortness of breath, neck pain, abdominal pain, nausea, vomiting, diarrhea, dysuria. Does have history of atrial fibrillation currently on digoxin.      Past Medical History  Diagnosis Date  . Hypertension   . Hyperlipemia   . DJD (degenerative joint disease)   . Chronic low back pain   . Abnormal electromyogram (EMG)     Low grade right S1 radiculopathy 7/93.  [name] Hot flashes   . Rotator cuff syndrome of right shoulder     S/P right shoulder arthroscopic debridement of a massive rotator cuff tear, greater tuberosity and resection of subacromial spur by Dr. Gean Birchwood 07/07/2005.  [name] Herniated lumbar intervertebral disc     S/P L4-5 & L5-S1 laminotomy, foraminotomy, and L5-S1 diskectomy by Dr. Trey Sailors 11/01/2000.  . Multiple joint pain   . Iron deficiency anemia   . Vitamin D deficiency   . SVT (supraventricular tachycardia)     a. pt says she was told she had afib in the 90's and has been on digoxin since.  . Bilateral leg edema   . Peripheral neuropathy   . Osteopenia   . Seasonal allergic  rhinitis   . Elevated alkaline phosphatase level     Bone scan 08/2004 showed no evidence of abnormal bony activity..  . Skin tag     Right axillary area.  . Hypokalemia   . Headache(784.0)   . Diastolic dysfunction     a. 08/2012 Echo: EF 60-65%, Gr 2 DD, triv MR, PASP .  [name] Left arm numbness     ? 04/2013  . Chest pain     a. reports nl cath in the 1990's.   Past Surgical History  Procedure Laterality Date  . Total abdominal hysterectomy  1992  . Lumbar disc surgery  11/01/2000     L4-5, L5-S1 laminotomy, foraminotomy and L5-S1 diskectomy  . Shoulder arthroscopy  1/172007    S/P right shoulder arthroscopic debridement of a massive rotator cuff tear, greater tuberoplasty and resection of subacromial spur by Dr. Gean Birchwood on 07/07/2005.  [name] Cholecystectomy, laparoscopic  1998  . Cholecystectomy     Family History  Problem Relation Age of Onset  . Ovarian cancer Mother 73  . Hypertension Mother   . Heart attack Mother 54  . Hypertension Sister   . Hypertension Sister   . Breast cancer Neg Hx   . Colon cancer Neg Hx   . Lung cancer Neg Hx   . Cancer Brother   . Neuropathy Sister    History  Substance Use Topics  . Smoking status: Former Smoker -- 0.15 packs/day  for 2 years    Quit date: 10/07/1999  . Smokeless tobacco: Never Used  . Alcohol Use: No   OB History   Grav Para Term Preterm Abortions TAB SAB Ect Mult Living                 Review of Systems  All other systems reviewed and are negative.    Allergies  Penicillins and Sulfonamide derivatives  Home Medications   Current Outpatient Rx  Name  Route  Sig  Dispense  Refill  . aspirin EC 81 MG EC tablet   Oral   Take 1 tablet (81 mg total) by mouth daily.         [name] atenolol (TENORMIN) 50 MG tablet      TAKE 1 TABLET (50 MG TOTAL) BY MOUTH DAILY.   31 tablet   11   . atorvastatin (LIPITOR) 40 MG tablet   Oral   Take 1 tablet (40 mg total) by mouth daily at 6 PM.   30 tablet   0   .  Cholecalciferol (VITAMIN D3) 1000 UNITS CAPS   Oral   Take 3 capsules (3,000 Units total) by mouth daily.   90 capsule   6   . cloNIDine (CATAPRES) 0.1 MG tablet   Oral   Take 1 tablet (0.1 mg total) by mouth daily.   30 tablet   9   . Cyanocobalamin (VITAMIN B-12) 500 MCG SUBL   Sublingual   Place 1 tablet under the tongue daily.          . digoxin (LANOXIN) 0.125 MG tablet   Oral   Take 0.125 mg by mouth daily.         . furosemide (LASIX) 20 MG tablet   Oral   Take 2 tablets (40 mg total) by mouth daily.   180 tablet   2   . meloxicam (MOBIC) 15 MG tablet   Oral   Take 15 mg by mouth daily.         . potassium chloride SA (K-DUR,KLOR-CON) 20 MEQ tablet   Oral   Take 1 tablet (20 mEq total) by mouth daily.   30 tablet   5   . traMADol (ULTRAM) 50 MG tablet   Oral   Take 1 tablet (50 mg total) by mouth every 6 (six) hours as needed.   120 tablet   0    There were no vitals taken for this visit. Physical Exam  Nursing note and vitals reviewed. Constitutional: She appears well-developed and well-nourished. No distress.  Awake, alert, nontoxic appearance  HENT:  Head: Atraumatic.  Eyes: Conjunctivae are normal. Right eye exhibits no discharge. Left eye exhibits no discharge.  Neck: Neck supple.  Cardiovascular: Normal rate and regular rhythm.   Pulmonary/Chest: Effort normal. No respiratory distress. She exhibits no tenderness.  Abdominal: Soft. There is no tenderness. There is no rebound.  Musculoskeletal: She exhibits no tenderness.  ROM appears intact, no obvious focal weakness  Neurological:  Speech clear, pupils equal round reactive to light, extraocular movements intact   Normal peripheral visual fields Cranial nerves III through XII normal including no facial droop Cranial Nerves:  II: Visual fields grossly normal with blinking noted to bilateral confrontation, pupils equal, round, reactive to light and accommodation  III,IV, VI: ptosis not  present, extra-ocular motions intact bilaterally  V,VII: smile symmetric  VIII: hearing normal bilaterally  IX,X: gag reflex present  XI: bilateral shoulder shrug  XII: midline tongue extension Follows commands,  moves all extremities x4, normal strength to bilateral upper and lower extremities at all major muscle groups including grip Sensation with subjective mild paresthesia to L side of face and L forearm as compare to R. Coordination intact, no limb ataxia, finger-nose-finger normal Rapid alternating movements normal No pronator drift Gait mildly antalgic   Skin: No rash noted.  Psychiatric: She has a normal mood and affect.    ED Course  Procedures (including critical care time)  Date: 05/29/2013  Rate: 69  Rhythm: normal sinus rhythm  QRS Axis: normal  Intervals: normal  ST/T Wave abnormalities: normal  Conduction Disutrbances:none  Narrative Interpretation:  Old EKG Reviewed: unchanged   10:45 AM Pt with subjective L side paresthesia. On evaluation pt has no gross focal neuro deficit on exam.  Gait is unsteady but pt does usually walks with a walker.  I have consulted with my attending and with neurologist, Dr. Shaune Pascal, who will see pt.  Work up initiated.  Pt likely benefit from brain MRI for further evaluation.  Initial NIH stroke scale is one.  gave pt ASA.  Pt currently on Xarelto.   2:38 PM Radiologists call to notify that MRI shows evidence of acute stroke (small R thalamic ischemic stroke) . Neurologist Dr. Shaune Pascal is aware. He recommended having medicine to admit patient. My attending Dr. is also aware.  3:15 PM Pt is made aware of MRI finding and agrees to be admitted.  She has no specific complaint at this time.  I have consulted [facility], Dr. Vanessa Barbara who agrees to admit pt to tele, team 10, under his care.    4:23 PM [facility], DR. Vanessa Barbara called to notify this is an Internal Medicine pt, and request IM to admit pt.  I have consulted with  Internal Medicine resident who agrees to see and admit pt.  Pt is currently on [address] [place], room 28.     Labs Review Labs Reviewed  COMPREHENSIVE METABOLIC PANEL - Abnormal; Notable for the following:    Glucose, Bld 111 (*)    Alkaline Phosphatase 144 (*)    All other components within normal limits  GLUCOSE, CAPILLARY - Abnormal; Notable for the following:    Glucose-Capillary 117 (*)    All other components within normal limits  POCT I-STAT, CHEM 8 - Abnormal; Notable for the following:    Glucose, Bld 114 (*)    All other components within normal limits  ETHANOL  PROTIME-INR  APTT  CBC  DIFFERENTIAL  TROPONIN I  URINE RAPID DRUG SCREEN (HOSP PERFORMED)  URINALYSIS, ROUTINE W REFLEX MICROSCOPIC   Imaging Review Ct Head Wo Contrast  05/29/2013   CLINICAL DATA:  Left sided numbness  EXAM: CT HEAD WITHOUT CONTRAST  TECHNIQUE: Contiguous axial images were obtained from the base of the skull through the vertex without intravenous contrast.  COMPARISON:  MRI of the brain dated May 08, 2013.  FINDINGS: There is mild diffuse cerebral and cerebellar atrophy. The ventricles are normal in size and position. There is no intracranial hemorrhage nor intracranial mass effect. Old lacunar infarctions in the region of the caudate nucleus on the left and the anterior limb of the right internal capsule are present. There are no findings to suggest an evolving ischemic infarction. The cerebellum and brainstem are normal in density.  At bone window settings there is no evidence of an acute skull fracture. The observed portions of the paranasal sinuses and mastoid air cells are clear.  IMPRESSION: 1. There is no evidence of  an acute intracranial hemorrhage nor of an evolving ischemic infarction. 2. There are white matter density changes as well as old lacunar infarctions consistent with chronic small vessel ischemia. 3. There is no intracranial mass effect or hydrocephalus. 4. The observed portions of the  paranasal sinuses and mastoid air cells exhibit no inflammatory change.   Electronically Signed   By: David  [place]   On: 05/29/2013 11:46   Mr Maxine Glenn Head Wo Contrast  05/29/2013   CLINICAL DATA:  Few days and numbness and tingling left cheek an ear. Hypertension.  EXAM: MRI HEAD WITHOUT CONTRAST  MRA HEAD WITHOUT CONTRAST  TECHNIQUE: Multiplanar, multiecho pulse sequences of the brain and surrounding structures were obtained without intravenous contrast. Angiographic images of the head were obtained using MRA technique without contrast.  COMPARISON:  05/29/2013 CT.  05/08/2013 MR.  FINDINGS: MRI HEAD FINDINGS  Small acute/ subacute nonhemorrhagic infarct lateral aspect of the right thalamus. Tiny acute/subacute right paracentral pontine infarct may also be present.  Moderate small vessel disease type changes.  Global atrophy without hydrocephalus.  No intracranial hemorrhage.  No intracranial mass lesion noted on this unenhanced exam.  Cervical medullary junction and pineal region unremarkable. Partially empty sella. Minimal exophthalmos. Minimal cervical spondylotic changes C3-4 and C4-5.  MRA HEAD FINDINGS  Anterior circulation without medium or large size vessel significant stenosis or occlusion.  1.8 mm aneurysm right middle cerebral artery bifurcation suspected.  Fetal type contribution to the posterior circulation bilaterally.  Right vertebral artery is dominant with the left vertebral artery small caliber after takeoff of the left posterior inferior cerebellar artery.  Ectatic basilar artery without high-grade stenosis.  Nonvisualized anterior inferior cerebellar arteries.  Slightly bulbous appearance of the basilar tip may be related to the fetal type origin of the posterior cerebral arteries  IMPRESSION: Small acute/ subacute nonhemorrhagic infarct lateral aspect of the right thalamus. Tiny acute/subacute right paracentral pontine infarct may also be present.  Moderate small vessel disease type changes.   Global atrophy without hydrocephalus.  No intracranial hemorrhage.  1.8 mm aneurysm right middle cerebral artery bifurcation suspected. Please see above for additional MR angiogram findings.  These results were called by telephone at the time of interpretation on 05/29/2013 at 2:50 PM to [facility] , who verbally acknowledged these results.   Electronically Signed   By: Bridgett Larsson M.D.   On: 05/29/2013 14:51   Mr Brain Wo Contrast  05/29/2013   CLINICAL DATA:  Few days and numbness and tingling left cheek an ear. Hypertension.  EXAM: MRI HEAD WITHOUT CONTRAST  MRA HEAD WITHOUT CONTRAST  TECHNIQUE: Multiplanar, multiecho pulse sequences of the brain and surrounding structures were obtained without intravenous contrast. Angiographic images of the head were obtained using MRA technique without contrast.  COMPARISON:  05/29/2013 CT.  05/08/2013 MR.  FINDINGS: MRI HEAD FINDINGS  Small acute/ subacute nonhemorrhagic infarct lateral aspect of the right thalamus. Tiny acute/subacute right paracentral pontine infarct may also be present.  Moderate small vessel disease type changes.  Global atrophy without hydrocephalus.  No intracranial hemorrhage.  No intracranial mass lesion noted on this unenhanced exam.  Cervical medullary junction and pineal region unremarkable. Partially empty sella. Minimal exophthalmos. Minimal cervical spondylotic changes C3-4 and C4-5.  MRA HEAD FINDINGS  Anterior circulation without medium or large size vessel significant stenosis or occlusion.  1.8 mm aneurysm right middle cerebral artery bifurcation suspected.  Fetal type contribution to the posterior circulation bilaterally.  Right vertebral artery is dominant with the left vertebral artery  small caliber after takeoff of the left posterior inferior cerebellar artery.  Ectatic basilar artery without high-grade stenosis.  Nonvisualized anterior inferior cerebellar arteries.  Slightly bulbous appearance of the basilar tip may be related to the  fetal type origin of the posterior cerebral arteries  IMPRESSION: Small acute/ subacute nonhemorrhagic infarct lateral aspect of the right thalamus. Tiny acute/subacute right paracentral pontine infarct may also be present.  Moderate small vessel disease type changes.  Global atrophy without hydrocephalus.  No intracranial hemorrhage.  1.8 mm aneurysm right middle cerebral artery bifurcation suspected. Please see above for additional MR angiogram findings.  These results were called by telephone at the time of interpretation on 05/29/2013 at 2:50 PM to [facility] , who verbally acknowledged these results.   Electronically Signed   By: Bridgett Larsson M.D.   On: 05/29/2013 14:51    EKG Interpretation   None       MDM   1. Acute embolic stroke   2. Brain aneurysm    BP 155/71  Pulse 71  Temp(Src) 98.4 F (36.9 C) (Oral)  Resp 26  SpO2 100%  I have reviewed nursing notes and vital signs. I personally reviewed the imaging tests through PACS system  I reviewed available ER/hospitalization records thought the EMR     Fayrene Helper, PA-C 05/29/13 1522  Fayrene Helper, PA-C 05/29/13 1624

## 2013-05-29 NOTE — H&P (Signed)
Date: 05/29/2013               Patient Name:  [name] MRN: [id number]  DOB: 04-12-1941 Age / Sex: 72 y.o., female   PCP: Farley Ly, MD              Medical Service: Internal Medicine Teaching Service              Attending Physician: Dr. Jeralyn Bennett, MD    First Contact: Lewie Chamber, MS 4 Pager: [phone]  Second Contact: Dr. Garald Braver Pager: [phone]  Third Contact Dr.  Dineen Kid: [phone]       After Hours (After 5p/  First Contact Pager: [phone]  weekends / holidays): Second Contact Pager: [phone]   Chief Complaint: left side numbness  History of Present Illness: [name] is a 72 yo caucasian female with significant PMH HTN, HL, and previous CVA 05/08/13 who presents to ED with complaint of left sided numbness. She reports the symptoms are similar to her presentation in November. Her previous admission was significant for left face and arm numbness with MRI showing B/L basal ganglia and thalamic lacunar infarcts. She was evaluated by neurology and cardiology with recommendations for risk factor modification and she was discharged on aspirin and lipitor added to her home medication list. There was a question of whether her previous admission was related to anginal symptoms and cardiology evaluation ruled this out and no further workup was recommended.   Today, she describes a sudden onset of left sided numbness/tingling (face, arm, forearm, hand, thigh, leg, foot). She was hanging up the phone when her symptoms onset and she called her friend who then called EMS and she was brought to the ED. She notes that her numbness/tingling is slightly improved from this morning and that she had some left sided decrease in her hearing as well. Nothing makes her symptoms better or worse and they are constant in nature. She has not tried taking anything for relief of symptoms.   Of note, she takes digoxin at home and has been subtherapeautic in the past, but her CHADS2VASc = 3 on  previous admission and no anticoagulation was recommended by cardiology.     Meds: Current Facility-Administered Medications  Medication Dose Route Frequency Provider Last Rate Last Dose  . 0.9 %  sodium chloride infusion  100 mL/hr Intravenous Continuous Fayrene Helper, PA-C        Allergies: Allergies as of 05/29/2013 - Review Complete 05/29/2013  Allergen Reaction Noted  . Penicillins    . Sulfonamide derivatives     Past Medical History  Diagnosis Date  . Hypertension   . Hyperlipemia   . DJD (degenerative joint disease)   . Chronic low back pain   . Abnormal electromyogram (EMG)     Low grade right S1 radiculopathy 7/93.  [name] Hot flashes   . Rotator cuff syndrome of right shoulder     S/P right shoulder arthroscopic debridement of a massive rotator cuff tear, greater tuberosity and resection of subacromial spur by Dr. Gean Birchwood 07/07/2005.  [name] Herniated lumbar intervertebral disc     S/P L4-5 & L5-S1 laminotomy, foraminotomy, and L5-S1 diskectomy by Dr. Trey Sailors 11/01/2000.  . Multiple joint pain   . Iron deficiency anemia   . Vitamin D deficiency   . SVT (supraventricular tachycardia)     a. pt says she was told she had afib in the 90's and has been on digoxin since.  . Bilateral leg edema   .  Peripheral neuropathy   . Osteopenia   . Seasonal allergic rhinitis   . Elevated alkaline phosphatase level     Bone scan 08/2004 showed no evidence of abnormal bony activity..  . Skin tag     Right axillary area.  . Hypokalemia   . Headache(784.0)   . Diastolic dysfunction     a. 08/2012 Echo: EF 60-65%, Gr 2 DD, triv MR, PASP .  [name] Left arm numbness     ? 04/2013  . Chest pain     a. reports nl cath in the 1990's.   Past Surgical History  Procedure Laterality Date  . Total abdominal hysterectomy  1992  . Lumbar disc surgery  11/01/2000     L4-5, L5-S1 laminotomy, foraminotomy and L5-S1 diskectomy  . Shoulder arthroscopy  1/172007    S/P right shoulder arthroscopic  debridement of a massive rotator cuff tear, greater tuberoplasty and resection of subacromial spur by Dr. Gean Birchwood on 07/07/2005.  [name] Cholecystectomy, laparoscopic  1998  . Cholecystectomy     Family History  Problem Relation Age of Onset  . Ovarian cancer Mother 75  . Hypertension Mother   . Heart attack Mother 16  . Hypertension Sister   . Hypertension Sister   . Breast cancer Neg Hx   . Colon cancer Neg Hx   . Lung cancer Neg Hx   . Cancer Brother   . Neuropathy Sister    History   Social History  . Marital Status: Single    Spouse Name: N/A    Number of Children: N/A  . Years of Education: N/A   Occupational History  . Not on file.   Social History Main Topics  . Smoking status: Former Smoker -- 0.15 packs/day for 2 years    Quit date: 10/07/1999  . Smokeless tobacco: Never Used  . Alcohol Use: No  . Drug Use: No  . Sexual Activity: Not on file   Other Topics Concern  . Not on file   Social History Narrative  . No narrative on file    Review of Systems: Constitutional: positive for none, negative for chills, fevers and sweats Eyes: positive for none, negative for redness and visual disturbance Ears, nose, mouth, throat, and face: positive for decreased hearing, negative for ear drainage, earaches, epistaxis and nasal congestion Respiratory: positive for none, negative for asthma, chronic bronchitis, cough, dyspnea on exertion, emphysema and wheezing Cardiovascular: positive for lower extremity edema, negative for chest pain, chest pressure/discomfort, dyspnea, exertional chest pressure/discomfort, palpitations and syncope Gastrointestinal: positive for none, negative for abdominal pain, change in bowel habits, constipation and diarrhea Musculoskeletal:positive for none, negative for arthralgias, back pain, muscle weakness Neurological: positive for paresthesia, negative for coordination problems, dizziness, gait problems, headaches, speech problems and  weakness Behavioral/Psych: positive for none, negative for anxiety, decreased appetite and fatigue Endocrine: positive for none, negative for temperature intolerance  Physical Exam: Blood pressure 156/69, pulse 73, temperature 99 F (37.2 C), temperature source Oral, resp. rate 18, height 5\' 9"  (1.753 m), weight 102.105 kg (225 lb 1.6 oz), SpO2 98.00%. BP 156/69  Pulse 73  Temp(Src) 99 F (37.2 C) (Oral)  Resp 18  Ht 5\' 9"  (1.753 m)  Wt 102.105 kg (225 lb 1.6 oz)  BMI 33.23 kg/m2  SpO2 98% General appearance: alert, cooperative and no distress Head: Normocephalic, without obvious abnormality, atraumatic Eyes: conjunctiva/cornea clear, EOMI, PERRL Throat: lips, mucosa, and tongue normal; teeth and gums normal Neck: no adenopathy, no carotid bruit, no JVD and  supple, symmetrical, trachea midline Back: symmetric, no curvature. ROM normal. No CVA tenderness. Lungs: clear to auscultation bilaterally Heart: regular rate and rhythm, S1, S2 normal, no murmur, click, rub or gallop Abdomen: soft, non-tender; bowel sounds normal; no masses,  no organomegaly Extremities: edema 1+ B/L LE edema Pulses: 2+ and symmetric Skin: Skin color, texture, turgor normal. No rashes or lesions Neurologic: Mental status: Alert, oriented, thought content appropriate Cranial nerves:  I: smell Not tested     II: visual fields Full to confrontation  II: pupils Equal, round, reactive to light  III,VII: ptosis None  III,IV,VI: extraocular muscles  Full ROM  V: mastication Normal  V: facial light touch sensation  Normal  V,VII: corneal reflex  Present  VII: facial muscle function - upper  Normal  VII: facial muscle function - lower Normal  VIII: hearing Decreased to finger rub Left ear  IX: soft palate elevation  Normal  IX,X: gag reflex Not tested  XI: trapezius strength  5/5  XI: sternocleidomastoid strength 5/5  XI: neck flexion strength  5/5  XII: tongue strength  Normal   Sensory: decreased to  sensation Left face (V2,V3), left arm/forearm/hand, left thigh/leg/dorsal surface foot Motor: grossly normal Coordination: normal  Lab results: Basic Metabolic Panel:  Recent Labs  81/19/14 1143 05/29/13 1155  NA 140 143  K 4.0 3.6  CL 104 103  CO2 28  --   GLUCOSE 111* 114*  BUN 12 11  CREATININE 0.55 0.70  CALCIUM 9.5  --    Liver Function Tests:  Recent Labs  05/29/13 1143  AST 13  ALT 10  ALKPHOS 144*  BILITOT 0.9  PROT 6.7  ALBUMIN 3.8   No results found for this basename: LIPASE, AMYLASE,  in the last 72 hours No results found for this basename: AMMONIA,  in the last 72 hours CBC:  Recent Labs  05/29/13 1143 05/29/13 1155  WBC 5.1  --   NEUTROABS 3.1  --   HGB 12.4 12.9  HCT 37.7 38.0  MCV 85.5  --   PLT 167  --    Cardiac Enzymes:  Recent Labs  05/29/13 1144  TROPONINI <0.30   BNP: No results found for this basename: PROBNP,  in the last 72 hours D-Dimer: No results found for this basename: DDIMER,  in the last 72 hours CBG:  Recent Labs  05/29/13 1123  GLUCAP 117*   Hemoglobin A1C: No results found for this basename: HGBA1C,  in the last 72 hours Fasting Lipid Panel: No results found for this basename: CHOL, HDL, LDLCALC, TRIG, CHOLHDL, LDLDIRECT,  in the last 72 hours Thyroid Function Tests: No results found for this basename: TSH, T4TOTAL, FREET4, T3FREE, THYROIDAB,  in the last 72 hours Anemia Panel: No results found for this basename: VITAMINB12, FOLATE, FERRITIN, TIBC, IRON, RETICCTPCT,  in the last 72 hours Coagulation:  Recent Labs  05/29/13 1143  LABPROT 13.5  INR 1.05   Urine Drug Screen: Drugs of Abuse     Component Value Date/Time   LABOPIA NONE DETECTED 05/29/2013 1159   COCAINSCRNUR NONE DETECTED 05/29/2013 1159   LABBENZ NONE DETECTED 05/29/2013 1159   AMPHETMU NONE DETECTED 05/29/2013 1159   THCU NONE DETECTED 05/29/2013 1159   LABBARB NONE DETECTED 05/29/2013 1159    Alcohol Level:  Recent Labs   05/29/13 1143  ETH <11   Urinalysis:  Recent Labs  05/29/13 1159  COLORURINE YELLOW  LABSPEC 1.014  PHURINE 6.5  GLUCOSEU NEGATIVE  HGBUR NEGATIVE  BILIRUBINUR  NEGATIVE  KETONESUR NEGATIVE  PROTEINUR NEGATIVE  UROBILINOGEN 1.0  NITRITE NEGATIVE  LEUKOCYTESUR NEGATIVE    Imaging results:  Ct Head Wo Contrast  05/29/2013   CLINICAL DATA:  Left sided numbness  EXAM: CT HEAD WITHOUT CONTRAST  TECHNIQUE: Contiguous axial images were obtained from the base of the skull through the vertex without intravenous contrast.  COMPARISON:  MRI of the brain dated May 08, 2013.  FINDINGS: There is mild diffuse cerebral and cerebellar atrophy. The ventricles are normal in size and position. There is no intracranial hemorrhage nor intracranial mass effect. Old lacunar infarctions in the region of the caudate nucleus on the left and the anterior limb of the right internal capsule are present. There are no findings to suggest an evolving ischemic infarction. The cerebellum and brainstem are normal in density.  At bone window settings there is no evidence of an acute skull fracture. The observed portions of the paranasal sinuses and mastoid air cells are clear.  IMPRESSION: 1. There is no evidence of an acute intracranial hemorrhage nor of an evolving ischemic infarction. 2. There are white matter density changes as well as old lacunar infarctions consistent with chronic small vessel ischemia. 3. There is no intracranial mass effect or hydrocephalus. 4. The observed portions of the paranasal sinuses and mastoid air cells exhibit no inflammatory change.   Electronically Signed   By: Charlei Ramsaran  [place]   On: 05/29/2013 11:46   Mr Maxine Glenn Head Wo Contrast  05/29/2013   CLINICAL DATA:  Few days and numbness and tingling left cheek an ear. Hypertension.  EXAM: MRI HEAD WITHOUT CONTRAST  MRA HEAD WITHOUT CONTRAST  TECHNIQUE: Multiplanar, multiecho pulse sequences of the brain and surrounding structures were obtained  without intravenous contrast. Angiographic images of the head were obtained using MRA technique without contrast.  COMPARISON:  05/29/2013 CT.  05/08/2013 MR.  FINDINGS: MRI HEAD FINDINGS  Small acute/ subacute nonhemorrhagic infarct lateral aspect of the right thalamus. Tiny acute/subacute right paracentral pontine infarct may also be present.  Moderate small vessel disease type changes.  Global atrophy without hydrocephalus.  No intracranial hemorrhage.  No intracranial mass lesion noted on this unenhanced exam.  Cervical medullary junction and pineal region unremarkable. Partially empty sella. Minimal exophthalmos. Minimal cervical spondylotic changes C3-4 and C4-5.  MRA HEAD FINDINGS  Anterior circulation without medium or large size vessel significant stenosis or occlusion.  1.8 mm aneurysm right middle cerebral artery bifurcation suspected.  Fetal type contribution to the posterior circulation bilaterally.  Right vertebral artery is dominant with the left vertebral artery small caliber after takeoff of the left posterior inferior cerebellar artery.  Ectatic basilar artery without high-grade stenosis.  Nonvisualized anterior inferior cerebellar arteries.  Slightly bulbous appearance of the basilar tip may be related to the fetal type origin of the posterior cerebral arteries  IMPRESSION: Small acute/ subacute nonhemorrhagic infarct lateral aspect of the right thalamus. Tiny acute/subacute right paracentral pontine infarct may also be present.  Moderate small vessel disease type changes.  Global atrophy without hydrocephalus.  No intracranial hemorrhage.  1.8 mm aneurysm right middle cerebral artery bifurcation suspected. Please see above for additional MR angiogram findings.  These results were called by telephone at the time of interpretation on 05/29/2013 at 2:50 PM to [facility] , who verbally acknowledged these results.   Electronically Signed   By: Bridgett Larsson M.D.   On: 05/29/2013 14:51   Mr Brain Wo  Contrast  05/29/2013   CLINICAL DATA:  Few days and  numbness and tingling left cheek an ear. Hypertension.  EXAM: MRI HEAD WITHOUT CONTRAST  MRA HEAD WITHOUT CONTRAST  TECHNIQUE: Multiplanar, multiecho pulse sequences of the brain and surrounding structures were obtained without intravenous contrast. Angiographic images of the head were obtained using MRA technique without contrast.  COMPARISON:  05/29/2013 CT.  05/08/2013 MR.  FINDINGS: MRI HEAD FINDINGS  Small acute/ subacute nonhemorrhagic infarct lateral aspect of the right thalamus. Tiny acute/subacute right paracentral pontine infarct may also be present.  Moderate small vessel disease type changes.  Global atrophy without hydrocephalus.  No intracranial hemorrhage.  No intracranial mass lesion noted on this unenhanced exam.  Cervical medullary junction and pineal region unremarkable. Partially empty sella. Minimal exophthalmos. Minimal cervical spondylotic changes C3-4 and C4-5.  MRA HEAD FINDINGS  Anterior circulation without medium or large size vessel significant stenosis or occlusion.  1.8 mm aneurysm right middle cerebral artery bifurcation suspected.  Fetal type contribution to the posterior circulation bilaterally.  Right vertebral artery is dominant with the left vertebral artery small caliber after takeoff of the left posterior inferior cerebellar artery.  Ectatic basilar artery without high-grade stenosis.  Nonvisualized anterior inferior cerebellar arteries.  Slightly bulbous appearance of the basilar tip may be related to the fetal type origin of the posterior cerebral arteries  IMPRESSION: Small acute/ subacute nonhemorrhagic infarct lateral aspect of the right thalamus. Tiny acute/subacute right paracentral pontine infarct may also be present.  Moderate small vessel disease type changes.  Global atrophy without hydrocephalus.  No intracranial hemorrhage.  1.8 mm aneurysm right middle cerebral artery bifurcation suspected. Please see above for  additional MR angiogram findings.  These results were called by telephone at the time of interpretation on 05/29/2013 at 2:50 PM to [facility] , who verbally acknowledged these results.   Electronically Signed   By: Bridgett Larsson M.D.   On: 05/29/2013 14:51    Other results: EKG: No Current EKG in ED Previous EKG 11/18: Normal sinus rhythm, LAD  Assessment & Plan by Problem: Active Problems:   HYPERLIPIDEMIA   HYPERTENSION   LEG EDEMA, BILATERAL   Acute ischemic stroke   CVA (cerebral infarction)  Assessment: 72 yo woman with PMH prior CVA, HTN, HL admitted with left sided numbness and MRI reveals small acute/ subacute nonhemorrhagic infarcts in Right thalamus with moderate small vessel disease changes consistent with acute ischemic stroke.  #Acute ischemic stroke Patient previously admitted 05/08/13 for B/L basal ganglia and thalamic lacunar infarcts and underwent stroke workup. She was started on aspirin and lipitor for secondary prevention. She did not undergo an echo in November since she had a previous echo in March 2014. Patient now presents with new onset left sided numbness affecting more areas than previously (LE now affected). Differential diagnosis includes TIA vs new ischemic stroke (CHADS2VASc = 5) likely related to small vessel disease as previous stroke workup negative for carotid disease and no further workup from a cardiology perspective. Since her last echo was in March and she has a repeat stroke now, will pursue repeating echo to r/o any cardiac involvement, especially with her increased CHADS2VASc score now (3>>5). Her previous workup consisted of negative carotid dopplers, aspirin therapy, A1C 5.3 (no DM), dyslipidemia (lipitor 40mg  started, previously on pravastatin), and folate/B12 WNL. - neurology consult, appreciate recommendations - neuro checks - EKG today - tele monitoring - 2D echo with bubble study - switch from asa to plavix 75mg  daily for secondary stroke  prevention - continue lipitor 40 mg daily - continue digoxin  0.125mg  daily  - will consult cardiology in am for ?PAF history and digoxin use as per previous admission and starting anticoagulation  #HTN History of HTN with compliance of her medications at home. Will hold anti-hypertensives for 24 hours to allow for permissive HTN. - hold atenolol 50mg  daily - hold lasix 40mg  daily - hold clonidine 0.1 mg daily  #Hyperlipidemia Lipid panel revealed elevated LDL (132) on previous admission and she was changed from pravastatin to lipitor on discharge - continue lipitor 40 mg daily   This is a Psychologist, occupational Note.  The care of the patient was discussed with Dr. Sara Chu and the assessment and plan was formulated with their assistance.  Please see their note for official documentation of the patient encounter.   Signed: Lewie Chamber, Med Student 05/29/2013, 6:03 PM

## 2013-05-29 NOTE — ED Notes (Signed)
Pt arrived from home alone by [facility] with c/o left sided face numbness and tingling that radiates behind ear. Pt also has left hand numbness. Hx of "mini stroke" 4 weeks ago with same symptoms. Denies any pain. Pt also has left leg swelling that is normal for her. Stated that she woke up around 0715 this morning and felt her normal self. Stated that around 0830 she started feeling dizzy and along with the symptoms. Afib on monitor with hx. EMS stroke scale negative.

## 2013-05-29 NOTE — H&P (Signed)
Date: 05/29/2013               Patient Name:  [name] MRN: [id number]  DOB: 12-23-1940 Age / Sex: 72 y.o., female   PCP: Farley Ly, MD         Medical Service: Internal Medicine Teaching Service         Attending Physician: Dr. Aletta Edouard, MD    First Contact: Lewie Chamber, MS 4  Pager: [phone]    Second Contact: Dr. Garald Braver Pager: [phone]       After Hours (After 5p/  First Contact Pager: [phone]  weekends / holidays): Second Contact Pager: [phone]   Chief Complaint: left side numbness   History of Present Illness:  Ms. Ashenfelter is a 72 year old woman with PMH significant for HTN, hyperlipidemia, and previous CVA 05/08/13 who presents to ED with complaint of left sided numbness. She reports her symptoms are similar to her presentation in November when she was admitted for evaluation of significant left face and left arm numbness with MRI showing bilateral basal ganglia and thalamic lacunar infarcts. She was evaluated by Neurology with recommendations for risk factor modification and was started on aspirin and lipitor for secondary CVA prevention. She was also evaluated by Cardiology during that admission for chest pain but was found to have no acute coronary syndrome.   Her present symptoms started today, suddenly while she hung up the phone after a routine phone call. The symptoms comprised of left sided numbness/tingling (face, arm, forearm, hand, thigh, leg, foot). She immediately called a friend who called EMS and she was brought to the ED. She notes that her numbness/tingling is slightly improved from this morning thought she notes a slight decrease in her hearing.   Of note, she takes digoxin at home for presumed A. Fib and has been subtherapeautic in the past, but her CHADS2VASc = 3 on previous admission and no anticoagulation was recommended by Cardiology.   She denies confusion, headache, chest pain, shortness of breath, abdominal pain, dysuria, nausea,  vomiting, or diarrhea.    Meds: Medications Prior to Admission  Medication Sig Dispense Refill  . aspirin EC 81 MG EC tablet Take 1 tablet (81 mg total) by mouth daily.      [name] atenolol (TENORMIN) 50 MG tablet Take 50 mg by mouth daily.      [name] atorvastatin (LIPITOR) 40 MG tablet Take 1 tablet (40 mg total) by mouth daily at 6 PM.  30 tablet  0  . Cholecalciferol (VITAMIN D3) 1000 UNITS CAPS Take 3 capsules (3,000 Units total) by mouth daily.  90 capsule  6  . cloNIDine (CATAPRES) 0.1 MG tablet Take 1 tablet (0.1 mg total) by mouth daily.  30 tablet  9  . Cyanocobalamin (VITAMIN B-12) 500 MCG SUBL Place 1 tablet under the tongue daily.       . digoxin (LANOXIN) 0.125 MG tablet Take 0.125 mg by mouth daily.      . furosemide (LASIX) 20 MG tablet Take 2 tablets (40 mg total) by mouth daily.  180 tablet  2  . meloxicam (MOBIC) 15 MG tablet Take 15 mg by mouth daily.      . potassium chloride SA (K-DUR,KLOR-CON) 20 MEQ tablet Take 1 tablet (20 mEq total) by mouth daily.  30 tablet  5  . traMADol (ULTRAM) 50 MG tablet Take 1 tablet (50 mg total) by mouth every 6 (six) hours as needed.  120 tablet  0  Allergies: Allergies as of 05/29/2013 - Review Complete 05/29/2013  Allergen Reaction Noted  . Penicillins    . Sulfonamide derivatives     Past Medical History  Diagnosis Date  . Hypertension   . Hyperlipemia   . DJD (degenerative joint disease)   . Chronic low back pain   . Abnormal electromyogram (EMG)     Low grade right S1 radiculopathy 7/93.  [name] Hot flashes   . Rotator cuff syndrome of right shoulder     S/P right shoulder arthroscopic debridement of a massive rotator cuff tear, greater tuberosity and resection of subacromial spur by Dr. Gean Birchwood 07/07/2005.  [name] Herniated lumbar intervertebral disc     S/P L4-5 & L5-S1 laminotomy, foraminotomy, and L5-S1 diskectomy by Dr. Trey Sailors 11/01/2000.  . Multiple joint pain   . Iron deficiency anemia   . Vitamin D deficiency   . SVT  (supraventricular tachycardia)     a. pt says she was told she had afib in the 90's and has been on digoxin since.  . Bilateral leg edema   . Peripheral neuropathy   . Osteopenia   . Seasonal allergic rhinitis   . Elevated alkaline phosphatase level     Bone scan 08/2004 showed no evidence of abnormal bony activity..  . Skin tag     Right axillary area.  . Hypokalemia   . Headache(784.0)   . Diastolic dysfunction     a. 08/2012 Echo: EF 60-65%, Gr 2 DD, triv MR, PASP .  [name] Left arm numbness     ? 04/2013  . Chest pain     a. reports nl cath in the 1990's.   Past Surgical History  Procedure Laterality Date  . Total abdominal hysterectomy  1992  . Lumbar disc surgery  11/01/2000     L4-5, L5-S1 laminotomy, foraminotomy and L5-S1 diskectomy  . Shoulder arthroscopy  1/172007    S/P right shoulder arthroscopic debridement of a massive rotator cuff tear, greater tuberoplasty and resection of subacromial spur by Dr. Gean Birchwood on 07/07/2005.  [name] Cholecystectomy, laparoscopic  1998  . Cholecystectomy     Family History  Problem Relation Age of Onset  . Ovarian cancer Mother 20  . Hypertension Mother   . Heart attack Mother 26  . Hypertension Sister   . Hypertension Sister   . Breast cancer Neg Hx   . Colon cancer Neg Hx   . Lung cancer Neg Hx   . Cancer Brother   . Neuropathy Sister    History   Social History  . Marital Status: Single    Spouse Name: N/A    Number of Children: N/A  . Years of Education: N/A   Occupational History  . Not on file.   Social History Main Topics  . Smoking status: Former Smoker -- 0.15 packs/day for 2 years    Quit date: 10/07/1999  . Smokeless tobacco: Never Used  . Alcohol Use: No  . Drug Use: No  . Sexual Activity: Not on file   Other Topics Concern  . Not on file   Social History Narrative  . No narrative on file    Review of Systems: A 10 point comprehensive review of systems was negative except as noted in the  HPI.  Physical Exam: Blood pressure 156/69, pulse 73, temperature 99 F (37.2 C), temperature source Oral, resp. rate 18, height 5\' 9"  (1.753 m), weight 225 lb 1.6 oz (102.105 kg), SpO2 98.00%. BP 156/69  Pulse 73  Temp(Src) 99 F (37.2 C) (Oral)  Resp 18  Ht 5\' 9"  (1.753 m)  Wt 225 lb 1.6 oz (102.105 kg)  BMI 33.23 kg/m2  SpO2 98%  General Appearance:    Alert, cooperative, no distress, appears stated age  Head:    Normocephalic, without obvious abnormality, atraumatic  Eyes:    PERRL, conjunctiva/corneas clear, EOM's intact, both eyes  Ears:    Decreased finger rub test left ear  Nose:   Nares normal, septum midline, mucosa normal, no drainage    or sinus tenderness  Throat:   Lips, mucosa, and tongue normal; teeth and gums normal  Neck:   Supple, symmetrical, trachea midline, no adenopathy;    thyroid:  no enlargement/tenderness/nodules  Back:     Symmetric, no curvature, ROM normal, no CVA tenderness  Lungs:     Clear to auscultation bilaterally, respirations unlabored  Chest Wall:    No tenderness or deformity   Heart:    Regular rate and rhythm, S1 and S2 normal, soft SEM heard best at the RUSB but heard throughout the precordium, rub or gallop     Abdomen:     Soft, non-tender, bowel sounds active all four quadrants,    no masses, no organomegaly        Extremities:   Extremities normal, atraumatic, no cyanosis, trace pitting edema bilaterally up to her knees  Pulses:   DP 1+ and symmetric, radial pulses 2+ and symmetric  Skin:   Skin color, texture, turgor normal, no rashes or lesions     Neurologic:   Alert and Oriented X3, CNII-XII intact, normal strength in UE and LE, decreased sensation of left face, left arm, and left leg to the dorsum of her foot. Normal finger-to-nose test. Normal heel-to-shin test.     Lab results: Basic Metabolic Panel:  Recent Labs  16/10/96 1143 05/29/13 1155  NA 140 143  K 4.0 3.6  CL 104 103  CO2 28  --   GLUCOSE 111* 114*  BUN  12 11  CREATININE 0.55 0.70  CALCIUM 9.5  --    Liver Function Tests:  Recent Labs  05/29/13 1143  AST 13  ALT 10  ALKPHOS 144*  BILITOT 0.9  PROT 6.7  ALBUMIN 3.8    CBC:  Recent Labs  05/29/13 1143 05/29/13 1155  WBC 5.1  --   NEUTROABS 3.1  --   HGB 12.4 12.9  HCT 37.7 38.0  MCV 85.5  --   PLT 167  --    Cardiac Enzymes:  Recent Labs  05/29/13 1144  TROPONINI <0.30   CBG:  Recent Labs  05/29/13 1123  GLUCAP 117*   Coagulation:  Recent Labs  05/29/13 1143  LABPROT 13.5  INR 1.05   Urine Drug Screen: Drugs of Abuse     Component Value Date/Time   LABOPIA NONE DETECTED 05/29/2013 1159   COCAINSCRNUR NONE DETECTED 05/29/2013 1159   LABBENZ NONE DETECTED 05/29/2013 1159   AMPHETMU NONE DETECTED 05/29/2013 1159   THCU NONE DETECTED 05/29/2013 1159   LABBARB NONE DETECTED 05/29/2013 1159    Alcohol Level:  Recent Labs  05/29/13 1143  ETH <11   Urinalysis:  Recent Labs  05/29/13 1159  COLORURINE YELLOW  LABSPEC 1.014  PHURINE 6.5  GLUCOSEU NEGATIVE  HGBUR NEGATIVE  BILIRUBINUR NEGATIVE  KETONESUR NEGATIVE  PROTEINUR NEGATIVE  UROBILINOGEN 1.0  NITRITE NEGATIVE  LEUKOCYTESUR NEGATIVE    Imaging results:  Ct Head Wo Contrast  05/29/2013   CLINICAL DATA:  Left sided numbness  EXAM: CT HEAD WITHOUT CONTRAST  TECHNIQUE: Contiguous axial images were obtained from the base of the skull through the vertex without intravenous contrast.  COMPARISON:  MRI of the brain dated May 08, 2013.  FINDINGS: There is mild diffuse cerebral and cerebellar atrophy. The ventricles are normal in size and position. There is no intracranial hemorrhage nor intracranial mass effect. Old lacunar infarctions in the region of the caudate nucleus on the left and the anterior limb of the right internal capsule are present. There are no findings to suggest an evolving ischemic infarction. The cerebellum and brainstem are normal in density.  At bone window settings  there is no evidence of an acute skull fracture. The observed portions of the paranasal sinuses and mastoid air cells are clear.  IMPRESSION: 1. There is no evidence of an acute intracranial hemorrhage nor of an evolving ischemic infarction. 2. There are white matter density changes as well as old lacunar infarctions consistent with chronic small vessel ischemia. 3. There is no intracranial mass effect or hydrocephalus. 4. The observed portions of the paranasal sinuses and mastoid air cells exhibit no inflammatory change.   Electronically Signed   By: David  [place]   On: 05/29/2013 11:46   Mr Maxine Glenn Head Wo Contrast  05/29/2013   CLINICAL DATA:  Few days and numbness and tingling left cheek an ear. Hypertension.  EXAM: MRI HEAD WITHOUT CONTRAST  MRA HEAD WITHOUT CONTRAST  TECHNIQUE: Multiplanar, multiecho pulse sequences of the brain and surrounding structures were obtained without intravenous contrast. Angiographic images of the head were obtained using MRA technique without contrast.  COMPARISON:  05/29/2013 CT.  05/08/2013 MR.  FINDINGS: MRI HEAD FINDINGS  Small acute/ subacute nonhemorrhagic infarct lateral aspect of the right thalamus. Tiny acute/subacute right paracentral pontine infarct may also be present.  Moderate small vessel disease type changes.  Global atrophy without hydrocephalus.  No intracranial hemorrhage.  No intracranial mass lesion noted on this unenhanced exam.  Cervical medullary junction and pineal region unremarkable. Partially empty sella. Minimal exophthalmos. Minimal cervical spondylotic changes C3-4 and C4-5.  MRA HEAD FINDINGS  Anterior circulation without medium or large size vessel significant stenosis or occlusion.  1.8 mm aneurysm right middle cerebral artery bifurcation suspected.  Fetal type contribution to the posterior circulation bilaterally.  Right vertebral artery is dominant with the left vertebral artery small caliber after takeoff of the left posterior inferior cerebellar  artery.  Ectatic basilar artery without high-grade stenosis.  Nonvisualized anterior inferior cerebellar arteries.  Slightly bulbous appearance of the basilar tip may be related to the fetal type origin of the posterior cerebral arteries  IMPRESSION: Small acute/ subacute nonhemorrhagic infarct lateral aspect of the right thalamus. Tiny acute/subacute right paracentral pontine infarct may also be present.  Moderate small vessel disease type changes.  Global atrophy without hydrocephalus.  No intracranial hemorrhage.  1.8 mm aneurysm right middle cerebral artery bifurcation suspected. Please see above for additional MR angiogram findings.  These results were called by telephone at the time of interpretation on 05/29/2013 at 2:50 PM to [facility] , who verbally acknowledged these results.   Electronically Signed   By: Bridgett Larsson M.D.   On: 05/29/2013 14:51   Mr Brain Wo Contrast  05/29/2013   CLINICAL DATA:  Few days and numbness and tingling left cheek an ear. Hypertension.  EXAM: MRI HEAD WITHOUT CONTRAST  MRA HEAD WITHOUT CONTRAST  TECHNIQUE: Multiplanar, multiecho pulse sequences of the brain and surrounding structures were obtained  without intravenous contrast. Angiographic images of the head were obtained using MRA technique without contrast.  COMPARISON:  05/29/2013 CT.  05/08/2013 MR.  FINDINGS: MRI HEAD FINDINGS  Small acute/ subacute nonhemorrhagic infarct lateral aspect of the right thalamus. Tiny acute/subacute right paracentral pontine infarct may also be present.  Moderate small vessel disease type changes.  Global atrophy without hydrocephalus.  No intracranial hemorrhage.  No intracranial mass lesion noted on this unenhanced exam.  Cervical medullary junction and pineal region unremarkable. Partially empty sella. Minimal exophthalmos. Minimal cervical spondylotic changes C3-4 and C4-5.  MRA HEAD FINDINGS  Anterior circulation without medium or large size vessel significant stenosis or occlusion.   1.8 mm aneurysm right middle cerebral artery bifurcation suspected.  Fetal type contribution to the posterior circulation bilaterally.  Right vertebral artery is dominant with the left vertebral artery small caliber after takeoff of the left posterior inferior cerebellar artery.  Ectatic basilar artery without high-grade stenosis.  Nonvisualized anterior inferior cerebellar arteries.  Slightly bulbous appearance of the basilar tip may be related to the fetal type origin of the posterior cerebral arteries  IMPRESSION: Small acute/ subacute nonhemorrhagic infarct lateral aspect of the right thalamus. Tiny acute/subacute right paracentral pontine infarct may also be present.  Moderate small vessel disease type changes.  Global atrophy without hydrocephalus.  No intracranial hemorrhage.  1.8 mm aneurysm right middle cerebral artery bifurcation suspected. Please see above for additional MR angiogram findings.  These results were called by telephone at the time of interpretation on 05/29/2013 at 2:50 PM to [facility] , who verbally acknowledged these results.   Electronically Signed   By: Bridgett Larsson M.D.   On: 05/29/2013 14:51    Other results: EKG: Pending  Assessment & Plan by Problem: Ms. Welshans is a 72 year old woman with PMH of CVA, HTN, hyperlipemia admitted with left sided numbness and MRI significant for small acute/ subacute nonhemorrhagic infarcts in Right thalamus with moderate small vessel disease changes consistent with acute ischemic stroke.   Acute ischemic stroke  She was previously admitted on 05/08/13 for bilateral basal ganglia and thalamic lacunar infarcts and underwent stroke workup. She was started on aspirin and lipitor for secondary prevention. She did not undergo an echo in November since she had a previous echo in March 2014. She now presents with new onset left sided numbness affecting more areas than as previously (LE now affected). Differential diagnosis includes TIA vs new  ischemic stroke (CHADS2VASc = 5) likely related to small vessel disease as previous stroke workup negative for carotid artery disease and no further workup from a Cardiology was found to be indicated.  - admit to telemetry - neurology consult, appreciate recommendations  - frequent neuro checks  - EKG today  - 2D echo  - switch from asa to plavix 75mg  daily for secondary stroke prevention (per Neurology) - continue lipitor 40 mg daily  - continue digoxin 0.125mg  daily  - will consult cardiology in am for history of paroxysmal A. Fib history and digoxin use and recommendation for starting anticoagulation   HTN - Will hold home anti-hypertensives for 24 hours to allow for permissive HTN in the setting of acute stroke.  - hold atenolol 50mg  daily  - hold lasix 40mg  daily  - hold clonidine 0.1 mg daily   Hyperlipidemia- LDL (132) on 05/09/13, during her previous admission and she was changed from pravastatin to lipitor on discharge.  - continue lipitor 40 mg daily  VTE prophylaxis: Heparin SQ TID  Dispo: Disposition is deferred at this time, awaiting improvement of current medical problems.    The patient does have a current PCP Farley Ly, MD) and does need an [facility] hospital follow-up appointment after discharge.  The patient does not have transportation limitations that hinder transportation to clinic appointments.  Signed: Ky Barban, MD 05/29/2013, 9:03 PM

## 2013-05-30 DIAGNOSIS — G459 Transient cerebral ischemic attack, unspecified: Secondary | ICD-10-CM

## 2013-05-30 LAB — BASIC METABOLIC PANEL
BUN: 8 mg/dL (ref 6–23)
CO2: 25 mEq/L (ref 19–32)
Chloride: 106 mEq/L (ref 96–112)
GFR calc non Af Amer: >90 mL/min (ref >90)
Glucose, Bld: 120 mg/dL — ABNORMAL HIGH (ref 70–99)
Potassium: 3.4 mEq/L — ABNORMAL LOW (ref 3.5–5.1)
Sodium: 139 mEq/L (ref 135–145)

## 2013-05-30 LAB — CBC
HCT: 35.9 % — ABNORMAL LOW (ref 36.0–46.0)
Hemoglobin: 12 g/dL (ref 12.0–15.0)
MCH: 28 pg (ref 26.0–34.0)
MCV: 83.7 fL (ref 78.0–100.0)
Platelets: 164 10*3/uL (ref 150–400)
RBC: 4.29 MIL/uL (ref 3.87–5.11)

## 2013-05-30 MED ORDER — CLOPIDOGREL BISULFATE 75 MG PO TABS: 75.0000 mg | ORAL_TABLET | Freq: Every day | ORAL | Status: DC

## 2013-05-30 NOTE — Progress Notes (Signed)
Stroke Team Progress Note  HISTORY [name] is an 72 y.o. female, right handed, with a past medical history significant for HTN, hyperlipidemia, DJD, chronic low back pain, peripheral neuropathy, comes in 05/29/2013 for further evaluation of left face and left hand numbness, dull hearing.    She was admitted to [facility] couple of weeks ago with the same symptomatology and had extensive cerebrovascular work up that was unrevealing. [name] stated that when she left the hospital she was still having " some numbness and tingling in my left face, mainly around the left cheek and ear as well as my left hand, it never went completely away", but when she woke up around 8 am today the numbness was " stronger" and she also became very lightheaded when she stood up. Additionally, she noticed that her hearing was " dull'. She said that she called her sister on the phone and decided to come to the ED.  Denies associated HA, vertigo, double vision, difficulty swallowing, focal weakness, imbalance, slurred speech, language or visual impairment. Takes aspirin 81 mg daily.  CT brain today unrevealing. She was not a tPA candidate due to mild symptoms. She was admitted for further evaluation.   SUBJECTIVE No family at bedside.   OBJECTIVE Most recent Vital Signs: Filed Vitals:   05/30/13 0300 05/30/13 0500 05/30/13 0703 05/30/13 0959  BP: 145/80 165/98 168/81 140/84  Pulse: 74 77  76  Temp: 98 F (36.7 C) 98.3 F (36.8 C)  97.8 F (36.6 C)  TempSrc: Oral Oral  Oral  Resp: 20 18  18   Height:      Weight:      SpO2: 99% 99%  100%   CBG (last 3)   Recent Labs  05/29/13 1123  GLUCAP 117*    IV Fluid Intake:     MEDICATIONS  . atorvastatin  40 mg Oral q1800  . clopidogrel  75 mg Oral Q breakfast  . digoxin  0.125 mg Oral Daily  . heparin  5,000 Units Subcutaneous Q8H  . sodium chloride  3 mL Intravenous Q12H   PRN:  acetaminophen, acetaminophen, ondansetron (ZOFRAN) IV, ondansetron  Diet:   Carb Control thin liquids Activity:  As tolerated DVT Prophylaxis:  Heparin 5000 units sq tid   CLINICALLY SIGNIFICANT STUDIES Basic Metabolic Panel:   Recent Labs Lab 05/29/13 1143 05/29/13 1155 05/30/13 0620  NA 140 143 139  K 4.0 3.6 3.4*  CL 104 103 106  CO2 28  --  25  GLUCOSE 111* 114* 120*  BUN 12 11 8   CREATININE 0.55 0.70 0.52  CALCIUM 9.5  --  9.0   Liver Function Tests:   Recent Labs Lab 05/29/13 1143  AST 13  ALT 10  ALKPHOS 144*  BILITOT 0.9  PROT 6.7  ALBUMIN 3.8   CBC:   Recent Labs Lab 05/29/13 1143 05/29/13 1155 05/30/13 0620  WBC 5.1  --  4.9  NEUTROABS 3.1  --   --   HGB 12.4 12.9 12.0  HCT 37.7 38.0 35.9*  MCV 85.5  --  83.7  PLT 167  --  164   Coagulation:   Recent Labs Lab 05/29/13 1143  LABPROT 13.5  INR 1.05   Cardiac Enzymes:   Recent Labs Lab 05/29/13 1144  TROPONINI <0.30   Urinalysis:   Recent Labs Lab 05/29/13 1159  COLORURINE YELLOW  LABSPEC 1.014  PHURINE 6.5  GLUCOSEU NEGATIVE  HGBUR NEGATIVE  BILIRUBINUR NEGATIVE  KETONESUR NEGATIVE  PROTEINUR NEGATIVE  UROBILINOGEN  1.0  NITRITE NEGATIVE  LEUKOCYTESUR NEGATIVE   Lipid Panel    Component Value Date/Time   CHOL 205* 05/09/2013 0108   TRIG 68 05/09/2013 0108   HDL 59 05/09/2013 0108   CHOLHDL 3.5 05/09/2013 0108   VLDL 14 05/09/2013 0108   LDLCALC 132* 05/09/2013 0108   HgbA1C  Lab Results  Component Value Date   HGBA1C 5.3 05/09/2013    Urine Drug Screen:      Component Value Date/Time   LABOPIA NONE DETECTED 05/29/2013 1159    Alcohol Level:   Recent Labs Lab 05/29/13 1143  ETH <11   CT of the brain   05/29/2013    1. There is no evidence of an acute intracranial hemorrhage nor of an evolving ischemic infarction. 2. There are white matter density changes as well as old lacunar infarctions consistent with chronic small vessel ischemia. 3. There is no intracranial mass effect or hydrocephalus. 4. The observed portions of the  paranasal sinuses and mastoid air cells exhibit no inflammatory change. 05/08/2013    Atrophy with small vessel disease. No demonstrable mass, hemorrhage, or acute appearing infarct.   MRI of the brain  05/29/2013    Small acute/ subacute nonhemorrhagic infarct lateral aspect of the right thalamus. Tiny acute/subacute right paracentral pontine infarct may also be present.  Moderate small vessel disease type changes.  Global atrophy without hydrocephalus.  No intracranial hemorrhage 05/08/2013    No acute ischemia.  Moderate to severe white matter changes suggest chronic small vessel ischemic disease with remote bilateral basal ganglia and thalamic lacunar infarcts. Suspected empty sella.   MRA of the brain   05/29/2013    1.8 mm aneurysm right middle cerebral artery bifurcation suspected.  05/08/2013   Normal MRA of the head, specifically no hemodynamically significant stenosis.     2D Echocardiogram    Carotid Doppler  No evidence of hemodynamically significant internal carotid artery stenosis. Vertebral artery flow is antegrade.   CXR  05/08/2013    No active cardiopulmonary disease.  Hyperinflated lungs.  EKG  Normal sinus rhythm. Left axis deviation. Minimal voltage criteria for LVH, may be normal variant.   Therapy Recommendations   Physical Exam   Pleasant elderly lady not in distress.Awake alert. Afebrile. Head is nontraumatic. Neck is supple without bruit. Hearing is normal. Cardiac exam no murmur or gallop. Lungs are clear to auscultation. Distal pulses are well felt. Neurological Exam ;  Awake  Alert oriented x 3. Normal speech and language.eye movements full without nystagmus.fundi were not visualized. Vision acuity and fields appear normal. Hearing is normal. Palatal movements are normal. Face symmetric. Tongue midline. Normal strength, tone, reflexes and coordination. Diminished left face and upper extremity sensation. Gait deferred.  ASSESSMENT [name] is a 72 y.o.  female presenting with left face and hand numbness, and " dull hearing" in pt here less than 1 month ago with similar sx and presumed brainstem stroke. MRI confirms a small right lateral thalamic infarct, and tiny right paracentral pontine infarct - both acute/subacute . Infarcts felt to be thrombotic secondary to small vessel disease.  On aspirin 81 mg orally every day prior to admission. Now on clopidogrel 75 mg orally every day for secondary stroke prevention. Patient with resultant mild left face and hand hemisensory deficit.  Hypertension  Hyperlipidemia, LDL 132 11/19, on lipitor 40 PTA, now on lipitor 40, goal LDL < 100  Obesity, Body mass index is 33.23 kg/(m^2). Snores, feels tired during the day ? obstructive  sleep apnea  Per Cardiac consult last admission - does not feel left arm numbness is cardiac related; no documented hx of atrial fibrillation Chads2Vasc=3, would not anticoagulate  Hospital day # 1  TREATMENT/PLAN RECOMMENDATIONS  Continue  clopidogrel 75 mg orally every day for secondary stroke prevention. F/u 2 D echo No additional stroke workup indicated Follow up with Dr. Pearlean Brownie, Stroke Clinic, in 2 months.   Annie Main, MSN, RN, ANVP-BC, ANP-BC, [facility] Pager: [phone] 05/30/2013 12:02 PM  I have personally obtained a history, examined the patient, evaluated imaging results, and formulated the assessment and plan of care. I agree with the above. Delia Heady, MD

## 2013-05-30 NOTE — Progress Notes (Signed)
Subjective: Patient resting in bed comfortably in no distress. Notes her numbness/tingling is still present although improved since yesterday. Denies any new neuro deficits or worsening of symptoms since admission.   Objective: Vital signs in last 24 hours: Filed Vitals:   05/30/13 0300 05/30/13 0500 05/30/13 0703 05/30/13 0959  BP: 145/80 165/98 168/81 140/84  Pulse: 74 77  76  Temp: 98 F (36.7 C) 98.3 F (36.8 C)  97.8 F (36.6 C)  TempSrc: Oral Oral  Oral  Resp: 20 18  18   Height:      Weight:      SpO2: 99% 99%  100%   Weight change:  No intake or output data in the 24 hours ending 05/30/13 1125 BP 140/84  Pulse 76  Temp(Src) 97.8 F (36.6 C) (Oral)  Resp 18  Ht 5\' 9"  (1.753 m)  Wt 102.105 kg (225 lb 1.6 oz)  BMI 33.23 kg/m2  SpO2 100% General appearance: alert, cooperative and no distress Head: Normocephalic, without obvious abnormality, atraumatic Eyes: conjunctiva/cornea clear, PERRL, EOMI Throat: lips, mucosa, and tongue normal; teeth and gums normal Lungs: clear to auscultation bilaterally Heart: regular rate and rhythm, S1, S2 normal, no murmur, click, rub or gallop Abdomen: soft, non-tender; bowel sounds normal; no masses,  no organomegaly Extremities: edema trace-1+ B/L LE  Skin: Skin color, texture, turgor normal. No rashes or lesions Neurologic: Mental status: Alert, oriented, thought content appropriate Cranial nerves: VIII: hearing Left ear decreased hearing to finger rub (improved since admission)  Sensory: decreased sensation to left face (V2,V3), left arm/forearm, left thigh, left leg (improved since admission) Motor: grossly normal Lab Results: Basic Metabolic Panel:  Recent Labs Lab 05/29/13 1143 05/29/13 1155 05/30/13 0620  NA 140 143 139  K 4.0 3.6 3.4*  CL 104 103 106  CO2 28  --  25  GLUCOSE 111* 114* 120*  BUN 12 11 8   CREATININE 0.55 0.70 0.52  CALCIUM 9.5  --  9.0   Liver Function Tests:  Recent Labs Lab 05/29/13 1143    AST 13  ALT 10  ALKPHOS 144*  BILITOT 0.9  PROT 6.7  ALBUMIN 3.8   No results found for this basename: LIPASE, AMYLASE,  in the last 168 hours No results found for this basename: AMMONIA,  in the last 168 hours CBC:  Recent Labs Lab 05/29/13 1143 05/29/13 1155 05/30/13 0620  WBC 5.1  --  4.9  NEUTROABS 3.1  --   --   HGB 12.4 12.9 12.0  HCT 37.7 38.0 35.9*  MCV 85.5  --  83.7  PLT 167  --  164   Cardiac Enzymes:  Recent Labs Lab 05/29/13 1144  TROPONINI <0.30   BNP: No results found for this basename: PROBNP,  in the last 168 hours D-Dimer: No results found for this basename: DDIMER,  in the last 168 hours CBG:  Recent Labs Lab 05/29/13 1123  GLUCAP 117*   Hemoglobin A1C: No results found for this basename: HGBA1C,  in the last 168 hours Fasting Lipid Panel: No results found for this basename: CHOL, HDL, LDLCALC, TRIG, CHOLHDL, LDLDIRECT,  in the last 168 hours Thyroid Function Tests: No results found for this basename: TSH, T4TOTAL, FREET4, T3FREE, THYROIDAB,  in the last 168 hours Coagulation:  Recent Labs Lab 05/29/13 1143  LABPROT 13.5  INR 1.05   Anemia Panel: No results found for this basename: VITAMINB12, FOLATE, FERRITIN, TIBC, IRON, RETICCTPCT,  in the last 168 hours Urine Drug Screen: Drugs of Abuse  Component Value Date/Time   LABOPIA NONE DETECTED 05/29/2013 1159   COCAINSCRNUR NONE DETECTED 05/29/2013 1159   LABBENZ NONE DETECTED 05/29/2013 1159   AMPHETMU NONE DETECTED 05/29/2013 1159   THCU NONE DETECTED 05/29/2013 1159   LABBARB NONE DETECTED 05/29/2013 1159    Alcohol Level:  Recent Labs Lab 05/29/13 1143  ETH <11   Urinalysis:  Recent Labs Lab 05/29/13 1159  COLORURINE YELLOW  LABSPEC 1.014  PHURINE 6.5  GLUCOSEU NEGATIVE  HGBUR NEGATIVE  BILIRUBINUR NEGATIVE  KETONESUR NEGATIVE  PROTEINUR NEGATIVE  UROBILINOGEN 1.0  NITRITE NEGATIVE  LEUKOCYTESUR NEGATIVE    Micro Results: No results found for this or any  previous visit (from the past 240 hour(s)). Studies/Results: Ct Head Wo Contrast  05/29/2013   CLINICAL DATA:  Left sided numbness  EXAM: CT HEAD WITHOUT CONTRAST  TECHNIQUE: Contiguous axial images were obtained from the base of the skull through the vertex without intravenous contrast.  COMPARISON:  MRI of the brain dated May 08, 2013.  FINDINGS: There is mild diffuse cerebral and cerebellar atrophy. The ventricles are normal in size and position. There is no intracranial hemorrhage nor intracranial mass effect. Old lacunar infarctions in the region of the caudate nucleus on the left and the anterior limb of the right internal capsule are present. There are no findings to suggest an evolving ischemic infarction. The cerebellum and brainstem are normal in density.  At bone window settings there is no evidence of an acute skull fracture. The observed portions of the paranasal sinuses and mastoid air cells are clear.  IMPRESSION: 1. There is no evidence of an acute intracranial hemorrhage nor of an evolving ischemic infarction. 2. There are white matter density changes as well as old lacunar infarctions consistent with chronic small vessel ischemia. 3. There is no intracranial mass effect or hydrocephalus. 4. The observed portions of the paranasal sinuses and mastoid air cells exhibit no inflammatory change.   Electronically Signed   By: Gisselle Galvis  [place]   On: 05/29/2013 11:46   Mr [name] Head Wo Contrast  05/29/2013   CLINICAL DATA:  Few days and numbness and tingling left cheek an ear. Hypertension.  EXAM: MRI HEAD WITHOUT CONTRAST  MRA HEAD WITHOUT CONTRAST  TECHNIQUE: Multiplanar, multiecho pulse sequences of the brain and surrounding structures were obtained without intravenous contrast. Angiographic images of the head were obtained using MRA technique without contrast.  COMPARISON:  05/29/2013 CT.  05/08/2013 MR.  FINDINGS: MRI HEAD FINDINGS  Small acute/ subacute nonhemorrhagic infarct lateral aspect of  the right thalamus. Tiny acute/subacute right paracentral pontine infarct may also be present.  Moderate small vessel disease type changes.  Global atrophy without hydrocephalus.  No intracranial hemorrhage.  No intracranial mass lesion noted on this unenhanced exam.  Cervical medullary junction and pineal region unremarkable. Partially empty sella. Minimal exophthalmos. Minimal cervical spondylotic changes C3-4 and C4-5.  MRA HEAD FINDINGS  Anterior circulation without medium or large size vessel significant stenosis or occlusion.  1.8 mm aneurysm right middle cerebral artery bifurcation suspected.  Fetal type contribution to the posterior circulation bilaterally.  Right vertebral artery is dominant with the left vertebral artery small caliber after takeoff of the left posterior inferior cerebellar artery.  Ectatic basilar artery without high-grade stenosis.  Nonvisualized anterior inferior cerebellar arteries.  Slightly bulbous appearance of the basilar tip may be related to the fetal type origin of the posterior cerebral arteries  IMPRESSION: Small acute/ subacute nonhemorrhagic infarct lateral aspect of the right thalamus. Tiny acute/subacute right  paracentral pontine infarct may also be present.  Moderate small vessel disease type changes.  Global atrophy without hydrocephalus.  No intracranial hemorrhage.  1.8 mm aneurysm right middle cerebral artery bifurcation suspected. Please see above for additional MR angiogram findings.  These results were called by telephone at the time of interpretation on 05/29/2013 at 2:50 PM to [facility] , who verbally acknowledged these results.   Electronically Signed   By: Bridgett Larsson M.D.   On: 05/29/2013 14:51   Mr Brain Wo Contrast  05/29/2013   CLINICAL DATA:  Few days and numbness and tingling left cheek an ear. Hypertension.  EXAM: MRI HEAD WITHOUT CONTRAST  MRA HEAD WITHOUT CONTRAST  TECHNIQUE: Multiplanar, multiecho pulse sequences of the brain and surrounding  structures were obtained without intravenous contrast. Angiographic images of the head were obtained using MRA technique without contrast.  COMPARISON:  05/29/2013 CT.  05/08/2013 MR.  FINDINGS: MRI HEAD FINDINGS  Small acute/ subacute nonhemorrhagic infarct lateral aspect of the right thalamus. Tiny acute/subacute right paracentral pontine infarct may also be present.  Moderate small vessel disease type changes.  Global atrophy without hydrocephalus.  No intracranial hemorrhage.  No intracranial mass lesion noted on this unenhanced exam.  Cervical medullary junction and pineal region unremarkable. Partially empty sella. Minimal exophthalmos. Minimal cervical spondylotic changes C3-4 and C4-5.  MRA HEAD FINDINGS  Anterior circulation without medium or large size vessel significant stenosis or occlusion.  1.8 mm aneurysm right middle cerebral artery bifurcation suspected.  Fetal type contribution to the posterior circulation bilaterally.  Right vertebral artery is dominant with the left vertebral artery small caliber after takeoff of the left posterior inferior cerebellar artery.  Ectatic basilar artery without high-grade stenosis.  Nonvisualized anterior inferior cerebellar arteries.  Slightly bulbous appearance of the basilar tip may be related to the fetal type origin of the posterior cerebral arteries  IMPRESSION: Small acute/ subacute nonhemorrhagic infarct lateral aspect of the right thalamus. Tiny acute/subacute right paracentral pontine infarct may also be present.  Moderate small vessel disease type changes.  Global atrophy without hydrocephalus.  No intracranial hemorrhage.  1.8 mm aneurysm right middle cerebral artery bifurcation suspected. Please see above for additional MR angiogram findings.  These results were called by telephone at the time of interpretation on 05/29/2013 at 2:50 PM to [employer] [facility] [facility] , who verbally acknowledged these results.   Electronically Signed   By: Bridgett Larsson M.D.   On: 05/29/2013  14:51   Medications:  I have reviewed the patient's current medications. Prior to Admission:  Prescriptions prior to admission  Medication Sig Dispense Refill  . aspirin EC 81 MG EC tablet Take 1 tablet (81 mg total) by mouth daily.      [name] atenolol (TENORMIN) 50 MG tablet Take 50 mg by mouth daily.      [name] atorvastatin (LIPITOR) 40 MG tablet Take 1 tablet (40 mg total) by mouth daily at 6 PM.  30 tablet  0  . Cholecalciferol (VITAMIN D3) 1000 UNITS CAPS Take 3 capsules (3,000 Units total) by mouth daily.  90 capsule  6  . cloNIDine (CATAPRES) 0.1 MG tablet Take 1 tablet (0.1 mg total) by mouth daily.  30 tablet  9  . Cyanocobalamin (VITAMIN B-12) 500 MCG SUBL Place 1 tablet under the tongue daily.       . digoxin (LANOXIN) 0.125 MG tablet Take 0.125 mg by mouth daily.      . furosemide (LASIX) 20 MG tablet Take 2 tablets (40 mg total)  by mouth daily.  180 tablet  2  . meloxicam (MOBIC) 15 MG tablet Take 15 mg by mouth daily.      . potassium chloride SA (K-DUR,KLOR-CON) 20 MEQ tablet Take 1 tablet (20 mEq total) by mouth daily.  30 tablet  5  . traMADol (ULTRAM) 50 MG tablet Take 1 tablet (50 mg total) by mouth every 6 (six) hours as needed.  120 tablet  0   Scheduled: . atorvastatin  40 mg Oral q1800  . clopidogrel  75 mg Oral Q breakfast  . digoxin  0.125 mg Oral Daily  . heparin  5,000 Units Subcutaneous Q8H  . sodium chloride  3 mL Intravenous Q12H   Continuous:  Scheduled Meds: . atorvastatin  40 mg Oral q1800  . clopidogrel  75 mg Oral Q breakfast  . digoxin  0.125 mg Oral Daily  . heparin  5,000 Units Subcutaneous Q8H  . sodium chloride  3 mL Intravenous Q12H   Continuous Infusions:  PRN Meds:.acetaminophen, acetaminophen, ondansetron (ZOFRAN) IV, ondansetron Assessment/Plan: Active Problems:   HYPERLIPIDEMIA   HYPERTENSION   LEG EDEMA, BILATERAL   Acute ischemic stroke   CVA (cerebral infarction)   Acute thalamic infarction  Assessment: 72 yo woman with PMH prior  CVA, HTN, HL admitted with left sided numbness and MRI reveals small acute/ subacute nonhemorrhagic infarcts in Right thalamus with moderate small vessel disease changes consistent with acute ischemic stroke.   #Acute ischemic stroke  Patient previously admitted 05/08/13 for B/L basal ganglia and thalamic lacunar infarcts and underwent stroke workup. She was started on aspirin and lipitor for secondary prevention. She did not undergo an echo in November since she had a previous echo in March 2014. Patient now with new onset left sided numbness affecting more areas than previously (LE now affected). Differential diagnosis includes TIA vs new ischemic stroke (CHADS2VASc = 5) likely related to small vessel disease as previous stroke workup negative for carotid disease and no further workup from a cardiology perspective. Last echo in March and she has a repeat stroke now, will pursue repeating echo to r/o any cardiac involvement. EKG negative for acute findings. - Previous workup consisted of negative carotid dopplers, aspirin therapy, A1C 5.3 (no DM), dyslipidemia (lipitor 40mg  started, previously on pravastatin), and folate/B12 WNL.  - neurology consult, appreciate recommendations  - neuro checks  - tele monitoring  - 2D echo pending, then ready for discharge - continue plavix  - continue lipitor 40 mg daily  - continue digoxin 0.125mg  daily   #HTN  History of HTN with compliance of her medications at home. Will hold anti-hypertensives for 24 hours to allow for permissive HTN.  - hold atenolol 50mg  daily  - hold lasix 40mg  daily  - hold clonidine 0.1 mg daily  - resume meds on discharge  #Hyperlipidemia  Lipid panel revealed elevated LDL (132) on previous admission and she was changed from pravastatin to lipitor on discharge  - continue lipitor 40 mg daily   ##Disposition: d/c to home after 2D echo completed  This is a Psychologist, occupational Note.  The care of the patient was discussed with Dr.  Sara Chu and the assessment and plan formulated with their assistance.  Please see their attached note for official documentation of the daily encounter.   LOS: 1 day   Lewie Chamber, Med Student 05/30/2013, 11:25 AM

## 2013-05-30 NOTE — ED Provider Notes (Signed)
Medical screening examination/treatment/procedure(s) were conducted as a shared visit with non-physician practitioner(s) and myself.  I personally evaluated the patient during the encounter.  EKG Interpretation    Date/Time:    Ventricular Rate:    PR Interval:    QRS Duration:   QT Interval:    QTC Calculation:   R Axis:     Text Interpretation:               Toy Baker, MD 05/30/13 [phone]

## 2013-05-30 NOTE — H&P (Signed)
    Day 1 of stay      Patient name: [name]  Medical record number: [id number]  Date of birth: December 08, 1940  Summary: 72 y.o. female with left sided numbness and tingling, diagnosed of small acute/subacute thalamic and pontine infarcts.   Subjective: Feels better. Tingling in the left foot is improving.   Objective: Exam today is significant for decreased sensation globally on the left side. Normal vision, normal cranial nerves, no facial droop.    Plan  Acute/Subacute CVA -  The patient was just recently admitted with the same complaints. Agree with change of secondary prevention to plavix. Agree with permissive hypertension and control after 24-48 hours. 2Decho done.    Discharge tomorrow if patient remains stable and the echo is read.   I have seen and evaluated this patient and discussed it with my IM resident team.  Please see the rest of the plan per resident note from today.   Akaash Vandewater 05/30/2013, 1:57 PM.

## 2013-05-30 NOTE — Progress Notes (Signed)
Talked to patient about DCP; patient is active with [facility] as prior to admission and would like to resume therapy's after discharge; [facility] with [facility] called for resumption of therapyAlexis Goodell [phone]

## 2013-05-30 NOTE — Progress Notes (Signed)
Discharge instructions given. Pt verbalized understanding and all questions were answered.  

## 2013-05-30 NOTE — Progress Notes (Addendum)
I have seen the patient and reviewed the daily progress note by Marden Noble MS IV and discussed the care of the patient with them.  See below for documentation of my findings, assessment, and plans.  Subjective: No overnight events, she reports that her left sided tingling and numbness have improved somewhat since yesterday. Denies confusion, headaches, chest pain, or shortness of breath.   Objective: Vital signs in last 24 hours: Filed Vitals:   05/30/13 0300 05/30/13 0500 05/30/13 0703 05/30/13 0959  BP: 145/80 165/98 168/81 140/84  Pulse: 74 77  76  Temp: 98 F (36.7 C) 98.3 F (36.8 C)  97.8 F (36.6 C)  TempSrc: Oral Oral  Oral  Resp: 20 18  18   Height:      Weight:      SpO2: 99% 99%  100%   Weight change:  No intake or output data in the 24 hours ending 05/30/13 1344 Vitals reviewed. General: resting in bed, in NAD HEENT: PERRL, EOMI, no scleral icterus Cardiac: RRR, no rubs, murmurs or gallops Pulm: clear to auscultation bilaterally, no wheezes, rales, or rhonchi Abd: soft, nontender, nondistended, BS present Ext: warm and well perfused, no pedal edema Neuro: alert and oriented X3, cranial nerves II-XII intact, strength 5/5 in UE and LE, decreased sensation to light touch in her left face, left arm, and left leg.   Lab Results: Reviewed and documented in Electronic Record Micro Results: Reviewed and documented in Electronic Record Studies/Results: Reviewed and documented in Electronic Record Medications: I have reviewed the patient's current medications. Scheduled Meds: . atorvastatin  40 mg Oral q1800  . clopidogrel  75 mg Oral Q breakfast  . digoxin  0.125 mg Oral Daily  . heparin  5,000 Units Subcutaneous Q8H  . sodium chloride  3 mL Intravenous Q12H   Continuous Infusions:  PRN Meds:.acetaminophen, acetaminophen, ondansetron (ZOFRAN) IV, ondansetron Assessment/Plan: [name] is a 72 year old woman with PMH of CVA, HTN, hyperlipemia admitted with  left sided numbness and MRI significant for small acute/ subacute nonhemorrhagic infarcts in Right thalamus with moderate small vessel disease changes consistent with acute ischemic stroke.   Acute ischemic stroke - Her symptoms have improved somewhat but she still has numbness and tingling of her left side. 2D echo with preserved EF, no wall motion abnormality, and no source of embolism. Per Neurology, she will be discharged today with Plavix for secondary prevention with CHADS2VASC of 5. Per Neurology, she was not a candidate for tPA given that she only had minor symptoms. She will have follow up with Neurology and with her PCP.  - Continue telemetry  - neurology consult, appreciate recommendations  - frequent neuro checks  - Continue Plavix 75mg  daily for secondary stroke prevention (per Neurology)  - continue lipitor 40 mg daily  - continue digoxin 0.125mg  daily  - will continue digoxin for history of paroxysmal A. Fib   HTN - BP mildly elevated this morning. Will resume antihypertensives upon her discharge :atenolol 50mg  daily, lasix 40mg  daily, lonidine 0.1 mg daily   Hyperlipidemia- LDL (132) on 05/09/13, during her previous admission and she was changed from pravastatin to lipitor on discharge.  - continue lipitor 40 mg daily   VTE prophylaxis: Heparin SQ TID   Dispo: Anticipated discharge is today.    The patient does have a current PCP Carla Ly, MD) and does need an [facility] hospital follow-up appointment after discharge.  The patient does not have transportation limitations that hinder transportation to clinic  appointments.  .Services Needed at time of discharge: Y = Yes, Blank = No PT:   OT:   RN:   Equipment:   Other:     LOS: 1 day   Ky Barban, MD 05/30/2013, 1:44 PM

## 2013-05-30 NOTE — Progress Notes (Signed)
  Echocardiogram 2D Echocardiogram has been performed.  Georgian Co 05/30/2013, 11:33 AM

## 2013-05-30 NOTE — Evaluation (Signed)
Physical Therapy Evaluation Patient Details Name: [name] MRN: [id number] DOB: November 19, 1940 Today's Date: 05/30/2013 Time: 5784-6962 PT Time Calculation (min): 18 min  PT Assessment / Plan / Recommendation History of Present Illness  pt presents with L sided numbness.    Clinical Impression  Pt agreeable to mobility and indicates that she feels almost back to normal.  Pt had been receiving HHPT PTA and would benefit from resumption of HHPT.  No further acute PT needs at this time.  Will sign off.      PT Assessment  All further PT needs can be met in the next venue of care    Follow Up Recommendations  Home health PT;Supervision - Intermittent    Does the patient have the potential to tolerate intense rehabilitation      Barriers to Discharge        Equipment Recommendations  None recommended by PT    Recommendations for Other Services     Frequency      Precautions / Restrictions Precautions Precautions: Fall Restrictions Weight Bearing Restrictions: No   Pertinent Vitals/Pain Denied pain.        Mobility  Bed Mobility Bed Mobility: Supine to Sit;Sitting - Scoot to Edge of Bed Supine to Sit: 7: Independent Sitting - Scoot to Edge of Bed: 7: Independent Transfers Transfers: Sit to Stand;Stand to Sit Sit to Stand: 6: Modified independent (Device/Increase time);With upper extremity assist;From bed Stand to Sit: 6: Modified independent (Device/Increase time);With upper extremity assist;To chair/3-in-1 Ambulation/Gait Ambulation/Gait Assistance: 6: Modified independent (Device/Increase time) Ambulation Distance (Feet): 200 Feet Assistive device: Rolling walker Ambulation/Gait Assistance Details: pt demos good use of RW.   Gait Pattern: Step-through pattern;Decreased stride length Stairs: No Wheelchair Mobility Wheelchair Mobility: No    Exercises     PT Diagnosis:    PT Problem List:   PT Treatment Interventions:       PT Goals(Current goals can be  found in the care plan section)    Visit Information  Last PT Received On: 05/30/13 Assistance Needed: +1 History of Present Illness: pt presents with L sided numbness.         Prior Functioning  Home Living Family/patient expects to be discharged to:: Private residence Living Arrangements: Alone Available Help at Discharge: Family;Friend(s);Available PRN/intermittently Type of Home: Apartment Home Access: Level entry Home Layout: One level Home Equipment: Walker - 2 wheels Additional Comments: pt notes between her family and friends, someone will be checking on her every day.   Prior Function Level of Independence: Needs assistance Gait / Transfers Assistance Needed: Uses RW.   ADL's / Homemaking Assistance Needed: Family helps with laundry and preps meals for her.   Comments: pt no longer drives Communication Communication: No difficulties Dominant Hand: Right    Cognition  Cognition Arousal/Alertness: Awake/alert Behavior During Therapy: WFL for tasks assessed/performed Overall Cognitive Status: Within Functional Limits for tasks assessed    Extremity/Trunk Assessment Upper Extremity Assessment Upper Extremity Assessment: Overall WFL for tasks assessed Lower Extremity Assessment Lower Extremity Assessment: Generalized weakness Cervical / Trunk Assessment Cervical / Trunk Assessment: Normal   Balance Balance Balance Assessed: No  End of Session PT - End of Session Equipment Utilized During Treatment: Gait belt Activity Tolerance: Patient tolerated treatment well Patient left: in chair;with call bell/phone within reach Nurse Communication: Mobility status  GP     Sunny Schlein, [place] [phone] 05/30/2013, 2:33 PM

## 2013-06-03 NOTE — Discharge Summary (Signed)
Name: [name] MRN: [id number] DOB: [birth year] 72 y.o. PCP: Farley Ly, MD  Date of Admission: 05/29/2013 10:09 AM Date of Discharge: 05/30/2013 Attending Physician: Dr. Aletta Edouard, MD  Discharge Diagnosis: Principal Problem:   CVA (cerebral infarction) Active Problems:   HYPERLIPIDEMIA   HYPERTENSION   LEG EDEMA, BILATERAL   Acute ischemic stroke   Acute thalamic infarction  Discharge Medications:   Medication List    STOP taking these medications       aspirin 81 MG EC tablet      TAKE these medications       atenolol 50 MG tablet  Commonly known as:  TENORMIN  Take 50 mg by mouth daily.     atorvastatin 40 MG tablet  Commonly known as:  LIPITOR  Take 1 tablet (40 mg total) by mouth daily at 6 PM.     cloNIDine 0.1 MG tablet  Commonly known as:  CATAPRES  Take 1 tablet (0.1 mg total) by mouth daily.     clopidogrel 75 MG tablet  Commonly known as:  PLAVIX  Take 1 tablet (75 mg total) by mouth daily with breakfast.     digoxin 0.125 MG tablet  Commonly known as:  LANOXIN  Take 0.125 mg by mouth daily.     furosemide 20 MG tablet  Commonly known as:  LASIX  Take 2 tablets (40 mg total) by mouth daily.     meloxicam 15 MG tablet  Commonly known as:  MOBIC  Take 15 mg by mouth daily.     potassium chloride SA 20 MEQ tablet  Commonly known as:  K-DUR,KLOR-CON  Take 1 tablet (20 mEq total) by mouth daily.     traMADol 50 MG tablet  Commonly known as:  ULTRAM  Take 1 tablet (50 mg total) by mouth every 6 (six) hours as needed.     Vitamin B-12 500 MCG Subl  Place 1 tablet under the tongue daily.     Vitamin D3 1000 UNITS Caps  Take 3 capsules (3,000 Units total) by mouth daily.        Disposition and follow-up:   [name] was discharged from [facility] in Stable condition.  At the hospital follow up visit please address:  1.  Assure compliance to Plavix and that she follows up with Dr. Pearlean Brownie, in  Neurology in 2 months. Reassess blood pressure and need for anti-hypertensives. Counsel patient on avoidance of omeprazole therapy while on Plavix.   2.  Labs / imaging needed at time of follow-up: None  3.  Pending labs/ test needing follow-up: None  Follow-up Appointments: Follow-up Information   Follow up with Gates Rigg, MD. Schedule an appointment as soon as possible for a visit in 2 months. (stroke clinic)    Specialties:  Neurology, Radiology   Contact information:   [address] [place] [address] [phone]       Follow up with Christen Bame, MD On 06/11/2013. (at 8:45 AM. Please bring all your medications with you. )    Specialty:  Internal Medicine   Contact information:   [address] [place] [address] [phone]       Discharge Instructions: Discharge Orders   Future Appointments Provider Department Dept Phone   06/11/2013 8:45 AM Christen Bame, MD [facility] [phone]   07/19/2013 3:00 PM Micki Riley, MD [facility] [phone]   07/25/2013 9:15 AM Farley Ly, MD  [facility] [phone]   Future Orders Complete By Expires   Diet - low sodium heart healthy  As directed    Increase activity slowly  As directed       Consultations: Treatment Team:  Md Stroke, MD  Procedures Performed:  Dg Chest 2 View  05/08/2013   CLINICAL DATA:  Stroke, hypertension  EXAM: CHEST  2 VIEW  COMPARISON:  Chest radiograph 11/29/2008  FINDINGS: Normal cardiac silhouette. No effusion, infiltrate, or pneumothorax. Lungs are hyperinflated. Degenerative osteophytosis of the thoracic spine.  IMPRESSION: No active cardiopulmonary disease.  Hyperinflated lungs.   Electronically Signed   By: Genevive Bi M.D.   On: 05/08/2013 20:40   Ct Head Wo Contrast  05/29/2013   CLINICAL DATA:  Left sided numbness  EXAM: CT HEAD WITHOUT CONTRAST  TECHNIQUE: Contiguous axial images were  obtained from the base of the skull through the vertex without intravenous contrast.  COMPARISON:  MRI of the brain dated May 08, 2013.  FINDINGS: There is mild diffuse cerebral and cerebellar atrophy. The ventricles are normal in size and position. There is no intracranial hemorrhage nor intracranial mass effect. Old lacunar infarctions in the region of the caudate nucleus on the left and the anterior limb of the right internal capsule are present. There are no findings to suggest an evolving ischemic infarction. The cerebellum and brainstem are normal in density.  At bone window settings there is no evidence of an acute skull fracture. The observed portions of the paranasal sinuses and mastoid air cells are clear.  IMPRESSION: 1. There is no evidence of an acute intracranial hemorrhage nor of an evolving ischemic infarction. 2. There are white matter density changes as well as old lacunar infarctions consistent with chronic small vessel ischemia. 3. There is no intracranial mass effect or hydrocephalus. 4. The observed portions of the paranasal sinuses and mastoid air cells exhibit no inflammatory change.   Electronically Signed   By: David  [place]   On: 05/29/2013 11:46   Ct Head (brain) Wo Contrast  05/08/2013   CLINICAL DATA:  Dizziness with left upper extremity numbness  EXAM: CT HEAD WITHOUT CONTRAST  TECHNIQUE: Contiguous axial images were obtained from the base of the skull through the vertex without intravenous contrast. Study was obtained within 24 hr of patient's arrival at the emergency department.  COMPARISON:  Report of prior study Nov 10, 2000; images from that study not available.  FINDINGS: The ventricles are normal in size and configuration. There is no mass, hemorrhage, extra-axial fluid collection, or midline shift. There is patchy small vessel disease in the centra semiovale bilaterally. There is also small vessel disease in the anterior limbs of both internal and external capsules. No  acute appearing infarct identified. Elsewhere gray-white compartments are normal.  Bony calvarium appears intact.  The mastoid air cells are clear.  IMPRESSION: Atrophy with small vessel disease. No demonstrable mass, hemorrhage, or acute appearing infarct.   Electronically Signed   By: Bretta Bang M.D.   On: 05/08/2013 13:12   Mr Maxine Glenn Head Wo Contrast  05/29/2013   CLINICAL DATA:  Few days and numbness and tingling left cheek an ear. Hypertension.  EXAM: MRI HEAD WITHOUT CONTRAST  MRA HEAD WITHOUT CONTRAST  TECHNIQUE: Multiplanar, multiecho pulse sequences of the brain and surrounding structures were obtained without intravenous contrast. Angiographic images of the head were obtained using MRA technique without contrast.  COMPARISON:  05/29/2013 CT.  05/08/2013 MR.  FINDINGS: MRI HEAD FINDINGS  Small acute/ subacute nonhemorrhagic infarct lateral aspect of the right thalamus. Tiny acute/subacute right paracentral pontine infarct may also be present.  Moderate small vessel disease type changes.  Global atrophy without hydrocephalus.  No intracranial hemorrhage.  No intracranial mass lesion noted on this unenhanced exam.  Cervical medullary junction and pineal region unremarkable. Partially empty sella. Minimal exophthalmos. Minimal cervical spondylotic changes C3-4 and C4-5.  MRA HEAD FINDINGS  Anterior circulation without medium or large size vessel significant stenosis or occlusion.  1.8 mm aneurysm right middle cerebral artery bifurcation suspected.  Fetal type contribution to the posterior circulation bilaterally.  Right vertebral artery is dominant with the left vertebral artery small caliber after takeoff of the left posterior inferior cerebellar artery.  Ectatic basilar artery without high-grade stenosis.  Nonvisualized anterior inferior cerebellar arteries.  Slightly bulbous appearance of the basilar tip may be related to the fetal type origin of the posterior cerebral arteries  IMPRESSION: Small  acute/ subacute nonhemorrhagic infarct lateral aspect of the right thalamus. Tiny acute/subacute right paracentral pontine infarct may also be present.  Moderate small vessel disease type changes.  Global atrophy without hydrocephalus.  No intracranial hemorrhage.  1.8 mm aneurysm right middle cerebral artery bifurcation suspected. Please see above for additional MR angiogram findings.  These results were called by telephone at the time of interpretation on 05/29/2013 at 2:50 PM to [facility] , who verbally acknowledged these results.   Electronically Signed   By: Bridgett Larsson M.D.   On: 05/29/2013 14:51   Mr Brain Wo Contrast  05/29/2013   CLINICAL DATA:  Few days and numbness and tingling left cheek an ear. Hypertension.  EXAM: MRI HEAD WITHOUT CONTRAST  MRA HEAD WITHOUT CONTRAST  TECHNIQUE: Multiplanar, multiecho pulse sequences of the brain and surrounding structures were obtained without intravenous contrast. Angiographic images of the head were obtained using MRA technique without contrast.  COMPARISON:  05/29/2013 CT.  05/08/2013 MR.  FINDINGS: MRI HEAD FINDINGS  Small acute/ subacute nonhemorrhagic infarct lateral aspect of the right thalamus. Tiny acute/subacute right paracentral pontine infarct may also be present.  Moderate small vessel disease type changes.  Global atrophy without hydrocephalus.  No intracranial hemorrhage.  No intracranial mass lesion noted on this unenhanced exam.  Cervical medullary junction and pineal region unremarkable. Partially empty sella. Minimal exophthalmos. Minimal cervical spondylotic changes C3-4 and C4-5.  MRA HEAD FINDINGS  Anterior circulation without medium or large size vessel significant stenosis or occlusion.  1.8 mm aneurysm right middle cerebral artery bifurcation suspected.  Fetal type contribution to the posterior circulation bilaterally.  Right vertebral artery is dominant with the left vertebral artery small caliber after takeoff of the left posterior  inferior cerebellar artery.  Ectatic basilar artery without high-grade stenosis.  Nonvisualized anterior inferior cerebellar arteries.  Slightly bulbous appearance of the basilar tip may be related to the fetal type origin of the posterior cerebral arteries  IMPRESSION: Small acute/ subacute nonhemorrhagic infarct lateral aspect of the right thalamus. Tiny acute/subacute right paracentral pontine infarct may also be present.  Moderate small vessel disease type changes.  Global atrophy without hydrocephalus.  No intracranial hemorrhage.  1.8 mm aneurysm right middle cerebral artery bifurcation suspected. Please see above for additional MR angiogram findings.  These results were called by telephone at the time of interpretation on 05/29/2013 at 2:50 PM to [facility] , who verbally acknowledged these results.   Electronically Signed   By: Bridgett Larsson M.D.   On: 05/29/2013 14:51   Mr Brain  Wo Contrast  05/08/2013   CLINICAL DATA:  Numbness and left face and cheek with intermittent left arm numbness.  EXAM: MRI HEAD WITHOUT CONTRAST  MRA HEAD WITHOUT CONTRAST  TECHNIQUE: Multiplanar, multiecho pulse sequences of the brain and surrounding structures were obtained without intravenous contrast. Angiographic images of the head were obtained using MRA technique without contrast.  COMPARISON:  CT of head May 08, 2013 at 1303 hr.  FINDINGS: MRI HEAD FINDINGS  No reduced diffusion to suggest acute ischemia. A few punctate foci of T2 shine through within the left posterior frontal lobe, precentral gyrus. No susceptibility artifact to suggest hemorrhage. Tiny flow void and left thalamus and left globe pallidus.  The ventricles and sulci are normal for patient's age. Patchy to confluent supratentorial, pontine white matter T2 hyperintensities. Additional bilateral subcentimeter basal ganglia T2 hyperintensities, patchy T2 bright signal within the bilateral thalami . No midline shift, mass effect nor mass lesions.  No  abnormal extra-axial fluid collections.  Patient is edentulous. Paranasal sinuses and mastoid air cells appear well-aerated. Ocular globes and orbital contents are nonsuspicious though not tailored for evaluation. No suspicious calvarial bone marrow signal. Mildly expanded predominant fluid filled apparent empty sella. Craniocervical junction maintained.  MRA HEAD FINDINGS  Anterior circulation: Normal flow related enhancement of the included cervical, petrous, cavernous and supra clinoid internal carotid arteries. Normal appearance of the anterior and middle cerebral arteries, including more distal segments with patent anterior communicating artery.  Posterior circulation: Codominant vertebral arteries, with mildly tortuous basilar artery, which shows gentle tapering, with compensatory request contribution from bilateral posterior communicating arteries (normal variant), and normal flow with normal flow related enhancement of the bilateral posterior cerebral arteries.  No large vessel occlusion, hemodynamically significant stenosis, suspicious luminal irregularity or dissection.  IMPRESSION: No acute ischemia.  Moderate to severe white matter changes suggest chronic small vessel ischemic disease with remote bilateral basal ganglia and thalamic lacunar infarcts. Suspected empty sella.  Normal MRA of the head, specifically no hemodynamically significant stenosis.   Electronically Signed   By: Awilda Metro   On: 05/08/2013 22:59   Mr Maxine Glenn Head/brain Wo Cm  05/08/2013   CLINICAL DATA:  Numbness and left face and cheek with intermittent left arm numbness.  EXAM: MRI HEAD WITHOUT CONTRAST  MRA HEAD WITHOUT CONTRAST  TECHNIQUE: Multiplanar, multiecho pulse sequences of the brain and surrounding structures were obtained without intravenous contrast. Angiographic images of the head were obtained using MRA technique without contrast.  COMPARISON:  CT of head May 08, 2013 at 1303 hr.  FINDINGS: MRI HEAD FINDINGS   No reduced diffusion to suggest acute ischemia. A few punctate foci of T2 shine through within the left posterior frontal lobe, precentral gyrus. No susceptibility artifact to suggest hemorrhage. Tiny flow void and left thalamus and left globe pallidus.  The ventricles and sulci are normal for patient's age. Patchy to confluent supratentorial, pontine white matter T2 hyperintensities. Additional bilateral subcentimeter basal ganglia T2 hyperintensities, patchy T2 bright signal within the bilateral thalami . No midline shift, mass effect nor mass lesions.  No abnormal extra-axial fluid collections.  Patient is edentulous. Paranasal sinuses and mastoid air cells appear well-aerated. Ocular globes and orbital contents are nonsuspicious though not tailored for evaluation. No suspicious calvarial bone marrow signal. Mildly expanded predominant fluid filled apparent empty sella. Craniocervical junction maintained.  MRA HEAD FINDINGS  Anterior circulation: Normal flow related enhancement of the included cervical, petrous, cavernous and supra clinoid internal carotid arteries. Normal appearance of the anterior and  middle cerebral arteries, including more distal segments with patent anterior communicating artery.  Posterior circulation: Codominant vertebral arteries, with mildly tortuous basilar artery, which shows gentle tapering, with compensatory request contribution from bilateral posterior communicating arteries (normal variant), and normal flow with normal flow related enhancement of the bilateral posterior cerebral arteries.  No large vessel occlusion, hemodynamically significant stenosis, suspicious luminal irregularity or dissection.  IMPRESSION: No acute ischemia.  Moderate to severe white matter changes suggest chronic small vessel ischemic disease with remote bilateral basal ganglia and thalamic lacunar infarcts. Suspected empty sella.  Normal MRA of the head, specifically no hemodynamically significant stenosis.    Electronically Signed   By: Awilda Metro   On: 05/08/2013 22:59    2D Echo:  05/30/13: Study Conclusions  - Left ventricle: The cavity size was normal. Systolic function was normal. The estimated ejection fraction was in the range of 60% to 65%. Wall motion was normal; there were no regional wall motion abnormalities. - Mitral valve: Calcified annulus. - Left atrium: The atrium was mildly dilated. - Pulmonary arteries: Systolic pressure was mildly increased. PA peak pressure: 39mm Hg (S).  Admission HPI:  Ms. Bin is a 72 year old woman with PMH significant for HTN, hyperlipidemia, and previous CVA 05/08/13 who presents to ED with complaint of left sided numbness. She reports her symptoms are similar to her presentation in November when she was admitted for evaluation of significant left face and left arm numbness with MRI showing bilateral basal ganglia and thalamic lacunar infarcts. She was evaluated by Neurology with recommendations for risk factor modification and was started on aspirin and lipitor for secondary CVA prevention. She was also evaluated by Cardiology during that admission for chest pain but was found to have no acute coronary syndrome.  Her present symptoms started today, suddenly while she hung up the phone after a routine phone call. The symptoms comprised of left sided numbness/tingling (face, arm, forearm, hand, thigh, leg, foot). She immediately called a friend who called EMS and she was brought to the ED. She notes that her numbness/tingling is slightly improved from this morning thought she notes a slight decrease in her hearing.  Of note, she takes digoxin at home for presumed A. Fib and has been subtherapeautic in the past, but her CHADS2VASc = 3 on previous admission and no anticoagulation was recommended by Cardiology.  She denies confusion, headache, chest pain, shortness of breath, abdominal pain, dysuria, nausea, vomiting, or diarrhea.  Hospital Course  by problem list: 1. Acute ischemic CVA - MRI/MRA revealed moderate small vessel disease (presumed cause of CVAs per neurology) and acute/subacute infarct lateral aspect of right thalamus. Previously hospitalized 05/08/13-05/10/13 for her first episode of ischemic CVA (B/L basal ganglia and thalamic lacunar infarcts) with complaint of left face and left arm numbness/tingling. At that time she underwent full stroke workup with Neurology consult except for 2D echo as she had a previous echo March 2014 and was discharged on aspirin and Lipitor (switched from pravastatin) after risk factor assessment and modification. She had been compliant to ASA but now has had a second CVA with numbness and tingling of her left face, arm, and leg. Her symptoms resolved spontaneously and she was not started on tPA her symptoms were mild. She was, however, started on Plavix for secondary prevention, per Neurology's recommendation as she has failed ASA treatment for secondary prevention. A 2D echo was performed during this current hospitalization and was negative for source of emboli. She was discharged home with  no other changes in her home medications and will follow up with her PCP and Neurology.   2. HTN - She is compliant to her home medications which were initially held to allow permissive hypertension in the setting of acute/subacute CVA. Her blood pressure remained below 180/90 during this hospitalization. Her home medications were resumed at her discharge. She will follow up with her PCP for blood pressure recheck.   3. Hyperlipidemia -During her first hospitalization for CVA she had been switched from pravastatin to atorvastatin for appropriate therapy in secondary prevention and will continue this treatment upon her discharge. CHL (205), TG (68), HDL (59), LDL (132).    Discharge Vitals:   BP 148/77  Pulse 71  Temp(Src) 98.1 F (36.7 C) (Oral)  Resp 18  Ht 5\' 9"  (1.753 m)  Wt 225 lb 1.6 oz (102.105 kg)  BMI  33.23 kg/m2  SpO2 100%  Discharge Labs:  Basic Metabolic Panel:   Recent Labs  Lab  05/29/13 1143  05/29/13 1155  05/30/13 0620   NA  140  143  139   K  4.0  3.6  3.4*   CL  104  103  106   CO2  28  --  25   GLUCOSE  111*  114*  120*   BUN  12  11  8    CREATININE  0.55  0.70  0.52   CALCIUM  9.5  --  9.0    Liver Function Tests:   Recent Labs  Lab  05/29/13 1143   AST  13   ALT  10   ALKPHOS  144*   BILITOT  0.9   PROT  6.7   ALBUMIN  3.8    CBC:   Recent Labs  Lab  05/29/13 1143  05/29/13 1155  05/30/13 0620   WBC  5.1  --  4.9   NEUTROABS  3.1  --  --   HGB  12.4  12.9  12.0   HCT  37.7  38.0  35.9*   MCV  85.5  --  83.7   PLT  167  --  164    Cardiac Enzymes:   Recent Labs  Lab  05/29/13 1144   TROPONINI  <0.30    CBG:   Recent Labs  Lab  05/29/13 1123   GLUCAP  117*     Signed: Ky Barban, MD 06/03/2013, 9:57 PM   Time Spent on Discharge: 45 minutes Services Ordered on Discharge: [facility] PT Equipment Ordered on Discharge: None

## 2013-06-06 ENCOUNTER — Other Ambulatory Visit (HOSPITAL_COMMUNITY): Payer: Self-pay | Admitting: Internal Medicine

## 2013-06-06 NOTE — Discharge Summary (Signed)
INTERNAL MEDICINE ATTENDING DISCHARGE COSIGN   I evaluated the patient on the day of discharge and discussed the discharge plan with my resident team. I agree with the discharge documentation and disposition.   Aletta Edouard 06/06/2013, 2:35 PM

## 2013-06-11 ENCOUNTER — Encounter: Payer: Self-pay | Admitting: Internal Medicine

## 2013-06-11 ENCOUNTER — Ambulatory Visit: Payer: PRIVATE HEALTH INSURANCE | Admitting: Internal Medicine

## 2013-06-18 ENCOUNTER — Telehealth: Payer: Self-pay | Admitting: Neurology

## 2013-06-18 NOTE — Telephone Encounter (Signed)
Called patient and lvm relaying her appointment date and time. nd asked her to call us back if there was a problem.

## 2013-06-25 ENCOUNTER — Encounter (INDEPENDENT_AMBULATORY_CARE_PROVIDER_SITE_OTHER): Payer: Self-pay

## 2013-06-25 ENCOUNTER — Encounter: Payer: Self-pay | Admitting: Internal Medicine

## 2013-06-25 ENCOUNTER — Ambulatory Visit (INDEPENDENT_AMBULATORY_CARE_PROVIDER_SITE_OTHER): Payer: PRIVATE HEALTH INSURANCE | Admitting: Internal Medicine

## 2013-06-25 VITALS — BP 141/79 | HR 69 | Temp 97.3°F | Ht 68.0 in | Wt 230.7 lb

## 2013-06-25 DIAGNOSIS — E785 Hyperlipidemia, unspecified: Secondary | ICD-10-CM

## 2013-06-25 DIAGNOSIS — Z09 Encounter for follow-up examination after completed treatment for conditions other than malignant neoplasm: Secondary | ICD-10-CM | POA: Insufficient documentation

## 2013-06-25 DIAGNOSIS — I639 Cerebral infarction, unspecified: Secondary | ICD-10-CM

## 2013-06-25 DIAGNOSIS — I10 Essential (primary) hypertension: Secondary | ICD-10-CM

## 2013-06-25 DIAGNOSIS — I6350 Cerebral infarction due to unspecified occlusion or stenosis of unspecified cerebral artery: Secondary | ICD-10-CM

## 2013-06-25 NOTE — Assessment & Plan Note (Signed)
BP Readings from Last 3 Encounters:  06/25/13 141/79  05/30/13 148/77  05/16/13 137/84   Pt is achieving still good control. Will continue current medications.   Atenolol 50mg  qd, clonidine 0.1mg  q daily, lasix 20mg  q daily

## 2013-06-25 NOTE — Assessment & Plan Note (Signed)
Pt recent hospital admission for left sided numbness with MRI imaging revealing acute infarct. Pt compliant with Plavix and doing PT. Still having some numbness but improving and no new symptoms or localizing neurological findings on PE today.  -cont Plavix -cont PT -f/u appt with Dr. Pearlean BrownieSethi and will call pt when appt is coordinated

## 2013-06-25 NOTE — Patient Instructions (Signed)
It was nice to meet you today! Happy new year. Glad to see you are doing and feeling better.   For your blood pressure continue the medicines that you are on with NO changes.   For your stroke: continue taking the Plavix daily and doing your exercises. We will call Dr. Marlis EdelsonSethi's office and set up an appointment for you and let you know.   Otherwise you are doing well and keep up the strong work!

## 2013-06-26 NOTE — Addendum Note (Signed)
Addended by: Carolan ClinesSADEK, Maggie Dworkin M on: 06/26/2013 09:46 AM   Modules accepted: Level of Service

## 2013-06-26 NOTE — Progress Notes (Signed)
Case discussed with Dr. Sadek soon after the resident saw the patient.  We reviewed the resident's history and exam and pertinent patient test results.  I agree with the assessment, diagnosis, and plan of care documented in the resident's note. 

## 2013-06-26 NOTE — Assessment & Plan Note (Signed)
Lab Results  Component Value Date   CHOL 205* 05/09/2013   HDL 59 05/09/2013   LDLCALC 132* 05/09/2013   TRIG 68 05/09/2013   CHOLHDL 3.5 05/09/2013   -continue atorvastatin

## 2013-06-26 NOTE — Progress Notes (Signed)
Subjective:    Patient ID: [name], female    DOB: Dec 07, 1940, 73 y.o.   MRN: [id number]  HPI [name] is a 73 year old woman with a complicated past medical history as listed below who presents for hospital followup.  Patient was recently admitted and discharged on 06/03/13 for new ischemic event seen on MRI. Patient presented with left-sided numbness of her face,upper extremity and lower extremity that then spontaneously resolves while inpatient. She was started on Plavix which she states she has been compliant with at this time. She brings in her pill bottle and all medications were reviewed and it appears that patient is taking correctly. Since that time she has been doing physical therapy with great improvement but still has some residual left-sided facial and left-sided hand numbness. She did not have any associated weakness, any new facial droops or ambulation problems. She denied any headache, blurry vision, chest pain, palpitations, nausea/vomiting/diarrhea. She continues to do physical therapy at home with home health.   Review of Systems  Constitutional: Negative for fever, chills, activity change and fatigue.  HENT: Negative for trouble swallowing.   Eyes: Negative for photophobia and visual disturbance.  Respiratory: Negative for cough, chest tightness and shortness of breath.   Cardiovascular: Negative for chest pain, palpitations and leg swelling.  Gastrointestinal: Negative for nausea, vomiting and diarrhea.  Musculoskeletal: Negative for arthralgias, back pain, gait problem and myalgias.  Neurological: Positive for numbness (left sided face, left hand). Negative for dizziness, tremors, syncope, facial asymmetry, speech difficulty, weakness, light-headedness and headaches.    Past Medical History  Diagnosis Date  . Hypertension   . Hyperlipemia   . DJD (degenerative joint disease)   . Chronic low back pain   . Abnormal electromyogram (EMG)     Low grade right  S1 radiculopathy 7/93.  [name]. Hot flashes   . Rotator cuff syndrome of right shoulder     S/P right shoulder arthroscopic debridement of a massive rotator cuff tear, greater tuberosity and resection of subacromial spur by Dr. [name] 07/07/2005.  [name]. Herniated lumbar intervertebral disc     S/P L4-5 & L5-S1 laminotomy, foraminotomy, and L5-S1 diskectomy by Dr. [name] 11/01/2000.  . Multiple joint pain   . Iron deficiency anemia   . Vitamin D deficiency   . SVT (supraventricular tachycardia)     a. pt says she was told she had afib in the 90's and has been on digoxin since.  . Bilateral leg edema   . Peripheral neuropathy   . Osteopenia   . Seasonal allergic rhinitis   . Elevated alkaline phosphatase level     Bone scan 08/2004 showed no evidence of abnormal bony activity..  . Skin tag     Right axillary area.  . Hypokalemia   . Headache(784.0)   . Diastolic dysfunction     a. 08/2012 Echo: EF 60-65%, Gr 2 DD, triv MR, PASP 44mmHg.  [name]. Left arm numbness     ? 04/2013  . Chest pain     a. reports nl cath in the 1990's.       Objective:   Physical Exam Filed Vitals:   06/25/13 1501  BP: 141/79  Pulse: 69  Temp: 97.3 F (36.3 C)   General: sitting in chair, NAD HEENT: PERRL, EOMI, no scleral icterus Cardiac: RRR, no rubs, murmurs or gallops Pulm: clear to auscultation bilaterally, moving normal volumes of air Abd: soft, nontender, nondistended, BS present Ext: warm and well perfused, no pedal edema  Neuro: alert and oriented X3, cranial nerves II-XII grossly intact, slight decreased sensation over Left face, left upper arm, and left hand, 5/5 UE and LE muscle strength      Assessment & Plan:  Please see problem oriented charting.   Pt discussed with Dr. Meredith Pel.

## 2013-07-16 ENCOUNTER — Ambulatory Visit: Payer: Self-pay | Admitting: Neurology

## 2013-07-19 ENCOUNTER — Encounter: Payer: Self-pay | Admitting: Neurology

## 2013-07-19 ENCOUNTER — Ambulatory Visit (INDEPENDENT_AMBULATORY_CARE_PROVIDER_SITE_OTHER): Payer: Medicare Other | Admitting: Neurology

## 2013-07-19 VITALS — BP 138/77 | HR 67 | Ht 66.5 in | Wt 226.0 lb

## 2013-07-19 DIAGNOSIS — I6381 Other cerebral infarction due to occlusion or stenosis of small artery: Secondary | ICD-10-CM

## 2013-07-19 DIAGNOSIS — I6350 Cerebral infarction due to unspecified occlusion or stenosis of unspecified cerebral artery: Secondary | ICD-10-CM

## 2013-07-19 NOTE — Patient Instructions (Addendum)
I had a long discussion with the patient regarding her recent strokes, stroke risk factors, risk factor modification and answered questions. Continue Plavix for secondary stroke prevention with strict control of hypertension with blood pressure goal below 130/90 and lipids with LDL cholesterol goal below 100 mg percent. Return for followup in 4 months with Levester FreshLynn Lamb, NP or call earlier if necessary

## 2013-07-19 NOTE — Progress Notes (Signed)
[facility] [address] [place]. [place] [id number] [phone]       OFFICE FOLLOW-UP NOTE  Ms. Carla JollyLura M Bagheri Date of Birth:  14-Sep-1940 Medical Record Number:  [id number]   HPI: [id number] year African American lady seen for first office followup visit following hospital admission on 05/29/13 for stroke. She presented with sudden onset of left-sided numbness. She actually had a similar presentation 3 weeks ago in November and that time she is admitted fibrillation for stroke but no acute infarct and bilateral old basal ganglia and thalamic lacunar infarcts are noted. During the current admission she was found to have an acute left thalamic and right pontine infarct. Etiology was found to be small vessel disease. MRI of the brain showed no large vessel occlusion. Transthoracic echo showed normal ejection fraction without context of embolism. Lipid profile showed total cholesterol 205, triglycerides 68, HDL 59 and LDL 132 mg percent. She was started on atorvastatin and Plavix. She still should done well since discharge he still had some intermittent left face and hand tingling and numbness from time to time which is getting better but does not completely improve. She states her blood pressure is well controlled with a slightly elevated in office today. She is tolerating Plavix without bleeding or bruising as well as Lipitor without any side effects. She plans to go on diet, lose weight and exercise regularly. She has no new complaints today.  ROS:   14 system review of systems is positive for ringing in the ears, leg swelling, numbness, joint swelling and all other systems negative PMH:  Past Medical History  Diagnosis Date  . Hypertension   . Hyperlipemia   . DJD (degenerative joint disease)   . Chronic low back pain   . Abnormal electromyogram (EMG)     Low grade right S1 radiculopathy 7/93.  [name]. Hot flashes   . Rotator cuff syndrome of right shoulder     S/P right shoulder  arthroscopic debridement of a massive rotator cuff tear, greater tuberosity and resection of subacromial spur by Dr. [name] 07/07/2005.  [name]. Herniated lumbar intervertebral disc     S/P L4-5 & L5-S1 laminotomy, foraminotomy, and L5-S1 diskectomy by Dr. [name] 11/01/2000.  . Multiple joint pain   . Iron deficiency anemia   . Vitamin D deficiency   . SVT (supraventricular tachycardia)     a. pt says she was told she had afib in the 90's and has been on digoxin since.  . Bilateral leg edema   . Peripheral neuropathy   . Osteopenia   . Seasonal allergic rhinitis   . Elevated alkaline phosphatase level     Bone scan 08/2004 showed no evidence of abnormal bony activity..  . Skin tag     Right axillary area.  . Hypokalemia   . Headache(784.0)   . Diastolic dysfunction     a. 08/2012 Echo: EF 60-65%, Gr 2 DD, triv MR, PASP 44mmHg.  [name]. Left arm numbness     ? 04/2013  . Chest pain     a. reports nl cath in the 1990's.    Social History:  History   Social History  . Marital Status: Single    Spouse Name: N/A    Number of Children: 6  . Years of Education: 121th   Occupational History  . retired    Social History Main Topics  . Smoking status: Former Smoker -- 0.15 packs/day for 2 years    Quit date: 10/07/1999  .  Smokeless tobacco: Never Used  . Alcohol Use: No  . Drug Use: No  . Sexual Activity: Not on file   Other Topics Concern  . Not on file   Social History Narrative  . No narrative on file    Medications:   Current Outpatient Prescriptions on File Prior to Visit  Medication Sig Dispense Refill  . atenolol (TENORMIN) 50 MG tablet Take 50 mg by mouth daily.      [name] atorvastatin (LIPITOR) 40 MG tablet Take 1 tablet (40 mg total) by mouth daily at 6 PM.  30 tablet  5  . Cholecalciferol (VITAMIN D3) 1000 UNITS CAPS Take 3 capsules (3,000 Units total) by mouth daily.  90 capsule  6  . cloNIDine (CATAPRES) 0.1 MG tablet Take 1 tablet (0.1 mg total) by mouth daily.  30  tablet  9  . clopidogrel (PLAVIX) 75 MG tablet Take 1 tablet (75 mg total) by mouth daily with breakfast.  30 tablet  12  . Cyanocobalamin (VITAMIN B-12) 500 MCG SUBL Place 1 tablet under the tongue daily.       . digoxin (LANOXIN) 0.125 MG tablet Take 0.125 mg by mouth daily.      . furosemide (LASIX) 20 MG tablet Take 2 tablets (40 mg total) by mouth daily.  180 tablet  2  . meloxicam (MOBIC) 15 MG tablet Take 15 mg by mouth daily.      . potassium chloride SA (K-DUR,KLOR-CON) 20 MEQ tablet Take 1 tablet (20 mEq total) by mouth daily.  30 tablet  5  . traMADol (ULTRAM) 50 MG tablet Take 1 tablet (50 mg total) by mouth every 6 (six) hours as needed.  120 tablet  0   No current facility-administered medications on file prior to visit.    Allergies:   Allergies  Allergen Reactions  . Penicillins     REACTION: Local swelling in right arm after injection.  . Sulfonamide Derivatives     REACTION: Shortness of breath    Physical Exam General: Obese middle-age African American lady not in distress, seated, in no evident distress Head: head normocephalic and atraumatic. Orohparynx benign Neck: supple with no carotid or supraclavicular bruits Cardiovascular: regular rate and rhythm, no murmurs Musculoskeletal: no deformity Skin:  no rash/petichiae Vascular:  Normal pulses all extremities Filed Vitals:   07/19/13 1517  BP: 138/77  Pulse: 67   Neurologic Exam Mental Status: Awake and fully alert. Oriented to place and time. Recent and remote memory intact. Attention span, concentration and fund of knowledge appropriate. Mood and affect appropriate.  Cranial Nerves: Fundoscopic exam reveals sharp disc margins. Pupils equal, briskly reactive to light. Extraocular movements full without nystagmus. Visual fields full to confrontation. Hearing intact. Facial sensation intact. Face, tongue, palate moves normally and symmetrically.  Motor: Normal bulk and tone. Normal strength in all tested  extremity muscles. Diminished fine finger movements on the left. Orbits right over left upper extremity. Sensory.: intact to touch and pinprick and vibratory sensation. Subjective paresthesias in the left arm and face but no objective sensory loss Coordination: Rapid alternating movements normal in all extremities. Finger-to-nose and heel-to-shin performed accurately bilaterally. Gait and Station: Arises from chair without difficulty. Stance is normal. Gait demonstrates normal stride length and balance . Able to heel, toe and tandem walk without difficulty.  Reflexes: 1+ and symmetric. Toes downgoing.   NIHSS  0 Modified Rankin  1   ASSESSMENT: 72 year with right pontine  And thalamic infarcts in December 2014 from small  vessel disease with vascular risk factors of obesity, hypertension, hyperlipidemia and silent lacunar infarcts.  PLAN:  I had a long discussion with the patient regarding her recent strokes, stroke risk factors, risk factor modification and answered questions. Continue Plavix for secondary stroke prevention with strict control of hypertension with blood pressure goal below 130/90 and lipids with LDL cholesterol goal below 100 mg percent. Return for followup in 4 months with Levester Fresh, NP or call earlier if necessary     Note: This document was prepared with digital dictation and possible smart phrase technology. Any transcriptional errors that result from this process are unintentional

## 2013-07-25 ENCOUNTER — Ambulatory Visit (INDEPENDENT_AMBULATORY_CARE_PROVIDER_SITE_OTHER): Payer: PRIVATE HEALTH INSURANCE | Admitting: Internal Medicine

## 2013-07-25 ENCOUNTER — Encounter: Payer: Self-pay | Admitting: Internal Medicine

## 2013-07-25 VITALS — BP 117/76 | HR 68 | Temp 98.2°F | Wt 224.7 lb

## 2013-07-25 DIAGNOSIS — R609 Edema, unspecified: Secondary | ICD-10-CM

## 2013-07-25 DIAGNOSIS — Z8673 Personal history of transient ischemic attack (TIA), and cerebral infarction without residual deficits: Secondary | ICD-10-CM

## 2013-07-25 DIAGNOSIS — Z1239 Encounter for other screening for malignant neoplasm of breast: Secondary | ICD-10-CM

## 2013-07-25 DIAGNOSIS — I639 Cerebral infarction, unspecified: Secondary | ICD-10-CM

## 2013-07-25 DIAGNOSIS — I10 Essential (primary) hypertension: Secondary | ICD-10-CM

## 2013-07-25 DIAGNOSIS — E785 Hyperlipidemia, unspecified: Secondary | ICD-10-CM

## 2013-07-25 LAB — COMPLETE METABOLIC PANEL WITH GFR
ALK PHOS: 104 U/L (ref 39–117)
ALT: 13 U/L (ref 0–35)
AST: 14 U/L (ref 0–37)
Albumin: 4.3 g/dL (ref 3.5–5.2)
BILIRUBIN TOTAL: 1 mg/dL (ref 0.2–1.2)
BUN: 13 mg/dL (ref 6–23)
CALCIUM: 9.6 mg/dL (ref 8.4–10.5)
CHLORIDE: 103 meq/L (ref 96–112)
CO2: 29 mEq/L (ref 19–32)
Creat: 0.54 mg/dL (ref 0.50–1.10)
GFR, Est African American: >89 mL/min
GFR, Est Non African American: >89 mL/min
Glucose, Bld: 91 mg/dL (ref 70–99)
Potassium: 4.2 mEq/L (ref 3.5–5.3)
Sodium: 139 mEq/L (ref 135–145)
Total Protein: 6.3 g/dL (ref 6.0–8.3)

## 2013-07-25 MED ORDER — FUROSEMIDE 20 MG PO TABS: ORAL_TABLET | ORAL | Status: DC

## 2013-07-25 NOTE — Assessment & Plan Note (Signed)
BP Readings from Last 3 Encounters:  07/25/13 117/76  07/19/13 138/77  06/25/13 141/79    Lab Results  Component Value Date   NA 139 05/30/2013   K 3.4* 05/30/2013   CREATININE 0.52 05/30/2013    Assessment: Blood pressure control: controlled Progress toward BP goal:  at goal Comments: Patient is doing well on atenolol 50 mg daily, clonidine 0.1 mg daily, and furosemide 40 mg daily.    Plan: Medications:  Because of her leg edema, the plan is to increase her furosemide to a dose of 60 mg each Monday, Wednesday, and Friday, and 40 mg on other days. Educational resources provided: brochure Self management tools provided: home blood pressure logbook

## 2013-07-25 NOTE — Assessment & Plan Note (Signed)
Assessment: Patient has 1+ leg edema, likely due to diastolic dysfunction.    Plan: Plan is to increase furosemide to a dose of 60 mg on Monday, Wednesday, and Friday, and 40 mg on other days.  I advised patient to call if she develops any symptoms of dizziness or other problems following this increase.

## 2013-07-25 NOTE — Patient Instructions (Signed)
General Instructions: Increase furosemide (Lasix) 20 mg to a dose of three tablets each Monday, Wednesday, and Friday, and two tablets on other days.   Self Care Goals & Plans:  Self Care Goal 07/25/2013  Manage my medications take my medicines as prescribed; bring my medications to every visit; refill my medications on time  Monitor my health keep track of my blood pressure  Eat healthy foods eat foods that are low in salt; eat baked foods instead of fried foods  Be physically active find an activity I enjoy  Meeting treatment goals maintain the current self-care plan      Care Management & Community Referrals:  Referral 07/25/2013  Referrals made for care management support none needed  Referrals made to community resources none

## 2013-07-25 NOTE — Assessment & Plan Note (Signed)
Assessment: Patient reports some residual subjective left arm numbness/tingling following her CVA, but has no new complaints and is doing well on clopidogrel 75 mg daily.  She was recently seen in followup by her neurologist Dr. Pearlean BrownieSethi.  Plan: Continue clopidogrel 75 mg daily; treat hypertension and hyperlipidemia; follow up with neurologist Dr. Pearlean BrownieSethi in as scheduled.

## 2013-07-25 NOTE — Assessment & Plan Note (Signed)
Lipids:    Component Value Date/Time   CHOL 205* 05/09/2013 0108   TRIG 68 05/09/2013 0108   HDL 59 05/09/2013 0108   LDLCALC 132* 05/09/2013 0108   VLDL 14 05/09/2013 0108   CHOLHDL 3.5 05/09/2013 0108    Assessment: Patient was switched from pravastatin to atorvastatin during her November hospitalization, and is doing well on atorvastatin.   Plan: Continue atorvastatin 40 mg daily; recheck lipid panel in 2-3 months.

## 2013-07-25 NOTE — Progress Notes (Signed)
   Subjective:    Patient ID: [name], female    DOB: 11-17-1940, 73 y.o.   MRN: [id number]  HPI Patient returns for followup of her hypertension, hyperlipidemia, and other chronic medical problems.  She was hospitalized in December with CVA; her symptoms were mainly left arm numbness/tingling, and she reports some persistent left arm numbness/tingling.  She denies any weakness or new neurologic deficits.  She reports that she has been compliant with her medications.   Review of Systems  Constitutional: Negative for fever, chills and diaphoresis.  Respiratory: Negative for shortness of breath.   Cardiovascular: Positive for leg swelling. Negative for chest pain.  Gastrointestinal: Negative for nausea, vomiting, abdominal pain, blood in stool and anal bleeding.  Genitourinary: Negative for dysuria.  Neurological: Positive for numbness (Stable mild left arm numbness and tingling). Negative for dizziness and weakness.   Current medications, allergies, past medical history, family history, and social history were reviewed and updated.     Objective:   Physical Exam  Constitutional: She is oriented to person, place, and time. No distress.  Cardiovascular: Normal rate, regular rhythm and normal heart sounds.  Exam reveals no gallop and no friction rub.   No murmur heard. 1+ bilateral leg edema  Pulmonary/Chest: Effort normal and breath sounds normal. No respiratory distress. She has no wheezes. She has no rales.  Abdominal: Soft. Bowel sounds are normal. She exhibits no distension. There is no tenderness. There is no rebound and no guarding.  Neurological: She is alert and oriented to person, place, and time. She has normal strength. No cranial nerve deficit or sensory deficit.       Assessment & Plan:

## 2013-07-28 ENCOUNTER — Encounter (HOSPITAL_COMMUNITY): Payer: Self-pay | Admitting: Emergency Medicine

## 2013-07-28 ENCOUNTER — Emergency Department (HOSPITAL_COMMUNITY): Payer: PRIVATE HEALTH INSURANCE

## 2013-07-28 ENCOUNTER — Emergency Department (HOSPITAL_COMMUNITY)
Admission: EM | Admit: 2013-07-28 | Discharge: 2013-07-28 | Disposition: A | Discharge status: Home / Self Care | Payer: PRIVATE HEALTH INSURANCE | Attending: Emergency Medicine | Admitting: Emergency Medicine

## 2013-07-28 DIAGNOSIS — Z9109 Other allergy status, other than to drugs and biological substances: Secondary | ICD-10-CM | POA: Insufficient documentation

## 2013-07-28 DIAGNOSIS — M545 Low back pain, unspecified: Secondary | ICD-10-CM | POA: Insufficient documentation

## 2013-07-28 DIAGNOSIS — R112 Nausea with vomiting, unspecified: Secondary | ICD-10-CM

## 2013-07-28 DIAGNOSIS — I10 Essential (primary) hypertension: Secondary | ICD-10-CM | POA: Insufficient documentation

## 2013-07-28 DIAGNOSIS — Z7902 Long term (current) use of antithrombotics/antiplatelets: Secondary | ICD-10-CM | POA: Insufficient documentation

## 2013-07-28 DIAGNOSIS — Z87828 Personal history of other (healed) physical injury and trauma: Secondary | ICD-10-CM | POA: Insufficient documentation

## 2013-07-28 DIAGNOSIS — Z9089 Acquired absence of other organs: Secondary | ICD-10-CM | POA: Insufficient documentation

## 2013-07-28 DIAGNOSIS — D509 Iron deficiency anemia, unspecified: Secondary | ICD-10-CM | POA: Insufficient documentation

## 2013-07-28 DIAGNOSIS — Z87891 Personal history of nicotine dependence: Secondary | ICD-10-CM | POA: Insufficient documentation

## 2013-07-28 DIAGNOSIS — Z872 Personal history of diseases of the skin and subcutaneous tissue: Secondary | ICD-10-CM | POA: Insufficient documentation

## 2013-07-28 DIAGNOSIS — Z8742 Personal history of other diseases of the female genital tract: Secondary | ICD-10-CM | POA: Insufficient documentation

## 2013-07-28 DIAGNOSIS — R1084 Generalized abdominal pain: Secondary | ICD-10-CM | POA: Insufficient documentation

## 2013-07-28 DIAGNOSIS — Z8639 Personal history of other endocrine, nutritional and metabolic disease: Secondary | ICD-10-CM | POA: Insufficient documentation

## 2013-07-28 DIAGNOSIS — M1990 Unspecified osteoarthritis, unspecified site: Secondary | ICD-10-CM | POA: Insufficient documentation

## 2013-07-28 DIAGNOSIS — Z8673 Personal history of transient ischemic attack (TIA), and cerebral infarction without residual deficits: Secondary | ICD-10-CM | POA: Insufficient documentation

## 2013-07-28 DIAGNOSIS — Z79899 Other long term (current) drug therapy: Secondary | ICD-10-CM | POA: Insufficient documentation

## 2013-07-28 DIAGNOSIS — Z8669 Personal history of other diseases of the nervous system and sense organs: Secondary | ICD-10-CM | POA: Insufficient documentation

## 2013-07-28 DIAGNOSIS — E876 Hypokalemia: Secondary | ICD-10-CM | POA: Insufficient documentation

## 2013-07-28 DIAGNOSIS — G8929 Other chronic pain: Secondary | ICD-10-CM | POA: Insufficient documentation

## 2013-07-28 DIAGNOSIS — E785 Hyperlipidemia, unspecified: Secondary | ICD-10-CM | POA: Insufficient documentation

## 2013-07-28 DIAGNOSIS — Z880 Allergy status to penicillin: Secondary | ICD-10-CM | POA: Insufficient documentation

## 2013-07-28 DIAGNOSIS — R197 Diarrhea, unspecified: Secondary | ICD-10-CM | POA: Insufficient documentation

## 2013-07-28 DIAGNOSIS — I498 Other specified cardiac arrhythmias: Secondary | ICD-10-CM | POA: Insufficient documentation

## 2013-07-28 DIAGNOSIS — Z90710 Acquired absence of both cervix and uterus: Secondary | ICD-10-CM | POA: Insufficient documentation

## 2013-07-28 HISTORY — DX: Cerebral infarction, unspecified: I63.9

## 2013-07-28 LAB — COMPREHENSIVE METABOLIC PANEL
ALK PHOS: 115 U/L (ref 39–117)
ALT: 12 U/L (ref 0–35)
AST: 15 U/L (ref 0–37)
Albumin: 4 g/dL (ref 3.5–5.2)
BUN: 13 mg/dL (ref 6–23)
CO2: 23 mEq/L (ref 19–32)
Calcium: 9.2 mg/dL (ref 8.4–10.5)
Chloride: 104 mEq/L (ref 96–112)
Creatinine, Ser: 0.56 mg/dL (ref 0.50–1.10)
GFR calc non Af Amer: >90 mL/min (ref >90)
GLUCOSE: 141 mg/dL — AB (ref 70–99)
POTASSIUM: 3.8 meq/L (ref 3.7–5.3)
SODIUM: 141 meq/L (ref 137–147)
TOTAL PROTEIN: 7 g/dL (ref 6.0–8.3)
Total Bilirubin: 1.1 mg/dL (ref 0.3–1.2)

## 2013-07-28 LAB — CBC WITH DIFFERENTIAL/PLATELET
BASOS PCT: 0 % (ref 0–1)
Basophils Absolute: 0 10*3/uL (ref 0.0–0.1)
EOS PCT: 0 % (ref 0–5)
Eosinophils Absolute: 0 10*3/uL (ref 0.0–0.7)
HEMATOCRIT: 37.6 % (ref 36.0–46.0)
Hemoglobin: 12.6 g/dL (ref 12.0–15.0)
Lymphocytes Relative: 4 % — ABNORMAL LOW (ref 12–46)
Lymphs Abs: 0.3 10*3/uL — ABNORMAL LOW (ref 0.7–4.0)
MCH: 28.2 pg (ref 26.0–34.0)
MCHC: 33.5 g/dL (ref 30.0–36.0)
MCV: 84.1 fL (ref 78.0–100.0)
MONO ABS: 0.2 10*3/uL (ref 0.1–1.0)
Monocytes Relative: 2 % — ABNORMAL LOW (ref 3–12)
Neutro Abs: 6.8 10*3/uL (ref 1.7–7.7)
Neutrophils Relative %: 94 % — ABNORMAL HIGH (ref 43–77)
Platelets: 147 10*3/uL — ABNORMAL LOW (ref 150–400)
RBC: 4.47 MIL/uL (ref 3.87–5.11)
RDW: 13.1 % (ref 11.5–15.5)
WBC: 7.2 10*3/uL (ref 4.0–10.5)

## 2013-07-28 LAB — URINALYSIS, ROUTINE W REFLEX MICROSCOPIC
BILIRUBIN URINE: NEGATIVE
Glucose, UA: NEGATIVE mg/dL
Hgb urine dipstick: NEGATIVE
KETONES UR: NEGATIVE mg/dL
Leukocytes, UA: NEGATIVE
NITRITE: NEGATIVE
Protein, ur: NEGATIVE mg/dL
Specific Gravity, Urine: 1.019 (ref 1.005–1.030)
UROBILINOGEN UA: 1 mg/dL (ref 0.0–1.0)
pH: 5.5 (ref 5.0–8.0)

## 2013-07-28 MED ORDER — ONDANSETRON 4 MG PO TBDP: 4.0000 mg | ORAL_TABLET | Freq: Three times a day (TID) | ORAL | Status: DC | PRN

## 2013-07-28 MED ORDER — SODIUM CHLORIDE 0.9 % IV BOLUS (SEPSIS)
500.0000 mL | Freq: Once | INTRAVENOUS | Status: AC
  Administered 2013-07-28: 500 mL via INTRAVENOUS

## 2013-07-28 MED ORDER — ACETAMINOPHEN 325 MG PO TABS
650.0000 mg | ORAL_TABLET | Freq: Once | ORAL | Status: AC
  Administered 2013-07-28: 650 mg via ORAL
  Filled 2013-07-28: qty 2

## 2013-07-28 MED ORDER — ONDANSETRON HCL 4 MG/2ML IJ SOLN
4.0000 mg | Freq: Once | INTRAMUSCULAR | Status: AC
  Administered 2013-07-28: 4 mg via INTRAVENOUS
  Filled 2013-07-28: qty 2

## 2013-07-28 MED ORDER — SODIUM CHLORIDE 0.9 % IV SOLN
INTRAVENOUS | Status: DC
  Administered 2013-07-28: 12:00:00 via INTRAVENOUS

## 2013-07-28 NOTE — ED Provider Notes (Signed)
Complains of vomiting and diarrhea onset yesterday. Accompanied by slight headache and mid epigastric pain.[name].denies leg pain. She feels improved today over yesterday. She is presently hungry No other complaint. On exam alert no distress alert Glasgow Coma Score 15 lungs clear auscultation heart regular in rhythm abdomen nondistended nontender normal active bowel sounds  Doug SouSam Birdell Frasier, MD 07/28/13 1446

## 2013-07-28 NOTE — ED Notes (Signed)
X-ray paged, pt available for transport. 

## 2013-07-28 NOTE — ED Provider Notes (Signed)
Medical screening examination/treatment/procedure(s) were conducted as a shared visit with non-physician practitioner(s) and myself.  I personally evaluated the patient during the encounter.  EKG Interpretation   None        Keil Pickering, MD 07/28/13 1657 

## 2013-07-28 NOTE — ED Provider Notes (Signed)
Date: 07/28/2013  Rate: 80  Rhythm: normal sinus rhythm and premature ventricular contractions (PVC)  QRS Axis: normal  Intervals: normal  ST/T Wave abnormalities: nonspecific T wave changes  Conduction Disutrbances:none  Narrative Interpretation:   Old EKG Reviewed: PVC new compared to tracing from 05/30/2013 otherwise unchanged as interpreted by me  xrays viewed by me  Doug SouSam Bearett Porcaro, MD 07/28/13 1446

## 2013-07-28 NOTE — Discharge Instructions (Signed)
Take the prescribed medication as directed for nausea.  Continue drinking plenty of fluids to keep yourself hydrated. May wish to start with a bland diet and progress as tolerated.  Follow-up with your primary care physician as needed. Return to the ED for new or worsening symptoms.

## 2013-07-28 NOTE — ED Provider Notes (Signed)
CSN: [id number]     Arrival date & time 07/28/13  1041 History   First MD Initiated Contact with Patient 07/28/13 1157     Chief Complaint  Patient presents with  . Emesis  . Diarrhea   (Consider location/radiation/quality/duration/timing/severity/associated sxs/prior Treatment) The history is provided by the patient and medical records.   This is a 73 year old female with past medical history significant for hypertension, hyperlipidemia, chronic back pain, iron deficiency anemia, CHF, multiple prior CVAs, presenting to the ED for nausea, vomiting, and diarrhea.  Pt states last night she was eating chicken and dumplings and got the sensation that she was going to vomit.  States she had sudden onset of non-bloody, watery diarrhea-- had numerous episodes throughout the night.  This morning had non-bloody, non-bilious emesis x3 episodes thus far.  Denies recent changes in diet or medications.  No recent sick contacts with similar sx.  Notes subjective fever and chills last night, none currently.  No recent abx use.  VS stable on arrival.  Past Medical History  Diagnosis Date  . Hypertension   . Hyperlipemia   . DJD (degenerative joint disease)   . Chronic low back pain   . Abnormal electromyogram (EMG)     Low grade right S1 radiculopathy 7/93.  [name]. Hot flashes   . Rotator cuff syndrome of right shoulder     S/P right shoulder arthroscopic debridement of a massive rotator cuff tear, greater tuberosity and resection of subacromial spur by Dr. [name] 07/07/2005.  [name]. Herniated lumbar intervertebral disc     S/P L4-5 & L5-S1 laminotomy, foraminotomy, and L5-S1 diskectomy by Dr. [name] 11/01/2000.  . Multiple joint pain   . Iron deficiency anemia   . Vitamin D deficiency   . SVT (supraventricular tachycardia)     a. pt says she was told she had afib in the 90's and has been on digoxin since.  . Bilateral leg edema   . Peripheral neuropathy   . Osteopenia   . Seasonal allergic rhinitis   .  Elevated alkaline phosphatase level     Bone scan 08/2004 showed no evidence of abnormal bony activity..  . Skin tag     Right axillary area.  . Hypokalemia   . Headache(784.0)   . Diastolic dysfunction     a. 08/2012 Echo: EF 60-65%, Gr 2 DD, triv MR, PASP 44mmHg.  [name]. Left arm numbness     ? 04/2013  . Chest pain     a. reports nl cath in the 1990's.  . Stroke    Past Surgical History  Procedure Laterality Date  . Total abdominal hysterectomy  1992  . Lumbar disc surgery  11/01/2000     L4-5, L5-S1 laminotomy, foraminotomy and L5-S1 diskectomy  . Shoulder arthroscopy  1/172007    S/P right shoulder arthroscopic debridement of a massive rotator cuff tear, greater tuberoplasty and resection of subacromial spur by Dr. [name] on 07/07/2005.  [name]. Cholecystectomy, laparoscopic  1998  . Cholecystectomy     Family History  Problem Relation Age of Onset  . Ovarian cancer Mother 2670  . Hypertension Mother   . Heart attack Mother 2170  . Hypertension Sister   . Hypertension Sister   . Breast cancer Neg Hx   . Colon cancer Neg Hx   . Lung cancer Neg Hx   . Cancer Brother   . Neuropathy Sister    History  Substance Use Topics  . Smoking status: Former Smoker -- 0.15 packs/day  for 2 years    Quit date: 10/07/1999  . Smokeless tobacco: Never Used  . Alcohol Use: No   OB History   Grav Para Term Preterm Abortions TAB SAB Ect Mult Living                 Review of Systems  Gastrointestinal: Positive for nausea, vomiting and abdominal pain.  All other systems reviewed and are negative.    Allergies  Penicillins and Sulfonamide derivatives  Home Medications   Current Outpatient Rx  Name  Route  Sig  Dispense  Refill  . atenolol (TENORMIN) 50 MG tablet   Oral   Take 50 mg by mouth daily.         [name] atorvastatin (LIPITOR) 40 MG tablet   Oral   Take 1 tablet (40 mg total) by mouth daily at 6 PM.   30 tablet   5   . Cholecalciferol (VITAMIN D3) 1000 UNITS CAPS   Oral    Take 3 capsules (3,000 Units total) by mouth daily.   90 capsule   6   . cloNIDine (CATAPRES) 0.1 MG tablet   Oral   Take 1 tablet (0.1 mg total) by mouth daily.   30 tablet   9   . clopidogrel (PLAVIX) 75 MG tablet   Oral   Take 1 tablet (75 mg total) by mouth daily with breakfast.   30 tablet   12   . Cyanocobalamin (VITAMIN B-12) 500 MCG SUBL   Sublingual   Place 1 tablet under the tongue daily.          . digoxin (LANOXIN) 0.125 MG tablet   Oral   Take 0.125 mg by mouth daily.         . furosemide (LASIX) 20 MG tablet      Take three tablets each Monday, Wednesday, and Friday; take two tablets on other days.   180 tablet   2   . meloxicam (MOBIC) 15 MG tablet   Oral   Take 15 mg by mouth daily as needed for pain.          . potassium chloride SA (K-DUR,KLOR-CON) 20 MEQ tablet   Oral   Take 1 tablet (20 mEq total) by mouth daily.   30 tablet   5   . traMADol (ULTRAM) 50 MG tablet   Oral   Take 1 tablet (50 mg total) by mouth every 6 (six) hours as needed.   120 tablet   0    BP 150/73  Pulse 82  Temp(Src) 98.8 F (37.1 C) (Oral)  Resp 20  Ht 5\' 7"  (1.702 m)  Wt 224 lb (101.606 kg)  BMI 35.08 kg/m2  SpO2 99%  Physical Exam  Nursing note and vitals reviewed. Constitutional: She is oriented to person, place, and time. She appears well-developed and well-nourished.  elderly  HENT:  Head: Normocephalic and atraumatic.  Right Ear: Tympanic membrane and ear canal normal.  Left Ear: Tympanic membrane and ear canal normal.  Nose: Nose normal.  Mouth/Throat: Uvula is midline and oropharynx is clear and moist. Mucous membranes are dry.  Mucus membranes dry  Eyes: Conjunctivae and EOM are normal. Pupils are equal, round, and reactive to light.  Neck: Normal range of motion.  Cardiovascular: Normal rate, regular rhythm and normal heart sounds.   Pulmonary/Chest: Effort normal and breath sounds normal.  Abdominal: Soft. Bowel sounds are normal. There  is no tenderness. There is no guarding.  Abdomen soft, nondistended, mild generalized  discomfort without focal tenderness to palpation  Musculoskeletal: Normal range of motion.  Neurological: She is alert and oriented to person, place, and time.  Skin: Skin is warm and dry.  Psychiatric: She has a normal mood and affect.    ED Course  Procedures (including critical care time) Labs Review Labs Reviewed  COMPREHENSIVE METABOLIC PANEL - Abnormal; Notable for the following:    Glucose, Bld 141 (*)    All other components within normal limits  CBC WITH DIFFERENTIAL - Abnormal; Notable for the following:    Platelets 147 (*)    Neutrophils Relative % 94 (*)    Lymphocytes Relative 4 (*)    Lymphs Abs 0.3 (*)    Monocytes Relative 2 (*)    All other components within normal limits  URINALYSIS, ROUTINE W REFLEX MICROSCOPIC - Abnormal; Notable for the following:    APPearance CLOUDY (*)    All other components within normal limits   Imaging Review Dg Abd Acute W/chest  07/28/2013   CLINICAL DATA:  Nausea vomiting, back pain  EXAM: ACUTE ABDOMEN SERIES (ABDOMEN 2 VIEW & CHEST 1 VIEW)  COMPARISON:  05/08/2013  FINDINGS: The heart and pulmonary vascularity are within normal limits. The thoracic aorta is tortuous but stable.  The abdomen shows scattered large and small bowel gas. No focal obstructive changes are seen. Changes of prior cholecystectomy are noted. Degenerative change of the lumbar spine is seen.  IMPRESSION: No acute abnormality identified.   Electronically Signed   By: Alcide Clever M.D.   On: 07/28/2013 13:38    EKG Interpretation   None       MDM   1. Nausea vomiting and diarrhea    On initial evaluation, pt lying comfortably in bed without active vomiting, NAD.  Lab work is reassuring.  Will initiate gentle fluids and anti-emetics.  Will obtain acute abd series.  Will continue to monitor and reassess.  Acute abdominal series negative for signs of obstruction or free air.  Patient has tolerated by mouth without recurrent vomiting. She has had no episodes of diarrhea in the ED.  Pt afebrile, non-toxic appearing, NAD, VS stable- ok for discharge.  Rx zofran.  FU with PCP.  Signs/sx that would warrant ED return including bloody diarrhea or emesis, uncontrollable vomiting, inability to hold down medications, feelings of dizziness/weakness, etc were discussed-- patient and family acknowledge understanding and agreed to plan of care.  Garlon Hatchet, PA-C 07/28/13 1507

## 2013-07-28 NOTE — ED Notes (Signed)
Pt reports onset diarrhea last night before bed then she woke this am with vomiting. shes had a headache and leg pain also. A&ox4, resp e/u

## 2013-07-31 ENCOUNTER — Telehealth: Payer: Self-pay | Admitting: Internal Medicine

## 2013-07-31 MED ORDER — FUROSEMIDE 20 MG PO TABS: 40.0000 mg | ORAL_TABLET | Freq: Every day | ORAL | Status: DC

## 2013-07-31 NOTE — Telephone Encounter (Signed)
Patient was seen in the emergency department on 07/28/2013 with nausea and vomiting.  I called her today to see how she was doing, and she reports that her symptoms have resolved.  At the time of her last visit on, I had increased her furosemide from a dose of 40 mg daily to a dose of 60 mg on Monday-Wednesday-Friday and 40 mg on other days because of some increase in her leg edema.  She reports that her leg edema has improved.  Given her recent nausea and vomiting, I advised her today to reduce her furosemide back to her previous dose of 40 mg daily.  I also advised her that she should be seen in the clinic within a week, and I will ask clinic schedulers to make her an appointment within that time frame.

## 2013-08-02 ENCOUNTER — Telehealth: Payer: Self-pay | Admitting: *Deleted

## 2013-08-02 NOTE — Telephone Encounter (Signed)
Pt reports that this am when she sit up on side of bed she had a "catch" in her R side of back, when she went to void it went away. Otherwise she is doing well, no N&V, fevers, diarrhea. appt is made for mon 2/16 am for f/u, she is agreeable, she is also cautioned that if she feels worse or does not feel comfortable waiting she may go to ED or ug care, she agrees

## 2013-08-03 NOTE — Telephone Encounter (Signed)
Agree with plan 

## 2013-08-06 ENCOUNTER — Encounter: Payer: Self-pay | Admitting: Internal Medicine

## 2013-08-06 ENCOUNTER — Other Ambulatory Visit: Payer: Self-pay | Admitting: Internal Medicine

## 2013-08-06 ENCOUNTER — Ambulatory Visit (INDEPENDENT_AMBULATORY_CARE_PROVIDER_SITE_OTHER): Payer: PRIVATE HEALTH INSURANCE | Admitting: Internal Medicine

## 2013-08-06 VITALS — BP 146/83 | HR 77 | Temp 97.3°F | Wt 222.8 lb

## 2013-08-06 DIAGNOSIS — R109 Unspecified abdominal pain: Secondary | ICD-10-CM

## 2013-08-06 DIAGNOSIS — R10A1 Flank pain, right side: Secondary | ICD-10-CM | POA: Insufficient documentation

## 2013-08-06 MED ORDER — IBUPROFEN 200 MG PO TABS: 600.0000 mg | ORAL_TABLET | Freq: Four times a day (QID) | ORAL | Status: DC | PRN

## 2013-08-06 NOTE — Patient Instructions (Signed)
Thank you for your visit.  I think you may have pulled a muscle on your right side. Please take the ibuprofen 600-800mg  every 6-8 hours for the next week. If you have no improvement of your symptoms within the next week or two, please return to our clinic. Alternatively, if you develop any shortness of breath or chest pain, please go to the emergency department.

## 2013-08-06 NOTE — Assessment & Plan Note (Signed)
Patient with c/o pain over R rib cage that is TTP and worse with movement. I think the most likely diagnosis is intercostal muscle strain. Pain is also pleuritic in nature, which initially made me concerned for PE. However, patient is not tachycardic (HR 77), her modified Geneva score based on HR <75 is 1 (low risk) and HR>75 is 4 (mod risk). Her hx is not concerning for PE as she has no hx of DVT/PE, malignancy, recent surgery, prolonged sitting. She is also not short of breath either with exertion or at rest. Her symptoms, hx, and physical exam are much more concerning for musculoskeletal etiology than PE. I am also less concerned for UTI/pyelonephritis as patient does not have any typical s/s such as dysuria, N/V, fever. PNA is a consideration given location of pain, though less likely as patient does not have cough, fever, sputum production or other URI type symptoms.  -ibuprofen 600-800mg  q6-8h for 1-2 weeks for presumed MSK etiology -patient instructed to return to clinic after 1-2 weeks if she has no improvement of her symptoms -patient instructed to go to the ED immediately if she experiences unilateral leg pain/swelling, shortness of breath, other chest pain

## 2013-08-06 NOTE — Progress Notes (Signed)
Patient ID: [name], female   DOB: 01-11-1941, 73 y.o.   MRN: [id number] HPI The patient is a 73 y.o. female with a history of HTN, HLD, DJD, SVT, diastolic dysfunction (grade 2), b/l leg edema who presents for an ED f/u visit.  Patient was evaluated in the ED on 2/7 for N/V/D. These symptoms have completely resolved. However, patient is here today for a different complaint. Patient reports having intermittent, sharp R sided flank pain x 2 weeks. The pain starts in R rib cage and radiates toward R back. The pain occurs only with certain movements and also worsens with deep breathing. The pain lasts only seconds and the resolves. She notices it occuring a few times per day for the last 2 weeks. She denies any recent injury, change in activity level, overhead work. She also denies any recent surgery, hx of malignancy of any kind, recent travel or sitting for long periods. Denies calf pain, cough, hemoptysis, SOB either at rest or with exertion, chest pain, dysuria, N/V/D, abd pain, ST. She has never had a PE/DVT, though does report remote hx of phlebitis in R leg.   ROS: General: no fevers, chills, changes in weight, changes in appetite Skin: no rash HEENT: no blurry vision, hearing changes, sore throat Pulm: no dyspnea, coughing, wheezing CV: +pain over R rib cage; no chest pain, palpitations, shortness of breath Abd: no abdominal pain, nausea/vomiting, diarrhea/constipation GU: no dysuria, hematuria, polyuria Ext: no arthralgias, myalgias Neuro: no new weakness, numbness, or tingling; she does have baseline L arm numbness/tingling from prior stroke  Filed Vitals:   08/06/13 0924  BP: 146/83  Pulse: 77  Temp: 97.3 F (36.3 C)    Physical Exam General: alert, cooperative, and in no apparent distress HEENT: pupils equal round and reactive to light, vision grossly intact, oropharynx clear and non-erythematous  Neck: supple Lungs: clear to ascultation bilaterally, normal work of  respiration, no wheezes, rales, ronchi; there is mild TTP over R rib cage (approx 6-7th rib) Heart: regular rate and rhythm, no murmurs, gallops, or rubs Abdomen: soft, non-tender, non-distended, normal bowel sounds Extremities: warm, visible varicosities b/l, 1+ BLE edema that is symmetric, no palpable cords or calf TTP Neurologic: alert & oriented X3, cranial nerves II-XII grossly intact, strength grossly intact, sensation intact to light touch  Current Outpatient Prescriptions on File Prior to Visit  Medication Sig Dispense Refill  . atenolol (TENORMIN) 50 MG tablet Take 50 mg by mouth daily.      [name] atorvastatin (LIPITOR) 40 MG tablet Take 1 tablet (40 mg total) by mouth daily at 6 PM.  30 tablet  5  . Cholecalciferol (VITAMIN D3) 1000 UNITS CAPS Take 3 capsules (3,000 Units total) by mouth daily.  90 capsule  6  . cloNIDine (CATAPRES) 0.1 MG tablet Take 1 tablet (0.1 mg total) by mouth daily.  30 tablet  9  . clopidogrel (PLAVIX) 75 MG tablet Take 1 tablet (75 mg total) by mouth daily with breakfast.  30 tablet  12  . Cyanocobalamin (VITAMIN B-12) 500 MCG SUBL Place 1 tablet under the tongue daily.       . digoxin (LANOXIN) 0.125 MG tablet Take 0.125 mg by mouth daily.      . furosemide (LASIX) 20 MG tablet Take 2 tablets (40 mg total) by mouth daily.  180 tablet  2  . potassium chloride SA (K-DUR,KLOR-CON) 20 MEQ tablet Take 1 tablet (20 mEq total) by mouth daily.  30 tablet  5  .  meloxicam (MOBIC) 15 MG tablet Take 15 mg by mouth daily as needed for pain.       [name]. ondansetron (ZOFRAN ODT) 4 MG disintegrating tablet Take 1 tablet (4 mg total) by mouth every 8 (eight) hours as needed for nausea.  12 tablet  0  . traMADol (ULTRAM) 50 MG tablet Take 1 tablet (50 mg total) by mouth every 6 (six) hours as needed.  120 tablet  0   No current facility-administered medications on file prior to visit.    Assessment/Plan

## 2013-08-15 ENCOUNTER — Ambulatory Visit (HOSPITAL_COMMUNITY)
Admission: RE | Admit: 2013-08-15 | Discharge: 2013-08-15 | Disposition: A | Discharge status: Home / Self Care | Payer: PRIVATE HEALTH INSURANCE | Source: Ambulatory Visit | Attending: Internal Medicine | Admitting: Internal Medicine

## 2013-08-15 ENCOUNTER — Other Ambulatory Visit: Payer: Self-pay | Admitting: Internal Medicine

## 2013-08-15 DIAGNOSIS — Z1239 Encounter for other screening for malignant neoplasm of breast: Secondary | ICD-10-CM

## 2013-08-15 DIAGNOSIS — Z1231 Encounter for screening mammogram for malignant neoplasm of breast: Secondary | ICD-10-CM | POA: Insufficient documentation

## 2013-09-03 ENCOUNTER — Other Ambulatory Visit: Payer: Self-pay | Admitting: Internal Medicine

## 2013-09-06 ENCOUNTER — Other Ambulatory Visit: Payer: Self-pay | Admitting: Internal Medicine

## 2013-11-16 ENCOUNTER — Encounter: Payer: Self-pay | Admitting: Nurse Practitioner

## 2013-11-16 ENCOUNTER — Other Ambulatory Visit: Payer: Self-pay | Admitting: Internal Medicine

## 2013-11-16 ENCOUNTER — Ambulatory Visit (INDEPENDENT_AMBULATORY_CARE_PROVIDER_SITE_OTHER): Payer: Medicare Other | Admitting: Nurse Practitioner

## 2013-11-16 VITALS — BP 120/73 | HR 71 | Ht 66.5 in | Wt 216.0 lb

## 2013-11-16 DIAGNOSIS — I6350 Cerebral infarction due to unspecified occlusion or stenosis of unspecified cerebral artery: Secondary | ICD-10-CM

## 2013-11-16 NOTE — Progress Notes (Signed)
PATIENT: [name] DOB: Sep 28, 1940  REASON FOR VISIT: routine follow up for stroke HISTORY FROM: patient  HISTORY OF PRESENT ILLNESS: 24 year African American lady seen for first office followup visit following hospital admission on 05/29/13 for stroke. She presented with sudden onset of left-sided numbness. She actually had a similar presentation 3 weeks ago in November and that time she is admitted fibrillation for stroke but no acute infarct and bilateral old basal ganglia and thalamic lacunar infarcts are noted. During the current admission she was found to have an acute left thalamic and right pontine infarct. Etiology was found to be small vessel disease. MRI of the brain showed no large vessel occlusion. Transthoracic echo showed normal ejection fraction without context of embolism. Lipid profile showed total cholesterol 205, triglycerides 68, HDL 59 and LDL 132 mg percent. She was started on atorvastatin and Plavix. She still should done well since discharge he still had some intermittent left face and hand tingling and numbness from time to time which is getting better but does not completely improve. She states her blood pressure is well controlled with a slightly elevated in office today. She is tolerating Plavix without bleeding or bruising as well as Lipitor without any side effects. She plans to go on diet, lose weight and exercise regularly. She has no new complaints today.   UPDATE 11/16/13 (LL): [name] returns to the office for stroke followup.  She has been doing well, no new neurovascular symptoms. Her blood pressure is well controlled, it is 120/73 in the office today.  She has an appt at her PCP in June to have blood work checked, will have lipids checked then.  She is tolerating statin and Plavix well without any known side effects.  Still having some intermittent hand tingling and numbness occasionally.  No new complaints.  REVIEW OF SYSTEMS: Full 14 system review of  systems performed and notable only for:  Joint swelling, appetite change   ALLERGIES: Allergies  Allergen Reactions  . Penicillins     REACTION: Local swelling in right arm after injection.  . Sulfonamide Derivatives     REACTION: Shortness of breath    HOME MEDICATIONS: Outpatient Prescriptions Prior to Visit  Medication Sig Dispense Refill  . atenolol (TENORMIN) 50 MG tablet Take 1 tablet (50 mg total) by mouth daily.  31 tablet  11  . atorvastatin (LIPITOR) 40 MG tablet Take 1 tablet (40 mg total) by mouth daily at 6 PM.  30 tablet  5  . Cholecalciferol (VITAMIN D3) 1000 UNITS CAPS Take 3 capsules (3,000 Units total) by mouth daily.  90 capsule  6  . cloNIDine (CATAPRES) 0.1 MG tablet Take 1 tablet (0.1 mg total) by mouth daily.  30 tablet  9  . clopidogrel (PLAVIX) 75 MG tablet Take 1 tablet (75 mg total) by mouth daily with breakfast.  30 tablet  12  . Cyanocobalamin (VITAMIN B-12) 500 MCG SUBL Place 1 tablet under the tongue daily.       . digoxin (LANOXIN) 0.125 MG tablet Take 1 tablet (0.125 mg total) by mouth daily.  30 tablet  11  . furosemide (LASIX) 20 MG tablet Take 2 tablets (40 mg total) by mouth daily.  180 tablet  2  . meloxicam (MOBIC) 15 MG tablet Take 15 mg by mouth daily as needed for pain.       [name] ondansetron (ZOFRAN ODT) 4 MG disintegrating tablet Take 1 tablet (4 mg total) by mouth every 8 (  eight) hours as needed for nausea.  12 tablet  0  . traMADol (ULTRAM) 50 MG tablet Take 1 tablet (50 mg total) by mouth every 6 (six) hours as needed.  120 tablet  0  . ibuprofen (ADVIL) 200 MG tablet Take 3-4 tablets (600-800 mg total) by mouth every 6 (six) hours as needed.  100 tablet  0  . potassium chloride SA (K-DUR,KLOR-CON) 20 MEQ tablet Take 1 tablet (20 mEq total) by mouth daily.  30 tablet  5   No facility-administered medications prior to visit.     PHYSICAL EXAM  Filed Vitals:   11/16/13 1335  BP: 120/73  Pulse: 71  Height: 5' 6.5" (1.689 m)  Weight:  216 lb (97.977 kg)   Body mass index is 34.35 kg/(m^2). No exam data present  Physical Exam  General: Obese middle-age African American lady not in distress, seated, in no evident distress  Head: head normocephalic and atraumatic. Orohparynx benign  Neck: supple with no carotid or supraclavicular bruits  Cardiovascular: regular rate and rhythm, no murmurs  Musculoskeletal: no deformity  Skin: no rash/petichiae  Vascular: Normal pulses all extremities   Neurologic Exam  Mental Status: Awake and fully alert. Oriented to place and time. Recent and remote memory intact. Attention span, concentration and fund of knowledge appropriate. Mood and affect appropriate.  Cranial Nerves: Fundoscopic exam reveals sharp disc margins. Pupils equal, briskly reactive to light. Extraocular movements full without nystagmus. Visual fields full to confrontation. Hearing intact. Facial sensation intact. Face, tongue, palate moves normally and symmetrically.  Motor: Normal bulk and tone. Normal strength in all tested extremity muscles. Diminished fine finger movements on the left. Orbits right over left upper extremity.  Sensory.: intact to touch and pinprick and vibratory sensation. Subjective paresthesias in the left arm and face but no objective sensory loss  Coordination: Rapid alternating movements normal in all extremities. Finger-to-nose and heel-to-shin performed accurately bilaterally.  Gait and Station: Arises from chair without difficulty. Stance is normal. Gait demonstrates normal stride length and balance . Able to heel, toe and tandem walk without difficulty.  Reflexes: 1+ and symmetric.   ASSESSMENT AND PLAN 73 year old AA female with right pontine and thalamic infarcts in December 2014 from small vessel disease with vascular risk factors of obesity, hypertension, hyperlipidemia and silent lacunar infarcts.   PLAN: I had a long discussion with the patient regarding her stroke, stroke risk factors,  risk factor modification and answered questions.  Continue Plavix for secondary stroke prevention with strict control of hypertension with blood pressure goal below 130/90 and lipids with LDL cholesterol goal below 100 mg percent.  Asked patient to have lipid panel results sent to our office next month. Return for followup in 6 months or call earlier if necessary.  Ronal FearLYNN E. LAM, MSN, NP-C 11/16/2013, 1:39 PM [facility] [address], [address] [place], [place] [address] [phone]  Note: This document was prepared with digital dictation and possible smart phrase technology. Any transcriptional errors that result from this process are unintentional.

## 2013-11-16 NOTE — Patient Instructions (Signed)
Continue Plavix for secondary stroke prevention with strict control of hypertension with blood pressure goal below 130/90 and lipids with LDL cholesterol goal below 100 mg percent. Return for followup in 6 months or call earlier if necessary.

## 2013-11-18 ENCOUNTER — Other Ambulatory Visit: Payer: Self-pay | Admitting: Internal Medicine

## 2013-11-18 ENCOUNTER — Other Ambulatory Visit (HOSPITAL_COMMUNITY): Payer: Self-pay | Admitting: Internal Medicine

## 2013-11-19 NOTE — Telephone Encounter (Signed)
This medication was refilled on 11/16/13.

## 2013-12-04 ENCOUNTER — Other Ambulatory Visit (HOSPITAL_COMMUNITY): Payer: Self-pay | Admitting: Internal Medicine

## 2013-12-05 ENCOUNTER — Ambulatory Visit (INDEPENDENT_AMBULATORY_CARE_PROVIDER_SITE_OTHER): Payer: PRIVATE HEALTH INSURANCE | Admitting: Internal Medicine

## 2013-12-05 ENCOUNTER — Encounter: Payer: Self-pay | Admitting: Internal Medicine

## 2013-12-05 VITALS — BP 125/75 | HR 67 | Temp 98.8°F | Ht 68.0 in | Wt 214.7 lb

## 2013-12-05 DIAGNOSIS — D696 Thrombocytopenia, unspecified: Secondary | ICD-10-CM

## 2013-12-05 DIAGNOSIS — E785 Hyperlipidemia, unspecified: Secondary | ICD-10-CM

## 2013-12-05 DIAGNOSIS — Z8673 Personal history of transient ischemic attack (TIA), and cerebral infarction without residual deficits: Secondary | ICD-10-CM

## 2013-12-05 DIAGNOSIS — I10 Essential (primary) hypertension: Secondary | ICD-10-CM

## 2013-12-05 DIAGNOSIS — I639 Cerebral infarction, unspecified: Secondary | ICD-10-CM

## 2013-12-05 DIAGNOSIS — R609 Edema, unspecified: Secondary | ICD-10-CM

## 2013-12-05 LAB — COMPLETE METABOLIC PANEL WITH GFR
ALK PHOS: 116 U/L (ref 39–117)
ALT: 12 U/L (ref 0–35)
AST: 12 U/L (ref 0–37)
Albumin: 4.1 g/dL (ref 3.5–5.2)
BUN: 12 mg/dL (ref 6–23)
CALCIUM: 9.5 mg/dL (ref 8.4–10.5)
CO2: 28 mEq/L (ref 19–32)
Chloride: 104 mEq/L (ref 96–112)
Creat: 0.51 mg/dL (ref 0.50–1.10)
GFR, Est African American: >89 mL/min
Glucose, Bld: 116 mg/dL — ABNORMAL HIGH (ref 70–99)
Potassium: 3.9 mEq/L (ref 3.5–5.3)
Sodium: 141 mEq/L (ref 135–145)
Total Bilirubin: 1.1 mg/dL (ref 0.2–1.2)
Total Protein: 6.3 g/dL (ref 6.0–8.3)

## 2013-12-05 LAB — LIPID PANEL
CHOL/HDL RATIO: 2.8 ratio
CHOLESTEROL: 156 mg/dL (ref 0–200)
HDL: 56 mg/dL (ref >39)
LDL Cholesterol: 89 mg/dL (ref 0–99)
TRIGLYCERIDES: 56 mg/dL (ref <150)
VLDL: 11 mg/dL (ref 0–40)

## 2013-12-05 LAB — CBC WITH DIFFERENTIAL/PLATELET
BASOS ABS: 0 10*3/uL (ref 0.0–0.1)
Basophils Relative: 0 % (ref 0–1)
EOS ABS: 0.1 10*3/uL (ref 0.0–0.7)
EOS PCT: 1 % (ref 0–5)
HCT: 34.9 % — ABNORMAL LOW (ref 36.0–46.0)
Hemoglobin: 11.7 g/dL — ABNORMAL LOW (ref 12.0–15.0)
LYMPHS ABS: 1.4 10*3/uL (ref 0.7–4.0)
Lymphocytes Relative: 27 % (ref 12–46)
MCH: 27.5 pg (ref 26.0–34.0)
MCHC: 33.5 g/dL (ref 30.0–36.0)
MCV: 82.1 fL (ref 78.0–100.0)
Monocytes Absolute: 0.3 10*3/uL (ref 0.1–1.0)
Monocytes Relative: 6 % (ref 3–12)
NEUTROS PCT: 66 % (ref 43–77)
Neutro Abs: 3.5 10*3/uL (ref 1.7–7.7)
PLATELETS: 170 10*3/uL (ref 150–400)
RBC: 4.25 MIL/uL (ref 3.87–5.11)
RDW: 14.2 % (ref 11.5–15.5)
WBC: 5.3 10*3/uL (ref 4.0–10.5)

## 2013-12-05 MED ORDER — FUROSEMIDE 20 MG PO TABS: 40.0000 mg | ORAL_TABLET | Freq: Every day | ORAL | Status: DC

## 2013-12-05 NOTE — Patient Instructions (Signed)
General Instructions: Take one extra furosemide 20 mg tablet up to two days per week if needed for leg swelling.    Progress Toward Treatment Goals:  Treatment Goal 12/05/2013  Blood pressure at goal    Self Care Goals & Plans:  Self Care Goal 12/05/2013  Manage my medications take my medicines as prescribed  Monitor my health -  Eat healthy foods eat foods that are low in salt; eat baked foods instead of fried foods; drink diet soda or water instead of juice or soda  Be physically active take a walk every day  Other water arobics  Meeting treatment goals maintain the current self-care plan    No flowsheet data found.   Care Management & Community Referrals:  Referral 12/05/2013  Referrals made for care management support -  Referrals made to community resources none

## 2013-12-05 NOTE — Progress Notes (Signed)
   Subjective:    Patient ID: [name], female    DOB: 08/21/1940, 73 y.o.   MRN: [id number]  HPI    Review of Systems  Constitutional: Negative for fever and chills.  Respiratory: Negative for shortness of breath.   Cardiovascular: Positive for leg swelling (Mild). Negative for chest pain.  Gastrointestinal: Negative for nausea, vomiting and abdominal pain.  Genitourinary: Negative for dysuria and frequency.  Neurological: Negative for dizziness, syncope, weakness and light-headedness.       Objective:   Physical Exam  Constitutional: No distress.  Cardiovascular: Normal rate, regular rhythm, S1 normal and S2 normal.  Exam reveals no S3 and no S4.   Murmur heard.  Systolic murmur is present with a grade of 2/6  Pulmonary/Chest: Effort normal and breath sounds normal. She has no wheezes. She has no rales.  Abdominal: Soft. Bowel sounds are normal. She exhibits no distension. There is no tenderness. There is no rebound and no guarding.  Musculoskeletal: She exhibits edema (Trace bilateral ankle edma).        Assessment & Plan:

## 2013-12-05 NOTE — Assessment & Plan Note (Signed)
Assessment: Patient has some residual leg edema, likely due to diastolic dysfunction, on furosemide 40 mg daily.  She previously reports nausea when she increased her dose to 60 mg on a daily basis.    Plan: I advised patient to take an extra furosemide 20 mg tablet for a total dose of 60 mg up to 2 days per week if needed for leg edema.

## 2013-12-05 NOTE — Assessment & Plan Note (Signed)
Lab Results  Component Value Date   WBC 7.2 07/28/2013   HGB 12.6 07/28/2013   HCT 37.6 07/28/2013   MCV 84.1 07/28/2013   PLT 147* 07/28/2013   Assessment: Patient's platelet count was mildly decreased when checked in February of this year.  Plan: Check CBC with differential today.

## 2013-12-05 NOTE — Assessment & Plan Note (Addendum)
Lipids:    Component Value Date/Time   CHOL 205* 05/09/2013 0108   TRIG 68 05/09/2013 0108   HDL 59 05/09/2013 0108   LDLCALC 132* 05/09/2013 0108   VLDL 14 05/09/2013 0108   CHOLHDL 3.5 05/09/2013 0108    Assessment: Patient was switched from pravastatin to atorvastatin during her hospitalization in November of 2014, and is doing well on atorvastatin with no apparent side effects.   Plan: Check lipid panel and comprehensive metabolic panel today (patient says she is fasting); continue atorvastatin 40 mg daily pending results.

## 2013-12-05 NOTE — Assessment & Plan Note (Addendum)
Assessment: Patient reports some residual subjective left arm numbness/tingling following her prior CVA in December of 2014, but she has no new complaints and is doing well on clopidogrel 75 mg daily.  She is followed by neurologist Dr. Pearlean BrownieSethi.  Plan: Continue clopidogrel 75 mg daily; risk factor management; follow up with neurologist Dr. Pearlean BrownieSethi in as scheduled.

## 2013-12-05 NOTE — Assessment & Plan Note (Signed)
BP Readings from Last 3 Encounters:  12/05/13 125/75  11/16/13 120/73  08/06/13 146/83    Lab Results  Component Value Date   NA 141 07/28/2013   K 3.8 07/28/2013   CREATININE 0.56 07/28/2013    Assessment: Blood pressure control: controlled Progress toward BP goal:  at goal Comments: Blood pressure is well controlled on atenolol 50 mg daily, clonidine 0.1 mg daily, and furosemide 40 mg daily  Plan: Medications:  continue current medications, and given patient's leg edema I advised her to take an extra furosemide 20 mg tablet up to 2 days per week if needed for leg swelling Educational resources provided: brochure

## 2013-12-06 ENCOUNTER — Other Ambulatory Visit: Payer: Self-pay | Admitting: Oncology

## 2013-12-16 ENCOUNTER — Other Ambulatory Visit: Payer: Self-pay | Admitting: Internal Medicine

## 2013-12-20 NOTE — Progress Notes (Signed)
Quick Note:  I called patient and discussed CMP results including glucose of 116 with her. She has had hyperglycemia on multiple occasions over the past few years; her hemoglobin A1c checked last year was normal. I advised her to avoid concentrated sweets, and that weight loss and walking as she is able for exercise may be beneficial. She knowledge that she likes cookies and candy bars and then she can cut those foods out on her diet. The plan is to recheck her glucose and hemoglobin A1c at next visit. ______

## 2013-12-20 NOTE — Progress Notes (Signed)
Quick Note:  I called patient and discussed the mildly low hemoglobin with her. She has had no symptoms to suggest blood loss or anemia. The plan is to recheck her hemoglobin at next visit. ______

## 2013-12-24 ENCOUNTER — Other Ambulatory Visit (HOSPITAL_COMMUNITY): Payer: Self-pay | Admitting: Internal Medicine

## 2014-02-13 ENCOUNTER — Telehealth: Payer: Self-pay | Admitting: Internal Medicine

## 2014-02-13 DIAGNOSIS — E559 Vitamin D deficiency, unspecified: Secondary | ICD-10-CM

## 2014-02-14 NOTE — Telephone Encounter (Signed)
Per dr Meredith Pel pt needs appt w/ him and lab appt very soon

## 2014-02-14 NOTE — Telephone Encounter (Signed)
Please call patient and have her come in for a vitamin D level to be drawn.  Also, please schedule an appointment with me when available.

## 2014-02-21 ENCOUNTER — Other Ambulatory Visit (HOSPITAL_COMMUNITY): Payer: Self-pay | Admitting: Internal Medicine

## 2014-02-27 ENCOUNTER — Other Ambulatory Visit (INDEPENDENT_AMBULATORY_CARE_PROVIDER_SITE_OTHER): Payer: PRIVATE HEALTH INSURANCE

## 2014-02-27 DIAGNOSIS — E559 Vitamin D deficiency, unspecified: Secondary | ICD-10-CM

## 2014-02-28 LAB — VITAMIN D 25 HYDROXY (VIT D DEFICIENCY, FRACTURES): VIT D 25 HYDROXY: 31 ng/mL (ref 30–89)

## 2014-03-06 ENCOUNTER — Telehealth: Payer: Self-pay | Admitting: Nurse Practitioner

## 2014-03-06 NOTE — Telephone Encounter (Signed)
Please advise patient that brusing can be a mild side effect from Plavix but the benefit of preventing stroke is worth it. She should make appt with her PCP for the blood in her urine.  This may be a sign of a more serious problem that has nothing to do with Plavix and needs to be addressed.

## 2014-03-06 NOTE — Telephone Encounter (Signed)
I called back and spoke with the patient.  Indicates she has developed a bruise on her upper left arm, and is concerned it may be due to Plavix.  She has not bumped into anything or done anything to cause this mark.  As well, she noticed blood in her urine about three weeks ago.  Says this has occurred twice.  States she does not have UTI and has had no symptoms prior to these incidents.  She would like a message sent to the provider regarding her concerns.  Please advise.  Thank you.

## 2014-03-06 NOTE — Telephone Encounter (Signed)
I called back.  Relayed Lynn's message.  Patient was agreeable, and will call us back if anything further is needed.

## 2014-03-06 NOTE — Telephone Encounter (Signed)
Patient noticed bruise on left upper arm and concerned this may be a side effect from taking clopidogrel (PLAVIX) 75 MG tablet.  Also noticed three weeks ago after urinating seeing blood in her urine.  Please call anytime and advise, may leave detailed message on vm.

## 2014-03-09 ENCOUNTER — Other Ambulatory Visit: Payer: Self-pay | Admitting: Internal Medicine

## 2014-03-09 DIAGNOSIS — I10 Essential (primary) hypertension: Secondary | ICD-10-CM

## 2014-04-03 ENCOUNTER — Telehealth: Payer: Self-pay | Admitting: *Deleted

## 2014-04-03 NOTE — Telephone Encounter (Signed)
Pt called asking for a referral to [facility]. She has a toenail that is bothering her with some bleeding around it.  Will you place the referral or do you want her to be seen in the clinic?

## 2014-04-04 NOTE — Telephone Encounter (Signed)
It would be best if she could be seen in our clinic this week for a brief visit to assess the problem, and the podiatry referral can then be made if indicated.

## 2014-04-08 ENCOUNTER — Encounter: Payer: Self-pay | Admitting: Internal Medicine

## 2014-04-08 ENCOUNTER — Ambulatory Visit (INDEPENDENT_AMBULATORY_CARE_PROVIDER_SITE_OTHER): Payer: PRIVATE HEALTH INSURANCE | Admitting: Internal Medicine

## 2014-04-08 VITALS — BP 132/68 | HR 67 | Temp 98.7°F | Ht 69.0 in | Wt 219.8 lb

## 2014-04-08 DIAGNOSIS — B351 Tinea unguium: Secondary | ICD-10-CM | POA: Insufficient documentation

## 2014-04-08 DIAGNOSIS — Z0000 Encounter for general adult medical examination without abnormal findings: Secondary | ICD-10-CM

## 2014-04-08 DIAGNOSIS — Z23 Encounter for immunization: Secondary | ICD-10-CM

## 2014-04-08 DIAGNOSIS — I10 Essential (primary) hypertension: Secondary | ICD-10-CM

## 2014-04-08 NOTE — Assessment & Plan Note (Signed)
Pain and loosened toe nail x1 week.  Currently pain only when moving the nail.  She has it bandaged which helps. No surrounding erythema, drainage, or major swelling but certainly tender to palpation. Toe nails are hardened and darkened.   -hx of prior right great toe avulsion for similar cause  -will refer to [facility] where she has been seen before for likely avulsion of nail, appt made for 945am tomorrow that she says she can attend

## 2014-04-08 NOTE — Progress Notes (Signed)
Subjective:   Patient ID: [name] female   DOB: 09/19/1940 73 y.o.   MRN: [id number]  HPI: [name] is a 73 y.o. female with PMH as listed below presenting to [facility] today for acute visit.   L great toe nail--hardened and loosened toe nail with pain x1 week. Similar presentation years ago of right great toe that was removed at the foot center.  No pain right now at rest but with palpation. No drainage or bleeding or erythema. Pain improved when held in place with bandages. Requesting referral to return to [facility].   Flu vaccine today.  Past Medical History  Diagnosis Date  . Hypertension   . Hyperlipemia   . DJD (degenerative joint disease)   . Chronic low back pain   . Abnormal electromyogram (EMG)     Low grade right S1 radiculopathy 7/93.  [name]. Hot flashes   . Rotator cuff syndrome of right shoulder     S/P right shoulder arthroscopic debridement of a massive rotator cuff tear, greater tuberosity and resection of subacromial spur by Dr. [name] 07/07/2005.  [name]. Herniated lumbar intervertebral disc     S/P L4-5 & L5-S1 laminotomy, foraminotomy, and L5-S1 diskectomy by Dr. [name] 11/01/2000.  . Multiple joint pain   . Iron deficiency anemia   . Vitamin D deficiency   . SVT (supraventricular tachycardia)     a. pt says she was told she had afib in the 90's and has been on digoxin since.  . Bilateral leg edema   . Peripheral neuropathy   . Osteopenia   . Seasonal allergic rhinitis   . Elevated alkaline phosphatase level     Bone scan 08/2004 showed no evidence of abnormal bony activity..  . Skin tag     Right axillary area.  . Hypokalemia   . Headache(784.0)   . Diastolic dysfunction     a. 08/2012 Echo: EF 60-65%, Gr 2 DD, triv MR, PASP 44mmHg.  [name]. Left arm numbness     ? 04/2013  . Chest pain     a. reports nl cath in the 1990's.  . Stroke    Current Outpatient Prescriptions  Medication Sig Dispense Refill  . atenolol (TENORMIN) 50 MG tablet Take  1 tablet (50 mg total) by mouth daily.  31 tablet  11  . atorvastatin (LIPITOR) 40 MG tablet Take 1 tablet (40 mg total) by mouth daily at 6 PM.  30 tablet  5  . Cholecalciferol ([employer] VITAMIN D3) 1000 UNITS capsule Take 3 capsules (3,000 Units total) by mouth daily.  90 capsule  1  . cloNIDine (CATAPRES) 0.1 MG tablet Take 1 tablet (0.1 mg total) by mouth daily.  30 tablet  11  . clopidogrel (PLAVIX) 75 MG tablet Take 1 tablet (75 mg total) by mouth daily with breakfast.  30 tablet  12  . Cyanocobalamin (VITAMIN B-12) 500 MCG SUBL Place 1 tablet under the tongue daily.       . digoxin (LANOXIN) 0.125 MG tablet Take 1 tablet (0.125 mg total) by mouth daily.  30 tablet  11  . furosemide (LASIX) 20 MG tablet Take 2 tablets (40 mg total) by mouth daily. Take one extra tablet up to two days per week if needed for leg swelling.  190 tablet  2  . KLOR-CON M20 20 MEQ tablet TAKE 1 TABLET (20 MEQ TOTAL) BY MOUTH DAILY.  30 tablet  2  . meloxicam (MOBIC) 15 MG tablet Take  15 mg by mouth daily as needed for pain.       [name]. ondansetron (ZOFRAN ODT) 4 MG disintegrating tablet Take 1 tablet (4 mg total) by mouth every 8 (eight) hours as needed for nausea.  12 tablet  0   No current facility-administered medications for this visit.   Family History  Problem Relation Age of Onset  . Ovarian cancer Mother 870  . Hypertension Mother   . Heart attack Mother [id number]  . Hypertension Sister   . Hypertension Sister   . Breast cancer Neg Hx   . Colon cancer Neg Hx   . Lung cancer Neg Hx   . Cancer Brother   . Neuropathy Sister    History   Social History  . Marital Status: Single    Spouse Name: N/A    Number of Children: 6  . Years of Education: 121th   Occupational History  . retired    Social History Main Topics  . Smoking status: Former Smoker -- 0.15 packs/day for 2 years    Quit date: 10/07/1999  . Smokeless tobacco: Never Used  . Alcohol Use: No  . Drug Use: No  . Sexual Activity: None   Other  Topics Concern  . None   Social History Narrative  . None   Review of Systems:  Constitutional:  Denies fever, chills  Respiratory:  Denies SOB  Cardiovascular:  Denies chest pain  Gastrointestinal:  Denies nausea, vomiting, abdominal pain   Musculoskeletal:  Hardened toe nails and darkened  Skin:  Denies rash  Neurological:  Denies syncope   Objective:  Physical Exam: Filed Vitals:   04/08/14 1505  BP: 132/68  Pulse: 67  Temp: 98.7 F (37.1 C)  TempSrc: Oral  Height: 5\' 9"  (1.753 m)  Weight: 219 lb 12.8 oz (99.701 kg)  SpO2: 99%   Vitals reviewed. General: sitting in chair, NAD HEENT: EOMI Cardiac: RRR Pulm: clear to auscultation bilaterally Abd: soft, nontender, BS present Ext: +1 pitting edema b/l, missing right great toe nail, hardened and darkened toe nails, left great toe loosened darkened and hardened toe nail with white underneath and on margins, pain on manipulation. Moving all 4 extremities Neuro: alert and oriented X3  Assessment & Plan:  Discussed with Dr. Josem KaufmannKlima L great toe onychomycosis--[facility] appt 10/20

## 2014-04-08 NOTE — Assessment & Plan Note (Signed)
BP Readings from Last 3 Encounters:  04/08/14 132/68  12/05/13 125/75  11/16/13 120/73   Lab Results  Component Value Date   NA 141 12/05/2013   K 3.9 12/05/2013   CREATININE 0.51 12/05/2013   Assessment: Blood pressure control: controlled Progress toward BP goal:  at goal  Plan: Medications:  continue current medications clonidine, atenolol, digoxin, and lasix Educational resources provided:   Self management tools provided:   Other plans: did not take lasix today, will do so when she gets home

## 2014-04-08 NOTE — Assessment & Plan Note (Signed)
Flu vaccine given today. 

## 2014-04-08 NOTE — Patient Instructions (Addendum)
General Instructions:  Please go see Dr. Al CorpusHyatt tomorrow morning at [facility] 04/09/14 at 930am.   If you are unable to make that appointment please call them Address: [address], [place], [place] [address] [phone] and also let us know  Please keep your appointment with Dr. Meredith PelJoines on 11/11  Please bring your medicines with you each time you come to clinic.  Medicines may include prescription medications, over-the-counter medications, herbal remedies, eye drops, vitamins, or other pills.   Progress Toward Treatment Goals:  Treatment Goal 04/08/2014  Blood pressure at goal    Self Care Goals & Plans:  Self Care Goal 04/08/2014  Manage my medications take my medicines as prescribed; bring my medications to every visit; refill my medications on time  Monitor my health -  Eat healthy foods eat more vegetables; eat foods that are low in salt  Be physically active -  Other -  Meeting treatment goals -    No flowsheet data found.   Care Management & Community Referrals:  Referral 12/05/2013  Referrals made for care management support -  Referrals made to community resources none     Onychomycosis/Fungal Toenails  WHAT IS IT? An infection that lies within the keratin of your nail plate that is caused by a fungus.  WHY ME? Fungal infections affect all ages, sexes, races, and creeds.  There may be many factors that predispose you to a fungal infection such as age, coexisting medical conditions such as diabetes, or an autoimmune disease; stress, medications, fatigue, genetics, etc.  Bottom line: fungus thrives in a warm, moist environment and your shoes offer such a location.  IS IT CONTAGIOUS? Theoretically, yes.  You do not want to share shoes, nail clippers or files with someone who has fungal toenails.  Walking around barefoot in the same room or sleeping in the same bed is unlikely to transfer the organism.  It is important to realize, however, that fungus can  spread easily from one nail to the next on the same foot.  HOW DO WE TREAT THIS?  There are several ways to treat this condition.  Treatment may depend on many factors such as age, medications, pregnancy, liver and kidney conditions, etc.  It is best to ask your doctor which options are available to you.  1. No treatment.   Unlike many other medical concerns, you can live with this condition.  However for many people this can be a painful condition and may lead to ingrown toenails or a bacterial infection.  It is recommended that you keep the nails cut short to help reduce the amount of fungal nail. 2. Topical treatment.  These range from herbal remedies to prescription strength nail lacquers.  About 40-50% effective, topicals require twice daily application for approximately 9 to 12 months or until an entirely new nail has grown out.  The most effective topicals are medical grade medications available through physicians offices. 3. Oral antifungal medications.  With an 80-90% cure rate, the most common oral medication requires 3 to 4 months of therapy and stays in your system for a year as the new nail grows out.  Oral antifungal medications do require blood work to make sure it is a safe drug for you.  A liver function panel will be performed prior to starting the medication and after the first month of treatment.  It is important to have the blood work performed to avoid any harmful side effects.  In general, this medication safe  but blood work is required. 4. Laser Therapy.  This treatment is performed by applying a specialized laser to the affected nail plate.  This therapy is noninvasive, fast, and non-painful.  It is not covered by insurance and is therefore, out of pocket.  The results have been very good with a 80-95% cure rate.  The [facility] is the only practice in the area to offer this therapy. 5. Permanent Nail Avulsion.  Removing the entire nail so that a new nail will not grow  back.

## 2014-04-09 ENCOUNTER — Ambulatory Visit: Payer: Medicare Other | Admitting: Podiatry

## 2014-04-09 NOTE — Addendum Note (Signed)
Addended by: Doneen PoissonKLIMA, Davarious Tumbleson D on: 04/09/2014 09:11 AM   Modules accepted: Level of Service

## 2014-04-09 NOTE — Progress Notes (Signed)
Case discussed with Dr. Qureshi soon after the resident saw the patient.  We reviewed the resident's history and exam and pertinent patient test results.  I agree with the assessment, diagnosis, and plan of care documented in the resident's note. 

## 2014-04-10 ENCOUNTER — Other Ambulatory Visit: Payer: Self-pay | Admitting: Internal Medicine

## 2014-04-10 MED ORDER — CHOLECALCIFEROL 25 MCG (1000 UT) PO CAPS: 1000.0000 [IU] | ORAL_CAPSULE | Freq: Every day | ORAL | Status: DC

## 2014-04-10 NOTE — Telephone Encounter (Signed)
Lab Results  Component Value Date   VD25OH 31 02/27/2014     Since her last vitamin D level was 31, plan is to reduce the Vitamin D3 dose from 3,000 units daily to 1,000 units daily.  Please call patient and instruct her to reduce the dose to one capsule daily.

## 2014-04-11 ENCOUNTER — Encounter: Payer: Self-pay | Admitting: Podiatry

## 2014-04-11 ENCOUNTER — Ambulatory Visit: Payer: Medicare Other | Admitting: Podiatry

## 2014-04-11 ENCOUNTER — Ambulatory Visit (INDEPENDENT_AMBULATORY_CARE_PROVIDER_SITE_OTHER): Payer: Medicare Other | Admitting: Podiatry

## 2014-04-11 VITALS — BP 121/63 | HR 69 | Resp 16

## 2014-04-11 DIAGNOSIS — L03032 Cellulitis of left toe: Secondary | ICD-10-CM

## 2014-04-11 NOTE — Patient Instructions (Signed)

## 2014-04-12 DIAGNOSIS — L03032 Cellulitis of left toe: Secondary | ICD-10-CM

## 2014-04-12 NOTE — Progress Notes (Signed)
She presents today stating that she has pain to the hallux left. She states the toenail seems to be coming off and she's ready to have it removed. She does not want it removed permanently she only able to have it removed for now and let it grow back.  Objective: She denies fever chills nausea vomiting muscle aches and pains vital signs are stable she is alert and oriented x3 no change in her past medical history medications or allergies. Pulses remain palpable bilateral. Hallux nail left does demonstrate distal onychomycosis to the level of approximately the lunula. I see no signs of infection other than mild malodor.  Assessment: Pain in limb secondary to painful hallux nail left mild paronychia hallux left.  Plan: Total nail avulsion hallux left was performed after 3 mL of a 50-50 mixture of Marcaine plain and lidocaine plain were infiltrated in a hallux block left. Once the nail plate was avulsed it was evident that she did have a small granuloma present at the central aspect of the nailbed which is causing the malodor and possibly loosening of the nail. The area was covered with Betadine ointment and Telfa pad and a dressed a compressive dressing. I will followup with her in the near future. She will start soaking twice daily in Betadine in warm water and cover.

## 2014-04-18 ENCOUNTER — Ambulatory Visit: Payer: Medicare Other | Admitting: Podiatry

## 2014-04-25 ENCOUNTER — Encounter: Payer: Self-pay | Admitting: Podiatry

## 2014-04-25 ENCOUNTER — Ambulatory Visit (INDEPENDENT_AMBULATORY_CARE_PROVIDER_SITE_OTHER): Payer: Medicare Other | Admitting: Podiatry

## 2014-04-25 DIAGNOSIS — Z9889 Other specified postprocedural states: Secondary | ICD-10-CM

## 2014-04-25 DIAGNOSIS — L03032 Cellulitis of left toe: Secondary | ICD-10-CM

## 2014-04-26 NOTE — Progress Notes (Signed)
She presents today status post nail avulsion hallux left. She denies fever chills nausea vomiting muscle aches and pains and continues to soak.  Objective: No erythema edema cellulitis drainage or odor the toe appears to be healing normally.  Assessment: well-healing surgical toe left.  Plan: Continue his soaks until completely resolved follow up with me should complications occur.

## 2014-05-01 ENCOUNTER — Encounter: Payer: Self-pay | Admitting: Internal Medicine

## 2014-05-01 ENCOUNTER — Telehealth: Payer: Self-pay | Admitting: Neurology

## 2014-05-01 ENCOUNTER — Ambulatory Visit (INDEPENDENT_AMBULATORY_CARE_PROVIDER_SITE_OTHER): Payer: PRIVATE HEALTH INSURANCE | Admitting: Internal Medicine

## 2014-05-01 VITALS — BP 139/67 | HR 71 | Temp 98.2°F | Ht 69.0 in | Wt 218.5 lb

## 2014-05-01 DIAGNOSIS — D649 Anemia, unspecified: Secondary | ICD-10-CM | POA: Insufficient documentation

## 2014-05-01 DIAGNOSIS — E785 Hyperlipidemia, unspecified: Secondary | ICD-10-CM

## 2014-05-01 DIAGNOSIS — R739 Hyperglycemia, unspecified: Secondary | ICD-10-CM

## 2014-05-01 DIAGNOSIS — I10 Essential (primary) hypertension: Secondary | ICD-10-CM

## 2014-05-01 DIAGNOSIS — R609 Edema, unspecified: Secondary | ICD-10-CM

## 2014-05-01 DIAGNOSIS — Z0000 Encounter for general adult medical examination without abnormal findings: Secondary | ICD-10-CM

## 2014-05-01 HISTORY — DX: Anemia, unspecified: D64.9

## 2014-05-01 LAB — BASIC METABOLIC PANEL WITH GFR
BUN: 15 mg/dL (ref 6–23)
CO2: 26 mEq/L (ref 19–32)
Calcium: 9.4 mg/dL (ref 8.4–10.5)
Chloride: 105 mEq/L (ref 96–112)
Creat: 0.64 mg/dL (ref 0.50–1.10)
GFR, EST NON AFRICAN AMERICAN: 89 mL/min
Glucose, Bld: 91 mg/dL (ref 70–99)
POTASSIUM: 3.9 meq/L (ref 3.5–5.3)
Sodium: 141 mEq/L (ref 135–145)

## 2014-05-01 LAB — CBC WITH DIFFERENTIAL/PLATELET
BASOS ABS: 0 10*3/uL (ref 0.0–0.1)
BASOS PCT: 0 % (ref 0–1)
Eosinophils Absolute: 0.1 10*3/uL (ref 0.0–0.7)
Eosinophils Relative: 1 % (ref 0–5)
HCT: 35.3 % — ABNORMAL LOW (ref 36.0–46.0)
Hemoglobin: 11.8 g/dL — ABNORMAL LOW (ref 12.0–15.0)
Lymphocytes Relative: 38 % (ref 12–46)
Lymphs Abs: 2.1 10*3/uL (ref 0.7–4.0)
MCH: 27.7 pg (ref 26.0–34.0)
MCHC: 33.4 g/dL (ref 30.0–36.0)
MCV: 82.9 fL (ref 78.0–100.0)
Monocytes Absolute: 0.3 10*3/uL (ref 0.1–1.0)
Monocytes Relative: 6 % (ref 3–12)
NEUTROS PCT: 55 % (ref 43–77)
Neutro Abs: 3 10*3/uL (ref 1.7–7.7)
PLATELETS: 152 10*3/uL (ref 150–400)
RBC: 4.26 MIL/uL (ref 3.87–5.11)
RDW: 13.7 % (ref 11.5–15.5)
WBC: 5.4 10*3/uL (ref 4.0–10.5)

## 2014-05-01 LAB — GLUCOSE, CAPILLARY: GLUCOSE-CAPILLARY: 92 mg/dL (ref 70–99)

## 2014-05-01 LAB — POCT GLYCOSYLATED HEMOGLOBIN (HGB A1C): Hemoglobin A1C: 5.4

## 2014-05-01 MED ORDER — ZOSTER VACCINE LIVE 19400 UNT/0.65ML ~~LOC~~ SOLR: 0.6500 mL | Freq: Once | SUBCUTANEOUS | Status: DC

## 2014-05-01 MED ORDER — FUROSEMIDE 20 MG PO TABS: ORAL_TABLET | ORAL | Status: DC

## 2014-05-01 NOTE — Telephone Encounter (Signed)
Left message for patient regarding rescheduling 05/20/14 appointment per Larita FifeLynn leaving, rescheduled to Dr. Marlis EdelsonSethi's very next available on 07/23/14.

## 2014-05-01 NOTE — Assessment & Plan Note (Signed)
Lab Results  Component Value Date   HGB 11.7* 12/05/2013   HGB 12.6 07/28/2013   HGB 12.0 05/30/2013     Assessment: Patient had mildly low hemoglobin when checked in June.  She has no symptoms of anemia.  Plan: Check a CBC today.

## 2014-05-01 NOTE — Patient Instructions (Signed)
Increase furosemide (Lasix) 20 mg to a dose of three tablets each Monday, Wednesday, and Friday, and two tablets on other days.

## 2014-05-01 NOTE — Progress Notes (Signed)
   Subjective:    Patient ID: [name], female    DOB: September 19, 1940, 73 y.o.   MRN: [id number]  HPI Patient returns for management of her hypertension, hyperlipidemia, and other chronic medical problems.  She is increased bilateral leg swelling; otherwise she is doing well without any acute complaints.  She saw her podiatrist since her last visit here and had her left great toenail removed; she reports that it is healing well without problems.  She reports that she has been compliant with her medications.   Review of Systems  Constitutional: Negative for fever, chills, diaphoresis and appetite change.  Respiratory: Negative for cough, shortness of breath and wheezing.   Cardiovascular: Negative for chest pain.  Gastrointestinal: Negative for nausea, vomiting, abdominal pain, blood in stool and anal bleeding.  Endocrine: Negative for polydipsia, polyphagia and polyuria.  Genitourinary: Negative for dysuria, frequency and difficulty urinating.  Musculoskeletal: Positive for arthralgias (Occasional right knee pain).  Neurological: Positive for numbness (Chronic numbness fingers of left hand following stroke). Negative for dizziness, syncope and weakness.  Psychiatric/Behavioral: Negative for dysphoric mood.    I reviewed and updated the medication list, allergies, past medical history, past surgical history, family history, and social history.     Objective:   Physical Exam  Constitutional: No distress.  Cardiovascular: Normal rate, regular rhythm and normal heart sounds.  Exam reveals no gallop and no friction rub.   No murmur heard. 2+ bilateral pitting leg edema  Pulmonary/Chest: Effort normal and breath sounds normal. No respiratory distress. She has no wheezes. She has no rales. She exhibits no tenderness.  Abdominal: Soft. Bowel sounds are normal. She exhibits no distension. There is no tenderness. There is no rebound and no guarding.        Assessment & Plan:

## 2014-05-01 NOTE — Assessment & Plan Note (Signed)
I discussed the risks/benefits of the Zostavax with patient, and provided a prescription as well as the vaccine information statement.

## 2014-05-01 NOTE — Assessment & Plan Note (Addendum)
Assessment: Patient has increased bilateral leg edema; she had normal left ventricular systolic function and moderate diastolic dysfunction on a 2-D echocardiogram done 09/15/2012.  She has been taking furosemide 40 mg daily.    Plan: Plan is to increase furosemide to a dose of 60 mg on Monday, Wednesday, and Friday, and 40 mg on other days.  I advised patient to call if she develops any symptoms of dizziness or other problems following this increase.

## 2014-05-01 NOTE — Assessment & Plan Note (Signed)
BP Readings from Last 3 Encounters:  05/01/14 139/67  04/11/14 121/63  04/08/14 132/68    Lab Results  Component Value Date   NA 141 12/05/2013   K 3.9 12/05/2013   CREATININE 0.51 12/05/2013    Assessment: Blood pressure control: controlled Progress toward BP goal:  at goal Comments: blood pressure is controlled on atenolol 50 mg daily, clonidine 0.1 mg daily, and furosemide 40 mg daily.  However, patient does have increased bilateral leg edema likely related to her diastolic heart failure.  Plan: Medications:  Increase furosemide to a dose of 60 mg on Monday, Wednesday and Friday, and 40 mg on other days

## 2014-05-01 NOTE — Assessment & Plan Note (Signed)
Lab Results  Component Value Date   GLUCOSE 116* 12/05/2013   GLUCOSE 141* 07/28/2013   GLUCOSE 91 07/25/2013     Lab Results  Component Value Date   HGBA1C 5.4 05/01/2014   HGBA1C 5.3 05/09/2013   HGBA1C 5.4 12/13/2012    Assessment: Patient has had mild hyperglycemia on several past random labs.  However, her hemoglobin A1c is consistently normal.  Plan: Continue dietary and lifestyle management.

## 2014-05-01 NOTE — Assessment & Plan Note (Signed)
Lipids:    Component Value Date/Time   CHOL 156 12/05/2013 1120   TRIG 56 12/05/2013 1120   HDL 56 12/05/2013 1120   LDLCALC 89 12/05/2013 1120   VLDL 11 12/05/2013 1120   CHOLHDL 2.8 12/05/2013 1120    Assessment: Patient is doing well on atorvastatin with no apparent side effects; LDL is at goal.   Plan: Continue atorvastatin 40 mg daily.

## 2014-05-07 ENCOUNTER — Other Ambulatory Visit: Payer: Self-pay | Admitting: Internal Medicine

## 2014-05-20 ENCOUNTER — Ambulatory Visit: Payer: Medicare Other | Admitting: Nurse Practitioner

## 2014-06-09 ENCOUNTER — Other Ambulatory Visit: Payer: Self-pay | Admitting: Internal Medicine

## 2014-06-15 ENCOUNTER — Other Ambulatory Visit: Payer: Self-pay | Admitting: Internal Medicine

## 2014-06-17 NOTE — Telephone Encounter (Signed)
Please schedule an appointment within 1 month [facility]

## 2014-06-26 ENCOUNTER — Other Ambulatory Visit: Payer: Self-pay | Admitting: Internal Medicine

## 2014-06-27 ENCOUNTER — Other Ambulatory Visit: Payer: Self-pay | Admitting: Internal Medicine

## 2014-06-27 DIAGNOSIS — E559 Vitamin D deficiency, unspecified: Secondary | ICD-10-CM

## 2014-06-27 DIAGNOSIS — Z79899 Other long term (current) drug therapy: Secondary | ICD-10-CM

## 2014-07-01 ENCOUNTER — Other Ambulatory Visit: Payer: Medicaid Other

## 2014-07-05 ENCOUNTER — Other Ambulatory Visit: Payer: Medicaid Other

## 2014-07-09 ENCOUNTER — Other Ambulatory Visit (INDEPENDENT_AMBULATORY_CARE_PROVIDER_SITE_OTHER): Payer: Medicare Other

## 2014-07-09 DIAGNOSIS — E559 Vitamin D deficiency, unspecified: Secondary | ICD-10-CM

## 2014-07-09 DIAGNOSIS — Z79899 Other long term (current) drug therapy: Secondary | ICD-10-CM

## 2014-07-09 LAB — BASIC METABOLIC PANEL WITH GFR
BUN: 18 mg/dL (ref 6–23)
CO2: 29 mEq/L (ref 19–32)
Calcium: 9.8 mg/dL (ref 8.4–10.5)
Chloride: 106 mEq/L (ref 96–112)
Creat: 0.64 mg/dL (ref 0.50–1.10)
GFR, EST NON AFRICAN AMERICAN: 89 mL/min
GFR, Est African American: >89 mL/min
GLUCOSE: 90 mg/dL (ref 70–99)
POTASSIUM: 4.4 meq/L (ref 3.5–5.3)
Sodium: 140 mEq/L (ref 135–145)

## 2014-07-10 LAB — VITAMIN D 25 HYDROXY (VIT D DEFICIENCY, FRACTURES): Vit D, 25-Hydroxy: 22 ng/mL — ABNORMAL LOW (ref 30–100)

## 2014-07-14 ENCOUNTER — Other Ambulatory Visit: Payer: Self-pay | Admitting: Internal Medicine

## 2014-07-23 ENCOUNTER — Encounter: Payer: Self-pay | Admitting: Neurology

## 2014-07-23 ENCOUNTER — Ambulatory Visit (INDEPENDENT_AMBULATORY_CARE_PROVIDER_SITE_OTHER): Payer: Medicare Other | Admitting: Neurology

## 2014-07-23 VITALS — BP 126/78 | HR 73 | Ht 69.0 in | Wt 223.2 lb

## 2014-07-23 DIAGNOSIS — I6990 Unspecified sequelae of unspecified cerebrovascular disease: Secondary | ICD-10-CM

## 2014-07-23 NOTE — Progress Notes (Signed)
PATIENT: [name] DOB: 1941-04-14  REASON FOR VISIT: routine follow up for stroke HISTORY FROM: patient  HISTORY OF PRESENT ILLNESS: 74 year African American lady seen for first office followup visit following hospital admission on 05/29/13 for stroke. She presented with sudden onset of left-sided numbness. She actually had a similar presentation 3 weeks ago in November and that time she is admitted fibrillation for stroke but no acute infarct and bilateral old basal ganglia and thalamic lacunar infarcts are noted. During the current admission she was found to have an acute left thalamic and right pontine infarct. Etiology was found to be small vessel disease. MRI of the brain showed no large vessel occlusion. Transthoracic echo showed normal ejection fraction without context of embolism. Lipid profile showed total cholesterol 205, triglycerides 68, HDL 59 and LDL 132 mg percent. She was started on atorvastatin and Plavix. She still should done well since discharge he still had some intermittent left face and hand tingling and numbness from time to time which is getting better but does not completely improve. She states her blood pressure is well controlled with a slightly elevated in office today. She is tolerating Plavix without bleeding or bruising as well as Lipitor without any side effects. She plans to go on diet, lose weight and exercise regularly. She has no new complaints today.   UPDATE 11/16/13 (LL): [name] returns to the office for stroke followup.  She has been doing well, no new neurovascular symptoms. Her blood pressure is well controlled, it is 120/73 in the office today.  She has an appt at her PCP in June to have blood work checked, will have lipids checked then.  She is tolerating statin and Plavix well without any known side effects.  Still having some intermittent hand tingling and numbness occasionally.  No new complaints. Update 07/23/2014 : She returns for follow-up  after last visit 7 months ago. She continues to do well without recurrent stroke or TIA symptoms. She at has some intermittent numbness in the left hand particularly when she is tired but it is not bothersome. She is tolerating Plavix well without significant bleeding, bruising or other side effects. She states she had lab work done in the primary care physician's office but I do not have the results for my review today. Her blood pressure is quite good and today it is 122/73 in office. She has some walking difficulties mainly related to her right knee pain. No other new neurological issues. REVIEW OF SYSTEMS: Full 14 system review of systems performed and notable only for:  Ringing in the ears, leg swelling, back pain, numbness and all other systems negative ALLERGIES: Allergies  Allergen Reactions  . Penicillins     REACTION: Local swelling in right arm after injection.  . Sulfonamide Derivatives     REACTION: Shortness of breath    HOME MEDICATIONS: Outpatient Prescriptions Prior to Visit  Medication Sig Dispense Refill  . atenolol (TENORMIN) 50 MG tablet Take 1 tablet (50 mg total) by mouth daily. 31 tablet 11  . atorvastatin (LIPITOR) 40 MG tablet Take 1 tablet (40 mg total) by mouth daily. 30 tablet 5  . Cholecalciferol ([employer] D3) 1000 UNITS capsule Take 1 capsule (1,000 Units total) by mouth daily. 90 capsule 0  . cloNIDine (CATAPRES) 0.1 MG tablet Take 1 tablet (0.1 mg total) by mouth daily. 30 tablet 11  . clopidogrel (PLAVIX) 75 MG tablet Take 1 tablet (75 mg total) by mouth daily. 30 tablet 5  .  Cyanocobalamin (VITAMIN B-12) 500 MCG SUBL Place 1 tablet under the tongue daily.     . digoxin (LANOXIN) 0.125 MG tablet Take 1 tablet (0.125 mg total) by mouth daily. 30 tablet 11  . furosemide (LASIX) 20 MG tablet Take three tablets each day on Monday, Wednesday, and Friday; take two tablets daily on other days. 235 tablet 0  . meloxicam (MOBIC) 15 MG tablet Take 15 mg by mouth daily as  needed for pain.     . potassium chloride SA (KLOR-CON M20) 20 MEQ tablet Take 1 tablet (20 mEq total) by mouth daily. 30 tablet 3  . zoster vaccine live, PF, (ZOSTAVAX) 16109 UNT/0.65ML injection Inject 19,400 Units into the skin once. 1 each 0   No facility-administered medications prior to visit.     PHYSICAL EXAM  Filed Vitals:   07/23/14 1407  BP: 126/78  Pulse: 73  Height:  (1.753 m)  Weight: 223 lb 3.2 oz (101.243 kg)   Body mass index is 32.95 kg/(m^2). No exam data present  Physical Exam  General: Obese middle-age African American lady not in distress, seated, in no evident distress  Head: head normocephalic and atraumatic. Orohparynx benign  Neck: supple with no carotid or supraclavicular bruits  Cardiovascular: regular rate and rhythm, no murmurs  Musculoskeletal: no deformity  Skin: no rash/petichiae  Vascular: Normal pulses all extremities   Neurologic Exam  Mental Status: Awake and fully alert. Oriented to place and time. Recent and remote memory intact. Attention span, concentration and fund of knowledge appropriate. Mood and affect appropriate.  Cranial Nerves: Fundoscopic exam reveals sharp disc margins. Pupils equal, briskly reactive to light. Extraocular movements full without nystagmus. Visual fields full to confrontation. Hearing intact. Facial sensation intact. Face, tongue, palate moves normally and symmetrically.  Motor: Normal bulk and tone. Normal strength in all tested extremity muscles. Diminished fine finger movements on the left. Orbits right over left upper extremity.  Sensory.: intact to touch and pinprick and vibratory sensation. Subjective paresthesias in the left arm   but no objective sensory loss  Coordination: Rapid alternating movements normal in all extremities. Finger-to-nose and heel-to-shin performed accurately bilaterally.  Gait and Station: Arises from chair without difficulty. Stance is normal. Gait demonstrates normal stride  length and balance . Able to heel, toe and tandem walk without difficulty.  Reflexes: 1+ and symmetric.   ASSESSMENT AND PLAN 74 year old AA female with right pontine and thalamic infarcts in December 2014 from small vessel disease with vascular risk factors of obesity, hypertension, hyperlipidemia and silent lacunar infarcts.   PLAN:I had a long d/w patient about her remote stroke, risk for recurrent stroke/TIAs, personally independently reviewed imaging studies and stroke evaluation results and answered questions.Continue clopidogrel 75 mg orally every day  for secondary stroke prevention and maintain strict control of hypertension with blood pressure goal below 130/90, diabetes with hemoglobin A1c goal below 6.5% and lipids with LDL cholesterol goal below 100 mg/dL. I also advised the patient to eat a healthy diet with plenty of whole grains, cereals, fruits and vegetables, exercise regularly and maintain ideal body weight . Check screening carotid dopplers.Followup in the future with Butch Penny, NP in 6 months.   Delia Heady, MD  07/23/2014, 2:37 PM [facility] [address], [address] [place], [place] [address] [phone]  Note: This document was prepared with digital dictation and possible smart phrase technology. Any transcriptional errors that result from this process are unintentional.

## 2014-07-23 NOTE — Patient Instructions (Signed)
I had a long d/w patient about her remote stroke, risk for recurrent stroke/TIAs, personally independently reviewed imaging studies and stroke evaluation results and answered questions.Continue clopidogrel 75 mg orally every day  for secondary stroke prevention and maintain strict control of hypertension with blood pressure goal below 130/90, diabetes with hemoglobin A1c goal below 6.5% and lipids with LDL cholesterol goal below 100 mg/dL. I also advised the patient to eat a healthy diet with plenty of whole grains, cereals, fruits and vegetables, exercise regularly and maintain ideal body weight . Check screening carotid dopplers.Followup in the future with Butch PennyMegan Millikan, NP in 6 months.

## 2014-08-01 ENCOUNTER — Other Ambulatory Visit: Payer: Medicare Other

## 2014-08-01 ENCOUNTER — Telehealth: Payer: Self-pay | Admitting: Radiology

## 2014-08-01 NOTE — Telephone Encounter (Signed)
Patient called and left message at 1:35pm  to say she would not be here for her 1:30 doppler appointment today.  I returned the call and left a message to call me back for rescheduling her carotid doppler.

## 2014-08-07 ENCOUNTER — Ambulatory Visit (INDEPENDENT_AMBULATORY_CARE_PROVIDER_SITE_OTHER): Payer: Medicare Other | Admitting: Internal Medicine

## 2014-08-07 ENCOUNTER — Encounter: Payer: Self-pay | Admitting: Internal Medicine

## 2014-08-07 VITALS — BP 137/71 | HR 79 | Temp 98.2°F | Wt 224.0 lb

## 2014-08-07 DIAGNOSIS — I10 Essential (primary) hypertension: Secondary | ICD-10-CM | POA: Diagnosis not present

## 2014-08-07 DIAGNOSIS — Z8673 Personal history of transient ischemic attack (TIA), and cerebral infarction without residual deficits: Secondary | ICD-10-CM | POA: Diagnosis not present

## 2014-08-07 DIAGNOSIS — I5032 Chronic diastolic (congestive) heart failure: Secondary | ICD-10-CM

## 2014-08-07 DIAGNOSIS — R600 Localized edema: Secondary | ICD-10-CM | POA: Diagnosis not present

## 2014-08-07 DIAGNOSIS — Z0000 Encounter for general adult medical examination without abnormal findings: Secondary | ICD-10-CM

## 2014-08-07 DIAGNOSIS — E559 Vitamin D deficiency, unspecified: Secondary | ICD-10-CM | POA: Diagnosis not present

## 2014-08-07 DIAGNOSIS — M8580 Other specified disorders of bone density and structure, unspecified site: Secondary | ICD-10-CM | POA: Diagnosis not present

## 2014-08-07 DIAGNOSIS — Z8679 Personal history of other diseases of the circulatory system: Secondary | ICD-10-CM

## 2014-08-07 DIAGNOSIS — Z23 Encounter for immunization: Secondary | ICD-10-CM

## 2014-08-07 DIAGNOSIS — E785 Hyperlipidemia, unspecified: Secondary | ICD-10-CM | POA: Diagnosis not present

## 2014-08-07 DIAGNOSIS — R609 Edema, unspecified: Secondary | ICD-10-CM | POA: Diagnosis not present

## 2014-08-07 DIAGNOSIS — Z1231 Encounter for screening mammogram for malignant neoplasm of breast: Secondary | ICD-10-CM

## 2014-08-07 LAB — BASIC METABOLIC PANEL WITH GFR
BUN: 15 mg/dL (ref 6–23)
CHLORIDE: 104 meq/L (ref 96–112)
CO2: 29 mEq/L (ref 19–32)
Calcium: 9.4 mg/dL (ref 8.4–10.5)
Creat: 0.54 mg/dL (ref 0.50–1.10)
GLUCOSE: 135 mg/dL — AB (ref 70–99)
POTASSIUM: 3.6 meq/L (ref 3.5–5.3)
SODIUM: 140 meq/L (ref 135–145)

## 2014-08-07 MED ORDER — CALCIUM-VITAMIN D3 600-400 MG-UNIT PO TABS: 1.0000 | ORAL_TABLET | Freq: Two times a day (BID) | ORAL | Status: DC

## 2014-08-07 MED ORDER — FUROSEMIDE 20 MG PO TABS: 60.0000 mg | ORAL_TABLET | Freq: Every day | ORAL | Status: DC

## 2014-08-07 NOTE — Patient Instructions (Signed)
Increase furosemide 20 mg tablets to a dose of 3 tablets daily. Weigh daily and call if your weight changes more than 5 pounds in either direction from baseline. Start calcium 600 mg-vitamin D3 400 units 1 tablet twice a day. A referral has been made for screening mammogram. A referral has been made for a DEXA bone density scan.

## 2014-08-07 NOTE — Assessment & Plan Note (Signed)
Assessment: Patient reports no new symptoms since her prior CVA in December 2014.  She is taking clopidogrel 75 mg daily.  She recently saw Dr. Pearlean BrownieSethi on 07/23/2014, and he ordered screening carotid Dopplers.  Plan: Continue clopidogrel 75 mg daily and management of her risk factors.  Follow-up with Dr. Pearlean BrownieSethi as per his recommendations.

## 2014-08-07 NOTE — Assessment & Plan Note (Signed)
Assessment: Patient had normal left ventricular systolic function and moderate diastolic dysfunction on a 2-D echocardiogram done 09/15/2012.  Her bilateral leg edema improved somewhat following the increase in her furosemide dose to a dose of 60 mg on Monday-Wednesday-Friday and 40 mg on other days.  However, she still has persisting leg edema. .    Plan: Increase furosemide to a dose of 60 mg daily.  I advised patient weight daily and to call if her weight changes more than 5 pounds in either direction.  Will check a basic metabolic panel today and have her return in 2 weeks for reassessment and repeat labs.

## 2014-08-07 NOTE — Assessment & Plan Note (Signed)
Lipids:    Component Value Date/Time   CHOL 156 12/05/2013 1120   TRIG 56 12/05/2013 1120   HDL 56 12/05/2013 1120   LDLCALC 89 12/05/2013 1120   VLDL 11 12/05/2013 1120   CHOLHDL 2.8 12/05/2013 1120    Assessment: Patient is doing well on atorvastatin with no apparent side effects; LDL was at goal when checked in June 2015   Plan: Continue atorvastatin 40 mg daily.  Will need a repeat lipid panel in 3-4 months.

## 2014-08-07 NOTE — Assessment & Plan Note (Signed)
Assessment: A DEXA bone density scan done 02/13/2009 showed a left femur neck young adult T-score of -1.6 (osteopenia), a right femur neck young adult T-score of -1.6 (osteopenia), and an AP spine young adult T-score of 1.3 (normal).  Her FRAX score gave an estimated 10 year probability of 6.6% for major osteoporotic fracture and 0.8% for hip fracture.  Patient has been taking vitamin D supplementation, but has not been taking calcium recently.  Plan: Order a repeat DEXA bone density scan.  Start calcium carbonate 600 mg-vitamin D3 400 units twice a day; continue vitamin D3 1000 units 1 capsule daily.

## 2014-08-07 NOTE — Progress Notes (Signed)
   Subjective:    Patient ID: [name], female    DOB: 1941-03-15, 74 y.o.   MRN: [id number]  HPI Patient returns for management of her hypertension, bilateral leg edema, hyperlipidemia, and other chronic medical problems.  She has no acute complaints today, and reports that she has been doing well.  She reports some improvement in her bilateral leg edema following her last visit, at which time we increased her furosemide dose.  She has some episodic right knee pain which is chronic and stable.  She also has chronic low back pain when walking which is nonradiating.  Following her prior stroke, she reports some residual mild left hand numbness and left facial numbness, but she has had no new symptoms.  She reports that she has been compliant with her medications.  She reports only one transient episode of palpitations since her last visit which lasted a few seconds.  Review of Systems  Constitutional: Negative for fever, chills and diaphoresis.  Respiratory: Negative for cough, shortness of breath and wheezing.   Cardiovascular: Positive for leg swelling (Better on increased furosemide dose). Negative for chest pain.  Gastrointestinal: Negative for nausea, vomiting, abdominal pain, blood in stool and anal bleeding.  Endocrine: Negative for polydipsia, polyphagia and polyuria.  Genitourinary: Negative for dysuria and frequency.  Musculoskeletal: Positive for back pain (Chronic low back pain when walking, nonradiating.) and arthralgias (Right knee, chronic).  Neurological: Negative for dizziness, syncope and weakness.    I reviewed and updated the medication list, allergies, past medical history, past surgical history, family history, and social history.     Objective:   Physical Exam  Constitutional: No distress.  Cardiovascular: Normal rate, regular rhythm and normal heart sounds.  Exam reveals no gallop and no friction rub.   No murmur heard. 1+ bilateral ankle edema  Pulmonary/Chest:  Effort normal and breath sounds normal. No respiratory distress. She has no wheezes. She has no rales.  Abdominal: Soft. Bowel sounds are normal. She exhibits no distension. There is no hepatosplenomegaly. There is no tenderness. There is no rebound and no guarding.  Neurological: She has normal strength.  Distracted straight leg raise negative bilaterally        Assessment & Plan:

## 2014-08-07 NOTE — Assessment & Plan Note (Signed)
BP Readings from Last 3 Encounters:  08/07/14 137/71  07/23/14 126/78  05/01/14 139/67    Lab Results  Component Value Date   NA 140 07/09/2014   K 4.4 07/09/2014   CREATININE 0.64 07/09/2014    Assessment: Blood pressure control: controlled Progress toward BP goal:  at goal Comments: Blood pressure is controlled on atenolol 50 mg daily, furosemide 60 mg on Monday-Wednesday-Friday and 40 mg on other days, and clonidine 0.1 mg daily.  However, given her bilateral leg edema which is likely due to chronic diastolic heart failure, the plan is to increase her furosemide dose to a dose of 60 mg daily.  Plan: Medications:  Increase furosemide to a dose of 60 mg daily Other plans: I advised patient to weigh daily and call if her weight changes more than 5 pounds in either direction.  Will check a basic metabolic panel today, and have patient return in 2 weeks for reassessment and repeat labs.

## 2014-08-07 NOTE — Assessment & Plan Note (Signed)
A screening mammogram was ordered today.  Prevnar vaccine was given in the clinic.

## 2014-08-07 NOTE — Assessment & Plan Note (Addendum)
Assessment: Patient has a remote history of PSVT and has been on digoxin and atenolol for years.  She reports a single episode of transient palpitations lasting a few seconds since her last visit here; otherwise she is completely asymptomatic.  Plan: Continue digoxin and atenolol.  I corrected patient's past medical history, which raised the question of a history of paroxysmal atrial fibrillation.  I am not aware of any history of paroxysmal atrial fibrillation.

## 2014-08-07 NOTE — Assessment & Plan Note (Signed)
Lab Results  Component Value Date   VD25OH 22* 07/09/2014   VD25OH 31 02/27/2014   VD25OH 18* 12/13/2012     Assessment: Patient's vitamin D level is again low despite replacement with vitamin D3 1000 units daily.  Patient reports that she is compliant with that medication.  She has not recently been taking a calcium supplement.  Plan: Continue vitamin D3 1000 units 1 capsule daily.  Start calcium carbonate 600 mg-vitamin D3 400 units one tablet twice a day.

## 2014-08-08 ENCOUNTER — Ambulatory Visit (INDEPENDENT_AMBULATORY_CARE_PROVIDER_SITE_OTHER): Payer: 59

## 2014-08-08 DIAGNOSIS — I6990 Unspecified sequelae of unspecified cerebrovascular disease: Secondary | ICD-10-CM

## 2014-08-08 NOTE — Progress Notes (Signed)
Quick Note:  Blood glucose is somewhat elevated; hemoglobin A1c was 5.4 in November. Plan is continue dietary and lifestyle management; her glucose and hemoglobin A1c will need to be periodically monitored. ______

## 2014-08-11 ENCOUNTER — Other Ambulatory Visit: Payer: Self-pay | Admitting: Internal Medicine

## 2014-08-22 ENCOUNTER — Encounter: Payer: Self-pay | Admitting: Internal Medicine

## 2014-08-22 ENCOUNTER — Ambulatory Visit (INDEPENDENT_AMBULATORY_CARE_PROVIDER_SITE_OTHER): Payer: Medicare Other | Admitting: Internal Medicine

## 2014-08-22 VITALS — BP 134/74 | HR 75 | Temp 98.5°F | Wt 222.9 lb

## 2014-08-22 DIAGNOSIS — E785 Hyperlipidemia, unspecified: Secondary | ICD-10-CM | POA: Diagnosis not present

## 2014-08-22 DIAGNOSIS — R739 Hyperglycemia, unspecified: Secondary | ICD-10-CM

## 2014-08-22 DIAGNOSIS — D649 Anemia, unspecified: Secondary | ICD-10-CM

## 2014-08-22 DIAGNOSIS — I10 Essential (primary) hypertension: Secondary | ICD-10-CM | POA: Diagnosis not present

## 2014-08-22 DIAGNOSIS — Z8679 Personal history of other diseases of the circulatory system: Secondary | ICD-10-CM

## 2014-08-22 DIAGNOSIS — Z79899 Other long term (current) drug therapy: Secondary | ICD-10-CM

## 2014-08-22 DIAGNOSIS — R609 Edema, unspecified: Secondary | ICD-10-CM

## 2014-08-22 DIAGNOSIS — Z5181 Encounter for therapeutic drug level monitoring: Secondary | ICD-10-CM | POA: Diagnosis not present

## 2014-08-22 LAB — CBC WITH DIFFERENTIAL/PLATELET
Basophils Absolute: 0 10*3/uL (ref 0.0–0.1)
Basophils Relative: 0 % (ref 0–1)
EOS PCT: 2 % (ref 0–5)
Eosinophils Absolute: 0.1 10*3/uL (ref 0.0–0.7)
HEMATOCRIT: 35.9 % — AB (ref 36.0–46.0)
HEMOGLOBIN: 11.4 g/dL — AB (ref 12.0–15.0)
LYMPHS ABS: 1.3 10*3/uL (ref 0.7–4.0)
LYMPHS PCT: 30 % (ref 12–46)
MCH: 27.1 pg (ref 26.0–34.0)
MCHC: 31.8 g/dL (ref 30.0–36.0)
MCV: 85.5 fL (ref 78.0–100.0)
MONO ABS: 0.2 10*3/uL (ref 0.1–1.0)
MONOS PCT: 5 % (ref 3–12)
MPV: 10.1 fL (ref 8.6–12.4)
Neutro Abs: 2.8 10*3/uL (ref 1.7–7.7)
Neutrophils Relative %: 63 % (ref 43–77)
Platelets: 157 10*3/uL (ref 150–400)
RBC: 4.2 MIL/uL (ref 3.87–5.11)
RDW: 14.3 % (ref 11.5–15.5)
WBC: 4.4 10*3/uL (ref 4.0–10.5)

## 2014-08-22 LAB — LIPID PANEL
CHOLESTEROL: 145 mg/dL (ref 0–200)
HDL: 57 mg/dL (ref >=46)
LDL Cholesterol: 80 mg/dL (ref 0–99)
Total CHOL/HDL Ratio: 2.5 Ratio
Triglycerides: 41 mg/dL (ref <150)
VLDL: 8 mg/dL (ref 0–40)

## 2014-08-22 LAB — COMPLETE METABOLIC PANEL WITH GFR
ALK PHOS: 118 U/L — AB (ref 39–117)
ALT: 10 U/L (ref 0–35)
AST: 13 U/L (ref 0–37)
Albumin: 3.9 g/dL (ref 3.5–5.2)
BILIRUBIN TOTAL: 0.8 mg/dL (ref 0.2–1.2)
BUN: 17 mg/dL (ref 6–23)
CO2: 29 mEq/L (ref 19–32)
Calcium: 9.8 mg/dL (ref 8.4–10.5)
Chloride: 105 mEq/L (ref 96–112)
Creat: 0.66 mg/dL (ref 0.50–1.10)
GFR, Est Non African American: 88 mL/min
Glucose, Bld: 89 mg/dL (ref 70–99)
POTASSIUM: 4.5 meq/L (ref 3.5–5.3)
Sodium: 140 mEq/L (ref 135–145)
Total Protein: 6 g/dL (ref 6.0–8.3)

## 2014-08-22 LAB — POCT GLYCOSYLATED HEMOGLOBIN (HGB A1C): Hemoglobin A1C: 5.4

## 2014-08-22 LAB — FERRITIN: Ferritin: 68 ng/mL (ref 10–291)

## 2014-08-22 LAB — GLUCOSE, CAPILLARY: GLUCOSE-CAPILLARY: 89 mg/dL (ref 70–99)

## 2014-08-22 NOTE — Assessment & Plan Note (Signed)
Lab Results  Component Value Date   HGBA1C 5.4 08/22/2014   HGBA1C 5.4 05/01/2014   HGBA1C 5.3 05/09/2013    Lab Results  Component Value Date   GLUCAP 89 08/22/2014   GLUCAP 92 05/01/2014   GLUCAP 117* 05/29/2013    Lab Results  Component Value Date   GLUCOSE 135* 08/07/2014   GLUCOSE 90 07/09/2014   GLUCOSE 91 05/01/2014     Assessment: Patient has had occasional elevated blood glucose measurements; her hemoglobin A1c remains normal.  She has no symptoms of diabetes.  I discussed this with her today, and [facility]  I also advised that her blood glucose and hemoglobin A1c be periodically rechecked.  [facility]  Periodic recheck of hemoglobin A1c and blood glucose.

## 2014-08-22 NOTE — Assessment & Plan Note (Signed)
Wt Readings from Last 3 Encounters:  08/22/14 222 lb 14.4 oz (101.107 kg)  08/07/14 224 lb (101.606 kg)  07/23/14 223 lb 3.2 oz (101.243 kg)    Assessment: Patient reports improvement in her bilateral leg edema on furosemide 60 mg daily.  She has some leg edema on exam today, but she reports complete resolution when she takes her furosemide in the morning.  I discussed with her the option of further increasing the furosemide, but she prefers to maintain the current dose for now given her improvement.  Plan: Continue furosemide 60 mg daily; check a metabolic panel today.

## 2014-08-22 NOTE — Assessment & Plan Note (Signed)
Lipids:    Component Value Date/Time   CHOL 156 12/05/2013 1120   TRIG 56 12/05/2013 1120   HDL 56 12/05/2013 1120   LDLCALC 89 12/05/2013 1120   VLDL 11 12/05/2013 1120   CHOLHDL 2.8 12/05/2013 1120    Assessment: Patient is doing well on atorvastatin 40 mg daily with no apparent side effects.  Plan: Check a lipid panel today; continue atorvastatin 40 mg daily pending the result.

## 2014-08-22 NOTE — Progress Notes (Signed)
   Subjective:    Patient ID: [name], female    DOB: [birth year], 74 y.o.   MRN: [id number]  HPI He returns for management and follow-up of her bilateral leg edema due to diastolic dysfunction, hypertension, hyperglycemia, hyperlipidemia, and anemia.  At the time of her last visit 08/07/2014, I increased her furosemide to a dose of 60 mg daily.  She reports that she has done well since the increase, and reports improvement in her leg edema.  She reports that if she takes the furosemide in the morning, the edema goes away completely.  Today she has no acute complaints.     Review of Systems  Respiratory: Negative for shortness of breath.   Cardiovascular: Positive for leg swelling (Improved on furosemide 60 mg daily). Negative for chest pain and palpitations.  Gastrointestinal: Negative for nausea, vomiting and abdominal pain.  Neurological: Negative for dizziness.       Objective:   Physical Exam  Constitutional: No distress.  Cardiovascular: Normal rate and regular rhythm.  Exam reveals no gallop and no friction rub.   No murmur heard. 1+ bilateral leg edema  Pulmonary/Chest: Effort normal and breath sounds normal. No respiratory distress. She has no wheezes. She has no rales.  Abdominal: Soft. Bowel sounds are normal. She exhibits no distension. There is no tenderness. There is no rebound and no guarding.       Assessment & Plan:

## 2014-08-22 NOTE — Assessment & Plan Note (Signed)
Lab Results  Component Value Date   HGB 11.8* 05/01/2014   HGB 11.7* 12/05/2013   HGB 12.6 07/28/2013     Lab Results  Component Value Date   MCV 82.9 05/01/2014   MCV 82.1 12/05/2013   MCV 84.1 07/28/2013     Assessment: Patient has a mild normocytic anemia.  This is asymptomatic.  Plan: Check a CBC and ferritin level today.

## 2014-08-22 NOTE — Assessment & Plan Note (Signed)
BP Readings from Last 3 Encounters:  08/22/14 134/74  08/07/14 137/71  07/23/14 126/78    Lab Results  Component Value Date   NA 140 08/07/2014   K 3.6 08/07/2014   CREATININE 0.54 08/07/2014    Assessment: Blood pressure control: controlled Progress toward BP goal:  at goal Comments: Blood pressure is controlled on atenolol 50 mg daily, clonidine 0.1 mg daily, and furosemide 60 mg daily.  Plan: Medications:  continue current medications

## 2014-08-22 NOTE — Assessment & Plan Note (Signed)
Assessment: Patient has a remote history of PSVT and has been on digoxin and atenolol for years.  She denies any recent palpitations.  Plan: Continue digoxin and atenolol.  Check a digoxin level today.

## 2014-08-22 NOTE — Patient Instructions (Addendum)
Continue current medications. Please schedule an appointment with [facility] for [facility].

## 2014-08-23 LAB — DIGOXIN LEVEL: DIGOXIN LVL: 0.9 ng/mL (ref 0.8–2.0)

## 2014-08-27 ENCOUNTER — Ambulatory Visit (HOSPITAL_COMMUNITY): Payer: Medicare Other

## 2014-08-30 NOTE — Progress Notes (Signed)
Quick Note:  I called patient and discussed results with her. Her LDL is at goal. She has a minimal alkaline phosphatase elevation and a mild anemia which are stable; I recommended periodic follow-up of these findings. ______

## 2014-09-04 ENCOUNTER — Ambulatory Visit (INDEPENDENT_AMBULATORY_CARE_PROVIDER_SITE_OTHER): Payer: Medicare Other | Admitting: Dietician

## 2014-09-04 DIAGNOSIS — D649 Anemia, unspecified: Secondary | ICD-10-CM | POA: Diagnosis not present

## 2014-09-04 DIAGNOSIS — R739 Hyperglycemia, unspecified: Secondary | ICD-10-CM | POA: Diagnosis not present

## 2014-09-04 DIAGNOSIS — Z5181 Encounter for therapeutic drug level monitoring: Secondary | ICD-10-CM

## 2014-09-04 DIAGNOSIS — I10 Essential (primary) hypertension: Secondary | ICD-10-CM | POA: Diagnosis not present

## 2014-09-04 DIAGNOSIS — Z8679 Personal history of other diseases of the circulatory system: Secondary | ICD-10-CM

## 2014-09-04 DIAGNOSIS — E785 Hyperlipidemia, unspecified: Secondary | ICD-10-CM | POA: Diagnosis not present

## 2014-09-04 DIAGNOSIS — R609 Edema, unspecified: Secondary | ICD-10-CM | POA: Diagnosis not present

## 2014-09-04 NOTE — Patient Instructions (Addendum)
[facility] to [facility] to find out where you can use the Silver Sneakers card.  You said you want to try walking with the group in your neighborhood next Wednesday.   Consider drinking homemade tea, tap water, milk  instead of Koolaid, regular or diet sodasoda.  Use tap water instead of bottled water. Drink for glass glasses or refillable water contianers Saves $$ and is better for you!  Microwave in glass or ceramic dishes instead of plastic containers.   IF you want to have a cookie/sweet- try dried fruit like prunes, dates, or raisins instead of candy or cookies, fig newtons, graham crackers, vanilla wafers or ginger snaps  Make a  Follow up in 3 weeks around April 7th.

## 2014-09-04 NOTE — Progress Notes (Signed)
  Medical Nutrition Therapy:  Appt start time: 1030 end time:  1140.  Assessment:  Primary concerns today: weight and healthy diet to prevent diabetes and help keep cholesterol at good level  Preferred Learning Style: No preference indicated  Learning Readiness: Contemplating/Ready/Change in progress MEDICATIONS: noted- says one is making her sleepy during the day. Gave her pharmacist card to discuss which one and maybe taking HTN at bedtime would help her control DIETARY INTAKE: Usual eating pattern includes 3 meals and 1-2 snacks per day. Everyday foods include grits.  Avoided foods include pork, green peppers, regular milk,  trying to cut down non cookies, candy and fried foods.   24-hr recall: arises ~ 6 AM B ( 10 AM): Cereal, lactaid milk and fruit (frosted flakes, wheat or corn chex, raisin bran, honey nut cheerios L ( 2 PM): leftovers or sandwich Snk ( PM): fruit or cookies D ( 6 PM): baked fish, corn, vegetables- mostly frozen Snk ( PM): fruit or cookies Beverages: lactaid milk, juice, koolaid and diet soda, some coffee  Usual physical activity: cleans house one room at a time, she did not say, but sus[ect mostly sedentary activity/time  Estimated energy needs: 1500-1600calories 170-180 g carbohydrates 80 g protein  Progress Towards Goal(s):  In progress.   Nutritional Diagnosis:  NB-1.1 Food and nutrition-related knowledge deficit As related to lack of prior nutrition cousneling and education.  As evidenced by her report and lack of nutrition knowledge in some areas..    Intervention:  Nutrition education about balanced diet, increasing fiber/whole grains, decreasing sodium/sugar and increasing nutrient density as well as goal setting. Teaching Method Utilized: Visual,,Auditory,Hands on Handouts given during visit include: Fiber, AVS, Plate method of meal planning Barriers to learning/adherence to lifestyle change: knee pain, transportation  Demonstrated degree of  understanding via:  Teach Back   Monitoring/Evaluation:  Dietary intake, exercise, and body weight in 3 week(s).

## 2014-09-05 ENCOUNTER — Other Ambulatory Visit: Payer: Self-pay | Admitting: Internal Medicine

## 2014-09-05 MED ORDER — POTASSIUM CHLORIDE CRYS ER 20 MEQ PO TBCR: 20.0000 meq | EXTENDED_RELEASE_TABLET | Freq: Every day | ORAL | Status: DC

## 2014-09-06 ENCOUNTER — Ambulatory Visit (HOSPITAL_COMMUNITY)
Admission: RE | Admit: 2014-09-06 | Discharge: 2014-09-06 | Disposition: A | Discharge status: Home / Self Care | Payer: Medicare Other | Source: Ambulatory Visit | Attending: Internal Medicine | Admitting: Internal Medicine

## 2014-09-06 ENCOUNTER — Encounter: Payer: Self-pay | Admitting: *Deleted

## 2014-09-06 DIAGNOSIS — M8589 Other specified disorders of bone density and structure, multiple sites: Secondary | ICD-10-CM | POA: Diagnosis not present

## 2014-09-06 DIAGNOSIS — Z13820 Encounter for screening for osteoporosis: Secondary | ICD-10-CM | POA: Insufficient documentation

## 2014-09-06 DIAGNOSIS — Z780 Asymptomatic menopausal state: Secondary | ICD-10-CM | POA: Diagnosis not present

## 2014-09-06 DIAGNOSIS — M8580 Other specified disorders of bone density and structure, unspecified site: Secondary | ICD-10-CM

## 2014-09-06 DIAGNOSIS — Z1231 Encounter for screening mammogram for malignant neoplasm of breast: Secondary | ICD-10-CM | POA: Diagnosis not present

## 2014-09-12 ENCOUNTER — Telehealth: Payer: Self-pay | Admitting: Internal Medicine

## 2014-09-12 DIAGNOSIS — M8580 Other specified disorders of bone density and structure, unspecified site: Secondary | ICD-10-CM

## 2014-09-12 MED ORDER — CHOLECALCIFEROL 25 MCG (1000 UT) PO CAPS: 2000.0000 [IU] | ORAL_CAPSULE | Freq: Every day | ORAL | Status: DC

## 2014-09-12 NOTE — Assessment & Plan Note (Signed)
Telephone Contact Note  I called patient to discuss her DEXA scan results and the management of her osteopenia.  The DXA scan 09/06/2014 showed a decrease in her lumbar spine bone density (T-score of 0.4 compared to 1.3 on 02/13/2009, both normal), and a decrease in her right femur bone density (T-score -2.1 compared to -1.6 on 02/13/2009, both osteopenic.)  Her FRAX results showed a 4.1% 10-year probability of hip fracture and an 18.8% 10-year probability of major osteoporotic fracture.  The hip fracture risk is above the current recommended threshold of 3% for consideration of pharmacologic therapy in patients with osteopenia.  I discussed this with patient, and I discussed the benefits and potential side effects of bisphosphonate therapy.  Her last vitamin D level was low, and this should be corrected before starting a bisphosphonate if she chooses pharmacologic treatment.  I advised her to increase her vitamin D3 from 1000 units daily to a dose of 2000 units daily, and to continue her calcium with vitamin D twice a day.  I recommended that she return in early June for follow-up including a vitamin D level and for further discussion of whether to start bisphosphonate therapy.

## 2014-09-12 NOTE — Progress Notes (Signed)
Quick Note:  I called patient to discuss her DEXA scan results and the management of her osteopenia. The DXA scan 09/06/2014 showed a decrease in her lumbar spine bone density (T-score of 0.4 compared to 1.3 on 02/13/2009, both normal), and a decrease in her right femur bone density (T-score -2.1 compared to -1.6 on 02/13/2009, both osteopenic.) Her FRAX results showed a 4.1% 10-year probability of hip fracture and an 18.8% 10-year probability of major osteoporotic fracture. The hip fracture risk is above the current recommended threshold of 3% for consideration of pharmacologic therapy in patients with osteopenia. I discussed this with patient, and I discussed the benefits and potential side effects of bisphosphonate therapy. Her last vitamin D level was low, and this should be corrected before starting a bisphosphonate if she chooses pharmacologic treatment. I advised her to increase her vitamin D3 from 1000 units daily to a dose of 2000 units daily, and to continue her calcium with vitamin D twice a day. I recommended that she return in early June for follow-up including a vitamin D level and for further discussion of whether to start bisphosphonate therapy. ______

## 2014-09-12 NOTE — Telephone Encounter (Signed)
I called patient to discuss her DEXA scan results and the management of her osteopenia.  The DXA scan 09/06/2014 showed a decrease in her lumbar spine bone density (T-score of 0.4 compared to 1.3 on 02/13/2009, both normal), and a decrease in her right femur bone density (T-score -2.1 compared to -1.6 on 02/13/2009, both osteopenic.)  Her FRAX results showed a 4.1% 10-year probability of hip fracture and an 18.8% 10-year probability of major osteoporotic fracture.  The hip fracture risk is above the current recommended threshold of 3% for consideration of pharmacologic therapy in patients with osteopenia.  I discussed this with patient, and I discussed the benefits and potential side effects of bisphosphonate therapy.  Her last vitamin D level was low, and this should be corrected before starting a bisphosphonate if she chooses pharmacologic treatment.  I advised her to increase her vitamin D3 from 1000 units daily to a dose of 2000 units daily, and to continue her calcium with vitamin D twice a day.  I recommended that she return in early June for follow-up including a vitamin D level and for further discussion of whether to start bisphosphonate therapy.

## 2014-09-13 ENCOUNTER — Other Ambulatory Visit: Payer: Self-pay | Admitting: Internal Medicine

## 2014-10-08 ENCOUNTER — Telehealth: Payer: Self-pay | Admitting: *Deleted

## 2014-10-08 NOTE — Telephone Encounter (Signed)
Recently having head congestion. Only during the night has productive cough - states it looks like mucous. Appt made 10/11/14 1:15PM Dr Shirlee LatchMcLean. Suggest to try NS spray for nasal congestion.  Will call clinic if she needs to be seen sooner. Stanton KidneyDebra Bob Daversa RN 10/08/14 1:30PM

## 2014-10-11 ENCOUNTER — Encounter: Payer: Self-pay | Admitting: Internal Medicine

## 2014-10-11 ENCOUNTER — Ambulatory Visit (INDEPENDENT_AMBULATORY_CARE_PROVIDER_SITE_OTHER): Payer: Medicare Other | Admitting: Internal Medicine

## 2014-10-11 VITALS — BP 120/74 | HR 70 | Temp 98.7°F | Ht 67.0 in | Wt 216.7 lb

## 2014-10-11 DIAGNOSIS — H6122 Impacted cerumen, left ear: Secondary | ICD-10-CM | POA: Diagnosis not present

## 2014-10-11 DIAGNOSIS — J069 Acute upper respiratory infection, unspecified: Secondary | ICD-10-CM | POA: Diagnosis not present

## 2014-10-11 DIAGNOSIS — I10 Essential (primary) hypertension: Secondary | ICD-10-CM

## 2014-10-11 DIAGNOSIS — R42 Dizziness and giddiness: Secondary | ICD-10-CM | POA: Diagnosis not present

## 2014-10-11 NOTE — Patient Instructions (Signed)
Please get nasal saline for your nose  Continue Delsym for cough Please try Benadryl 12.5 mg over the counter or Zyrtec over the counter until your symptoms get better  Follow up in 1 week sooner if needed   Upper Respiratory Infection, Adult An upper respiratory infection (URI) is also sometimes known as the common cold. The upper respiratory tract includes the nose, sinuses, throat, trachea, and bronchi. Bronchi are the airways leading to the lungs. Most people improve within 1 week, but symptoms can last up to 2 weeks. A residual cough may last even longer.  CAUSES Many different viruses can infect the tissues lining the upper respiratory tract. The tissues become irritated and inflamed and often become very moist. Mucus production is also common. A cold is contagious. You can easily spread the virus to others by oral contact. This includes kissing, sharing a glass, coughing, or sneezing. Touching your mouth or nose and then touching a surface, which is then touched by another person, can also spread the virus. SYMPTOMS  Symptoms typically develop 1 to 3 days after you come in contact with a cold virus. Symptoms vary from person to person. They may include:  Runny nose.  Sneezing.  Nasal congestion.  Sinus irritation.  Sore throat.  Loss of voice (laryngitis).  Cough.  Fatigue.  Muscle aches.  Loss of appetite.  Headache.  Low-grade fever. DIAGNOSIS  You might diagnose your own cold based on familiar symptoms, since most people get a cold 2 to 3 times a year. Your caregiver can confirm this based on your exam. Most importantly, your caregiver can check that your symptoms are not due to another disease such as strep throat, sinusitis, pneumonia, asthma, or epiglottitis. Blood tests, throat tests, and X-rays are not necessary to diagnose a common cold, but they may sometimes be helpful in excluding other more serious diseases. Your caregiver will decide if any further tests are  required. RISKS AND COMPLICATIONS  You may be at risk for a more severe case of the common cold if you smoke cigarettes, have chronic heart disease (such as heart failure) or lung disease (such as asthma), or if you have a weakened immune system. The very young and very old are also at risk for more serious infections. Bacterial sinusitis, middle ear infections, and bacterial pneumonia can complicate the common cold. The common cold can worsen asthma and chronic obstructive pulmonary disease (COPD). Sometimes, these complications can require emergency medical care and may be life-threatening. PREVENTION  The best way to protect against getting a cold is to practice good hygiene. Avoid oral or hand contact with people with cold symptoms. Wash your hands often if contact occurs. There is no clear evidence that vitamin C, vitamin E, echinacea, or exercise reduces the chance of developing a cold. However, it is always recommended to get plenty of rest and practice good nutrition. TREATMENT  Treatment is directed at relieving symptoms. There is no cure. Antibiotics are not effective, because the infection is caused by a virus, not by bacteria. Treatment may include:  Increased fluid intake. Sports drinks offer valuable electrolytes, sugars, and fluids.  Breathing heated mist or steam (vaporizer or shower).  Eating chicken soup or other clear broths, and maintaining good nutrition.  Getting plenty of rest.  Using gargles or lozenges for comfort.  Controlling fevers with ibuprofen or acetaminophen as directed by your caregiver.  Increasing usage of your inhaler if you have asthma. Zinc gel and zinc lozenges, taken in the first 24  hours of the common cold, can shorten the duration and lessen the severity of symptoms. Pain medicines may help with fever, muscle aches, and throat pain. A variety of non-prescription medicines are available to treat congestion and runny nose. Your caregiver can make  recommendations and may suggest nasal or lung inhalers for other symptoms.  HOME CARE INSTRUCTIONS   Only take over-the-counter or prescription medicines for pain, discomfort, or fever as directed by your caregiver.  Use a warm mist humidifier or inhale steam from a shower to increase air moisture. This may keep secretions moist and make it easier to breathe.  Drink enough water and fluids to keep your urine clear or pale yellow.  Rest as needed.  Return to work when your temperature has returned to normal or as your caregiver advises. You may need to stay home longer to avoid infecting others. You can also use a face mask and careful hand washing to prevent spread of the virus. SEEK MEDICAL CARE IF:   After the first few days, you feel you are getting worse rather than better.  You need your caregiver's advice about medicines to control symptoms.  You develop chills, worsening shortness of breath, or brown or red sputum. These may be signs of pneumonia.  You develop yellow or brown nasal discharge or pain in the face, especially when you bend forward. These may be signs of sinusitis.  You develop a fever, swollen neck glands, pain with swallowing, or white areas in the back of your throat. These may be signs of strep throat. SEEK IMMEDIATE MEDICAL CARE IF:   You have a fever.  You develop severe or persistent headache, ear pain, sinus pain, or chest pain.  You develop wheezing, a prolonged cough, cough up blood, or have a change in your usual mucus (if you have chronic lung disease).  You develop sore muscles or a stiff neck. Document Released: 12/01/2000 Document Revised: 08/30/2011 Document Reviewed: 09/12/2013 [facility] Patient Information 2015 [place]. This information is not intended to replace advice given to you by your health care provider. Make sure you discuss any questions you have with your health care provider.

## 2014-10-11 NOTE — Assessment & Plan Note (Signed)
BP controlled today continue meds as directed.

## 2014-10-11 NOTE — Assessment & Plan Note (Addendum)
Irrigated today L ear

## 2014-10-11 NOTE — Assessment & Plan Note (Addendum)
nasal saline for your nose  Continue Delsym for cough Please try Benadryl 12.5 mg over the counter or Zyrtec over the counter until your symptoms get better  Dizziness likely related to acute URI.checked orthostatics negative Follow up in 1 week sooner if needed

## 2014-10-11 NOTE — Progress Notes (Signed)
   Subjective:    Patient ID: [name], female    DOB: 04/11/1941, 74 y.o.   MRN: [id number]  HPI Comments: 74 y.o PMH vit D def, neuropathy, HLD, SVT, HTN, DJD, back pain, anemia, seasonal allergies   She presents for sx's since last Friday 1. Symptoms are cough with green/yellow now clear sputum, hoarseness improved, +chills w/o fever, nasal congestion.  She has also had runny nose with green/red mucous, decreased hearing esp in right ear and right ear has been aching and feels congested.  She also has had sore throat which is resolved.  She denies h/a.  She tried Delsym bid which is helping.  She has tried mucinex in the past that made her nose bleed.  She states a lady in church just got over pneumonia and was coughing.    She also has had ear congestion.  She started to feel dizzy yesterday with the room spinning as well.     SH-12 grade education      Review of Systems  HENT: Positive for congestion, rhinorrhea and sore throat.   Respiratory: Positive for cough. Negative for shortness of breath.   Cardiovascular: Negative for chest pain.  Gastrointestinal: Negative for abdominal pain.       Objective:   Physical Exam  Constitutional: She is oriented to person, place, and time. Vital signs are normal. She appears well-developed and well-nourished. She is cooperative. No distress.  HENT:  Head: Normocephalic and atraumatic.  Ears:  Mouth/Throat: Oropharynx is clear and moist. No oropharyngeal exudate.  Eyes: Conjunctivae are normal. Pupils are equal, round, and reactive to light. Right eye exhibits no discharge. Left eye exhibits no discharge. No scleral icterus.  Cardiovascular: Normal rate, regular rhythm, S1 normal, S2 normal and normal heart sounds.   No murmur heard. 2+ lower ext edema  Pulmonary/Chest: Effort normal and breath sounds normal. No respiratory distress. She has no wheezes.  Abdominal: Soft. Bowel sounds are normal. There is no tenderness.    Neurological: She is alert and oriented to person, place, and time. Gait normal.  Skin: Skin is warm, dry and intact. No rash noted. She is not diaphoretic.  Psychiatric: She has a normal mood and affect. Her speech is normal and behavior is normal. Judgment and thought content normal. Cognition and memory are normal.  Nursing note and vitals reviewed.         Assessment & Plan:

## 2014-10-11 NOTE — Progress Notes (Signed)
Internal Medicine Clinic Attending  Case discussed with Dr. McLean at the time of the visit.  We reviewed the resident's history and exam and pertinent patient test results.  I agree with the assessment, diagnosis, and plan of care documented in the resident's note. 

## 2014-10-18 ENCOUNTER — Ambulatory Visit: Payer: Self-pay | Admitting: Internal Medicine

## 2014-11-07 ENCOUNTER — Encounter: Payer: Self-pay | Admitting: Internal Medicine

## 2014-11-07 ENCOUNTER — Ambulatory Visit (INDEPENDENT_AMBULATORY_CARE_PROVIDER_SITE_OTHER): Payer: Medicare Other | Admitting: Internal Medicine

## 2014-11-07 VITALS — BP 115/54 | HR 78 | Temp 98.2°F | Ht 67.0 in | Wt 217.8 lb

## 2014-11-07 DIAGNOSIS — G8929 Other chronic pain: Secondary | ICD-10-CM

## 2014-11-07 DIAGNOSIS — M25531 Pain in right wrist: Secondary | ICD-10-CM

## 2014-11-07 DIAGNOSIS — M25561 Pain in right knee: Secondary | ICD-10-CM

## 2014-11-07 DIAGNOSIS — M79641 Pain in right hand: Secondary | ICD-10-CM

## 2014-11-07 MED ORDER — CAPSAICIN-MENTHOL-METHYL SAL 0.025-1-12 % EX CREA: 1.0000 | TOPICAL_CREAM | Freq: Two times a day (BID) | CUTANEOUS | Status: DC | PRN

## 2014-11-07 MED ORDER — ACETAMINOPHEN 500 MG PO TABS: 1000.0000 mg | ORAL_TABLET | Freq: Three times a day (TID) | ORAL | Status: DC | PRN

## 2014-11-07 NOTE — Progress Notes (Signed)
Subjective:   Patient ID: [name] female   DOB: 30-Jan-1941 74 y.o.   MRN: [id number]  HPI: [name] [name] is a [age] y.o. woman with a history of degenerative joint disease, osteopenia on DEXA, chronic back pain, chronic right knee pain, neuropathy, hypertension, hyperlipidemia, vitamin D deficiency, anemia and seasonal allergies who presents to clinic with right knee pain and right wrist pain. She was previously followed by Dr. Turner Danielsowan for her chronic knee pain (orthopaedics) and received glucocorticoid injections through him. Several years ago, he told her that he no longer could offer her joint injections, but would perform knee surgery. She was not interested in surgery at that time and continues to have no interest in surgery. She is not currently taking anything for her joint pain.  She has noticed that her right knee is still first thing in the morning. She is unable to quantify how long this stiffness lasts, but does state that the pain in her knee worsens as the day progresses. She describes it as a deep ache and rates it as a 4/10 in intensity. She has no weakness, numbness or tingling.  Her wrist pain started several months ago. It also worsens as the day progresses. She describes it as a constant ache and rates it at a 4/10 in intensity. She has no weakness, numbness or tingling.    Past Medical History  Diagnosis Date  . Hypertension   . Hyperlipemia   . DJD (degenerative joint disease)   . Chronic low back pain   . Abnormal electromyogram (EMG)     Low grade right S1 radiculopathy 7/93.  [name]. Hot flashes   . Rotator cuff syndrome of right shoulder     S/P right shoulder arthroscopic debridement of a massive rotator cuff tear, greater tuberosity and resection of subacromial spur by Dr. [name] 07/07/2005.  [name]. Herniated lumbar intervertebral disc     S/P L4-5 & L5-S1 laminotomy, foraminotomy, and L5-S1 diskectomy by Dr. [name] 11/01/2000.  . Multiple joint pain   . Iron  deficiency anemia   . Vitamin D deficiency   . SVT (supraventricular tachycardia)     PSVT  . Bilateral leg edema   . Peripheral neuropathy   . Osteopenia     A DEXA bone density scan done 02/13/2009 showed a left femur neck young adult T-score of -1.6 (osteopenia), a right femur neck young adult T-score of -1.6 (osteopenia), and an AP spine young adult T-score of 1.3 (normal).  Her FRAX score gave an estimated 10 year probability of 6.6% for major osteoporotic fracture and 0.8% for hip fracture.  . Seasonal allergic rhinitis   . Elevated alkaline phosphatase level     Bone scan 08/2004 showed no evidence of abnormal bony activity..  . Skin tag     Right axillary area.  . Hypokalemia   . Headache(784.0)   . Diastolic dysfunction     a. 08/2012 Echo: EF 60-65%, Gr 2 DD, triv MR, PASP 44mmHg.  . Chest pain     a. reports nl cath in the 1990's.  . Stroke 04/2013   Current Outpatient Prescriptions  Medication Sig Dispense Refill  . atenolol (TENORMIN) 50 MG tablet Take 1 tablet (50 mg total) by mouth daily. 31 tablet 5  . atorvastatin (LIPITOR) 40 MG tablet Take 1 tablet (40 mg total) by mouth daily. 30 tablet 5  . Calcium Carb-Cholecalciferol (CALCIUM-VITAMIN D3) 600-400 MG-UNIT TABS Take 1 tablet by mouth 2 (two) times daily. 60  tablet 5  . Cholecalciferol ([employer] D3) 1000 UNITS capsule Take 2 capsules (2,000 Units total) by mouth daily. 60 capsule 2  . cloNIDine (CATAPRES) 0.1 MG tablet Take 1 tablet (0.1 mg total) by mouth daily. 30 tablet 11  . clopidogrel (PLAVIX) 75 MG tablet Take 1 tablet (75 mg total) by mouth daily. 30 tablet 5  . Cyanocobalamin (VITAMIN B-12) 500 MCG SUBL Place 1 tablet under the tongue daily.     [name]. Dextromethorphan-Menthol (DELSYM COUGH RELIEF MT) Use as directed in the mouth or throat.    . digoxin (LANOXIN) 0.125 MG tablet Take 1 tablet (0.125 mg total) by mouth daily. 30 tablet 3  . furosemide (LASIX) 20 MG tablet Take 3 tablets (60 mg total) by mouth daily. 270  tablet 1  . meloxicam (MOBIC) 15 MG tablet Take 15 mg by mouth daily as needed for pain.     . potassium chloride SA (KLOR-CON M20) 20 MEQ tablet Take 1 tablet (20 mEq total) by mouth daily. 30 tablet 3   No current facility-administered medications for this visit.   Family History  Problem Relation Age of Onset  . Ovarian cancer Mother [id number]  . Hypertension Mother   . Heart attack Mother [id number]  . Hypertension Sister   . Hypertension Sister   . Breast cancer Neg Hx   . Colon cancer Neg Hx   . Lung cancer Neg Hx   . Cancer Brother   . Neuropathy Sister    History   Social History  . Marital Status: Single    Spouse Name: N/A  . Number of Children: 6  . Years of Education: 121th   Occupational History  . retired    Social History Main Topics  . Smoking status: Former Smoker -- 0.15 packs/day for 2 years    Quit date: 10/07/1999  . Smokeless tobacco: Never Used  . Alcohol Use: No  . Drug Use: No  . Sexual Activity: Not on file   Other Topics Concern  . None   Social History Narrative   Review of Systems: General: no recent illness Skin: no rashes or lesions HEENT: no headaches, no blurred vision Cardiac: no chest pain or palpitations Respiratory: no shortness of breath GI: no changes Urinary: no changes Msk: see HPI Psychiatric: no depressed mood   Objective:  Physical Exam: Filed Vitals:   11/07/14 1442  BP: 115/54  Pulse: 78  Temp: 98.2 F (36.8 C)  TempSrc: Oral  Height: 5\' 7"  (1.702 m)  Weight: 217 lb 12.8 oz (98.793 kg)  SpO2: 99%   Appearance: well-coifed woman in NAD, pleasant HEENT: AT/[place], PERRL, EOMI Heart: RRR, normal S1S2, no murmur Lungs: CTAB, no wheezes Abdomen: BS+, soft, nontender Extremities: Pain at rest (not exacerbated by movement) in right knee, bony enlargement of R>L knee, no tenderness to palpation, full range of motion and strength, no warmth. Pain at rest in right radial wrist (not exacerbated by movement), no evident bony  enlargement, no tenderness to palpation, full range of motion and strength , no warmth. Negative Finkelstein's test Neurologic: A&Ox3, sensation intact, otherwise grossly intact Skin: no rashes or lesions  Assessment & Plan:   Please see problem oriented charting for assessment & plan  Eleonore ChiquitoJulie Gasper Hopes, MD

## 2014-11-07 NOTE — Patient Instructions (Signed)
Apply heat to your right wrist and right knee in the morning. This may help to get rid of the stiffness and ease some of the pain.  Try to start exercising -- [facility] [employer] [facility] offers water aerobics, which is a good, low-impact option for you.  Apply a thin film of capsaicin cream to the painful part of your wrist and knee up to twice per day. You can also take tylenol 1000mg  up to three times a day (this will be a total of 6 pills per day; DO NOT TAKE MORE THAN THIS AMOUNT).  General Instructions:   Please bring your medicines with you each time you come to clinic.  Medicines may include prescription medications, over-the-counter medications, herbal remedies, eye drops, vitamins, or other pills.   Progress Toward Treatment Goals:  Treatment Goal 08/22/2014  Blood pressure at goal    Self Care Goals & Plans:  Self Care Goal 08/22/2014  Manage my medications -  Monitor my health -  Eat healthy foods -  Be physically active -  Other -  Meeting treatment goals maintain the current self-care plan    No flowsheet data found.   Care Management & Community Referrals:  Referral 08/22/2014  Referrals made for care management support none needed  Referrals made to community resources none

## 2014-11-10 DIAGNOSIS — M25531 Pain in right wrist: Secondary | ICD-10-CM | POA: Insufficient documentation

## 2014-11-10 DIAGNOSIS — M79641 Pain in right hand: Secondary | ICD-10-CM | POA: Insufficient documentation

## 2014-11-10 NOTE — Assessment & Plan Note (Addendum)
Patient's chronic knee pain has been bothering her. The pain she describes is consistent with osteoarthritis. No alarm symptoms.  - Suggested trial of heat - Capsaicin cream - Tylenol (emphasized dosing up to 1000mg  TID, and NO MORE)  - Suggested water aerobics through the [employer] program; patient would like to try this and [facility]

## 2014-11-10 NOTE — Assessment & Plan Note (Addendum)
Patient describes right wrist pain that has been bothering her for several months. Worst by the end of the day. No numbness, tingling, weakness, joint enlargement, decrease in range of motion. Negative Finkelstein's test.  - Heat application - Capsaicin cream - Tylenol (emphasized dosing up to 1000mg  TID, and NO MORE)

## 2014-11-12 NOTE — Progress Notes (Signed)
Internal Medicine Clinic Attending  Case discussed with Dr. Mallory at the time of the visit.  We reviewed the resident's history and exam and pertinent patient test results.  I agree with the assessment, diagnosis, and plan of care documented in the resident's note. 

## 2014-11-26 ENCOUNTER — Encounter: Payer: Self-pay | Admitting: Internal Medicine

## 2014-11-26 ENCOUNTER — Ambulatory Visit (INDEPENDENT_AMBULATORY_CARE_PROVIDER_SITE_OTHER): Payer: Medicare Other | Admitting: Internal Medicine

## 2014-11-26 VITALS — BP 120/61 | HR 70 | Temp 98.1°F | Ht 67.0 in | Wt 220.9 lb

## 2014-11-26 DIAGNOSIS — R600 Localized edema: Secondary | ICD-10-CM | POA: Diagnosis not present

## 2014-11-26 DIAGNOSIS — I519 Heart disease, unspecified: Secondary | ICD-10-CM | POA: Diagnosis not present

## 2014-11-26 DIAGNOSIS — I10 Essential (primary) hypertension: Secondary | ICD-10-CM

## 2014-11-26 DIAGNOSIS — R609 Edema, unspecified: Secondary | ICD-10-CM

## 2014-11-26 DIAGNOSIS — Z87891 Personal history of nicotine dependence: Secondary | ICD-10-CM

## 2014-11-26 DIAGNOSIS — M25531 Pain in right wrist: Secondary | ICD-10-CM

## 2014-11-26 MED ORDER — FUROSEMIDE 20 MG PO TABS: 60.0000 mg | ORAL_TABLET | Freq: Every day | ORAL | Status: DC

## 2014-11-26 NOTE — Assessment & Plan Note (Signed)
Right wrist pain has improved with heat and capsaicin cream.   - Continue to apply heat and capsaicin cream to painful area

## 2014-11-26 NOTE — Assessment & Plan Note (Signed)
Most recent weights:  Today: 220 lb 14.4 oz 08/22/14: 222 lb 14.4 oz 08/07/14: 224 lb   Patient has increased bilateral leg edema despite compliance of her 60 mg Lasix daily. The only change in the Lasix is that she has started taking it at night (instead of first thing in the morning). This was precipitated by her decision to use Depends at night (which allowed her to take the Lasix before bedtime). In any case, she has since noticed increased swelling that is much worse in the evening. She spends the majority of her time during the day either standing, walking or sitting without her legs propped up. In the past, when she tried 80 mg Lasix daily, she experienced some heart palpitations.  - Take 40 mg Lasix in the am and 20 mg Lasix before bedtime - Keep legs propped up on stool or another chair while seated - Return to clinic if these conservative changes do not help (can consider increasing Lasix dose)

## 2014-11-26 NOTE — Progress Notes (Signed)
[facility] Subjective:   Patient ID: [name] female   DOB: [birth year] 74 y.o.   MRN: [id number]  HPI: [name] is a 74 y.o. female with a history of degenerative joint disease, osteopenia, chronic back pain, chronic right knee pain, neuropathy, hypertension, hyperlipidemia, vitamin D deficiency, anemia, seasonal allergies and moderate diastolic dysfunction who presents to clinic with increased bilateral lower extremity edema. She has a history of bilateral leg edema due to her diastolic dysfunction. This has been managed in the past with various doses of furosemide; for several years now, the favored dose has been 60 mg daily.   She has noticed that the swelling is much worse at night than it is during the day. She has also noticed some pain in her feet when the swelling is at its worst. During the day, she is on her feet or sitting in a chair without her feet raised for most of the day. She takes her full 60 mg dose of furosemide at night.    Past Medical History  Diagnosis Date  . Hypertension   . Hyperlipemia   . DJD (degenerative joint disease)   . Chronic low back pain   . Abnormal electromyogram (EMG)     Low grade right S1 radiculopathy 7/93.  [name] Hot flashes   . Rotator cuff syndrome of right shoulder     S/P right shoulder arthroscopic debridement of a massive rotator cuff tear, greater tuberosity and resection of subacromial spur by Dr. Gean Birchwood 07/07/2005.  [name] Herniated lumbar intervertebral disc     S/P L4-5 & L5-S1 laminotomy, foraminotomy, and L5-S1 diskectomy by Dr. Trey Sailors 11/01/2000.  . Multiple joint pain   . Iron deficiency anemia   . Vitamin D deficiency   . SVT (supraventricular tachycardia)     PSVT  . Bilateral leg edema   . Peripheral neuropathy   . Osteopenia     A DEXA bone density scan done 02/13/2009 showed a left femur neck young adult T-score of -1.6 (osteopenia), a right femur neck young adult T-score of -1.6  (osteopenia), and an AP spine young adult T-score of 1.3 (normal).  Her FRAX score gave an estimated 10 year probability of 6.6% for major osteoporotic fracture and 0.8% for hip fracture.  . Seasonal allergic rhinitis   . Elevated alkaline phosphatase level     Bone scan 08/2004 showed no evidence of abnormal bony activity..  . Skin tag     Right axillary area.  . Hypokalemia   . Headache(784.0)   . Diastolic dysfunction     a. 08/2012 Echo: EF 60-65%, Gr 2 DD, triv MR, PASP .  . Chest pain     a. reports nl cath in the 1990's.  . Stroke 04/2013   Current Outpatient Prescriptions  Medication Sig Dispense Refill  . acetaminophen (TYLENOL) 500 MG tablet Take 2 tablets (1,000 mg total) by mouth 3 (three) times daily as needed. 100 tablet 2  . atenolol (TENORMIN) 50 MG tablet Take 1 tablet (50 mg total) by mouth daily. 31 tablet 5  . atorvastatin (LIPITOR) 40 MG tablet Take 1 tablet (40 mg total) by mouth daily. 30 tablet 5  . Calcium Carb-Cholecalciferol (CALCIUM-VITAMIN D3) 600-400 MG-UNIT TABS Take 1 tablet by mouth 2 (two) times daily. 60 tablet 5  . Capsaicin-Menthol-Methyl Sal (CAPSAICIN-METHYL SAL-MENTHOL) 0.025-1-12 % CREA Apply 1 Film topically 2 (two) times daily as needed. 56.6 g 1  . Cholecalciferol (CVS D3) 1000 UNITS capsule  Take 2 capsules (2,000 Units total) by mouth daily. 60 capsule 2  . cloNIDine (CATAPRES) 0.1 MG tablet Take 1 tablet (0.1 mg total) by mouth daily. 30 tablet 11  . clopidogrel (PLAVIX) 75 MG tablet Take 1 tablet (75 mg total) by mouth daily. 30 tablet 5  . Cyanocobalamin (VITAMIN B-12) 500 MCG SUBL Place 1 tablet under the tongue daily.     [name]. Dextromethorphan-Menthol (DELSYM COUGH RELIEF MT) Use as directed in the mouth or throat.    . digoxin (LANOXIN) 0.125 MG tablet Take 1 tablet (0.125 mg total) by mouth daily. 30 tablet 3  . furosemide (LASIX) 20 MG tablet Take 3 tablets (60 mg total) by mouth daily. Take 2 tablets in the morning and 1 tablet at  night 270 tablet 1  . potassium chloride SA (KLOR-CON M20) 20 MEQ tablet Take 1 tablet (20 mEq total) by mouth daily. 30 tablet 3   No current facility-administered medications for this visit.   Family History  Problem Relation Age of Onset  . Ovarian cancer Mother 8170  . Hypertension Mother   . Heart attack Mother 5770  . Hypertension Sister   . Hypertension Sister   . Breast cancer Neg Hx   . Colon cancer Neg Hx   . Lung cancer Neg Hx   . Cancer Brother   . Neuropathy Sister    History   Social History  . Marital Status: Single    Spouse Name: N/A  . Number of Children: 6  . Years of Education: 121th   Occupational History  . retired    Social History Main Topics  . Smoking status: Former Smoker -- 0.15 packs/day for 2 years    Quit date: 10/07/1999  . Smokeless tobacco: Never Used  . Alcohol Use: No  . Drug Use: No  . Sexual Activity: Not on file   Other Topics Concern  . None   Social History Narrative   Review of Systems: General: no recent illness Skin: no rashes or lesions HEENT: no headaches or blurred vision Cardiac: no chest pain or palpitations Respiratory: no shortness of breath GI: no abdominal pain, no changes in BMs Urinary: no changes Msk: chronic pain in right wrist (improved), left knee, bilateral ankles, right shoulder Psychiatric: no history of anxiety or depression  Objective:  Physical Exam: Filed Vitals:   11/26/14 1404  BP: 120/61  Pulse: 70  Temp: 98.1 F (36.7 C)  TempSrc: Oral  Height: 5\' 7"  (1.702 m)  Weight: 220 lb 14.4 oz (100.2 kg)  SpO2: 100%   Appearance: in NAD, pleasant woman sitting with medications in examining room HEENT: AT/[place], PERRL, EOMi Heart: RRR, normal S1S2 Lungs: CTAB, no wheezes, normal work of breathing Abdomen: BS+, soft, nontender Musculoskeletal: normal range of motion, pain with strength testing in right shoulder Extremities: 2+ pitting edema to knees bilaterally, nontender Neurologic: A&Ox3,  grossly intact Skin: no rashes or lesions  Assessment & Plan:  Case discussed with Dr. Criselda PeachesMullen  Bilateral leg edema due to diastolic dysfunction Most recent weights:  Today: 220 lb 14.4 oz 08/22/14: 222 lb 14.4 oz 08/07/14: 224 lb   Patient has increased bilateral leg edema despite compliance of her 60 mg Lasix daily. The only change in the Lasix is that she has started taking it at night (instead of first thing in the morning). This was precipitated by her decision to use Depends at night (which allowed her to take the Lasix before bedtime). In any case, she has since noticed  increased swelling that is much worse in the evening. She spends the majority of her time during the day either standing, walking or sitting without her legs propped up. In the past, when she tried 80 mg Lasix daily, she experienced some heart palpitations.  - Take 40 mg Lasix in the am and 20 mg Lasix before bedtime - Keep legs propped up on stool or another chair while seated - Return to clinic if these conservative changes do not help (can consider increasing Lasix dose)   Essential hypertension BP Readings from Last 3 Encounters:  11/26/14 120/61  11/07/14 115/54  10/11/14 120/74    Lab Results  Component Value Date   NA 140 08/22/2014   K 4.5 08/22/2014   CREATININE 0.66 08/22/2014    Assessment: Blood pressure control: good   Progress toward BP goal: at goal    Plan: Medications:  continue current medications; clonidine 0.1 mg daily, atenolol 50 mg daily and furosemide 60 mg daily (now with new dosing of 40 mg in the am and 20 mg before bedtime) Educational resources provided: brochure Self management tools provided: home blood pressure logbook     Right wrist pain Right wrist pain has improved with heat and capsaicin cream.   - Continue to apply heat and capsaicin cream to painful area     Medications Ordered Meds ordered this encounter  Medications  . furosemide (LASIX) 20 MG tablet     Sig: Take 3 tablets (60 mg total) by mouth daily. Take 2 tablets in the morning and 1 tablet at night    Dispense:  270 tablet    Refill:  1   Other Orders No orders of the defined types were placed in this encounter.

## 2014-11-26 NOTE — Patient Instructions (Signed)
Take two tablets of the Lasix in the morning and one tablet at night.  Keep your feet propped up when you are seated during the day.  General Instructions:   Thank you for bringing your medicines today. This helps us keep you safe from mistakes.   Progress Toward Treatment Goals:  Treatment Goal 08/22/2014  Blood pressure at goal    Self Care Goals & Plans:  Self Care Goal 11/26/2014  Manage my medications take my medicines as prescribed; refill my medications on time; bring my medications to every visit  Monitor my health keep track of my blood pressure  Eat healthy foods eat more vegetables; eat foods that are low in salt; eat baked foods instead of fried foods  Be physically active find an activity I enjoy  Other -  Meeting treatment goals -    No flowsheet data found.   Care Management & Community Referrals:  Referral 08/22/2014  Referrals made for care management support none needed  Referrals made to community resources none

## 2014-11-26 NOTE — Assessment & Plan Note (Signed)
BP Readings from Last 3 Encounters:  11/26/14 120/61  11/07/14 115/54  10/11/14 120/74    Lab Results  Component Value Date   NA 140 08/22/2014   K 4.5 08/22/2014   CREATININE 0.66 08/22/2014    Assessment: Blood pressure control: good   Progress toward BP goal: at goal    Plan: Medications:  continue current medications; clonidine 0.1 mg daily, atenolol 50 mg daily and furosemide 60 mg daily (now with new dosing of 40 mg in the am and 20 mg before bedtime) Educational resources provided: brochure Self management tools provided: home blood pressure logbook

## 2014-12-02 NOTE — Progress Notes (Signed)
Internal Medicine Clinic Attending  Case discussed with Dr. Mallory at the time of the visit.  We reviewed the resident's history and exam and pertinent patient test results.  I agree with the assessment, diagnosis, and plan of care documented in the resident's note. 

## 2014-12-03 ENCOUNTER — Other Ambulatory Visit: Payer: Self-pay | Admitting: Internal Medicine

## 2014-12-03 DIAGNOSIS — Z8679 Personal history of other diseases of the circulatory system: Secondary | ICD-10-CM

## 2014-12-03 NOTE — Assessment & Plan Note (Signed)
Most recent Dig level a bit too high. Wil refill and request Sept appt with new PCP to recheck.

## 2014-12-09 ENCOUNTER — Other Ambulatory Visit: Payer: Self-pay | Admitting: Internal Medicine

## 2014-12-24 ENCOUNTER — Other Ambulatory Visit: Payer: Self-pay | Admitting: Internal Medicine

## 2014-12-24 NOTE — Telephone Encounter (Signed)
Verified dose with phone note. Pls sch appt with new PCP once sch opens.

## 2015-01-01 ENCOUNTER — Other Ambulatory Visit: Payer: Self-pay | Admitting: Internal Medicine

## 2015-01-02 NOTE — Telephone Encounter (Signed)
Pls sch very early Sep [facility] - lasix was increased and need to F/U

## 2015-01-22 ENCOUNTER — Encounter: Payer: Self-pay | Admitting: Adult Health

## 2015-01-22 ENCOUNTER — Ambulatory Visit (INDEPENDENT_AMBULATORY_CARE_PROVIDER_SITE_OTHER): Payer: Medicare Other | Admitting: Adult Health

## 2015-01-22 VITALS — BP 120/71 | HR 68 | Ht 67.0 in | Wt 217.0 lb

## 2015-01-22 DIAGNOSIS — Z8673 Personal history of transient ischemic attack (TIA), and cerebral infarction without residual deficits: Secondary | ICD-10-CM | POA: Diagnosis not present

## 2015-01-22 NOTE — Progress Notes (Signed)
PATIENT: [name] DOB: 28-Sep-1940  REASON FOR VISIT: follow up- history of stroke HISTORY FROM: patient  HISTORY OF PRESENT ILLNESS: [name] is a 74 year old female with a history of cerebrovascular disease and stroke. She returns today for an evaluation. The patient's primary care provider manages her cholesterol and hypertension. Today her blood pressure is good-it is 120/71. The patient is on atorvastatin for her cholesterol. Her recent lipid panel in March showed that her LDL was 80. The patient denies any new neurological symptoms. Denies any strokelike symptoms. She continues on Plavix for stroke prevention. She will occasionally have numbness in the left hand- this has been ongoing. The patient had carotid Doppler studies in February 2016 that were unremarkable. The patient reports that she does have some trouble with the right knee she has been seen orthopedic who has recommended knee replacement. She returns today for an evaluation.  HISTORY 07/23/14 ([name]):[name] seen for first office followup visit following hospital admission on 05/29/13 for stroke. She presented with sudden onset of left-sided numbness. She actually had a similar presentation 3 weeks ago in November and that time she is admitted fibrillation for stroke but no acute infarct and bilateral old basal ganglia and thalamic lacunar infarcts are noted. During the current admission she was found to have an acute left thalamic and right pontine infarct. Etiology was found to be small vessel disease. MRI of the brain showed no large vessel occlusion. Transthoracic echo showed normal ejection fraction without context of embolism. Lipid profile showed total cholesterol 205, triglycerides 68, HDL 59 and LDL 132 mg percent. She was started on atorvastatin and Plavix. She still should done well since discharge he still had some intermittent left face and hand tingling and numbness from time to time which is  getting better but does not completely improve. She states her blood pressure is well controlled with a slightly elevated in office today. She is tolerating Plavix without bleeding or bruising as well as Lipitor without any side effects. She plans to go on diet, lose weight and exercise regularly. She has no new complaints today.   UPDATE 11/16/13 (LL): [name] returns to the office for stroke followup. She has been doing well, no new neurovascular symptoms. Her blood pressure is well controlled, it is 120/73 in the office today. She has an appt at her PCP in June to have blood work checked, will have lipids checked then. She is tolerating statin and Plavix well without any known side effects. Still having some intermittent hand tingling and numbness occasionally. No new complaints. Update 07/23/2014 : She returns for follow-up after last visit 7 months ago. She continues to do well without recurrent stroke or TIA symptoms. She at has some intermittent numbness in the left hand particularly when she is tired but it is not bothersome. She is tolerating Plavix well without significant bleeding, bruising or other side effects. She states she had lab work done in the primary care physician's office but I do not have the results for my review today. Her blood pressure is quite good and today it is 122/73 in office. She has some walking difficulties mainly related to her right knee pain. No other new neurological issues.   REVIEW OF SYSTEMS: Out of a complete 14 system review of symptoms, the patient complains only of the following symptoms, and all other reviewed systems are negative.  ALLERGIES: Allergies  Allergen Reactions  . Penicillins     REACTION: Local  swelling in right arm after injection.  . Sulfonamide Derivatives     REACTION: Shortness of breath    HOME MEDICATIONS: Outpatient Prescriptions Prior to Visit  Medication Sig Dispense Refill  . acetaminophen (TYLENOL) 500 MG tablet Take 2  tablets (1,000 mg total) by mouth 3 (three) times daily as needed. 100 tablet 2  . atenolol (TENORMIN) 50 MG tablet Take 1 tablet (50 mg total) by mouth daily. 31 tablet 5  . atorvastatin (LIPITOR) 40 MG tablet TAKE 1 TABLET (40 MG TOTAL) BY MOUTH DAILY. 30 tablet 11  . Calcium Carb-Cholecalciferol (CALCIUM-VITAMIN D3) 600-400 MG-UNIT TABS Take 1 tablet by mouth 2 (two) times daily. 60 tablet 5  . Capsaicin-Menthol-Methyl Sal (CAPSAICIN-METHYL SAL-MENTHOL) 0.025-1-12 % CREA Apply 1 Film topically 2 (two) times daily as needed. 56.6 g 1  . cloNIDine (CATAPRES) 0.1 MG tablet Take 1 tablet (0.1 mg total) by mouth daily. 30 tablet 11  . clopidogrel (PLAVIX) 75 MG tablet TAKE 1 TABLET (75 MG TOTAL) BY MOUTH DAILY. 30 tablet 5  . CVS D3 1000 UNITS capsule TAKE 2 CAPSULES BY MOUTH DAILY 60 capsule 2  . Cyanocobalamin (VITAMIN B-12) 500 MCG SUBL Place 1 tablet under the tongue daily.     [name] Dextromethorphan-Menthol (DELSYM COUGH RELIEF MT) Use as directed in the mouth or throat.    . digoxin (LANOXIN) 0.125 MG tablet TAKE 1 TABLET BY MOUTH EVERY DAY 30 tablet 3  . furosemide (LASIX) 20 MG tablet Take 3 tablets (60 mg total) by mouth daily. Take 2 tablets in the morning and 1 tablet at night 270 tablet 1  . KLOR-CON M20 20 MEQ tablet TAKE 1 TABLET (20 MEQ TOTAL) BY MOUTH DAILY. 30 tablet 2   No facility-administered medications prior to visit.    PAST MEDICAL HISTORY: Past Medical History  Diagnosis Date  . Hypertension   . Hyperlipemia   . DJD (degenerative joint disease)   . Chronic low back pain   . Abnormal electromyogram (EMG)     Low grade right S1 radiculopathy 7/93.  [name] Hot flashes   . Rotator cuff syndrome of right shoulder     S/P right shoulder arthroscopic debridement of a massive rotator cuff tear, greater tuberosity and resection of subacromial spur by Dr. Gean Birchwood 07/07/2005.  [name] Herniated lumbar intervertebral disc     S/P L4-5 & L5-S1 laminotomy, foraminotomy, and L5-S1  diskectomy by Dr. Trey Sailors 11/01/2000.  . Multiple joint pain   . Iron deficiency anemia   . Vitamin D deficiency   . SVT (supraventricular tachycardia)     PSVT  . Bilateral leg edema   . Peripheral neuropathy   . Osteopenia     A DEXA bone density scan done 02/13/2009 showed a left femur neck young adult T-score of -1.6 (osteopenia), a right femur neck young adult T-score of -1.6 (osteopenia), and an AP spine young adult T-score of 1.3 (normal).  Her FRAX score gave an estimated 10 year probability of 6.6% for major osteoporotic fracture and 0.8% for hip fracture.  . Seasonal allergic rhinitis   . Elevated alkaline phosphatase level     Bone scan 08/2004 showed no evidence of abnormal bony activity..  . Skin tag     Right axillary area.  . Hypokalemia   . Headache(784.0)   . Diastolic dysfunction     a. 08/2012 Echo: EF 60-65%, Gr 2 DD, triv MR, PASP .  . Chest pain     a. reports nl cath in the  1990's.  . Stroke 04/2013    PAST SURGICAL HISTORY: Past Surgical History  Procedure Laterality Date  . Total abdominal hysterectomy  1992  . Lumbar disc surgery  11/01/2000     L4-5, L5-S1 laminotomy, foraminotomy and L5-S1 diskectomy  . Shoulder arthroscopy  1/172007    S/P right shoulder arthroscopic debridement of a massive rotator cuff tear, greater tuberoplasty and resection of subacromial spur by Dr. Gean Birchwood on 07/07/2005.  [name] Cholecystectomy, laparoscopic  1998  . Cholecystectomy      FAMILY HISTORY: Family History  Problem Relation Age of Onset  . Ovarian cancer Mother 90  . Hypertension Mother   . Heart attack Mother 57  . Hypertension Sister   . Hypertension Sister   . Breast cancer Neg Hx   . Colon cancer Neg Hx   . Lung cancer Neg Hx   . Cancer Brother   . Neuropathy Sister     SOCIAL HISTORY: History   Social History  . Marital Status: Single    Spouse Name: N/A  . Number of Children: 6  . Years of Education: 121th   Occupational History  .  retired    Social History Main Topics  . Smoking status: Former Smoker -- 0.15 packs/day for 2 years    Quit date: 10/07/1999  . Smokeless tobacco: Never Used  . Alcohol Use: No  . Drug Use: No  . Sexual Activity: Not on file   Other Topics Concern  . Not on file   Social History Narrative      PHYSICAL EXAM  Filed Vitals:   01/22/15 1509  BP: 120/71  Pulse: 68  Height: 5\' 7"  (1.702 m)  Weight: 217 lb (98.431 kg)   Body mass index is 33.98 kg/(m^2).  Generalized: Well developed, in no acute distress   Neurological examination  Mentation: Alert oriented to time, place, history taking. Follows all commands speech and language fluent Cranial nerve II-XII: Pupils were equal round reactive to light. Extraocular movements were full, visual field were full on confrontational test. Facial sensation and strength were normal. Uvula tongue midline. Head turning and shoulder shrug  were normal and symmetric. Motor: The motor testing reveals 5 over 5 strength of all 4 extremities. Good symmetric motor tone is noted throughout.  Sensory: Sensory testing is intact to soft touch on all 4 extremities. No evidence of extinction is noted.  Coordination: Cerebellar testing reveals good finger-nose-finger and heel-to-shin bilaterally.  Gait and station: Gait is normal. Tandem gait is normal. Romberg is negative. No drift is seen.  Reflexes: Deep tendon reflexes are symmetric and normal bilaterally.   DIAGNOSTIC DATA (LABS, IMAGING, TESTING) - I reviewed patient records, labs, notes, testing and imaging myself where available.  Lab Results  Component Value Date   WBC 4.4 08/22/2014   HGB 11.4* 08/22/2014   HCT 35.9* 08/22/2014   MCV 85.5 08/22/2014   PLT 157 08/22/2014      Component Value Date/Time   NA 140 08/22/2014 0925   K 4.5 08/22/2014 0925   CL 105 08/22/2014 0925   CO2 29 08/22/2014 0925   GLUCOSE 89 08/22/2014 0925   BUN 17 08/22/2014 0925   CREATININE 0.66 08/22/2014  0925   CREATININE 0.56 07/28/2013 1054   CALCIUM 9.8 08/22/2014 0925   PROT 6.0 08/22/2014 0925   ALBUMIN 3.9 08/22/2014 0925   AST 13 08/22/2014 0925   ALT 10 08/22/2014 0925   ALKPHOS 118* 08/22/2014 0925   BILITOT 0.8 08/22/2014 5409  GFRNONAA 88 08/22/2014 0925   GFRNONAA >90 07/28/2013 1054   GFRAA >89 08/22/2014 0925   GFRAA >90 07/28/2013 1054   Lab Results  Component Value Date   CHOL 145 08/22/2014   HDL 57 08/22/2014   LDLCALC 80 08/22/2014   TRIG 41 08/22/2014   CHOLHDL 2.5 08/22/2014   Lab Results  Component Value Date   HGBA1C 5.4 08/22/2014   Lab Results  Component Value Date   VITAMINB12 311 05/09/2013   Lab Results  Component Value Date   TSH 2.076 03/31/2010      ASSESSMENT AND PLAN 74 y.o. year old female  has a past medical history of Hypertension; Hyperlipemia; DJD (degenerative joint disease); Chronic low back pain; Abnormal electromyogram (EMG); Hot flashes; Rotator cuff syndrome of right shoulder; Herniated lumbar intervertebral disc; Multiple joint pain; Iron deficiency anemia; Vitamin D deficiency; SVT (supraventricular tachycardia); Bilateral leg edema; Peripheral neuropathy; Osteopenia; Seasonal allergic rhinitis; Elevated alkaline phosphatase level; Skin tag; Hypokalemia; Headache(784.0); Diastolic dysfunction; Chest pain; and Stroke (04/2013). here with:  1. History of stroke  Overall the patient has done well. She should continue on Plavix for stroke prevention. She should maintain strict control of her blood pressure with goal below 130/90. Her hemoglobin A1c goal below 6.5%. And lipids with LDL cholesterol goal less than 100. If the patient has any strokelike symptoms she should call 911 immediately. Patient verbalized understanding. She will follow-up in one year with Dr. Pearlean Brownie.     Carla Penny, MSN, NP-C 01/22/2015, 3:10 PM [facility] [address] [place], [place] [address] [phone]  Note: This  document was prepared with digital dictation and possible smart phrase technology. Any transcriptional errors that result from this process are unintentional.

## 2015-01-22 NOTE — Progress Notes (Signed)
I agree with the above plan 

## 2015-01-22 NOTE — Patient Instructions (Signed)
Continue Plavix. Maintain strict control of blood pressure with goal below 130/90 Cholesterol LDL goal less than 100 Hemoglobin A1c less than 6.5% If you have any strokelike symptoms you should call 911 immediately

## 2015-01-28 ENCOUNTER — Other Ambulatory Visit: Payer: Self-pay | Admitting: Internal Medicine

## 2015-03-03 ENCOUNTER — Encounter: Payer: Self-pay | Admitting: Student in an Organized Health Care Education/Training Program

## 2015-03-03 ENCOUNTER — Ambulatory Visit (INDEPENDENT_AMBULATORY_CARE_PROVIDER_SITE_OTHER): Payer: Medicare Other | Admitting: Student in an Organized Health Care Education/Training Program

## 2015-03-03 VITALS — BP 110/85 | HR 79 | Temp 98.0°F | Wt 218.7 lb

## 2015-03-03 DIAGNOSIS — M1711 Unilateral primary osteoarthritis, right knee: Secondary | ICD-10-CM

## 2015-03-03 DIAGNOSIS — R2681 Unsteadiness on feet: Secondary | ICD-10-CM | POA: Insufficient documentation

## 2015-03-03 DIAGNOSIS — R609 Edema, unspecified: Secondary | ICD-10-CM

## 2015-03-03 DIAGNOSIS — Z9181 History of falling: Secondary | ICD-10-CM

## 2015-03-03 DIAGNOSIS — Z8679 Personal history of other diseases of the circulatory system: Secondary | ICD-10-CM

## 2015-03-03 DIAGNOSIS — I10 Essential (primary) hypertension: Secondary | ICD-10-CM | POA: Diagnosis not present

## 2015-03-03 DIAGNOSIS — Z23 Encounter for immunization: Secondary | ICD-10-CM

## 2015-03-03 DIAGNOSIS — R600 Localized edema: Secondary | ICD-10-CM

## 2015-03-03 NOTE — Assessment & Plan Note (Signed)
No sensation of palpitations for several years. She has been on digoxin, but I anticipate as she gets older this will become more difficult to maintain an appropriate dose. Admitted try the patient off digoxin and use only atenolol to suppress any paroxysmal SVT she may have. This might be a resolved problem as well. If she has further palpitations, instead of starting back on digoxin I like to increase beta-blockade as she can tolerate. If she has palpitations we would also need to do an ambulatory monitor to look for paroxysmal atrial fibrillation.

## 2015-03-03 NOTE — Assessment & Plan Note (Signed)
Clinical course seems consistent with congestive heart failure given bilateral lower extremity edema along with mild orthopnea. Last echocardiogram almost 2 years ago showed moderate diastolic dysfunction and elevated pulmonary pressures with mean RA pressure of 40 mmHg. Lower extremity edema since that time is progressive despite good compliance with diuresis. To me the swelling she has now seems out of proportion with her previous echocardiogram findings. Medication effect is possible, both clonidine and digoxin can be associated with lower extremity edema. I think that they are giving [place] very much utility at this point so have a trial off these 2 medications. Pulmonary hypertension might be a primary process as well. No history of underlying lung disease. Plan is to repeat her echocardiogram. If her pulmonary pressures are more elevated, we will work her up for other causes of pulmonary hypertension which could include obstructive sleep apnea or primary PAH. I doubt liver cirrhosis given very remote alcohol history. Weight is stable at 218lbs, so I am keeping lasix at current dose of  qAM and  qPM.

## 2015-03-03 NOTE — Assessment & Plan Note (Signed)
Patient with a very unsteady gait. She feels like she is at risk for falls. No current centrally acting medications on her list. I referred her to physical therapy for gait training and stability. She may also benefit from a Rollator walker at this point. Last DEXA scan showed osteopenia, going to talk to her about the benefits and side effects of bisphosphonate therapy at our next visit.

## 2015-03-03 NOTE — Progress Notes (Signed)
   See Encounters tab for problem-based medical decision making  __________________________________________________________  HPI:  74 year old woman comes in today for follow-up of progressive lower extremity edema. She reports worsening swelling in her legs over the last 1 year, with the most swelling occurring in the last few months. She is been unable to wear her usual shoes, now she wears tennis shoes and has difficulty tying them. She reports compliance with Lasix dosing but has minimal benefit. Improves the swelling through the day, but then the swelling is back by the next morning. She's been taking the Lasix despite urinary incontinence, wears depends. Denies any chest pain or dyspnea on exertion. She lays on 2 pillows, has a sensation of shortness of breath with one pillow. Denies any increased weight. No cough or sputum production. Eating and drinking well. No fevers, chills, or night sweats. Denies feeling like her abdomen is distended, no sensation of hands being tight along the waistband. Patient does not smoke cigarettes, doesn't currently drink alcohol.  Patient's other large complaint today is of worsening right knee pain. She has osteoarthritis in her right knee which has been progressive. She seen orthopedic surgery in the past who offered her knee replacement surgery. Currently she uses Tylenol 500 mg once daily as needed for the pain. She bought herself a cane a couple months ago because she feels unsteady on her feet. She does worry that she will fall. Reports that her pain is bad that she's had reduced amount of activity, leaving her house less often now.  __________________________________________________________  Problem List: Patient Active Problem List   Diagnosis Date Noted  . Risk for falls 03/03/2015  . Anemia 05/01/2014  . Healthcare maintenance 04/08/2014  . History of cerebrovascular accident 05/29/2013  . Vitamin D deficiency 05/08/2010  . Osteopenia 04/03/2009    . Bilateral leg edema due to diastolic dysfunction 02/22/2007  . Essential hypertension 09/14/2006  . Osteoarthritis 04/27/2006  . History of PSVT (paroxysmal supraventricular tachycardia) 04/27/2006    Medications: Reconciled today in Epic __________________________________________________________  Physical Exam:  Vital Signs: Filed Vitals:   03/03/15 0855  BP: 110/85  Pulse: 79  Temp: 98 F (36.7 C)  TempSrc: Oral  Weight: 218 lb 11.2 oz (99.202 kg)  SpO2: 99%    Gen: Well appearing, NAD ENT: OP clear without erythema or exudate.  Neck: No cervical LAD, No thyromegaly or nodules, No JVD. CV: RRR, no murmurs Pulm: Normal effort, CTA throughout, no wheezing Abd: Soft, NT, ND, normal BS.  Ext: Warm, 2+ large lower extremities edema bilaterally up to the knees, no chronic stasis changes of the skin, normal pulses, bilateral knees with crepitus and no effusion. Skin: No atypical appearing moles. No rashes

## 2015-03-03 NOTE — Assessment & Plan Note (Signed)
Blood pressure is very well controlled. Organic continue atenolol 50 mg once daily, and discontinue clonidine 0.1 mg daily. Because it's a low dose I don't think she needs to taper. I am hopeful that discontinue clonidine may improve her lower extremity edema.

## 2015-03-09 ENCOUNTER — Other Ambulatory Visit: Payer: Self-pay | Admitting: Internal Medicine

## 2015-03-25 ENCOUNTER — Ambulatory Visit (HOSPITAL_COMMUNITY)
Admission: RE | Admit: 2015-03-25 | Discharge: 2015-03-25 | Disposition: A | Discharge status: Home / Self Care | Payer: Medicare Other | Source: Ambulatory Visit | Attending: Student in an Organized Health Care Education/Training Program | Admitting: Student in an Organized Health Care Education/Training Program

## 2015-03-25 ENCOUNTER — Telehealth: Payer: Self-pay | Admitting: Student in an Organized Health Care Education/Training Program

## 2015-03-25 DIAGNOSIS — R609 Edema, unspecified: Secondary | ICD-10-CM | POA: Insufficient documentation

## 2015-03-25 DIAGNOSIS — E559 Vitamin D deficiency, unspecified: Secondary | ICD-10-CM | POA: Diagnosis not present

## 2015-03-25 DIAGNOSIS — I10 Essential (primary) hypertension: Secondary | ICD-10-CM | POA: Diagnosis not present

## 2015-03-25 DIAGNOSIS — I517 Cardiomegaly: Secondary | ICD-10-CM | POA: Diagnosis not present

## 2015-03-25 DIAGNOSIS — I340 Nonrheumatic mitral (valve) insufficiency: Secondary | ICD-10-CM | POA: Insufficient documentation

## 2015-03-25 DIAGNOSIS — I358 Other nonrheumatic aortic valve disorders: Secondary | ICD-10-CM | POA: Insufficient documentation

## 2015-03-25 DIAGNOSIS — I272 Pulmonary hypertension, unspecified: Secondary | ICD-10-CM

## 2015-03-25 NOTE — Progress Notes (Signed)
  Echocardiogram 2D Echocardiogram has been performed.  [name] 03/25/2015, 9:55 AM

## 2015-03-25 NOTE — Telephone Encounter (Signed)
Spoke with the patient over the phone. Swelling is improved, may have been a medication effect from clonidine or digoxin which we stopped at our last appointment. No dyspnea on exertion. Plan to see her in November for followup, hold on sleep study and PFTs for now until our follow up appointment.

## 2015-03-25 NOTE — Telephone Encounter (Signed)
Pt called clinic about test results. Note to [facility] read to pt from Dr Oswaldo Done. Pt states legs are better. Appt made for FU 05/05/15 9:15AM with Dr Oswaldo Done. Dr Oswaldo Done talked with pt while she was on the phone. [name] RN 03/25/15 3:25PM

## 2015-03-25 NOTE — Telephone Encounter (Signed)
[name],  Echo results back with diastolic dysfunction and stable pulmonary hypertension. I tried to call the patient twice today, left a VM to relay the results and re-assess her lower extremity swelling. I haven't heard a call back yet. If she is still having significant swelling, I would like to arrange for a sleep study and pulmonary function test to better assess the cause of the pulmonary hypertension. Can you help me ensure she gets a follow up appointment with me, and hopefully completes these studies in the meantime to better assess why she is having so much edema?

## 2015-03-25 NOTE — Addendum Note (Signed)
Addended by: Maura Crandall on: 03/25/2015 04:29 PM   Modules accepted: Orders

## 2015-03-31 ENCOUNTER — Ambulatory Visit
Payer: Medicare Other | Attending: Student in an Organized Health Care Education/Training Program | Admitting: Physical Therapy

## 2015-03-31 ENCOUNTER — Encounter: Payer: Self-pay | Admitting: Physical Therapy

## 2015-03-31 DIAGNOSIS — M545 Low back pain: Secondary | ICD-10-CM | POA: Diagnosis present

## 2015-03-31 DIAGNOSIS — M25561 Pain in right knee: Secondary | ICD-10-CM | POA: Diagnosis present

## 2015-03-31 DIAGNOSIS — R269 Unspecified abnormalities of gait and mobility: Secondary | ICD-10-CM | POA: Diagnosis present

## 2015-03-31 DIAGNOSIS — R2681 Unsteadiness on feet: Secondary | ICD-10-CM | POA: Insufficient documentation

## 2015-03-31 DIAGNOSIS — R531 Weakness: Secondary | ICD-10-CM | POA: Insufficient documentation

## 2015-03-31 NOTE — Therapy (Signed)
[facility] [address] [place], [place], [address] Phone: [phone]   Fax:  [phone]  Physical Therapy Evaluation  Patient Details  Name: [name] MRN: [id number] Date of Birth: 1940/11/03 Referring Provider:  Tyson Alias,*  Encounter Date: 03/31/2015      PT End of Session - 03/31/15 2110    Visit Number 1   Number of Visits 17  eval + 16 visits   Date for PT Re-Evaluation 05/30/15   Authorization Type [employer] - G Codes every 10 visits   PT Start Time 1446   PT Stop Time 1534   PT Time Calculation (min) 48 min   Activity Tolerance Patient tolerated treatment well   Behavior During Therapy [facility] for tasks assessed/performed      Past Medical History  Diagnosis Date  . Hypertension   . Hyperlipemia   . DJD (degenerative joint disease)   . Chronic low back pain   . Abnormal electromyogram (EMG)     Low grade right S1 radiculopathy 7/93.  [name] Hot flashes   . Rotator cuff syndrome of right shoulder     S/P right shoulder arthroscopic debridement of a massive rotator cuff tear, greater tuberosity and resection of subacromial spur by Dr. Gean Birchwood 07/07/2005.  [name] Herniated lumbar intervertebral disc     S/P L4-5 & L5-S1 laminotomy, foraminotomy, and L5-S1 diskectomy by Dr. Trey Sailors 11/01/2000.  . Multiple joint pain   . Iron deficiency anemia   . Vitamin D deficiency   . SVT (supraventricular tachycardia) (HCC)     PSVT  . Bilateral leg edema   . Peripheral neuropathy (HCC)   . Osteopenia     A DEXA bone density scan done 02/13/2009 showed a left femur neck young adult T-score of -1.6 (osteopenia), a right femur neck young adult T-score of -1.6 (osteopenia), and an AP spine young adult T-score of 1.3 (normal).  Her FRAX score gave an estimated 10 year probability of 6.6% for major osteoporotic fracture and 0.8% for hip fracture.  . Seasonal allergic rhinitis   . Elevated alkaline  phosphatase level     Bone scan 08/2004 showed no evidence of abnormal bony activity..  . Skin tag     Right axillary area.  . Hypokalemia   . Headache(784.0)   . Diastolic dysfunction     a. 08/2012 Echo: EF 60-65%, Gr 2 DD, triv MR, PASP .  . Chest pain     a. reports nl cath in the 1990's.  . Stroke [facility]) 04/2013    Past Surgical History  Procedure Laterality Date  . Total abdominal hysterectomy  1992  . Lumbar disc surgery  11/01/2000     L4-5, L5-S1 laminotomy, foraminotomy and L5-S1 diskectomy  . Shoulder arthroscopy  1/172007    S/P right shoulder arthroscopic debridement of a massive rotator cuff tear, greater tuberoplasty and resection of subacromial spur by Dr. Gean Birchwood on 07/07/2005.  [name] Cholecystectomy, laparoscopic  1998  . Cholecystectomy      There were no vitals filed for this visit.  Visit Diagnosis:  Abnormality of gait - Plan: PT plan of care cert/re-cert  Unsteadiness - Plan: PT plan of care cert/re-cert  Pain in knee joint, right - Plan: PT plan of care cert/re-cert  Generalized weakness - Plan: PT plan of care cert/re-cert      Subjective Assessment - 03/31/15 1457    Subjective "When I'm walking, I'm going forward instead of standing straight up."  Pt has been using cane for about a month; prior to this, pt was completely independent for all household and community mobility. Pt denies falls within the past 6 months.    Pertinent History CVA with residual L-sided weakness, OA, osteopenia   Limitations Standing;Walking;House hold activities   Patient Stated Goals "To be able to bend my right knee without pain; to walk without the cane."   Currently in Pain? Yes   Pain Score 8    Pain Location Knee   Pain Orientation Right;Medial  inferomedial to R patella   Pain Descriptors / Indicators Sharp   Pain Type Chronic pain   Pain Onset More than a month ago   Pain Frequency Constant   Aggravating Factors  walking, trying to bend the right knee    Pain Relieving Factors rest; sitting; taking acetominophen   Effect of Pain on Daily Activities limits walking tolerance   Multiple Pain Sites No            [facility] PT Assessment - 03/31/15 0001    Assessment   Medical Diagnosis risk for falls   Onset Date/Surgical Date 02/20/15   Hand Dominance Right   Precautions   Precautions Fall   Restrictions   Weight Bearing Restrictions No   Balance Screen   Has the patient fallen in the past 6 months No   Has the patient had a decrease in activity level because of a fear of falling?  Yes   Is the patient reluctant to leave their home because of a fear of falling?  No   Home Environment   Living Environment Other (Comment)  independent living facility   Living Arrangements Alone   Type of Home Apartment   Home Access Level entry   Home Layout One level   Home Equipment Cane - single point   Additional Comments Small threshold/curb step to get into home   Prior Function   Level of Independence Independent;Independent with household mobility without device;Independent with community mobility without device   Vocation Retired   Programmer, multimedia exercise classes for senior citizens at [employer]   Overall Cognitive Status Within Functional Limits for tasks assessed   Sensation   Light Touch Appears Intact   Coordination   Gross Motor Movements are Fluid and Coordinated Yes   Posture/Postural Control   Posture/Postural Control --   ROM / Strength   AROM / PROM / Strength Strength;AROM;PROM   AROM   Overall AROM  Deficits   Overall AROM Comments -5 degrees B hip extension   PROM   Overall PROM  Deficits   Overall PROM Comments B hip extension limited to neutral   Strength   Overall Strength Deficits   Overall Strength Comments B hip flexion 4-/5;R hip ABD 3-/5; L hip ABD 2+/5.    Ambulation/Gait   Assistive device Straight cane   Gait velocity 1.85 ft/sec   Standardized Balance Assessment   Standardized Balance  Assessment Berg Balance Test   Berg Balance Test   Sit to Stand Able to stand  independently using hands   Standing Unsupported Able to stand safely 2 minutes   Sitting with Back Unsupported but Feet Supported on Floor or Stool Able to sit safely and securely 2 minutes   Stand to Sit Controls descent by using hands   Transfers Able to transfer safely, definite need of hands   Standing Unsupported with Eyes Closed Able to stand 10 seconds with supervision   Standing Ubsupported with Feet  Together Able to place feet together independently and stand 1 minute safely   From Standing, Reach Forward with Outstretched Arm Can reach forward >12 cm safely (5")   From Standing Position, Pick up Object from Floor Able to pick up shoe safely and easily   From Standing Position, Turn to Look Behind Over each Shoulder Turn sideways only but maintains balance   Turn 360 Degrees Able to turn 360 degrees safely but slowly   Standing Unsupported, Alternately Place Feet on Step/Stool Able to complete 4 steps without aid or supervision   Standing Unsupported, One Foot in Front Able to plae foot ahead of the other independently and hold 30 seconds   Standing on One Leg Tries to lift leg/unable to hold 3 seconds but remains standing independently   Total Score 41                   [facility] Adult PT Treatment/Exercise - 03/31/15 0001    Transfers   Transfers Sit to Stand;Stand to Sit   Sit to Stand 6: Modified independent (Device/Increase time)   Sit to Stand Details (indicate cue type and reason) requires UE assist to stand from standard chair   Stand to Sit 6: Modified independent (Device/Increase time)  requires UE assist to sit in standard chair   Ambulation/Gait   Ambulation/Gait Yes   Ambulation/Gait Assistance 5: Supervision   Ambulation Distance (Feet) 200 Feet   Gait Pattern Step-through pattern;Decreased step length - left;Decreased weight shift to right;Decreased hip/knee flexion -  right;Trunk flexed;Decreased stance time - right  does not reach B hip extension throughout gait cycle   Ambulation Surface Level;Indoor                PT Education - 03/31/15 2110    Education provided Yes   Education Details PT eval findings, goals, and POC.   Person(s) Educated Patient   Methods Explanation   Comprehension Verbalized understanding          PT Short Term Goals - 03/31/15 2122    PT SHORT TERM GOAL #1   Title Pt will perform initial HEP with mod I using paper handout to maximize functional gains made in PT. Target date: 04/28/15   PT SHORT TERM GOAL #2   Title Pt will improve self-selected gait speed from 1.85 ft/sec to 2.15 ft/sec to indicate increased efficiency of ambulation. Target date: 04/28/15   PT SHORT TERM GOAL #3   Title Pt will increase Berg score from 42 to 45/56 to indicate decreased fall risk. Target date: 04/28/15   PT SHORT TERM GOAL #4   Title Pt will negotiate standard curb step with mod I using LRAD to enable pt to traverse community obstacles, to safely access primary home entrance. Target date: 04/28/15           PT Long Term Goals - 03/31/15 2129    PT LONG TERM GOAL #1   Title Pt will increase self-selected gait speed from 1.85 ft/sec to 2.45 ft/sec to indicate increased efficiency of ambulation. Target date: 05/26/15   PT LONG TERM GOAL #2   Title Pt will imrpove Berg score from 4 2 to 48/56 to indicate significant improvement in functional standing balance.   Target date: 05/26/15   PT LONG TERM GOAL #3   Title Pt will ambulate x1,000' consecutively over unlevel, paved surfaces wth mod I using LRAD to indicate improved walking tolerance, increase safety with community mobility. Target date: 05/26/15   PT LONG TERM  GOAL #4   Title Pt will ambulate x100' over level, indoor surfaces without AD with mod I (for increased time) to progress toward PLOF. Target date: 05/26/15   PT LONG TERM GOAL #5   Title Pt will perform all above  activities with no more than a 2-point increase in within-session pain rating to indicate decreased impact of pain on functional activity tolerance.  Target date: 05/26/15               Plan - April 09, 2015 10/28/2113    Clinical Impression Statement Pt is a 74 y/o F referred to outpatient PT to address risk of falling. Prior to 1 month ago, pt was completely independence with all functional mobility without assistive deivce. Pt currently uses SPC for all household and community mobility. PT evaluation reveals the following: BLE weakness (L > R); gait impairments; Berg score suggestive of fall risk; gait speed indicative of status of limited community ambulator; limited B hip ROM; R knee pain. Pt will benefit from skilled outpatient 2x/week for 8 weeks to address said impairments.   Pt will benefit from skilled therapeutic intervention in order to improve on the following deficits Abnormal gait;Decreased range of motion;Decreased activity tolerance;Decreased balance;Decreased mobility;Decreased knowledge of use of DME;Decreased endurance;Decreased strength;Increased edema;Pain;Impaired flexibility   Rehab Potential Good   PT Frequency 2x / week   PT Duration 8 weeks   PT Treatment/Interventions ADLs/Self Care Home Management;Functional mobility training;Gait training;DME Instruction;Stair training;Patient/family education;Neuromuscular re-education;Therapeutic exercise;Therapeutic activities;Vestibular;Manual techniques;Balance training   PT Next Visit Plan Control R knee pain. Initiate HEP. Assess outdoor mobility, curb step negotiation with SPC.   PT Home Exercise Plan Stretch hip flexors; strengthen hip extensors/ABD.   Consulted and Agree with Plan of Care Patient          G-Codes - 09-Apr-2015 2106/10/29    Functional Assessment Tool Used Sharlene Motts 42/56   Functional Limitation Mobility: Walking and moving around   Mobility: Walking and Moving Around Current Status [phone]) At least 40 percent but less than  60 percent impaired, limited or restricted   Mobility: Walking and Moving Around Goal Status [phone]) At least 20 percent but less than 40 percent impaired, limited or restricted       Problem List Patient Active Problem List   Diagnosis Date Noted  . Risk for falls 03/03/2015  . Healthcare maintenance 04/08/2014  . History of cerebrovascular accident 05/29/2013  . Vitamin D deficiency 05/08/2010  . Osteopenia 04/03/2009  . Bilateral leg edema due to diastolic dysfunction 02/22/2007  . Essential hypertension 09/14/2006  . Osteoarthritis 04/27/2006  . History of PSVT (paroxysmal supraventricular tachycardia) 04/27/2006    Jorje Guild, PT, DPT [facility] [address] [place], [place], [address] Phone: [phone]   Fax:  [phone] 2015-04-09, 9:42 PM

## 2015-04-07 ENCOUNTER — Ambulatory Visit: Payer: Medicare Other | Admitting: Physical Therapy

## 2015-04-07 ENCOUNTER — Encounter: Payer: Self-pay | Admitting: Physical Therapy

## 2015-04-07 DIAGNOSIS — R269 Unspecified abnormalities of gait and mobility: Secondary | ICD-10-CM

## 2015-04-07 DIAGNOSIS — R2681 Unsteadiness on feet: Secondary | ICD-10-CM

## 2015-04-07 DIAGNOSIS — M25561 Pain in right knee: Secondary | ICD-10-CM

## 2015-04-07 DIAGNOSIS — R531 Weakness: Secondary | ICD-10-CM

## 2015-04-07 NOTE — Therapy (Signed)
[facility] [address] [place], [place], [address] Phone: [phone]   Fax:  [phone]  Physical Therapy Treatment  Patient Details  Name: Carla JollyLura M Pomales MRN: [id number] Date of Birth: 30-Mar-1941 No Data Recorded  Encounter Date: 04/07/2015      PT End of Session - 04/07/15 1324    Visit Number 2   Number of Visits 17  eval + 16 visits   Date for PT Re-Evaluation 05/30/15   Authorization Type U[employer] Medicare - G Codes every 10 visits   PT Start Time 1318   PT Stop Time 1400   PT Time Calculation (min) 42 min   Activity Tolerance Patient tolerated treatment well   Behavior During Therapy [facility] for tasks assessed/performed      Past Medical History  Diagnosis Date  . Hypertension   . Hyperlipemia   . DJD (degenerative joint disease)   . Chronic low back pain   . Abnormal electromyogram (EMG)     Low grade right S1 radiculopathy 7/93.  [name]. Hot flashes   . Rotator cuff syndrome of right shoulder     S/P right shoulder arthroscopic debridement of a massive rotator cuff tear, greater tuberosity and resection of subacromial spur by Dr. [name] 07/07/2005.  [name]. Herniated lumbar intervertebral disc     S/P L4-5 & L5-S1 laminotomy, foraminotomy, and L5-S1 diskectomy by Dr. [name] 11/01/2000.  . Multiple joint pain   . Iron deficiency anemia   . Vitamin D deficiency   . SVT (supraventricular tachycardia) (HCC)     PSVT  . Bilateral leg edema   . Peripheral neuropathy (HCC)   . Osteopenia     A DEXA bone density scan done 02/13/2009 showed a left femur neck young adult T-score of -1.6 (osteopenia), a right femur neck young adult T-score of -1.6 (osteopenia), and an AP spine young adult T-score of 1.3 (normal).  Her FRAX score gave an estimated 10 year probability of 6.6% for major osteoporotic fracture and 0.8% for hip fracture.  . Seasonal allergic rhinitis   . Elevated alkaline phosphatase level     Bone scan  08/2004 showed no evidence of abnormal bony activity..  . Skin tag     Right axillary area.  . Hypokalemia   . Headache(784.0)   . Diastolic dysfunction     a. 08/2012 Echo: EF 60-65%, Gr 2 DD, triv MR, PASP 44mmHg.  . Chest pain     a. reports nl cath in the 1990's.  . Stroke [facility](HCC) 04/2013    Past Surgical History  Procedure Laterality Date  . Total abdominal hysterectomy  1992  . Lumbar disc surgery  11/01/2000     L4-5, L5-S1 laminotomy, foraminotomy and L5-S1 diskectomy  . Shoulder arthroscopy  1/172007    S/P right shoulder arthroscopic debridement of a massive rotator cuff tear, greater tuberoplasty and resection of subacromial spur by Dr. [name] on 07/07/2005.  [name]. Cholecystectomy, laparoscopic  1998  . Cholecystectomy      There were no vitals filed for this visit.  Visit Diagnosis:  Abnormality of gait  Unsteadiness  Pain in knee joint, right  Generalized weakness      Subjective Assessment - 04/07/15 1322    Subjective No new complaints. Still having some knee pain.    Currently in Pain? Yes   Pain Score 8    Pain Location Knee   Pain Orientation Right;Medial   Pain Descriptors / Indicators Sharp;Aching;Discomfort   Pain Type  Chronic pain   Pain Onset More than a month ago   Pain Frequency Constant   Aggravating Factors  increased activity, bending of knee   Pain Relieving Factors sitting, rest, tylenol          [facility] Adult PT Treatment/Exercise - 04/07/15 1343    Transfers   Sit to Stand 6: Modified independent (Device/Increase time);With upper extremity assist;From bed   Stand to Sit 6: Modified independent (Device/Increase time);With upper extremity assist;To bed   Ambulation/Gait   Ambulation/Gait Yes   Ambulation/Gait Assistance 4: Min guard;4: Min assist   Ambulation/Gait Assistance Details increased assist needed on outdoor compliant/uneven surfaces due to increased sway/veering noted. loss of balance x 2 needing assist to correct.    Ambulation Distance (Feet) 350 Feet   Assistive device Straight cane   Gait Pattern Step-through pattern;Decreased step length - left;Decreased weight shift to right;Decreased hip/knee flexion - right;Trunk flexed;Decreased stance time - right   Ambulation Surface Level;Unlevel;Indoor;Outdoor;Paved;Gravel;Grass   High Level Balance   High Level Balance Activities Head turns  enviroment scanning for playing cards on walls   High Level Balance Comments gait on track with enviroment scanning with min guard assist to min assist for balance, cues to maintain gait speed/pattern. scanning walls for playing cards with min guard to min assist with cues on posture, gait sequence while scanning and to maintain gait speed. cane used with both these activities.                                   Exercises: refer to pt instructions for full details.       PT Education - 04/07/15 1433    Education provided Yes   Education Details HEP: for leg strengthening and hip flexor stretching   Person(s) Educated Patient   Methods Explanation;Demonstration;Handout   Comprehension Verbalized understanding;Need further instruction;Returned demonstration          PT Short Term Goals - 03/31/15 2122    PT SHORT TERM GOAL #1   Title Pt will perform initial HEP with mod I using paper handout to maximize functional gains made in PT. Target date: 04/28/15   PT SHORT TERM GOAL #2   Title Pt will improve self-selected gait speed from 1.85 ft/sec to 2.15 ft/sec to indicate increased efficiency of ambulation. Target date: 04/28/15   PT SHORT TERM GOAL #3   Title Pt will increase Berg score from 42 to 45/56 to indicate decreased fall risk. Target date: 04/28/15   PT SHORT TERM GOAL #4   Title Pt will negotiate standard curb step with mod I using LRAD to enable pt to traverse community obstacles, to safely access primary home entrance. Target date: 04/28/15           PT Long Term Goals - 03/31/15 2129    PT LONG TERM  GOAL #1   Title Pt will increase self-selected gait speed from 1.85 ft/sec to 2.45 ft/sec to indicate increased efficiency of ambulation. Target date: 05/26/15   PT LONG TERM GOAL #2   Title Pt will imrpove Berg score from 4 2 to 48/56 to indicate significant improvement in functional standing balance.   Target date: 05/26/15   PT LONG TERM GOAL #3   Title Pt will ambulate x1,000' consecutively over unlevel, paved surfaces wth mod I using LRAD to indicate improved walking tolerance, increase safety with community mobility. Target date: 05/26/15   PT LONG TERM GOAL #  4   Title Pt will ambulate x100' over level, indoor surfaces without AD with mod I (for increased time) to progress toward PLOF. Target date: 05/26/15   PT LONG TERM GOAL #5   Title Pt will perform all above activities with no more than a 2-point increase in within-session pain rating to indicate decreased impact of pain on functional activity tolerance.  Target date: 05/26/15           Plan - 04/07/15 1324    Clinical Impression Statement Educated pt on and issued HEP today for hip flexor stretcing and lower leg strengthening  without any issues reported. Pt does need increased time for all mobility/position changes. Increased assistance on outdoor surfaces needed for balance. Pt making steady progress toward goals.                                 Pt will benefit from skilled therapeutic intervention in order to improve on the following deficits Abnormal gait;Decreased range of motion;Decreased activity tolerance;Decreased balance;Decreased mobility;Decreased knowledge of use of DME;Decreased endurance;Decreased strength;Increased edema;Pain;Impaired flexibility   Rehab Potential Good   PT Frequency 2x / week   PT Duration 8 weeks   PT Treatment/Interventions ADLs/Self Care Home Management;Functional mobility training;Gait training;DME Instruction;Stair training;Patient/family education;Neuromuscular re-education;Therapeutic  exercise;Therapeutic activities;Vestibular;Manual techniques;Balance training   PT Next Visit Plan gait with cane, lower leg strengthening and balance at counter top (mindful of knee pain);advance HEP as needed   PT Home Exercise Plan 04/07/15: bridge, hip flexor stretch off edge of bed, supine hip abduction/ER with red band   Consulted and Agree with Plan of Care Patient        Problem List Patient Active Problem List   Diagnosis Date Noted  . Risk for falls 03/03/2015  . Healthcare maintenance 04/08/2014  . History of cerebrovascular accident 05/29/2013  . Vitamin D deficiency 05/08/2010  . Osteopenia 04/03/2009  . Bilateral leg edema due to diastolic dysfunction 02/22/2007  . Essential hypertension 09/14/2006  . Osteoarthritis 04/27/2006  . History of PSVT (paroxysmal supraventricular tachycardia) 04/27/2006    Sallyanne Kuster 04/07/2015, 2:33 PM  Sallyanne Kuster, PTA, [facility] Outpatient Neuro [facility] [address] [place], [place] [address] [phone] 04/07/2015, 2:33 PM   Name: SINIA ANTOSH MRN: [id number] Date of Birth: [birth year]

## 2015-04-07 NOTE — Patient Instructions (Signed)
Bridging    Slowly raise buttocks from floor, keeping stomach tight. Hold for 5 seconds. Repeat __10__ times per set. Do _1__ sets per session. Do _1-2___ sessions per day.  http://orth.exer.us/1096   Copyright  VHI. All rights reserved.  Hip Flexor Stretch    Lying on back near edge of bed, bend one leg, foot flat. Hang other leg over edge, relaxed, thigh resting entirely on bed for 30 seconds. Repeat _3___ times each side. Do __1-2__ sessions per day. Advanced Exercise: Bend knee back keeping thigh in contact with bed.  http://gt2.exer.us/347   Copyright  VHI. All rights reserved.  Strengthening: Hip Abductor - Resisted    With band looped around both legs above knees, push thighs apart. Hold for 5 seconds.  Repeat __10 times per set. Do _1_ sets per session. Do __1-2__ sessions per day.  http://orth.exer.us/688   Copyright  VHI. All rights reserved.

## 2015-04-10 ENCOUNTER — Other Ambulatory Visit: Payer: Self-pay | Admitting: Internal Medicine

## 2015-04-10 ENCOUNTER — Other Ambulatory Visit: Payer: Self-pay | Admitting: Student in an Organized Health Care Education/Training Program

## 2015-04-11 ENCOUNTER — Ambulatory Visit: Payer: Medicare Other | Admitting: Physical Therapy

## 2015-04-15 ENCOUNTER — Other Ambulatory Visit: Payer: Self-pay | Admitting: Internal Medicine

## 2015-04-15 ENCOUNTER — Ambulatory Visit: Payer: Medicare Other | Admitting: Physical Therapy

## 2015-04-15 ENCOUNTER — Encounter: Payer: Self-pay | Admitting: Physical Therapy

## 2015-04-15 DIAGNOSIS — R531 Weakness: Secondary | ICD-10-CM

## 2015-04-15 DIAGNOSIS — R2681 Unsteadiness on feet: Secondary | ICD-10-CM

## 2015-04-15 DIAGNOSIS — R269 Unspecified abnormalities of gait and mobility: Secondary | ICD-10-CM

## 2015-04-15 DIAGNOSIS — M25561 Pain in right knee: Secondary | ICD-10-CM

## 2015-04-15 NOTE — Therapy (Signed)
[facility] [address] [place], [place], [address] Phone: [phone]   Fax:  [phone]  Physical Therapy Treatment  Patient Details  Name: [name] MRN: [id number] Date of Birth: [birth year] No Data Recorded  Encounter Date: 04/15/2015      PT End of Session - 04/15/15 1408    Visit Number 3   Number of Visits 17  eval + 16 visits   Date for PT Re-Evaluation 05/30/15   Authorization Type UHC Medicare - G Codes every 10 visits   PT Start Time 1404   PT Stop Time 1444   PT Time Calculation (min) 40 min   Activity Tolerance Patient tolerated treatment well   Behavior During Therapy [facility] for tasks assessed/performed      Past Medical History  Diagnosis Date  . Hypertension   . Hyperlipemia   . DJD (degenerative joint disease)   . Chronic low back pain   . Abnormal electromyogram (EMG)     Low grade right S1 radiculopathy 7/93.  [name] Hot flashes   . Rotator cuff syndrome of right shoulder     S/P right shoulder arthroscopic debridement of a massive rotator cuff tear, greater tuberosity and resection of subacromial spur by Dr. Gean Birchwood 07/07/2005.  [name] Herniated lumbar intervertebral disc     S/P L4-5 & L5-S1 laminotomy, foraminotomy, and L5-S1 diskectomy by Dr. Trey Sailors 11/01/2000.  . Multiple joint pain   . Iron deficiency anemia   . Vitamin D deficiency   . SVT (supraventricular tachycardia) (HCC)     PSVT  . Bilateral leg edema   . Peripheral neuropathy (HCC)   . Osteopenia     A DEXA bone density scan done 02/13/2009 showed a left femur neck young adult T-score of -1.6 (osteopenia), a right femur neck young adult T-score of -1.6 (osteopenia), and an AP spine young adult T-score of 1.3 (normal).  Her FRAX score gave an estimated 10 year probability of 6.6% for major osteoporotic fracture and 0.8% for hip fracture.  . Seasonal allergic rhinitis   . Elevated alkaline phosphatase level     Bone scan  08/2004 showed no evidence of abnormal bony activity..  . Skin tag     Right axillary area.  . Hypokalemia   . Headache(784.0)   . Diastolic dysfunction     a. 08/2012 Echo: EF 60-65%, Gr 2 DD, triv MR, PASP .  . Chest pain     a. reports nl cath in the 1990's.  . Stroke [facility]) 04/2013    Past Surgical History  Procedure Laterality Date  . Total abdominal hysterectomy  1992  . Lumbar disc surgery  11/01/2000     L4-5, L5-S1 laminotomy, foraminotomy and L5-S1 diskectomy  . Shoulder arthroscopy  1/172007    S/P right shoulder arthroscopic debridement of a massive rotator cuff tear, greater tuberoplasty and resection of subacromial spur by Dr. Gean Birchwood on 07/07/2005.  [name] Cholecystectomy, laparoscopic  1998  . Cholecystectomy      There were no vitals filed for this visit.  Visit Diagnosis:  Abnormality of gait  Unsteadiness  Pain in knee joint, right  Generalized weakness      Subjective Assessment - 04/15/15 1407    Subjective No new complaints. No falls to report. Still with some right knee pain.   Patient Stated Goals "To be able to bend my right knee without pain; to walk without the cane."   Currently in Pain? Yes   Pain  Score 7    Pain Location Knee   Pain Orientation Right;Medial   Pain Descriptors / Indicators Sore   Pain Type Chronic pain   Pain Onset More than a month ago   Pain Frequency Constant   Aggravating Factors  increased activity, bending of knee   Pain Relieving Factors sitting, rest, tylenol     Treatment: Exercises: Reviewed HEP and provided cues as needed: Bridge 5 sec x 10 reps. Cues on form and correct hold times. Hip abduction/ER with red band, 5 sec hold x 10 reps. Alternating marching x 10 reps each leg with emphasis on controled motions Sit <>stand x 10 reps with light UE support at edge of mat, cues on full upright posture  Neuro Re-ed: Single leg stance with tall cones on floor: pt needed min guard assist to min assist for  balance without UE support. Alternating fwd toe taps, cross toe taps, and alternating fwd double toe taps x 10 each bil legs. Cues on posture, weight shifitng and slowing down for increased control.         [facility] Adult PT Treatment/Exercise - 04/15/15 1422    Ambulation/Gait   Ambulation/Gait Yes   Ambulation/Gait Assistance 4: Min guard;5: Supervision   Ambulation Distance (Feet) 600 Feet  or more   Assistive device Straight cane   Gait Pattern Step-through pattern;Decreased stride length;Decreased step length - right;Decreased step length - left;Narrow base of support;Trunk flexed   Ambulation Surface Level;Unlevel;Indoor;Outdoor;Paved;Grass   Ramp 5: Supervision  x 1 indoors with cane   Curb 5: Supervision  x1 outdoors (~4 inch) and x 1 indoors (6 inch)            PT Short Term Goals - 03/31/15 2122    PT SHORT TERM GOAL #1   Title Pt will perform initial HEP with mod I using paper handout to maximize functional gains made in PT. Target date: 04/28/15   PT SHORT TERM GOAL #2   Title Pt will improve self-selected gait speed from 1.85 ft/sec to 2.15 ft/sec to indicate increased efficiency of ambulation. Target date: 04/28/15   PT SHORT TERM GOAL #3   Title Pt will increase Berg score from 42 to 45/56 to indicate decreased fall risk. Target date: 04/28/15   PT SHORT TERM GOAL #4   Title Pt will negotiate standard curb step with mod I using LRAD to enable pt to traverse community obstacles, to safely access primary home entrance. Target date: 04/28/15           PT Long Term Goals - 03/31/15 2129    PT LONG TERM GOAL #1   Title Pt will increase self-selected gait speed from 1.85 ft/sec to 2.45 ft/sec to indicate increased efficiency of ambulation. Target date: 05/26/15   PT LONG TERM GOAL #2   Title Pt will imrpove Berg score from 4 2 to 48/56 to indicate significant improvement in functional standing balance.   Target date: 05/26/15   PT LONG TERM GOAL #3   Title Pt will  ambulate x1,000' consecutively over unlevel, paved surfaces wth mod I using LRAD to indicate improved walking tolerance, increase safety with community mobility. Target date: 05/26/15   PT LONG TERM GOAL #4   Title Pt will ambulate x100' over level, indoor surfaces without AD with mod I (for increased time) to progress toward PLOF. Target date: 05/26/15   PT LONG TERM GOAL #5   Title Pt will perform all above activities with no more than a 2-point increase in  within-session pain rating to indicate decreased impact of pain on functional activity tolerance.  Target date: 05/26/15               Plan - 04/15/15 1409    Pt will benefit from skilled therapeutic intervention in order to improve on the following deficits Abnormal gait;Decreased range of motion;Decreased activity tolerance;Decreased balance;Decreased mobility;Decreased knowledge of use of DME;Decreased endurance;Decreased strength;Increased edema;Pain;Impaired flexibility   Rehab Potential Good   PT Frequency 2x / week   PT Duration 8 weeks   PT Treatment/Interventions ADLs/Self Care Home Management;Functional mobility training;Gait training;DME Instruction;Stair training;Patient/family education;Neuromuscular re-education;Therapeutic exercise;Therapeutic activities;Vestibular;Manual techniques;Balance training   PT Next Visit Plan gait with cane, lower leg strengthening and balance at counter top (mindful of knee pain);advance HEP as needed   PT Home Exercise Plan 04/07/15: bridge, hip flexor stretch off edge of bed, supine hip abduction/ER with red band   Consulted and Agree with Plan of Care Patient        Problem List Patient Active Problem List   Diagnosis Date Noted  . Risk for falls 03/03/2015  . Healthcare maintenance 04/08/2014  . History of cerebrovascular accident 05/29/2013  . Vitamin D deficiency 05/08/2010  . Osteopenia 04/03/2009  . Bilateral leg edema due to diastolic dysfunction 02/22/2007  . Essential  hypertension 09/14/2006  . Osteoarthritis 04/27/2006  . History of PSVT (paroxysmal supraventricular tachycardia) 04/27/2006    Sallyanne KusterBury, Saphire Barnhart 04/16/2015, 3:21 PM  Sallyanne KusterKathy Velma Hanna, PTA, [facility] [address], [address] [place], [place] [address] [phone] 04/16/2015, 3:21 PM   Name: Carla JollyLura M Cook MRN: [id number] Date of Birth: [birth year]

## 2015-04-18 ENCOUNTER — Ambulatory Visit: Payer: Medicare Other | Admitting: Physical Therapy

## 2015-04-18 DIAGNOSIS — M545 Low back pain, unspecified: Secondary | ICD-10-CM

## 2015-04-18 DIAGNOSIS — R269 Unspecified abnormalities of gait and mobility: Secondary | ICD-10-CM | POA: Diagnosis not present

## 2015-04-18 NOTE — Therapy (Signed)
[facility] [address] [place], [place], [address] Phone: [phone]   Fax:  [phone]  Physical Therapy Treatment  Patient Details  Name: STARKISHA TULLIS MRN: [id number] Date of Birth: 07/16/1940 No Data Recorded  Encounter Date: 04/18/2015      PT End of Session - 04/18/15 1301    Visit Number 4   Number of Visits 17   Date for PT Re-Evaluation 05/30/15   Authorization Type [employer] Medicare - G Codes every 10 visits   PT Start Time 1154   PT Stop Time 1232   PT Time Calculation (min) 38 min   Activity Tolerance Patient limited by pain   Behavior During Therapy [facility] for tasks assessed/performed      Past Medical History  Diagnosis Date  . Hypertension   . Hyperlipemia   . DJD (degenerative joint disease)   . Chronic low back pain   . Abnormal electromyogram (EMG)     Low grade right S1 radiculopathy 7/93.  [name] Hot flashes   . Rotator cuff syndrome of right shoulder     S/P right shoulder arthroscopic debridement of a massive rotator cuff tear, greater tuberosity and resection of subacromial spur by Dr. Gean Birchwood 07/07/2005.  [name] Herniated lumbar intervertebral disc     S/P L4-5 & L5-S1 laminotomy, foraminotomy, and L5-S1 diskectomy by Dr. Trey Sailors 11/01/2000.  . Multiple joint pain   . Iron deficiency anemia   . Vitamin D deficiency   . SVT (supraventricular tachycardia) (HCC)     PSVT  . Bilateral leg edema   . Peripheral neuropathy (HCC)   . Osteopenia     A DEXA bone density scan done 02/13/2009 showed a left femur neck young adult T-score of -1.6 (osteopenia), a right femur neck young adult T-score of -1.6 (osteopenia), and an AP spine young adult T-score of 1.3 (normal).  Her FRAX score gave an estimated 10 year probability of 6.6% for major osteoporotic fracture and 0.8% for hip fracture.  . Seasonal allergic rhinitis   . Elevated alkaline phosphatase level     Bone scan 08/2004 showed no evidence of  abnormal bony activity..  . Skin tag     Right axillary area.  . Hypokalemia   . Headache(784.0)   . Diastolic dysfunction     a. 08/2012 Echo: EF 60-65%, Gr 2 DD, triv MR, PASP .  . Chest pain     a. reports nl cath in the 1990's.  . Stroke [facility]) 04/2013    Past Surgical History  Procedure Laterality Date  . Total abdominal hysterectomy  1992  . Lumbar disc surgery  11/01/2000     L4-5, L5-S1 laminotomy, foraminotomy and L5-S1 diskectomy  . Shoulder arthroscopy  1/172007    S/P right shoulder arthroscopic debridement of a massive rotator cuff tear, greater tuberoplasty and resection of subacromial spur by Dr. Gean Birchwood on 07/07/2005.  [name] Cholecystectomy, laparoscopic  1998  . Cholecystectomy      There were no vitals filed for this visit.  Visit Diagnosis:  Acute right-sided low back pain without sciatica  Abnormality of gait      Subjective Assessment - 04/18/15 1158    Subjective Pt arrived to therapy session ambulating without SPC. Reports she has been walking without cane since Sunday. Denies falls. States, "I'm having trouble standing up straight." Reports recent onset of significant pain in R aspect of lower back.   Pertinent History CVA with residual L-sided weakness, OA, osteopenia  Limitations Standing;Walking;House hold activities   Patient Stated Goals "To be able to bend my right knee without pain; to walk without the cane."   Currently in Pain? Yes   Pain Score 8    Pain Location Back   Pain Orientation Right   Pain Descriptors / Indicators Sharp   Pain Type Acute pain   Pain Onset In the past 7 days   Pain Frequency Constant   Aggravating Factors  getting in and out of bed   Pain Relieving Factors sitting still   Effect of Pain on Daily Activities "Can't stand up straight"   Multiple Pain Sites --            [facility] PT Assessment - 04/18/15 0001    Palpation   Palpation comment Upon palpation of L1-2, noted R transverse process very prominent  at  beginning of session; grade 1 P/A of R transverse process reproduced concordant pain. Post-session, transverse process no longer prominent but pt stil reporting tenderness in surrounding musculature with palpation.                     [facility] Adult PT Treatment/Exercise - 04/18/15 0001    Bed Mobility   Bed Mobility Rolling Right;Rolling Left;Supine to Sit;Sit to Supine   Rolling Right 4: Min assist   Rolling Right Details (indicate cue type and reason) cueing for logroll technique due to significant pain   Rolling Left 4: Min assist   Rolling Left Details (indicate cue type and reason) cueing for logroll technique due to significant pain   Supine to Sit 3: Mod assist   Supine to Sit Details (indicate cue type and reason) cueing for logroll technique due to significant pain   Sit to Supine 3: Mod assist   Sit to Supine - Details (indicate cue type and reason) cueing for logroll technique due to significant pain   Transfers   Transfers Sit to Stand   Sit to Stand 5: Supervision   Sit to Stand Details (indicate cue type and reason) due to back pain   Stand to Sit 5: Supervision   Stand to Sit Details due to back pain   Ambulation/Gait   Ambulation/Gait Yes   Ambulation/Gait Assistance 5: Supervision   Ambulation Distance (Feet) 150 Feet  x2   Assistive device None   Gait Pattern Step-through pattern;Decreased stride length;Decreased step length - right;Decreased step length - left;Narrow base of support;Trunk flexed  limited B hip extension throughout gait cycle   Ambulation Surface Level;Indoor   Manual Therapy   Manual Therapy Manual Traction   Manual Traction Supine: lumbar spine traction via sheet positioned under B hips 10 x10-sec holds; supine hook lying thoracolumbar traction using belt behind B knees 5 x 20-sec holds. Initially, pt with pain relief only during manual traction.                PT Education - 04/18/15 1237    Education provided Yes    Education Details Logroll technique with bed mobility; core activation during transitional movements.   Person(s) Educated Patient   Methods Explanation;Demonstration;Tactile cues;Verbal cues   Comprehension Verbalized understanding          PT Short Term Goals - 03/31/15 2122    PT SHORT TERM GOAL #1   Title Pt will perform initial HEP with mod I using paper handout to maximize functional gains made in PT. Target date: 04/28/15   PT SHORT TERM GOAL #2   Title Pt will improve  self-selected gait speed from 1.85 ft/sec to 2.15 ft/sec to indicate increased efficiency of ambulation. Target date: 04/28/15   PT SHORT TERM GOAL #3   Title Pt will increase Berg score from 42 to 45/56 to indicate decreased fall risk. Target date: 04/28/15   PT SHORT TERM GOAL #4   Title Pt will negotiate standard curb step with mod I using LRAD to enable pt to traverse community obstacles, to safely access primary home entrance. Target date: 04/28/15           PT Long Term Goals - 03/31/15 2129    PT LONG TERM GOAL #1   Title Pt will increase self-selected gait speed from 1.85 ft/sec to 2.45 ft/sec to indicate increased efficiency of ambulation. Target date: 05/26/15   PT LONG TERM GOAL #2   Title Pt will imrpove Berg score from 4 2 to 48/56 to indicate significant improvement in functional standing balance.   Target date: 05/26/15   PT LONG TERM GOAL #3   Title Pt will ambulate x1,000' consecutively over unlevel, paved surfaces wth mod I using LRAD to indicate improved walking tolerance, increase safety with community mobility. Target date: 05/26/15   PT LONG TERM GOAL #4   Title Pt will ambulate x100' over level, indoor surfaces without AD with mod I (for increased time) to progress toward PLOF. Target date: 05/26/15   PT LONG TERM GOAL #5   Title Pt will perform all above activities with no more than a 2-point increase in within-session pain rating to indicate decreased impact of pain on functional activity  tolerance.  Target date: 05/26/15               Plan - 04/18/15 1301    Clinical Impression Statement Pt arrived to today's session with significant pain in lower back which began 3 days ago; pt denies acute injury and is unaware of mechanism. Due to pain impacting pt safety with functional mobility, session focused on assessing/addressing said pain. Brief assessment revealed prominent R transverse process at area of L1-2; concordant pain reproduced wiuth grade 1 mobilization. Pt reported moderate pain relief after manual traction.   Pt will benefit from skilled therapeutic intervention in order to improve on the following deficits Abnormal gait;Decreased range of motion;Decreased activity tolerance;Decreased balance;Decreased mobility;Decreased knowledge of use of DME;Decreased endurance;Decreased strength;Increased edema;Pain;Impaired flexibility   Rehab Potential Good   PT Frequency 2x / week   PT Duration 8 weeks   PT Treatment/Interventions ADLs/Self Care Home Management;Functional mobility training;Gait training;DME Instruction;Stair training;Patient/family education;Neuromuscular re-education;Therapeutic exercise;Therapeutic activities;Vestibular;Manual techniques;Balance training   PT Next Visit Plan Ask about lumbar spne pain; continue lower leg strengthening and balance at counter top (mindful of knee pain);advance HEP as needed   Consulted and Agree with Plan of Care Patient        Problem List Patient Active Problem List   Diagnosis Date Noted  . Risk for falls 03/03/2015  . Healthcare maintenance 04/08/2014  . History of cerebrovascular accident 05/29/2013  . Vitamin D deficiency 05/08/2010  . Osteopenia 04/03/2009  . Bilateral leg edema due to diastolic dysfunction 02/22/2007  . Essential hypertension 09/14/2006  . Osteoarthritis 04/27/2006  . History of PSVT (paroxysmal supraventricular tachycardia) 04/27/2006    Jorje GuildBlair Hobble, PT, DPT [facility] [address] [place], [place], [address] Phone: [phone]   Fax:  [phone] 04/18/2015, 1:06 PM   Name: Oneita JollyLura M Harland MRN: [id number] Date of Birth: Oct 30, 1940

## 2015-04-22 ENCOUNTER — Ambulatory Visit: Payer: Medicare Other | Admitting: Physical Therapy

## 2015-04-24 ENCOUNTER — Ambulatory Visit
Payer: Medicare Other | Attending: Student in an Organized Health Care Education/Training Program | Admitting: Physical Therapy

## 2015-04-24 DIAGNOSIS — R269 Unspecified abnormalities of gait and mobility: Secondary | ICD-10-CM | POA: Insufficient documentation

## 2015-04-24 DIAGNOSIS — R531 Weakness: Secondary | ICD-10-CM | POA: Insufficient documentation

## 2015-04-24 DIAGNOSIS — R2681 Unsteadiness on feet: Secondary | ICD-10-CM | POA: Insufficient documentation

## 2015-04-24 DIAGNOSIS — M545 Low back pain: Secondary | ICD-10-CM | POA: Insufficient documentation

## 2015-04-24 DIAGNOSIS — M25561 Pain in right knee: Secondary | ICD-10-CM | POA: Insufficient documentation

## 2015-04-25 ENCOUNTER — Encounter: Payer: Self-pay | Admitting: Internal Medicine

## 2015-04-25 ENCOUNTER — Ambulatory Visit (INDEPENDENT_AMBULATORY_CARE_PROVIDER_SITE_OTHER): Payer: Medicare Other | Admitting: Internal Medicine

## 2015-04-25 VITALS — BP 131/64 | HR 78 | Temp 98.1°F | Ht 67.0 in | Wt 222.8 lb

## 2015-04-25 DIAGNOSIS — S32019A Unspecified fracture of first lumbar vertebra, initial encounter for closed fracture: Secondary | ICD-10-CM | POA: Insufficient documentation

## 2015-04-25 DIAGNOSIS — M545 Low back pain, unspecified: Secondary | ICD-10-CM

## 2015-04-25 MED ORDER — CYCLOBENZAPRINE HCL 5 MG PO TABS: 5.0000 mg | ORAL_TABLET | Freq: Every day | ORAL | Status: DC

## 2015-04-25 NOTE — Assessment & Plan Note (Signed)
Given history and exam, most likely lumbar strain. No h/o falls or trauma. Patient does not have any red flag symptoms.  -Continue acetaminophen 500mg  1-2 pills TID, instructed to take ~1 hour before bed -Start on short course of flexeril 5mg  qhs -Continue PT

## 2015-04-25 NOTE — Progress Notes (Signed)
Subjective:    Patient ID: [name], female    DOB: Apr 29, 1941, 74 y.o.   MRN: [id number]  HPI [name] is a 74 y.o. with PMH of osteopenia, vit D deficiency, bilateral LE edema, PSVT, and HTN who presents to [facility] today for R-sided lower back pain that started 2 weeks ago. She says that it occurs when she lies down on her back/side at night to go to bed, and describes it as a pressure in her back, like 'someone punched her and keeps their fist there'. She says her only other symptom is generalized numbness in her R leg only below the knee that occurs when she has the pain lying down. She does not have these symptoms during the day, or when she is doing any activities such as standing up, walking, leaning forward, etc. She denies any history of trauma, fever, night sweats, weight changes, chest pain, shortness of breath, abdominal pain, changes in bowel or bladder function, or weakness. She has tried taking acetaminophen on 3 different occasions during the day, to no relief, but has not tried anything else. Please see problem-based charting for further pertinent information.   Review of Systems  Constitutional: Negative for fever, chills and diaphoresis.  HENT: Negative for ear pain, sinus pressure and sore throat.   Eyes: Negative for visual disturbance.  Respiratory: Negative for cough, shortness of breath and wheezing.   Cardiovascular: Negative for chest pain, palpitations and leg swelling.  Gastrointestinal: Negative for nausea, vomiting, abdominal pain, diarrhea, constipation and blood in stool.  Endocrine: Negative for polyuria.  Genitourinary: Negative for dysuria, urgency, frequency, flank pain, decreased urine volume and difficulty urinating.  Musculoskeletal: Positive for back pain. Negative for gait problem.  Skin: Negative for rash.  Neurological: Positive for numbness. Negative for dizziness, syncope, weakness and headaches.  Hematological: Does not bruise/bleed  easily.  Psychiatric/Behavioral: Negative for confusion and agitation.  All other systems reviewed and are negative.      Objective:   Physical Exam  Constitutional: She is oriented to person, place, and time. She appears well-nourished. No distress.  HENT:  Head: Normocephalic and atraumatic.  Right Ear: External ear normal.  Left Ear: External ear normal.  Eyes: Conjunctivae are normal. Pupils are equal, round, and reactive to light.  Neck: Normal range of motion. Neck supple.  Cardiovascular: Normal rate, regular rhythm, normal heart sounds and intact distal pulses.  Exam reveals no gallop and no friction rub.   No murmur heard. Pulmonary/Chest: Effort normal and breath sounds normal. She has no wheezes. She has no rales.  Abdominal: Soft. Bowel sounds are normal. She exhibits no distension. There is no tenderness.  Musculoskeletal: She exhibits tenderness. She exhibits no edema.  Pain on back extension. However, rest of ROM is preserved without pain, both sitting and standing. Gait is normal and preserved without pain. There is no spinal process point tenderness. There is right lower paravertebral tenderness that does increase the pain moderately with deep palpation. Straight leg test negative, but test unreliable as patient unable to lie entirely flat without pain.  Neurological: She is alert and oriented to person, place, and time. She has normal reflexes. No cranial nerve deficit. Coordination normal.  Skin: Skin is warm. No rash noted. She is not diaphoretic. No erythema.  Psychiatric: She has a normal mood and affect. Her behavior is normal.  Nursing note and vitals reviewed.         Assessment & Plan:  Please see problem-based charting for  assessment and plan.  Reubin MilanBilly Tiyonna Sardinha, MD Resident Physician, PGY-1 Department of Internal Medicine [facility]

## 2015-04-25 NOTE — Patient Instructions (Addendum)
Mrs. [name],  We think that you have a strained muscle in your back. Please take the acetaminophen 1-2 pills about 1 hour before you lie down for bed. You can take take tylenol 1-2 pills up the three times per day as well if you are having pain during the day. Take the flexeril 1 pill at the same time, about an hour before you go to bed. Do not take the flexeril during the day, just the one pill each night. If it is not helping over the next few days, you can stop taking it and let us know. Trying other things to help like icy-hot, a heating pad, or massage may all help as well. Ask your physical therapists to help with exercises to help relieve the strain in your back.  Let us know if your back pain does not improve in the next few weeks, or if it gets worse before then.  Thanks, Reubin MilanBilly Korynne Dols

## 2015-04-27 NOTE — Progress Notes (Signed)
Internal Medicine Clinic Attending  I saw and evaluated the patient.  I personally confirmed the key portions of the history and exam documented by Dr. Kyung RuddKennedy and I reviewed pertinent patient test results.  The assessment, diagnosis, and plan were formulated together and I agree with the documentation in the resident's note.  [name] is doing well today. Her lower back pain is consistent with tendon strain with no red flag symptoms. We will use supportive care. Other problems addressed today included lower extremity edema, which is now much improved since we stopped clonidine and digoxin. Her echo had possible pulmonary hypertension, but I think her edema was mostly medication side effect. We also assessed her blood pressure which is stable since stopping clonidine, so I think we can continue current antihypertensives.

## 2015-04-29 ENCOUNTER — Ambulatory Visit: Payer: Medicare Other | Admitting: Physical Therapy

## 2015-05-01 ENCOUNTER — Encounter: Payer: Self-pay | Admitting: Physical Therapy

## 2015-05-01 ENCOUNTER — Ambulatory Visit: Payer: Medicare Other | Admitting: Physical Therapy

## 2015-05-01 DIAGNOSIS — M25561 Pain in right knee: Secondary | ICD-10-CM

## 2015-05-01 DIAGNOSIS — R531 Weakness: Secondary | ICD-10-CM | POA: Diagnosis present

## 2015-05-01 DIAGNOSIS — M545 Low back pain, unspecified: Secondary | ICD-10-CM

## 2015-05-01 DIAGNOSIS — R269 Unspecified abnormalities of gait and mobility: Secondary | ICD-10-CM

## 2015-05-01 DIAGNOSIS — R2681 Unsteadiness on feet: Secondary | ICD-10-CM

## 2015-05-01 NOTE — Therapy (Signed)
[facility] [address] [place], [place], [address] Phone: [phone]   Fax:  [phone]  Physical Therapy Treatment  Patient Details  Name: [name] MRN: [id number] Date of Birth: [birth year] No Data Recorded  Encounter Date: 05/01/2015      PT End of Session - 05/01/15 1455    Visit Number 5   Number of Visits 17   Date for PT Re-Evaluation 05/30/15   Authorization Type [employer] Medicare - G Codes every 10 visits   PT Start Time 1400   PT Stop Time 1445   PT Time Calculation (min) 45 min   Equipment Utilized During Treatment Gait belt   Activity Tolerance Patient limited by pain   Behavior During Therapy [facility] for tasks assessed/performed      Past Medical History  Diagnosis Date  . Hypertension   . Hyperlipemia   . DJD (degenerative joint disease)   . Chronic low back pain   . Abnormal electromyogram (EMG)     Low grade right S1 radiculopathy 7/93.  [name] Hot flashes   . Rotator cuff syndrome of right shoulder     S/P right shoulder arthroscopic debridement of a massive rotator cuff tear, greater tuberosity and resection of subacromial spur by Dr. Frederik Pear 07/07/2005.  [name] Herniated lumbar intervertebral disc     S/P L4-5 & L5-S1 laminotomy, foraminotomy, and L5-S1 diskectomy by Dr. Glenna Fellows 11/01/2000.  . Multiple joint pain   . Iron deficiency anemia   . Vitamin D deficiency   . SVT (supraventricular tachycardia) (HCC)     PSVT  . Bilateral leg edema   . Peripheral neuropathy ([place])   . Osteopenia     A DEXA bone density scan done 02/13/2009 showed a left femur neck young adult T-score of -1.6 (osteopenia), a right femur neck young adult T-score of -1.6 (osteopenia), and an AP spine young adult T-score of 1.3 (normal).  Her FRAX score gave an estimated 10 year probability of 6.6% for major osteoporotic fracture and 0.8% for hip fracture.  . Seasonal allergic rhinitis   . Elevated alkaline phosphatase  level     Bone scan 08/2004 showed no evidence of abnormal bony activity..  . Skin tag     Right axillary area.  . Hypokalemia   . Headache(784.0)   . Diastolic dysfunction     a. 08/2012 Echo: EF 60-65%, Gr 2 DD, triv MR, PASP 56mHg.  . Chest pain     a. reports nl cath in the 1990's.  . Stroke ([facility] 04/2013    Past Surgical History  Procedure Laterality Date  . Total abdominal hysterectomy  1992  . Lumbar disc surgery  11/01/2000     L4-5, L5-S1 laminotomy, foraminotomy and L5-S1 diskectomy  . Shoulder arthroscopy  1/172007    S/P right shoulder arthroscopic debridement of a massive rotator cuff tear, greater tuberoplasty and resection of subacromial spur by Dr. FFrederik Pearon 07/07/2005.  .Marland KitchenCholecystectomy, laparoscopic  1998  . Cholecystectomy      There were no vitals filed for this visit.  Visit Diagnosis:  Acute right-sided low back pain without sciatica  Abnormality of gait  Unsteadiness  Pain in knee joint, right  Generalized weakness      Subjective Assessment - 05/01/15 1411    Subjective Pt rated her back pain 8/10 without shooting symptoms. Pt reported that bridging was causing her back pain to become worse when she does them at home.   Limitations Standing;Walking;House  hold activities   Patient Stated Goals "To be able to bend my right knee without pain; to walk without the cane."   Currently in Pain? Yes   Pain Score 8    Pain Location Back   Pain Orientation Right;Lower   Pain Descriptors / Indicators Aching   Pain Type Acute pain   Pain Onset 1 to 4 weeks ago   Pain Frequency Intermittent   Aggravating Factors  getting in and out of bed.   Pain Relieving Factors sitting still   Effect of Pain on Daily Activities "Can't stand up straight"   Multiple Pain Sites No            [facility] PT Assessment - 05/01/15 0001    Berg Balance Test   Sit to Stand Able to stand  independently using hands   Standing Unsupported Able to stand 2 minutes with  supervision   Sitting with Back Unsupported but Feet Supported on Floor or Stool Able to sit safely and securely 2 minutes   Stand to Sit Controls descent by using hands   Transfers Able to transfer safely, minor use of hands   Standing Unsupported with Eyes Closed Able to stand 10 seconds safely   Standing Ubsupported with Feet Together Able to place feet together independently and stand 1 minute safely   From Standing, Reach Forward with Outstretched Arm Can reach forward >12 cm safely (5")   From Standing Position, Pick up Object from Floor Able to pick up shoe safely and easily   From Standing Position, Turn to Look Behind Over each Shoulder Looks behind one side only/other side shows less weight shift   Turn 360 Degrees Able to turn 360 degrees safely but slowly   Standing Unsupported, Alternately Place Feet on Step/Stool Able to stand independently and complete 8 steps >20 seconds   Standing Unsupported, One Foot in Front Able to place foot tandem independently and hold 30 seconds   Standing on One Leg Able to lift leg independently and hold 5-10 seconds   Total Score 47       05/01/15 0001  Ambulation/Gait  Gait velocity 16.16 sec's= 2.34f/sec with cane  Curb 5: Supervision      Therapeutic exercise: To strengthen core for improved balance, spinal stability and pain management. Pt supervision with cues for sequence and form. Isometric TA and pelvic floor contraction in hook lying: 1 set X10, 10 sec holds. Isometric TA and pelvic floor contraction in hook lying with alternating LE marching.  HEP competency was assessed. Pt supervision with cues for sequence and form. Pt limited by pain with bridging.          PT Education - 05/01/15 1453    Education provided Yes   Education Details Pt educated on TA and pelvic floor contraction to stabilize lower paraspinals to reduce the amount of work they are required to do. This education will aid in pain management as well as  progressing to the bridges without pain.   Person(s) Educated Patient   Methods Explanation;Demonstration   Comprehension Verbalized understanding;Returned demonstration         PT Short Term Goals - 05/01/15 1457    PT SHORT TERM GOAL #1   Title Pt will perform initial HEP with mod I using paper handout to maximize functional gains made in PT. Target date: 04/28/15   Baseline on 05/01/15   Status Achieved   PT SHORT TERM GOAL #2   Title Pt will improve self-selected gait speed from  1.85 ft/sec to 2.15 ft/sec to indicate increased efficiency of ambulation. Target date: 04/28/15   Baseline on 05/01/15   Status Not Met   PT SHORT TERM GOAL #3   Title Pt will increase Berg score from 42 to 45/56 to indicate decreased fall risk. Target date: 04/28/15   Baseline on 05/01/15   Status Achieved   PT SHORT TERM GOAL #4   Title Pt will negotiate standard curb step with mod I using LRAD to enable pt to traverse community obstacles, to safely access primary home entrance. Target date: 04/28/15   Baseline on 05/01/15: pt at supervision level due to decreased balance   Status Not Met            PT Long Term Goals - 05/01/15 1458    PT LONG TERM GOAL #1   Title Pt will increase self-selected gait speed from 1.85 ft/sec to 2.45 ft/sec to indicate increased efficiency of ambulation. Target date: 05/26/15   PT LONG TERM GOAL #2   Title Pt will imrpove Berg score from 4 2 to 48/56 to indicate significant improvement in functional standing balance.   Target date: 05/26/15   PT LONG TERM GOAL #3   Title Pt will ambulate x1,000' consecutively over unlevel, paved surfaces wth mod I using LRAD to indicate improved walking tolerance, increase safety with community mobility. Target date: 05/26/15   PT LONG TERM GOAL #4   Title Pt will ambulate x100' over level, indoor surfaces without AD with mod I (for increased time) to progress toward PLOF. Target date: 05/26/15   PT LONG TERM GOAL #5   Title Pt will  perform all above activities with no more than a 2-point increase in within-session pain rating to indicate decreased impact of pain on functional activity tolerance.  Target date: 05/26/15               Plan - 05/01/15 1456    Clinical Impression Statement Pt continues to be limited by pain. She has achieved STG #3 of increasing Berg score to 45>. Pt continues to increase balance and reduce her risk of falling. Pt continues to have generalized weakness due to pain limiting activity tolerance.   Pt will benefit from skilled therapeutic intervention in order to improve on the following deficits Abnormal gait;Decreased range of motion;Decreased activity tolerance;Decreased balance;Decreased mobility;Decreased knowledge of use of DME;Decreased endurance;Decreased strength;Increased edema;Pain;Impaired flexibility   Rehab Potential Good   PT Frequency 2x / week   PT Duration 8 weeks   PT Treatment/Interventions ADLs/Self Care Home Management;Functional mobility training;Gait training;DME Instruction;Stair training;Patient/family education;Neuromuscular re-education;Therapeutic exercise;Therapeutic activities;Vestibular;Manual techniques;Balance training   PT Next Visit Plan Monitor pain, strengthen core and LE's to decrease back pain and increase balance. Try prone extension for pain management and to improve upright posture.    PT Home Exercise Plan 04/07/15: bridge, hip flexor stretch off edge of bed, supine hip abduction/ER with red band. TA and pelvic floor contraction in supine with marching of LE's.   Consulted and Agree with Plan of Care Patient        Problem List Patient Active Problem List   Diagnosis Date Noted  . Lower back pain 04/25/2015  . Risk for falls 03/03/2015  . Healthcare maintenance 04/08/2014  . History of cerebrovascular accident 05/29/2013  . Vitamin D deficiency 05/08/2010  . Osteopenia 04/03/2009  . Bilateral leg edema due to diastolic dysfunction 52/84/1324   . Essential hypertension 09/14/2006  . Osteoarthritis 04/27/2006  . History of PSVT (paroxysmal supraventricular tachycardia)  04/27/2006    Laney Potash 05/01/2015, 3:14 PM  Laney Potash, SPTA  Name: [name] MRN: [id number] Date of Birth: Mar 23, 1941  This note has been reviewed and edited by supervising CI.  Willow Ora, PTA, [facility] [address] [place], [place] [address] [phone] 05/02/2015, 1:46 PM

## 2015-05-05 ENCOUNTER — Ambulatory Visit (INDEPENDENT_AMBULATORY_CARE_PROVIDER_SITE_OTHER): Payer: Medicare Other | Admitting: Student in an Organized Health Care Education/Training Program

## 2015-05-05 ENCOUNTER — Encounter: Payer: Self-pay | Admitting: Student in an Organized Health Care Education/Training Program

## 2015-05-05 VITALS — BP 149/79 | HR 79 | Temp 97.9°F | Ht 67.0 in | Wt 223.6 lb

## 2015-05-05 DIAGNOSIS — M545 Low back pain, unspecified: Secondary | ICD-10-CM

## 2015-05-05 DIAGNOSIS — R609 Edema, unspecified: Secondary | ICD-10-CM

## 2015-05-05 DIAGNOSIS — M810 Age-related osteoporosis without current pathological fracture: Secondary | ICD-10-CM

## 2015-05-05 DIAGNOSIS — Z9181 History of falling: Secondary | ICD-10-CM

## 2015-05-05 DIAGNOSIS — M1711 Unilateral primary osteoarthritis, right knee: Secondary | ICD-10-CM

## 2015-05-05 DIAGNOSIS — I10 Essential (primary) hypertension: Secondary | ICD-10-CM

## 2015-05-05 MED ORDER — ALENDRONATE SODIUM 70 MG PO TABS: 70.0000 mg | ORAL_TABLET | ORAL | Status: DC

## 2015-05-05 NOTE — Assessment & Plan Note (Signed)
BP recheck with large cuff was 132/80 which seems appropriate for her. I do not want to increase antihypertensives because of her fall risk. Continue atenolol at current dosing and lasix. We will continue to hold clonidine.

## 2015-05-05 NOTE — Assessment & Plan Note (Deleted)
Back pain is improved, not resolved, consistent with MSK strain of the paralumbars. I encouraged more supportive therapy with PT and NSAIDs as needed. 

## 2015-05-05 NOTE — Progress Notes (Signed)
   See Encounters tab for problem-based medical decision making  __________________________________________________________  HPI:  74 year old woman presents for follow up of lower extremity edema. Swelling is improved since our last visit. Can now tie her laces which is better. Edema improved after stopping clonidine and digoxin. Lasix helps throughout the day. She elevates at night with benefit. Swelling accumulates throughout the day. No DOE, SOB, or PND. No chest pain. Good exertional capacity. She lives independently and is doing well at home. Good compliance with other medications. Recently in the office for acute visit due to right lower back pain which is improved with supportive care. Pain not totally resolved yet. No weakness. Working with PT weekly about gait training, started using a cane. No recent falls.   __________________________________________________________  Problem List: Patient Active Problem List   Diagnosis Date Noted  . Lower back pain 04/25/2015  . Risk for falls 03/03/2015  . Healthcare maintenance 04/08/2014  . Vitamin D deficiency 05/08/2010  . Osteoporosis 04/03/2009  . Bilateral leg edema due to diastolic dysfunction 02/22/2007  . Essential hypertension 09/14/2006  . Osteoarthritis 04/27/2006    Medications: Reconciled today in Epic __________________________________________________________  Physical Exam:  Vital Signs: Filed Vitals:   05/05/15 0939  BP: 149/79  Pulse: 79  Temp: 97.9 F (36.6 C)  TempSrc: Oral  Height: 5\' 7"  (1.702 m)  Weight: 223 lb 9.6 oz (101.424 kg)  SpO2: 100%    Gen: Well appearing, NAD ENT: OP clear without erythema or exudate.  Neck: No cervical LAD, No thyromegaly or nodules, No JVD. CV: RRR, 2/6 early systolic murmur at RUSB Pulm: Normal effort, CTA throughout, no wheezing Abd: Soft, NT, ND, normal BS.  Ext: Warm, 2+ LE edema bilaterally, normal joints Skin: No atypical appearing moles. No rashes

## 2015-05-05 NOTE — Assessment & Plan Note (Signed)
Dexa scan 09/06/2014 with T score -2.1, FRAX showed 4% hip fracture risk. We talked about the risks and benefits of treating with bisphonates to reduce hip fracture risk in the future. I ordered alendronate 70mg  once weekly with precautions about GI upset. Vitamin D is being supplemented and is at appropriate level to start therapy. We will push for at least 5 years of therapy to see the most benefit.

## 2015-05-05 NOTE — Patient Instructions (Signed)
Alendronate effervescent oral tablets What is this medicine? ALENDRONATE (a LEN droe nate) slows calcium loss from bones. It helps to make healthy bone and to slow bone loss in people with osteoporosis. This medicine may be used for other purposes; ask your health care provider or pharmacist if you have questions. What should I tell my health care provider before I take this medicine? They need to know if you have any of these conditions: -esophagus, stomach, or intestine problems, like acid-reflux or GERD -dental disease -heart disease -high blood pressure -kidney disease -low blood calcium -low vitamin D -problems swallowing -problems sitting or standing for 30 minutes -an unusual or allergic reaction to alendronate, other medicines, foods, dyes, or preservatives -pregnant or trying to get pregnant -breast-feeding How should I use this medicine? You must take this medicine exactly as directed or you will lower the amount of medicine you absorb into your body or you may cause yourself harm. Take your dose by mouth first thing in the morning, after you are up for the day. Do not eat or drink anything before you take this medicine. Dissolve your medicine in a glass (4 fluid ounces) of plain, room temperature water. Do not take this tablet with any other drink. Wait at least 5 minutes after the medicine has dissolved and then stir for 10 seconds and drink. After taking this medicine, do not eat breakfast, drink, or take any medicines or vitamins for at least 30 minutes. Stand or sit up for at least 30 minutes after you take this medicine; do not lie down. Take this medicine on the same day every week. Do not take your medicine more often than directed. Talk to your pediatrician regarding the use of this medicine in children. Special care may be needed. Overdosage: If you think you have taken too much of this medicine contact a poison control center or emergency room at once. NOTE: This medicine  is only for you. Do not share this medicine with others. What if I miss a dose? If you miss a dose, take the dose on the morning after you remember. Then take your next dose on your regular day of the week. Never take 2 tablets on the same day. Do not take double or extra doses. What may interact with this medicine? -aluminum hydroxide -antacids -aspirin -calcium supplements -drugs for inflammation like ibuprofen, naproxen, and others -iron supplements -magnesium supplements -vitamins with minerals This list may not describe all possible interactions. Give your health care provider a list of all the medicines, herbs, non-prescription drugs, or dietary supplements you use. Also tell them if you smoke, drink alcohol, or use illegal drugs. Some items may interact with your medicine. What should I watch for while using this medicine? Visit your doctor or health care professional for regular checks ups. It may be some time before you see benefit from this medicine. Do not stop taking your medication except on your doctor's advice. Your doctor or health care professional may order blood tests and other tests to see how you are doing. You should make sure you get enough calcium and vitamin D while you are taking this medicine, unless your doctor tells you not to. Discuss the foods you eat and the vitamins you take with your health care professional. Some people who take this medicine have severe bone, joint, and/or muscle pain. This medicine may also increase your risk for a broken thigh bone. Tell your doctor right away if you have pain in your upper leg   or groin. Tell your doctor if you have any pain that does not go away or that gets worse. This medicine can make you more sensitive to the sun. If you get a rash while taking this medicine, sunlight may cause the rash to get worse. Keep out of the sun. If you cannot avoid being in the sun, wear protective clothing and use sunscreen. Do not use sun lamps  or tanning beds/booths. What side effects may I notice from receiving this medicine? Side effects that you should report to your doctor or health care professional as soon as possible: -allergic reactions like skin rash, itching or hives, swelling of the face, lips, or tongue -black or tarry stools -bone, muscle or joint pain -changes in vision -chest pain -heartburn or stomach pain -jaw pain, especially after dental work -pain or trouble when swallowing -redness, blistering, peeling or loosening of the skin, including inside the mouth Side effects that usually do not require medical attention (Report these to your doctor or health care professional if they continue or are bothersome.): -changes in taste -diarrhea or constipation -eye pain or itching -headache -nausea or vomiting -stomach gas or fullness This list may not describe all possible side effects. Call your doctor for medical advice about side effects. You may report side effects to FDA at 1-800-FDA-1088. Where should I keep my medicine? Keep out of the reach of children. Store between 20 and 25 degrees C (68 and 77 degrees F). Keep this medicine in the original container. Throw away any unused medicine after the expiration date. NOTE: This sheet is a summary. It may not cover all possible information. If you have questions about this medicine, talk to your doctor, pharmacist, or health care provider.    2016, [employer]vier/Gold Standard. (2011-04-22 09:07:01)  

## 2015-05-05 NOTE — Assessment & Plan Note (Signed)
Lower extremity edema is stable, improved since stopping clonidine and digoxin. Recent echo showed diastolic dysfunction and possibly elevated pulmonary pressures. Patient would like to treat this conservatively for now with diuresis as needed. Continue lasix at current dosing. We can consider a sleep study in the future if she has worsening edema.

## 2015-05-05 NOTE — Assessment & Plan Note (Signed)
Doing well with PT regimen. Uses a cane now on PT recommendations. No sedating medications. Started bisphosphonate for osteoporosis today.

## 2015-05-05 NOTE — Assessment & Plan Note (Signed)
Back pain is improved, not resolved, consistent with MSK strain of the paralumbars. I encouraged more supportive therapy with PT and NSAIDs as needed.

## 2015-05-06 ENCOUNTER — Ambulatory Visit: Payer: Medicare Other | Admitting: Physical Therapy

## 2015-05-08 ENCOUNTER — Ambulatory Visit: Payer: Medicare Other | Admitting: Physical Therapy

## 2015-05-12 ENCOUNTER — Ambulatory Visit: Payer: Medicare Other | Admitting: Physical Therapy

## 2015-05-12 DIAGNOSIS — R269 Unspecified abnormalities of gait and mobility: Secondary | ICD-10-CM | POA: Diagnosis not present

## 2015-05-12 DIAGNOSIS — R2681 Unsteadiness on feet: Secondary | ICD-10-CM

## 2015-05-12 DIAGNOSIS — R531 Weakness: Secondary | ICD-10-CM

## 2015-05-12 NOTE — Patient Instructions (Signed)
PELVIC TILT: Posterior    Tighten abdominals, flatten low back against your bed. Hold for 3 seconds. Relax. Repeat. Perform 10 reps, 2-3 times per day.   Copyright  VHI. All rights reserved.

## 2015-05-12 NOTE — Therapy (Signed)
[facility] [address] [place], [place], [address] Phone: [phone]   Fax:  [phone]  Physical Therapy Treatment  Patient Details  Name: [name] MRN: [id number] Date of Birth: August 01, 1940 No Data Recorded  Encounter Date: 05/12/2015      PT End of Session - 05/12/15 2019    Visit Number 6   Number of Visits 17   Authorization Type [employer] - G Codes every 10 visits   PT Start Time 5110  Pt arrived late to session.   PT Stop Time 1447   PT Time Calculation (min) 41 min   Activity Tolerance Patient tolerated treatment well   Behavior During Therapy WFL for tasks assessed/performed      Past Medical History  Diagnosis Date  . Hypertension   . Hyperlipemia   . DJD (degenerative joint disease)   . Chronic low back pain   . Abnormal electromyogram (EMG)     Low grade right S1 radiculopathy 7/93.  [name] Hot flashes   . Rotator cuff syndrome of right shoulder     S/P right shoulder arthroscopic debridement of a massive rotator cuff tear, greater tuberosity and resection of subacromial spur by Dr. Frederik Pear 07/07/2005.  [name] Herniated lumbar intervertebral disc     S/P L4-5 & L5-S1 laminotomy, foraminotomy, and L5-S1 diskectomy by Dr. Glenna Fellows 11/01/2000.  . Multiple joint pain   . Iron deficiency anemia   . Vitamin D deficiency   . SVT (supraventricular tachycardia) (HCC)     PSVT  . Bilateral leg edema   . Peripheral neuropathy ([place])   . Osteopenia     A DEXA bone density scan done 02/13/2009 showed a left femur neck young adult T-score of -1.6 (osteopenia), a right femur neck young adult T-score of -1.6 (osteopenia), and an AP spine young adult T-score of 1.3 (normal).  Her FRAX score gave an estimated 10 year probability of 6.6% for major osteoporotic fracture and 0.8% for hip fracture.  . Seasonal allergic rhinitis   . Elevated alkaline phosphatase level     Bone scan 08/2004 showed no evidence of  abnormal bony activity..  . Skin tag     Right axillary area.  . Hypokalemia   . Headache(784.0)   . Diastolic dysfunction     a. 08/2012 Echo: EF 60-65%, Gr 2 DD, triv MR, PASP 15mHg.  . Chest pain     a. reports nl cath in the 1990's.  . Stroke ([facility] 04/2013    Past Surgical History  Procedure Laterality Date  . Total abdominal hysterectomy  1992  . Lumbar disc surgery  11/01/2000     L4-5, L5-S1 laminotomy, foraminotomy and L5-S1 diskectomy  . Shoulder arthroscopy  1/172007    S/P right shoulder arthroscopic debridement of a massive rotator cuff tear, greater tuberoplasty and resection of subacromial spur by Dr. FFrederik Pearon 07/07/2005.  .Marland KitchenCholecystectomy, laparoscopic  1998  . Cholecystectomy      There were no vitals filed for this visit.  Visit Diagnosis:  Abnormality of gait  Unsteadiness  Generalized weakness      Subjective Assessment - 05/12/15 1412    Subjective Pt reports no pain at this time. Denies falls.    Pertinent History CVA with residual L-sided weakness, OA, osteopenia   Limitations Standing;Walking;House hold activities   Patient Stated Goals "To be able to bend my right knee without pain; to walk without the cane."   Currently in Pain? No/denies  [place] Adult PT Treatment/Exercise - 05/12/15 0001    Bed Mobility   Bed Mobility Supine to Sit;Sit to Supine   Supine to Sit 5: Supervision   Supine to Sit Details (indicate cue type and reason) Cueing for core muscle activation, use of logroll technique to avoid lumbar spine pain.   Sit to Supine 5: Supervision   Sit to Supine - Details (indicate cue type and reason) Cueing as above for supine > sit.   Transfers   Transfers Sit to Stand;Stand to Sit   Sit to Stand 6: Modified independent (Device/Increase time)   Stand to Sit 6: Modified independent (Device/Increase time)   Ambulation/Gait   Ambulation/Gait Yes   Ambulation/Gait Assistance 6: Modified  independent (Device/Increase time);5: Supervision;4: Min guard   Ambulation/Gait Assistance Details (S) to min guard without AD; Mod I using SPC   Ambulation Distance (Feet) 675 Feet  x500' outdoors   Assistive device Straight cane;None   Gait Pattern Step-through pattern;Decreased stride length;Trunk flexed;Right flexed knee in stance;Left flexed knee in stance  limited B hip extension throughout gait cycle   Ambulation Surface Level;Unlevel;Indoor;Outdoor;Paved   Gait velocity 2.63 ft/sec   Curb 5: Supervision   Curb Details (indicate cue type and reason) x3 trials indoors, x1 trial outdoors with SPC. During initial trial, required cueing for sequencing. Exhibited effective within-session carryover.   Exercises   Exercises Other Exercises   Other Exercises  During bridging (to scoot), provided cueing for core muscle activation concurrently with bridging to decrease strain on lower back muscles (right paraspinals, multifidi, and quadratus lumborum). With explanation and demonstration from PT, pt performed self-stretch of B iliopsoas, rectus femoris muscles in supine, hook lying 2 x60-sec holds per side to improve postural alignment and promote gait normalization, stability. In supine, hook lying, pt performed posterior pelvic tilt 10 reps x3-sec holds; added to HEP.                PT Education - 05/12/15 2017    Education provided Yes   Education Details HEP: added posterior pelvic tilt. Recommended pt continue to perform hip flexor stretch previously provided, as tolerated.   Person(s) Educated Patient   Methods Explanation;Demonstration;Verbal cues;Tactile cues;Handout   Comprehension Verbalized understanding;Returned demonstration          PT Short Term Goals - 05/12/15 1427    PT SHORT TERM GOAL #1   Title Pt will perform initial HEP with mod I using paper handout to maximize functional gains made in PT. Target date: 04/28/15   Baseline on 05/01/15   Status Achieved   PT  SHORT TERM GOAL #2   Title Pt will improve self-selected gait speed from 1.85 ft/sec to 2.15 ft/sec to indicate increased efficiency of ambulation. Target date: 04/28/15   Baseline 11/21: gait velocity = 2.63 ft/sec   Status Achieved   PT SHORT TERM GOAL #3   Title Pt will increase Berg score from 42 to 45/56 to indicate decreased fall risk. Target date: 04/28/15   Baseline on 05/01/15   Status Achieved   PT SHORT TERM GOAL #4   Title Pt will negotiate standard curb step with mod I using LRAD to enable pt to traverse community obstacles, to safely access primary home entrance. Target date: 04/28/15   Baseline on 05/01/15: pt at supervision level due to decreased balance   Status Not Met           PT Long Term Goals - 05/12/15 1428    PT LONG TERM GOAL #1  Title Pt will increase self-selected gait speed from 1.85 ft/sec to 2.45 ft/sec to indicate increased efficiency of ambulation. Target date: 05/26/15   Baseline 11/21: gait velocity = 2.63 ft/sec   Status Achieved   PT LONG TERM GOAL #2   Title Pt will imrpove Berg score from 4 2 to 48/56 to indicate significant improvement in functional standing balance.   Target date: 05/26/15   PT LONG TERM GOAL #3   Title Pt will ambulate x1,000' consecutively over unlevel, paved surfaces wth mod I using LRAD to indicate improved walking tolerance, increase safety with community mobility. Target date: 05/26/15   PT LONG TERM GOAL #4   Title Pt will ambulate x100' over level, indoor surfaces without AD with mod I (for increased time) to progress toward PLOF. Target date: 05/26/15   PT LONG TERM GOAL #5   Title Pt will perform all above activities with no more than a 2-point increase in within-session pain rating to indicate decreased impact of pain on functional activity tolerance.  Target date: 05/26/15               Plan - 05/12/15 1435    Clinical Impression Statement STG and LTG for gait speed met, as pt's gait velocity improved from 1.85  ft/sec to 2.63 ft/sec. Gait stability without AD limited by crouched gait pattern. Therefore, reiterated importance of hip flexor stretching, hip extensor strengthening to improve postural alignment and normalize gait pattern. Added posterior pelvic tilt to HEP.   Pt will benefit from skilled therapeutic intervention in order to improve on the following deficits Abnormal gait;Decreased range of motion;Decreased activity tolerance;Decreased balance;Decreased mobility;Decreased knowledge of use of DME;Decreased endurance;Decreased strength;Increased edema;Pain;Impaired flexibility   Rehab Potential Good   PT Frequency 2x / week   PT Duration 8 weeks   PT Treatment/Interventions ADLs/Self Care Home Management;Functional mobility training;Gait training;DME Instruction;Stair training;Patient/family education;Neuromuscular re-education;Therapeutic exercise;Therapeutic activities;Vestibular;Manual techniques;Balance training   PT Next Visit Plan Consider supine posterior pelvic tilt with reciprocal LE flexion; lumbar multifidi activation.   Consulted and Agree with Plan of Care Patient        Problem List Patient Active Problem List   Diagnosis Date Noted  . Lower back pain 04/25/2015  . Risk for falls 03/03/2015  . Healthcare maintenance 04/08/2014  . Vitamin D deficiency 05/08/2010  . Osteoporosis 04/03/2009  . Bilateral leg edema due to diastolic dysfunction 38/03/1750  . Essential hypertension 09/14/2006  . Osteoarthritis 04/27/2006   Billie Ruddy, PT, DPT [facility] [address] [place], [place], [address] Phone: [phone]   Fax:  [phone] 05/12/2015, 8:29 PM  Name: [name] MRN: [id number] Date of Birth: 11/11/1940

## 2015-05-13 ENCOUNTER — Other Ambulatory Visit: Payer: Self-pay

## 2015-05-13 MED ORDER — ATORVASTATIN CALCIUM 40 MG PO TABS: 40.0000 mg | ORAL_TABLET | Freq: Every day | ORAL | Status: DC

## 2015-05-13 MED ORDER — CLOPIDOGREL BISULFATE 75 MG PO TABS: 75.0000 mg | ORAL_TABLET | Freq: Every day | ORAL | Status: DC

## 2015-05-14 ENCOUNTER — Ambulatory Visit: Payer: Medicare Other | Admitting: Physical Therapy

## 2015-05-20 ENCOUNTER — Ambulatory Visit: Payer: Medicare Other | Admitting: Physical Therapy

## 2015-05-22 ENCOUNTER — Ambulatory Visit
Payer: Medicare Other | Attending: Student in an Organized Health Care Education/Training Program | Admitting: Physical Therapy

## 2015-05-22 DIAGNOSIS — M25561 Pain in right knee: Secondary | ICD-10-CM | POA: Insufficient documentation

## 2015-05-22 DIAGNOSIS — R2681 Unsteadiness on feet: Secondary | ICD-10-CM | POA: Insufficient documentation

## 2015-05-22 DIAGNOSIS — M545 Low back pain: Secondary | ICD-10-CM | POA: Insufficient documentation

## 2015-05-22 DIAGNOSIS — R531 Weakness: Secondary | ICD-10-CM | POA: Insufficient documentation

## 2015-05-22 DIAGNOSIS — R269 Unspecified abnormalities of gait and mobility: Secondary | ICD-10-CM | POA: Insufficient documentation

## 2015-05-27 ENCOUNTER — Ambulatory Visit: Payer: Medicare Other | Admitting: Physical Therapy

## 2015-05-29 ENCOUNTER — Ambulatory Visit: Payer: Medicare Other | Admitting: Physical Therapy

## 2015-05-29 ENCOUNTER — Encounter: Payer: Medicare Other | Admitting: Physical Therapy

## 2015-05-29 DIAGNOSIS — R269 Unspecified abnormalities of gait and mobility: Secondary | ICD-10-CM | POA: Diagnosis present

## 2015-05-29 DIAGNOSIS — R531 Weakness: Secondary | ICD-10-CM | POA: Diagnosis present

## 2015-05-29 DIAGNOSIS — M545 Low back pain: Secondary | ICD-10-CM | POA: Diagnosis present

## 2015-05-29 DIAGNOSIS — M25561 Pain in right knee: Secondary | ICD-10-CM | POA: Diagnosis present

## 2015-05-29 DIAGNOSIS — R2681 Unsteadiness on feet: Secondary | ICD-10-CM | POA: Diagnosis present

## 2015-05-29 NOTE — Therapy (Signed)
[facility] [address] [place], [place], [address] Phone: [phone]   Fax:  [phone]  Patient Details  Name: [name] MRN: [id number] Date of Birth: 1941/02/08 Referring Provider:  No ref. provider found  Encounter Date: 05/29/2015 PHYSICAL THERAPY DISCHARGE SUMMARY  Visits from Start of Care: 6  Current functional level related to goals / functional outcomes: Unknown, as patient was not present for final PT visit.     PT Long Term Goals - 05/12/15 1428    PT LONG TERM GOAL #1   Title Pt will increase self-selected gait speed from 1.85 ft/sec to 2.45 ft/sec to indicate increased efficiency of ambulation. Target date: 05/26/15   Baseline 11/21: gait velocity = 2.63 ft/sec   Status Achieved   PT LONG TERM GOAL #2   Title Pt will imrpove Berg score from 4 2 to 48/56 to indicate significant improvement in functional standing balance.   Target date: 05/26/15   PT LONG TERM GOAL #3   Title Pt will ambulate x1,000' consecutively over unlevel, paved surfaces wth mod I using LRAD to indicate improved walking tolerance, increase safety with community mobility. Target date: 05/26/15   PT LONG TERM GOAL #4   Title Pt will ambulate x100' over level, indoor surfaces without AD with mod I (for increased time) to progress toward PLOF. Target date: 05/26/15   PT LONG TERM GOAL #5   Title Pt will perform all above activities with no more than a 2-point increase in within-session pain rating to indicate decreased impact of pain on functional activity tolerance.  Target date: 05/26/15        Remaining deficits: Unknown, as patient was not present for final scheduled PT visit.   Education / Equipment: HEP. Plan: Patient agrees to discharge.  Patient goals were not met. Patient is being discharged due to not returning since the last visit.  ?????       Billie Ruddy, PT, DPT [facility] [address] [place], [place], [address] Phone: [phone]   Fax:  [phone] 05/29/2015, 7:51 PM

## 2015-06-02 ENCOUNTER — Other Ambulatory Visit: Payer: Self-pay | Admitting: Student in an Organized Health Care Education/Training Program

## 2015-06-11 ENCOUNTER — Other Ambulatory Visit: Payer: Self-pay | Admitting: Internal Medicine

## 2015-07-08 ENCOUNTER — Other Ambulatory Visit: Payer: Self-pay | Admitting: Student in an Organized Health Care Education/Training Program

## 2015-07-24 ENCOUNTER — Other Ambulatory Visit: Payer: Self-pay | Admitting: Student in an Organized Health Care Education/Training Program

## 2015-09-04 ENCOUNTER — Other Ambulatory Visit: Payer: Self-pay | Admitting: Student in an Organized Health Care Education/Training Program

## 2015-09-27 ENCOUNTER — Emergency Department (HOSPITAL_COMMUNITY): Payer: Medicare Other

## 2015-09-27 ENCOUNTER — Encounter (HOSPITAL_COMMUNITY): Payer: Self-pay | Admitting: *Deleted

## 2015-09-27 ENCOUNTER — Inpatient Hospital Stay (HOSPITAL_COMMUNITY)
Admission: EM | Admit: 2015-09-27 | Discharge: 2015-09-29 | DRG: 065 | Disposition: A | Discharge status: Home Health | Payer: Medicare Other | Attending: Internal Medicine | Admitting: Internal Medicine

## 2015-09-27 DIAGNOSIS — Z8672 Personal history of thrombophlebitis: Secondary | ICD-10-CM

## 2015-09-27 DIAGNOSIS — Z79899 Other long term (current) drug therapy: Secondary | ICD-10-CM

## 2015-09-27 DIAGNOSIS — E785 Hyperlipidemia, unspecified: Secondary | ICD-10-CM | POA: Diagnosis present

## 2015-09-27 DIAGNOSIS — Z8673 Personal history of transient ischemic attack (TIA), and cerebral infarction without residual deficits: Secondary | ICD-10-CM

## 2015-09-27 DIAGNOSIS — M8580 Other specified disorders of bone density and structure, unspecified site: Secondary | ICD-10-CM | POA: Diagnosis present

## 2015-09-27 DIAGNOSIS — M25561 Pain in right knee: Secondary | ICD-10-CM | POA: Diagnosis present

## 2015-09-27 DIAGNOSIS — I480 Paroxysmal atrial fibrillation: Secondary | ICD-10-CM | POA: Diagnosis present

## 2015-09-27 DIAGNOSIS — I63411 Cerebral infarction due to embolism of right middle cerebral artery: Principal | ICD-10-CM | POA: Diagnosis present

## 2015-09-27 DIAGNOSIS — Z7902 Long term (current) use of antithrombotics/antiplatelets: Secondary | ICD-10-CM

## 2015-09-27 DIAGNOSIS — D571 Sickle-cell disease without crisis: Secondary | ICD-10-CM

## 2015-09-27 DIAGNOSIS — I10 Essential (primary) hypertension: Secondary | ICD-10-CM | POA: Diagnosis present

## 2015-09-27 DIAGNOSIS — I110 Hypertensive heart disease with heart failure: Secondary | ICD-10-CM | POA: Diagnosis present

## 2015-09-27 DIAGNOSIS — Z90710 Acquired absence of both cervix and uterus: Secondary | ICD-10-CM

## 2015-09-27 DIAGNOSIS — Z882 Allergy status to sulfonamides status: Secondary | ICD-10-CM

## 2015-09-27 DIAGNOSIS — Z880 Allergy status to penicillin: Secondary | ICD-10-CM

## 2015-09-27 DIAGNOSIS — H538 Other visual disturbances: Secondary | ICD-10-CM | POA: Diagnosis present

## 2015-09-27 DIAGNOSIS — I639 Cerebral infarction, unspecified: Secondary | ICD-10-CM

## 2015-09-27 DIAGNOSIS — Z8249 Family history of ischemic heart disease and other diseases of the circulatory system: Secondary | ICD-10-CM

## 2015-09-27 DIAGNOSIS — I272 Other secondary pulmonary hypertension: Secondary | ICD-10-CM | POA: Diagnosis present

## 2015-09-27 DIAGNOSIS — I5032 Chronic diastolic (congestive) heart failure: Secondary | ICD-10-CM | POA: Diagnosis present

## 2015-09-27 DIAGNOSIS — D509 Iron deficiency anemia, unspecified: Secondary | ICD-10-CM | POA: Diagnosis present

## 2015-09-27 DIAGNOSIS — R200 Anesthesia of skin: Secondary | ICD-10-CM | POA: Diagnosis not present

## 2015-09-27 DIAGNOSIS — R609 Edema, unspecified: Secondary | ICD-10-CM

## 2015-09-27 DIAGNOSIS — G629 Polyneuropathy, unspecified: Secondary | ICD-10-CM | POA: Diagnosis present

## 2015-09-27 DIAGNOSIS — Z87891 Personal history of nicotine dependence: Secondary | ICD-10-CM

## 2015-09-27 LAB — DIFFERENTIAL
BASOS ABS: 0 10*3/uL (ref 0.0–0.1)
Basophils Relative: 0 %
Eosinophils Absolute: 0 10*3/uL (ref 0.0–0.7)
Eosinophils Relative: 1 %
LYMPHS ABS: 1.3 10*3/uL (ref 0.7–4.0)
LYMPHS PCT: 27 %
MONOS PCT: 9 %
Monocytes Absolute: 0.4 10*3/uL (ref 0.1–1.0)
NEUTROS PCT: 63 %
Neutro Abs: 3.1 10*3/uL (ref 1.7–7.7)

## 2015-09-27 LAB — I-STAT CHEM 8, ED
BUN: 13 mg/dL (ref 6–20)
CALCIUM ION: 1.26 mmol/L (ref 1.13–1.30)
CHLORIDE: 100 mmol/L — AB (ref 101–111)
CREATININE: 0.5 mg/dL (ref 0.44–1.00)
GLUCOSE: 130 mg/dL — AB (ref 65–99)
HCT: 37 % (ref 36.0–46.0)
Hemoglobin: 12.6 g/dL (ref 12.0–15.0)
POTASSIUM: 3.5 mmol/L (ref 3.5–5.1)
Sodium: 140 mmol/L (ref 135–145)
TCO2: 27 mmol/L (ref 0–100)

## 2015-09-27 LAB — COMPREHENSIVE METABOLIC PANEL
ALBUMIN: 3.6 g/dL (ref 3.5–5.0)
ALK PHOS: 101 U/L (ref 38–126)
ALT: 13 U/L — AB (ref 14–54)
AST: 16 U/L (ref 15–41)
Anion gap: 10 (ref 5–15)
BUN: 12 mg/dL (ref 6–20)
CALCIUM: 9.6 mg/dL (ref 8.9–10.3)
CO2: 24 mmol/L (ref 22–32)
CREATININE: 0.48 mg/dL (ref 0.44–1.00)
Chloride: 101 mmol/L (ref 101–111)
GFR calc Af Amer: >60 mL/min (ref >60)
GLUCOSE: 132 mg/dL — AB (ref 65–99)
Potassium: 3.4 mmol/L — ABNORMAL LOW (ref 3.5–5.1)
Sodium: 135 mmol/L (ref 135–145)
TOTAL PROTEIN: 6.2 g/dL — AB (ref 6.5–8.1)
Total Bilirubin: 1.1 mg/dL (ref 0.3–1.2)

## 2015-09-27 LAB — CBC
HEMATOCRIT: 36.9 % (ref 36.0–46.0)
HEMOGLOBIN: 11.5 g/dL — AB (ref 12.0–15.0)
MCH: 26.6 pg (ref 26.0–34.0)
MCHC: 31.2 g/dL (ref 30.0–36.0)
MCV: 85.4 fL (ref 78.0–100.0)
Platelets: 140 10*3/uL — ABNORMAL LOW (ref 150–400)
RBC: 4.32 MIL/uL (ref 3.87–5.11)
RDW: 13.6 % (ref 11.5–15.5)
WBC: 4.8 10*3/uL (ref 4.0–10.5)

## 2015-09-27 LAB — I-STAT TROPONIN, ED: TROPONIN I, POC: 0 ng/mL (ref 0.00–0.08)

## 2015-09-27 LAB — CBG MONITORING, ED: Glucose-Capillary: 128 mg/dL — ABNORMAL HIGH (ref 65–99)

## 2015-09-27 LAB — APTT: aPTT: 31 seconds (ref 24–37)

## 2015-09-27 LAB — PROTIME-INR
INR: 1.09 (ref 0.00–1.49)
Prothrombin Time: 14.3 seconds (ref 11.6–15.2)

## 2015-09-27 MED ORDER — HYDROCODONE-ACETAMINOPHEN 5-325 MG PO TABS
1.0000 | ORAL_TABLET | Freq: Once | ORAL | Status: AC
  Administered 2015-09-27: 1 via ORAL
  Filled 2015-09-27: qty 1

## 2015-09-27 MED ORDER — ONDANSETRON 4 MG PO TBDP
4.0000 mg | ORAL_TABLET | Freq: Once | ORAL | Status: AC
  Administered 2015-09-27: 4 mg via ORAL
  Filled 2015-09-27: qty 1

## 2015-09-27 NOTE — ED Notes (Signed)
The pt is alert and oriented skin warm and dry.  No facial droop.  Moving all extremities  Grip equal

## 2015-09-27 NOTE — ED Notes (Signed)
Patient transported to CT 

## 2015-09-27 NOTE — ED Notes (Signed)
Neuro checks completed once patient came to the back. None out front.

## 2015-09-27 NOTE — ED Notes (Signed)
The pt is c/o a headache with dizziness since she woke up this am at 0830.  She felt ok last pm at bedtime none during the night.  She is c/o nausea  And since yesterday she has had bi-lateral feet and ankle swelling since yesterday also

## 2015-09-27 NOTE — ED Notes (Signed)
Checked pt CBG, 128 RN Chris informed

## 2015-09-28 ENCOUNTER — Observation Stay (HOSPITAL_BASED_OUTPATIENT_CLINIC_OR_DEPARTMENT_OTHER): Payer: Medicare Other

## 2015-09-28 ENCOUNTER — Encounter (HOSPITAL_COMMUNITY): Payer: Self-pay | Admitting: Emergency Medicine

## 2015-09-28 ENCOUNTER — Observation Stay (HOSPITAL_COMMUNITY): Payer: Medicare Other

## 2015-09-28 DIAGNOSIS — I638 Other cerebral infarction: Secondary | ICD-10-CM | POA: Diagnosis not present

## 2015-09-28 DIAGNOSIS — Z880 Allergy status to penicillin: Secondary | ICD-10-CM | POA: Diagnosis not present

## 2015-09-28 DIAGNOSIS — M25561 Pain in right knee: Secondary | ICD-10-CM | POA: Diagnosis present

## 2015-09-28 DIAGNOSIS — D509 Iron deficiency anemia, unspecified: Secondary | ICD-10-CM | POA: Diagnosis present

## 2015-09-28 DIAGNOSIS — Z882 Allergy status to sulfonamides status: Secondary | ICD-10-CM | POA: Diagnosis not present

## 2015-09-28 DIAGNOSIS — I639 Cerebral infarction, unspecified: Secondary | ICD-10-CM | POA: Diagnosis present

## 2015-09-28 DIAGNOSIS — Z87891 Personal history of nicotine dependence: Secondary | ICD-10-CM | POA: Diagnosis not present

## 2015-09-28 DIAGNOSIS — Z90710 Acquired absence of both cervix and uterus: Secondary | ICD-10-CM | POA: Diagnosis not present

## 2015-09-28 DIAGNOSIS — I272 Other secondary pulmonary hypertension: Secondary | ICD-10-CM | POA: Diagnosis present

## 2015-09-28 DIAGNOSIS — I110 Hypertensive heart disease with heart failure: Secondary | ICD-10-CM | POA: Diagnosis present

## 2015-09-28 DIAGNOSIS — G629 Polyneuropathy, unspecified: Secondary | ICD-10-CM | POA: Diagnosis present

## 2015-09-28 DIAGNOSIS — E785 Hyperlipidemia, unspecified: Secondary | ICD-10-CM | POA: Diagnosis present

## 2015-09-28 DIAGNOSIS — Z8249 Family history of ischemic heart disease and other diseases of the circulatory system: Secondary | ICD-10-CM | POA: Diagnosis not present

## 2015-09-28 DIAGNOSIS — R200 Anesthesia of skin: Secondary | ICD-10-CM | POA: Diagnosis present

## 2015-09-28 DIAGNOSIS — I499 Cardiac arrhythmia, unspecified: Secondary | ICD-10-CM | POA: Diagnosis not present

## 2015-09-28 DIAGNOSIS — I6789 Other cerebrovascular disease: Secondary | ICD-10-CM

## 2015-09-28 DIAGNOSIS — R609 Edema, unspecified: Secondary | ICD-10-CM | POA: Diagnosis not present

## 2015-09-28 DIAGNOSIS — Z8672 Personal history of thrombophlebitis: Secondary | ICD-10-CM | POA: Diagnosis not present

## 2015-09-28 DIAGNOSIS — I5032 Chronic diastolic (congestive) heart failure: Secondary | ICD-10-CM | POA: Diagnosis present

## 2015-09-28 DIAGNOSIS — Z7902 Long term (current) use of antithrombotics/antiplatelets: Secondary | ICD-10-CM | POA: Diagnosis not present

## 2015-09-28 DIAGNOSIS — I63511 Cerebral infarction due to unspecified occlusion or stenosis of right middle cerebral artery: Secondary | ICD-10-CM | POA: Diagnosis not present

## 2015-09-28 DIAGNOSIS — I10 Essential (primary) hypertension: Secondary | ICD-10-CM | POA: Diagnosis not present

## 2015-09-28 DIAGNOSIS — Z79899 Other long term (current) drug therapy: Secondary | ICD-10-CM | POA: Diagnosis not present

## 2015-09-28 DIAGNOSIS — H538 Other visual disturbances: Secondary | ICD-10-CM | POA: Diagnosis present

## 2015-09-28 DIAGNOSIS — M8580 Other specified disorders of bone density and structure, unspecified site: Secondary | ICD-10-CM | POA: Diagnosis present

## 2015-09-28 DIAGNOSIS — I480 Paroxysmal atrial fibrillation: Secondary | ICD-10-CM | POA: Diagnosis present

## 2015-09-28 DIAGNOSIS — I63411 Cerebral infarction due to embolism of right middle cerebral artery: Secondary | ICD-10-CM | POA: Diagnosis present

## 2015-09-28 DIAGNOSIS — Z8673 Personal history of transient ischemic attack (TIA), and cerebral infarction without residual deficits: Secondary | ICD-10-CM | POA: Diagnosis not present

## 2015-09-28 LAB — BASIC METABOLIC PANEL
Anion gap: 8 (ref 5–15)
BUN: 7 mg/dL (ref 6–20)
CHLORIDE: 105 mmol/L (ref 101–111)
CO2: 27 mmol/L (ref 22–32)
CREATININE: 0.49 mg/dL (ref 0.44–1.00)
Calcium: 9.5 mg/dL (ref 8.9–10.3)
GFR calc Af Amer: >60 mL/min (ref >60)
GFR calc non Af Amer: >60 mL/min (ref >60)
GLUCOSE: 104 mg/dL — AB (ref 65–99)
Potassium: 3.5 mmol/L (ref 3.5–5.1)
SODIUM: 140 mmol/L (ref 135–145)

## 2015-09-28 LAB — LIPID PANEL
CHOL/HDL RATIO: 3.2 ratio
CHOLESTEROL: 166 mg/dL (ref 0–200)
HDL: 52 mg/dL (ref >40)
LDL Cholesterol: 104 mg/dL — ABNORMAL HIGH (ref 0–99)
Triglycerides: 50 mg/dL (ref <150)
VLDL: 10 mg/dL (ref 0–40)

## 2015-09-28 LAB — HIV ANTIBODY (ROUTINE TESTING W REFLEX): HIV SCREEN 4TH GENERATION: NONREACTIVE

## 2015-09-28 LAB — MAGNESIUM: MAGNESIUM: 1.8 mg/dL (ref 1.7–2.4)

## 2015-09-28 LAB — VITAMIN B12: Vitamin B-12: 1149 pg/mL — ABNORMAL HIGH (ref 180–914)

## 2015-09-28 MED ORDER — ACETAMINOPHEN 325 MG PO TABS
650.0000 mg | ORAL_TABLET | ORAL | Status: DC | PRN
  Administered 2015-09-29: 650 mg via ORAL
  Filled 2015-09-28: qty 2

## 2015-09-28 MED ORDER — STROKE: EARLY STAGES OF RECOVERY BOOK
Freq: Once | Status: DC
  Filled 2015-09-28: qty 1

## 2015-09-28 MED ORDER — VITAMIN B-12 100 MCG PO TABS: 500.0000 ug | ORAL_TABLET | Freq: Every day | ORAL | Status: DC

## 2015-09-28 MED ORDER — CALCIUM-VITAMIN D3 600-400 MG-UNIT PO TABS: 1.0000 | ORAL_TABLET | Freq: Two times a day (BID) | ORAL | Status: DC

## 2015-09-28 MED ORDER — ONDANSETRON HCL 4 MG/2ML IJ SOLN: 4.0000 mg | Freq: Four times a day (QID) | INTRAMUSCULAR | Status: DC | PRN

## 2015-09-28 MED ORDER — ATORVASTATIN CALCIUM 40 MG PO TABS: 40.0000 mg | ORAL_TABLET | Freq: Every day | ORAL | Status: DC

## 2015-09-28 MED ORDER — POTASSIUM CHLORIDE CRYS ER 20 MEQ PO TBCR
40.0000 meq | EXTENDED_RELEASE_TABLET | Freq: Once | ORAL | Status: AC
  Administered 2015-09-28: 40 meq via ORAL
  Filled 2015-09-28: qty 2

## 2015-09-28 MED ORDER — GADOBENATE DIMEGLUMINE 529 MG/ML IV SOLN
20.0000 mL | Freq: Once | INTRAVENOUS | Status: AC | PRN
  Administered 2015-09-28: 20 mL via INTRAVENOUS

## 2015-09-28 MED ORDER — ACETAMINOPHEN 650 MG RE SUPP: 650.0000 mg | RECTAL | Status: DC | PRN

## 2015-09-28 MED ORDER — ATORVASTATIN CALCIUM 80 MG PO TABS
80.0000 mg | ORAL_TABLET | Freq: Every day | ORAL | Status: DC
  Administered 2015-09-28: 80 mg via ORAL
  Filled 2015-09-28: qty 1

## 2015-09-28 MED ORDER — VITAMIN B-12 500 MCG SL SUBL: 1.0000 | SUBLINGUAL_TABLET | Freq: Every day | SUBLINGUAL | Status: DC

## 2015-09-28 MED ORDER — ENOXAPARIN SODIUM 40 MG/0.4ML ~~LOC~~ SOLN
40.0000 mg | SUBCUTANEOUS | Status: DC
  Administered 2015-09-28 – 2015-09-29 (×2): 40 mg via SUBCUTANEOUS
  Filled 2015-09-28 (×2): qty 0.4

## 2015-09-28 MED ORDER — CALCIUM CARBONATE-VITAMIN D 500-200 MG-UNIT PO TABS
1.0000 | ORAL_TABLET | Freq: Two times a day (BID) | ORAL | Status: DC
  Administered 2015-09-28 – 2015-09-29 (×3): 1 via ORAL
  Filled 2015-09-28 (×3): qty 1

## 2015-09-28 MED ORDER — CLOPIDOGREL BISULFATE 75 MG PO TABS
75.0000 mg | ORAL_TABLET | Freq: Every day | ORAL | Status: DC
  Administered 2015-09-28 – 2015-09-29 (×2): 75 mg via ORAL
  Filled 2015-09-28 (×2): qty 1

## 2015-09-28 MED ORDER — SENNOSIDES-DOCUSATE SODIUM 8.6-50 MG PO TABS
1.0000 | ORAL_TABLET | Freq: Every evening | ORAL | Status: DC | PRN
  Administered 2015-09-28: 1 via ORAL
  Filled 2015-09-28 (×2): qty 1

## 2015-09-28 NOTE — Consult Note (Signed)
Reason: Stroke  HPI:  75 yo AAF developed acute onset left perioral and left hand numbness a week ago.  CT brain was negative, but MRI Brain shows several small right parietal ischemic infarcts on DWI.  ECG is NSR, but she admits to an episode of atrial fibrillation 6 years ago found after she developed shortness of breath.  She has never been on anticoagulation.  She does have a history of HTN and hyperlipidemia.  CBC, CMP, Coag negative.  She was on Plavix when this happened and she is adamant about full compliance.    Meds at home: Lipitor, Plavix, Lasix, Atenolol, KCL, Vitamins   Exam: 161/92 P 68 T98  Neuro exam is normal except for subjective tingling in the left face and hand which has improved over the last week.  A/P: Acute right parietal subcortical infarcts.  There is a possibility that these infarcts are cardioembolic in nature.  However, atheroembolism from carotid plaque or intracranial MCA plaque is possible.  Cont Telemetry for surveillance of A. Fib.  If positive, then start Xarelto 20 mg qd.  If negative here, consider 30 day holter monitoring as outpatient.  If due to intracranial or extracranial vessel disease, then this constitutes Plavix failure.  Consideration should be given to switching to Aggrenox 25/200 mg bid.  In the mean time, we should assess intracranial and extracranial vasculature with MRA Brain and Neck.  TTE as well and TEE if felt needed.

## 2015-09-28 NOTE — Progress Notes (Signed)
  Echocardiogram 2D Echocardiogram has been performed.  [name], [name] 09/28/2015, 9:11 AM

## 2015-09-28 NOTE — Progress Notes (Signed)
75 year old AA Female [facility] with c/o  Numbness and tingling of left side of face hand and leg which has resolved over time except numbness to the LLE and fingers  Alert  Oriented x 4,  vs stable no c/ over the night.

## 2015-09-28 NOTE — Progress Notes (Signed)
VASCULAR LAB PRELIMINARY  PRELIMINARY  PRELIMINARY  PRELIMINARY  Bilateral lower extremity venous duplex completed.    Preliminary report:  There is no DVT or SVT noted in the bilateral lower extremities.   Cristian Davitt, RVT 09/28/2015, 10:02 AM

## 2015-09-28 NOTE — Progress Notes (Addendum)
STROKE TEAM PROGRESS NOTE   HISTORY OF PRESENT ILLNESS 75 yo AAF developed acute onset left perioral and left hand numbness a week ago. CT brain was negative, but MRI Brain shows several small right parietal ischemic infarcts on DWI. ECG is NSR, but she admits to an episode of atrial fibrillation 6 years ago found after she developed shortness of breath. She has never been on anticoagulation. She does have a history of HTN and hyperlipidemia. CBC, CMP, Coag negative. She was on Plavix when this happened and she is adamant about full compliance.    SUBJECTIVE (INTERVAL HISTORY) Son an daughter at bedside, her symptoms have resolved.   OBJECTIVE Temp:  [97.5 F (36.4 C)-98.6 F (37 C)] 98.6 F (37 C) (04/09 0609) Pulse Rate:  [65-87] 71 (04/09 0609) Cardiac Rhythm:  [-] Heart block (04/09 0400) Resp:  [15-24] 18 (04/09 0609) BP: (128-189)/(67-100) 132/67 mmHg (04/09 0609) SpO2:  [92 %-100 %] 100 % (04/09 0609) Weight:  [99.746 kg (219 lb 14.4 oz)] 99.746 kg (219 lb 14.4 oz) (04/09 0200)  CBC:   Recent Labs Lab 09/27/15 1746 09/27/15 1757  WBC 4.8  --   NEUTROABS 3.1  --   HGB 11.5* 12.6  HCT 36.9 37.0  MCV 85.4  --   PLT 140*  --     Basic Metabolic Panel:   Recent Labs Lab 09/27/15 1746 09/27/15 1757 09/28/15 0515  NA 135 140 140  K 3.4* 3.5 3.5  CL 101 100* 105  CO2 24  --  27  GLUCOSE 132* 130* 104*  BUN CREATININE 0.48 0.50 0.49  CALCIUM 9.6  --  9.5  MG  --   --  1.8    Lipid Panel:     Component Value Date/Time   CHOL 166 09/28/2015 0515   TRIG 50 09/28/2015 0515   HDL 52 09/28/2015 0515   CHOLHDL 3.2 09/28/2015 0515   VLDL 10 09/28/2015 0515   LDLCALC 104* 09/28/2015 0515   HgbA1c:  Lab Results  Component Value Date   HGBA1C 5.4 08/22/2014   Urine Drug Screen:     Component Value Date/Time   LABOPIA NONE DETECTED 05/29/2013 1159   COCAINSCRNUR NONE DETECTED 05/29/2013 1159   LABBENZ NONE DETECTED 05/29/2013 1159    AMPHETMU NONE DETECTED 05/29/2013 1159   THCU NONE DETECTED 05/29/2013 1159   LABBARB NONE DETECTED 05/29/2013 1159      IMAGING  Ct Head Wo Contrast 09/27/2015   1. No acute intracranial abnormality.  2. Stable mild to moderate chronic microvascular ischemic changes of the white matter diffusely, including the pons and brainstem.   Mr Brain Wo Contrast 09/27/2015   Acute subcentimeter RIGHT parietal lobe infarct compatible with small vessel ischemic disease. Chronic changes including moderate to severe chronic small vessel ischemic disease and old bilateral basal ganglia and thalamus lacunar infarcts.    Mr Maxine Glenn Head/brain Wo Cm 09/28/2015   Negative MRA head.  Complete circle of Willis.    PHYSICAL EXAM  PHYSICAL EXAM Physical exam: Exam: Gen: NAD Eyes: anicteric sclerae, moist conjunctivae                    CV: no MRG, no carotid bruits, no peripheral edema Mental Status: Alert, follows commands, good historian  Neuro: Detailed Neurologic Exam  Speech:    No aphasia, no dysarthria  Cranial Nerves:    The pupils are equal, round, and reactive to light.. Facial sensation symmetric. Attempted, Fundi not visualized.  EOMI. Midline conjugate gaze. . Visual fields full. Face symmetric, Tongue midline. Hearing intact to voice. Shoulder shrug intact  Motor Observation:    no involuntary movements noted. Tone appears normal.     Strength:   Intact, mild weakness of the right leg proximally moreso due to knee pain     Sensation:  Intact to LT  Gait:  Able to stand from seating independently and walk to bed with assistance.   Coordination:  No dysmetria  Plantars downgoing.   ASSESSMENT/PLAN [name] is a 75 y.o. female with history of paroxysmal atrial fibrillation without anticoagulation, iron deficient anemia, diastolic dysfunction, previous stroke, and peripheral neuropathy presenting with acute onset of left perioral and left hand numbness approximately  one week prior to admission. She did not receive IV t-PA due to late presentation.  Stroke:  Non-dominant Infarct - small vessel disease  Resultant  resolved  MRI - acute subcentimeter RIGHT parietal lobe infarct compatible with small vessel ischemic disease.  MRA - Negative MRA head.  Carotid Doppler - 09/17/2014 - no significant stenosis  Bilateral lower extremity venous duplex - negative  2D Echo - pending  LDL - 104  HgbA1c - pending   VTE prophylaxis - Lovenox Diet Heart Room service appropriate?: Yes; Fluid consistency:: Thin  clopidogrel 75 mg daily prior to admission, now on clopidogrel 75 mg daily  Patient counseled to be compliant with her antithrombotic medications  Ongoing aggressive stroke risk factor management  Therapy recommendations: - Pending  Disposition: Pending  Hypertension  Stable  Permissive hypertension (OK if < 220/120) but gradually normalize in 5-7 days  Hyperlipidemia  Home meds:  Lipitor 40 mg daily resumed in hospital  LDL 104, goal < 70  Now on Lipitor 80 mg daily  Continue statin at discharge   Other Stroke Risk Factors  Advanced age  Cigarette smoker, quit smoking 16 years ago  Obesity, Body mass index is 34.43 kg/(m^2).   Hx stroke/TIA - old strokes by MRI  Previous episode of atrial fibrillation    Personally examined patient and images, and have participated in and made any corrections needed to history, physical, neuro exam,assessment and plan as stated above.  I have personally obtained the history, evaluated lab date, reviewed imaging studies and agree with radiology interpretations.    Naomie DeanAntonia Ahern, MD Stroke Neurology [phone] [facility]       To contact Stroke Continuity provider, please refer to [email or web address]. After hours, contact General Neurology

## 2015-09-28 NOTE — Progress Notes (Signed)
Subjective: No acute overnight events. Patient denies having any focal weakness at present. States she is still experiencing intermittent numbness on the left side of her face and left arm. No other complaints.   Objective: Vital signs in last 24 hours: Filed Vitals:   09/28/15 0030 09/28/15 0200 09/28/15 0400 09/28/15 0609  BP: 142/79 153/76 149/69 132/67  Pulse: 84  76 71  Temp:  98.6 F (37 C) 97.5 F (36.4 C) 98.6 F (37 C)  TempSrc:  Oral Oral Oral  Resp: 15 18 16 18   Height:  5\' 7"  (1.702 m)    Weight:  219 lb 14.4 oz (99.746 kg)    SpO2: 100% 100% 100% 100%   Weight change:  No intake or output data in the 24 hours ending 09/28/15 1041  Physical Exam: Constitutional: Sitting on bedside chair eating breakfast. Family (son and daughter) present in the room.   Eyes: EOM are normal.   Cardiovascular: Normal rate and regular rhythm.No murmur, rub, or gallop appreciated.  Pulmonary/Chest: Effort normal. No respiratory distress. She has no wheezes. She has no rales.  Abdominal: Soft. Bowel sounds are normal. She exhibits no distension. There is no tenderness.  Musculoskeletal: Normal range of motion. She exhibits edema and tenderness.  Pitting edema b/l lower extremities, +3 at feet up to distal shin, +1 to just below the knees Neurological: She is alert and oriented to person, place, and time. She has normal strength. She displays no tremor. No cranial nerve deficit or sensory deficit. She exhibits normal muscle tone. Coordination normal.  Sensation to touch equal and intact bilateral face, arms, and legs. Finger to nose intact b/l.  Skin: She is not diaphoretic.   Lab Results: Basic Metabolic Panel:  Recent Labs Lab 09/27/15 1746 09/27/15 1757 09/28/15 0515  NA 135 140 140  K 3.4* 3.5 3.5  CL 101 100* 105  CO2 24  --  27  GLUCOSE 132* 130* 104*  BUN 12 13 7   CREATININE 0.48 0.50 0.49  CALCIUM 9.6  --  9.5  MG  --   --  1.8   Liver Function Tests:  Recent  Labs Lab 09/27/15 1746  AST 16  ALT 13*  ALKPHOS 101  BILITOT 1.1  PROT 6.2*  ALBUMIN 3.6   CBC:  Recent Labs Lab 09/27/15 1746 09/27/15 1757  WBC 4.8  --   NEUTROABS 3.1  --   HGB 11.5* 12.6  HCT 36.9 37.0  MCV 85.4  --   PLT 140*  --    CBG:  Recent Labs Lab 09/27/15 1745  GLUCAP 128*   Fasting Lipid Panel:  Recent Labs Lab 09/28/15 0515  CHOL 166  HDL 52  LDLCALC 104*  TRIG 50  CHOLHDL 3.2   Coagulation:  Recent Labs Lab 09/27/15 1746  LABPROT 14.3  INR 1.09   Anemia Panel:  Recent Labs Lab 09/28/15 0515  VITAMINB12 1149*   Urine Drug Screen: Drugs of Abuse     Component Value Date/Time   LABOPIA NONE DETECTED 05/29/2013 1159   COCAINSCRNUR NONE DETECTED 05/29/2013 1159   LABBENZ NONE DETECTED 05/29/2013 1159   AMPHETMU NONE DETECTED 05/29/2013 1159   THCU NONE DETECTED 05/29/2013 1159   LABBARB NONE DETECTED 05/29/2013 1159    Micro Results: No results found for this or any previous visit (from the past 240 hour(s)). Studies/Results: Ct Head Wo Contrast  09/27/2015  CLINICAL DATA:  [age] year old presenting with acute onset of headache, dizziness and blurred vision that began this  morning. EXAM: CT HEAD WITHOUT CONTRAST TECHNIQUE: Contiguous axial images were obtained from the base of the skull through the vertex without intravenous contrast. COMPARISON:  CT head 05/29/2013, 05/08/2013. MRI brain 05/29/2013, 05/08/2013. FINDINGS: Ventricular system normal in size and appearance for age. No significant atrophy for age. Mild to moderate changes of small vessel disease of the white matter diffusely, including the pons and brainstem, unchanged. No mass lesion. No midline shift. No acute hemorrhage or hematoma. No extra-axial fluid collections. No evidence of acute infarction. No skull fracture or other focal osseous abnormality involving the skull. Visualized paranasal sinuses, bilateral mastoid air cells and bilateral middle ear cavities  well-aerated. Minimal bilateral carotid siphon and vertebral artery atherosclerosis. IMPRESSION: 1. No acute intracranial abnormality. 2. Stable mild to moderate chronic microvascular ischemic changes of the white matter diffusely, including the pons and brainstem. Electronically Signed   By: Hulan Saas M.D.   On: 09/27/2015 18:18   Mr Brain Wo Contrast  09/27/2015  CLINICAL DATA:  Headache and dizziness since waking up this morning. Nausea. Extremity swelling. History of hypertension, hyperlipidemia. EXAM: MRI HEAD WITHOUT CONTRAST TECHNIQUE: Multiplanar, multiecho pulse sequences of the brain and surrounding structures were obtained without intravenous contrast. COMPARISON:  CT head September 27, 2015 at 1813 hours and MRI head May 29, 2013 FINDINGS: Subcentimeter focus of reduced diffusion RIGHT parietal lobe, low ADC values. No susceptibility artifact to suggest hemorrhage. No susceptibility artifact to suggest hemorrhage. Ventricles and sulci are normal for patient's age. Patchy to confluent supratentorial and pontine white matter FLAIR T2 hyperintensities without midline shift, mass effect masses. Old bilateral basal ganglia and thalamus lacunar infarcts. No abnormal extra-axial fluid collections. No extra-axial masses though, contrast enhanced sequences would be more sensitive. Normal major intracranial vascular flow voids seen at the skull base. Ocular globes and orbital contents are unremarkable though not tailored for evaluation. Mildly expanded partial empty sella. No suspicious calvarial bone marrow signal. Craniocervical junction maintained. Visualized paranasal sinuses and mastoid air cells are well-aerated. Patient is edentulous. IMPRESSION: Acute subcentimeter RIGHT parietal lobe infarct compatible with small vessel ischemic disease. Chronic changes including moderate to severe chronic small vessel ischemic disease and old bilateral basal ganglia and thalamus lacunar infarcts. Electronically  Signed   By: Awilda Metro M.D.   On: 09/27/2015 23:27   Mr Maxine Glenn Head/brain Wo Cm  09/28/2015  CLINICAL DATA:  Dizziness, headache and LEFT facial numbness. History of hypertension hyperlipidemia. EXAM: MRA HEAD WITHOUT CONTRAST TECHNIQUE: Angiographic images of the Circle of Willis were obtained using MRA technique without intravenous contrast. COMPARISON:  MRI of the brain September 27, 2015 FINDINGS: Anterior circulation: Normal flow related enhancement of the included cervical, petrous, cavernous and supraclinoid internal carotid arteries. Patent anterior communicating artery. Normal flow related enhancement of the anterior and middle cerebral arteries, including distal segments. No large vessel occlusion, high-grade stenosis, abnormal luminal irregularity, aneurysm. Posterior circulation: Codominant vertebral arteries. Basilar artery is patent, with normal flow related enhancement of the main branch vessels. Normal flow related enhancement of the posterior cerebral arteries. Robust contribution from bilateral posterior communicating arteries. Probable slow flow LEFT P4 segment. No large vessel occlusion, high-grade stenosis, abnormal luminal irregularity, aneurysm. IMPRESSION: Negative MRA head.  Complete circle of Willis. Electronically Signed   By: Awilda Metro M.D.   On: 09/28/2015 02:26   Medications: I have reviewed the patient's current medications. Scheduled Meds: .  stroke: mapping our early stages of recovery book   Does not apply Once  . atorvastatin  80  mg Oral q1800  . calcium-vitamin D  1 tablet Oral BID  . clopidogrel  75 mg Oral Daily  . enoxaparin (LOVENOX) injection  40 mg Subcutaneous Q24H   Continuous Infusions:  PRN Meds:.acetaminophen **OR** acetaminophen, ondansetron (ZOFRAN) IV, senna-docusate Assessment/Plan: Principal Problem:   Acute CVA (cerebrovascular accident) (HCC) Active Problems:   Essential hypertension   Chronic diastolic (congestive) heart failure  (HCC)  75 year old woman with PMH of CVA on Plavix (thalamic and basal ganglia infarcts 2014), Paroxysmal SVT (previosly on Digoxin), and Osteoarthritis who presents with left hand and left face numbness, found to have an acute right parietal lobe infarct.  Acute Right Parietal Lobe Infarct: Patient without apparent neurological deficit on exam other than some continued subjective left face and arm numbness. Patient with prior CVAs thought due to small vessel ischemic disease, which may be cause of her current stroke. This would be a failure of Plavix therapy and may warrant switch to another agent such as Aggrenox. She does have a history of paroxysmal SVT/ ? Afib which is another possibility for cardioembolic stroke. Prior TTE did show changes suggesting possible PFO which can allow an embolism/DVT to pass to intracranial vasculature. Bubble study done today 09/28/15 did not mention a PFO. LE dopplers negative for DVT. MRA of head is not showing any acute abnormality. MRA of neck is showing mild proximal tortuosity of the L common carotid artery and L vertebral artery without significant stenosis. No significant carotid bifurcation disease. Lipid panel showing slightly elevated LDL 104 but is otherwise normal.  -Neurology following, appreciate assistance -Continue to monitor on telemetry for surveillance of A-fib  -Permissive HTN <220/120 -Atorvastatin 80 mg daily -Plavix 75 mg daily -> consider change in therapy to Aggrenox 25/200 mg BID if determined to be Plavix failure -PT/OT -Hgb A1C pending  -Neuro checks  Diastolic CHF: Echo done today 09/25/80 showing LVEF 60-65%, grade 2 diastolic dysfunction, and trivial mitral regurgitation. Patient with increased bilateral lower extremity edema over the last week likely related to her CHF. She takes Lasix 60 mg total daily (20 mg tabs, 2 am, 1 pm) with daily potassium supplement. -Hold Lasix for permissive HTN -Monitor BMP  HTN: Allow permissive HTN  <220/120 -Hold home Atenolol 50 mg daily -Hold home Lasix  Diet: Heart healthy   DVT ppx: Lovenox  Code: FULL  Dispo: Disposition is deferred at this time, awaiting improvement of current medical problems.  Anticipated discharge in approximately 1-2 day(s).   The patient does have a current PCP Para March Orma Flaming, MD) and does need an [facility] hospital follow-up appointment after discharge.  The patient does not have transportation limitations that hinder transportation to clinic appointments.  .Services Needed at time of discharge: Y = Yes, Blank = No PT:   OT:   RN:   Equipment:   Other:     LOS: 0 days   John Giovanni, MD 09/28/2015, 10:41 AM

## 2015-09-28 NOTE — Evaluation (Signed)
Physical Therapy Evaluation Patient Details Name: [name] MRN: [id number] DOB: [birth year] Today's Date: 09/28/2015   History of Present Illness  Admitted with Acute right parietal subcortical infarcts. There is a possibility that these infarcts are cardioembolic in nature.  has a past medical history of Hypertension; Hyperlipemia; DJD (degenerative joint disease); Chronic low back pain; Abnormal electromyogram (EMG); Rotator cuff syndrome of right shoulder; Herniated lumbar intervertebral disc; Multiple joint pain; SVT (supraventricular tachycardia) (HCC); Bilateral leg edema; Peripheral neuropathy (HCC); Osteopenia; Diastolic dysfunction; Chest pain; Stroke [facility](HCC) (04/2013); and Dysrhythmia.  Clinical Impression   Pt admitted with above diagnosis. Pt currently with functional limitations due to the deficits listed below (see PT Problem List).  Pt will benefit from skilled PT to increase their independence and safety with mobility to allow discharge to the venue listed below.       Follow Up Recommendations Home health PT;Supervision - Intermittent    Equipment Recommendations  Rolling walker with 5" wheels;3in1 (PT)    Recommendations for Other Services OT consult     Precautions / Restrictions Precautions Precautions: Fall Precaution Comments: Fall risk greatly reduced with use of RW Restrictions Weight Bearing Restrictions: No      Mobility  Bed Mobility Overal bed mobility: Needs Assistance Bed Mobility: Supine to Sit     Supine to sit: Supervision     General bed mobility comments: Slow moving, able to get to EOB without physical assist; Supervision for safety  Transfers Overall transfer level: Needs assistance Equipment used: Straight cane Transfers: Sit to/from Stand Sit to Stand: Min guard         General transfer comment: Minguard for safety  Ambulation/Gait Ambulation/Gait assistance: Min guard Ambulation Distance (Feet): 100 Feet Assistive  device: Rolling walker (2 wheeled) Gait Pattern/deviations: Step-through pattern;Decreased stride length   Gait velocity interpretation: Below normal speed for age/gender General Gait Details: We considered initiating amb with cane, however with incr knee pain we opted for teh RW, and [name] seemed to be pleased with the support  Information systems managertairs            Wheelchair Mobility    Modified Rankin (Stroke Patients Only) Modified Rankin (Stroke Patients Only) Pre-Morbid Rankin Score: Slight disability Modified Rankin: Moderate disability     Balance Overall balance assessment: Needs assistance Sitting-balance support: Feet supported Sitting balance-Leahy Scale: Good       Standing balance-Leahy Scale: Fair Standing balance comment: While she is able to stand without RW, she seems to very much benefit from UE support                             Pertinent Vitals/Pain Pain Assessment: 0-10 Pain Score: 4  Pain Location: R knee; attributes this to arthritis Pain Descriptors / Indicators: Aching (Stiffness) Pain Intervention(s): Monitored during session    Home Living Family/patient expects to be discharged to:: Private residence Living Arrangements: Alone Available Help at Discharge: Family;Available PRN/intermittently Type of Home: Apartment Home Access: Stairs to enter Entrance Stairs-Rails: None Entrance Stairs-Number of Steps: 1 Home Layout: One level Home Equipment: Cane - single point      Prior Function Level of Independence: Independent         Comments: daughter helps her with IADLs     Hand Dominance        Extremity/Trunk Assessment   Upper Extremity Assessment: Defer to OT evaluation           Lower Extremity Assessment: Generalized  weakness (Noted also decr knee ROM, limited by arthritis pain)         Communication   Communication: No difficulties  Cognition Arousal/Alertness: Awake/alert Behavior During Therapy: WFL for  tasks assessed/performed Overall Cognitive Status: Within Functional Limits for tasks assessed                      General Comments  We discussed signs and symptoms of stroke as well as modifiable risk factors.     Exercises        Assessment/Plan    PT Assessment Patient needs continued PT services  PT Diagnosis Difficulty walking;Acute pain   PT Problem List Decreased strength;Decreased range of motion;Decreased activity tolerance;Decreased balance;Decreased mobility;Decreased knowledge of use of DME;Decreased safety awareness;Decreased knowledge of precautions;Pain  PT Treatment Interventions DME instruction;Gait training;Stair training;Functional mobility training;Therapeutic activities;Therapeutic exercise;Patient/family education   PT Goals (Current goals can be found in the Care Plan section) Acute Rehab PT Goals Patient Stated Goal: Hopes to get home soon PT Goal Formulation: With patient Time For Goal Achievement: 10/05/15 Potential to Achieve Goals: Good    Frequency Min 4X/week   Barriers to discharge        Co-evaluation               End of Session Equipment Utilized During Treatment: Gait belt Activity Tolerance: Patient tolerated treatment well Patient left: in chair;with call bell/phone within reach;with family/visitor present Nurse Communication: Mobility status    Functional Assessment Tool Used: Clinical Judgement Functional Limitation: Mobility: Walking and moving around Mobility: Walking and Moving Around Current Status [phone]): At least 1 percent but less than 20 percent impaired, limited or restricted Mobility: Walking and Moving Around Goal Status [phone]): 0 percent impaired, limited or restricted    Time: 1436-1501 PT Time Calculation (min) (ACUTE ONLY): 25 min   Charges:   PT Evaluation $PT Eval Moderate Complexity: 1 Procedure PT Treatments $Gait Training: 8-22 mins   PT G Codes:   PT G-Codes **NOT FOR INPATIENT  CLASS** Functional Assessment Tool Used: Clinical Judgement Functional Limitation: Mobility: Walking and moving around Mobility: Walking and Moving Around Current Status ([id number]): At least 1 percent but less than 20 percent impaired, limited or restricted Mobility: Walking and Moving Around Goal Status [phone]): 0 percent impaired, limited or restricted    Van Clines [facility] 09/28/2015, 3:30 PM  Van Clines, PT  Acute Rehabilitation Services Pager [phone] Office [phone]

## 2015-09-28 NOTE — ED Notes (Signed)
Attempted report 

## 2015-09-28 NOTE — ED Provider Notes (Signed)
CSN: [id number]     Arrival date & time 09/27/15  1723 History   First MD Initiated Contact with Patient 09/27/15 2050     Chief Complaint  Patient presents with  . Dizziness     (Consider location/radiation/quality/duration/timing/severity/associated sxs/prior Treatment) Patient is a 75 y.o. female presenting with dizziness. The history is provided by the patient (The patient states she has numbness to left side her face and left hand started yesterday and she complains of a headache dizziness today).  Dizziness Quality:  Lightheadedness Severity:  Mild Onset quality:  Sudden Timing:  Intermittent Progression:  Waxing and waning Chronicity:  New Context: not when bending over   Associated symptoms: no chest pain, no diarrhea and no headaches     Past Medical History  Diagnosis Date  . Hypertension   . Hyperlipemia   . DJD (degenerative joint disease)   . Chronic low back pain   . Abnormal electromyogram (EMG)     Low grade right S1 radiculopathy 7/93.  [name] Hot flashes   . Rotator cuff syndrome of right shoulder     S/P right shoulder arthroscopic debridement of a massive rotator cuff tear, greater tuberosity and resection of subacromial spur by Dr. Gean Birchwood 07/07/2005.  [name] Herniated lumbar intervertebral disc     S/P L4-5 & L5-S1 laminotomy, foraminotomy, and L5-S1 diskectomy by Dr. Trey Sailors 11/01/2000.  . Multiple joint pain   . Iron deficiency anemia   . Vitamin D deficiency   . SVT (supraventricular tachycardia) (HCC)     PSVT  . Bilateral leg edema   . Peripheral neuropathy (HCC)   . Osteopenia     A DEXA bone density scan done 02/13/2009 showed a left femur neck young adult T-score of -1.6 (osteopenia), a right femur neck young adult T-score of -1.6 (osteopenia), and an AP spine young adult T-score of 1.3 (normal).  Her FRAX score gave an estimated 10 year probability of 6.6% for major osteoporotic fracture and 0.8% for hip fracture.  . Seasonal allergic rhinitis   .  Elevated alkaline phosphatase level     Bone scan 08/2004 showed no evidence of abnormal bony activity..  . Skin tag     Right axillary area.  . Hypokalemia   . Headache(784.0)   . Diastolic dysfunction     a. 08/2012 Echo: EF 60-65%, Gr 2 DD, triv MR, PASP .  . Chest pain     a. reports nl cath in the 1990's.  . Stroke [facility]) 04/2013   Past Surgical History  Procedure Laterality Date  . Total abdominal hysterectomy  1992  . Lumbar disc surgery  11/01/2000     L4-5, L5-S1 laminotomy, foraminotomy and L5-S1 diskectomy  . Shoulder arthroscopy  1/172007    S/P right shoulder arthroscopic debridement of a massive rotator cuff tear, greater tuberoplasty and resection of subacromial spur by Dr. Gean Birchwood on 07/07/2005.  [name] Cholecystectomy, laparoscopic  1998  . Cholecystectomy     Family History  Problem Relation Age of Onset  . Ovarian cancer Mother 56  . Hypertension Mother   . Heart attack Mother 106  . Hypertension Sister   . Hypertension Sister   . Breast cancer Neg Hx   . Colon cancer Neg Hx   . Lung cancer Neg Hx   . Cancer Brother   . Neuropathy Sister    Social History  Substance Use Topics  . Smoking status: Former Smoker -- 0.15 packs/day for 2 years    Quit date:  10/07/1999  . Smokeless tobacco: Never Used  . Alcohol Use: No   OB History    No data available     Review of Systems  Constitutional: Negative for appetite change and fatigue.  HENT: Negative for congestion, ear discharge and sinus pressure.   Eyes: Negative for discharge.  Respiratory: Negative for cough.   Cardiovascular: Negative for chest pain.  Gastrointestinal: Negative for abdominal pain and diarrhea.  Genitourinary: Negative for frequency and hematuria.  Musculoskeletal: Negative for back pain.  Skin: Negative for rash.  Neurological: Positive for dizziness. Negative for seizures and headaches.       Numbness to face and left hand  Psychiatric/Behavioral: Negative for hallucinations.       Allergies  Sulfonamide derivatives and Penicillins  Home Medications   Prior to Admission medications   Medication Sig Start Date End Date Taking? Authorizing Provider  acetaminophen (TYLENOL) 500 MG tablet Take 1,000 mg by mouth every 6 (six) hours as needed for moderate pain.   Yes Historical Provider, MD  atenolol (TENORMIN) 50 MG tablet TAKE 1 TABLET (50 MG TOTAL) BY MOUTH DAILY. 09/07/15  Yes Tyson Alias, MD  atorvastatin (LIPITOR) 40 MG tablet Take 1 tablet (40 mg total) by mouth daily at 6 PM. 05/13/15  Yes Tyson Alias, MD  Calcium Carb-Cholecalciferol (CALCIUM-VITAMIN D3) 600-400 MG-UNIT TABS Take 1 tablet by mouth 2 (two) times daily. 08/07/14  Yes Margarito Liner, MD  clopidogrel (PLAVIX) 75 MG tablet Take 1 tablet (75 mg total) by mouth daily. 05/13/15  Yes Tyson Alias, MD  [employer] VITAMIN D 2000 units CAPS TAKE 1 CAPSULE EVERY DAY 07/25/15  Yes Tyson Alias, MD  Cyanocobalamin (VITAMIN B-12) 500 MCG SUBL Place 1 tablet under the tongue daily.    Yes Historical Provider, MD  furosemide (LASIX) 20 MG tablet Take 3 tablets (60 mg total) by mouth daily. Take 2 tablets in the morning and 1 tablet at night 11/26/14  Yes Stark Bray, MD  KLOR-CON M20 20 MEQ tablet TAKE 1 TABLET (20 MEQ TOTAL) BY MOUTH DAILY. 07/08/15  Yes Tyson Alias, MD  alendronate (FOSAMAX) 70 MG tablet Take 1 tablet (70 mg total) by mouth every 7 (seven) days. Take with a full glass of water on an empty stomach. 05/05/15   Tyson Alias, MD  Capsaicin-Menthol-Methyl Sal (CAPSAICIN-METHYL SAL-MENTHOL) 0.025-1-12 % CREA Apply 1 Film topically 2 (two) times daily as needed. 11/07/14   Stark Bray, MD  clopidogrel (PLAVIX) 75 MG tablet TAKE 1 TABLET (75 MG TOTAL) BY MOUTH DAILY. 06/11/15   Tyson Alias, MD   BP 161/92 mmHg  Pulse 68  Temp(Src) 98.4 F (36.9 C) (Oral)  Resp 16  SpO2 98% Physical Exam  Constitutional: She is oriented to person, place,  and time. She appears well-developed.  HENT:  Head: Normocephalic.  Eyes: Conjunctivae and EOM are normal. No scleral icterus.  Neck: Neck supple. No thyromegaly present.  Cardiovascular: Normal rate and regular rhythm.  Exam reveals no gallop and no friction rub.   No murmur heard. Pulmonary/Chest: No stridor. She has no wheezes. She has no rales. She exhibits no tenderness.  Abdominal: She exhibits no distension. There is no tenderness. There is no rebound.  Musculoskeletal: Normal range of motion. She exhibits no edema.  Lymphadenopathy:    She has no cervical adenopathy.  Neurological: She is oriented to person, place, and time. She exhibits normal muscle tone. Coordination normal.  Minimal numbness to left hand and left  face  Skin: No rash noted. No erythema.  Psychiatric: She has a normal mood and affect. Her behavior is normal.    ED Course  Procedures (including critical care time) Labs Review Labs Reviewed  CBC - Abnormal; Notable for the following:    Hemoglobin 11.5 (*)    Platelets 140 (*)    All other components within normal limits  COMPREHENSIVE METABOLIC PANEL - Abnormal; Notable for the following:    Potassium 3.4 (*)    Glucose, Bld 132 (*)    Total Protein 6.2 (*)    ALT 13 (*)    All other components within normal limits  CBG MONITORING, ED - Abnormal; Notable for the following:    Glucose-Capillary 128 (*)    All other components within normal limits  I-STAT CHEM 8, ED - Abnormal; Notable for the following:    Chloride 100 (*)    Glucose, Bld 130 (*)    All other components within normal limits  PROTIME-INR  APTT  DIFFERENTIAL  I-STAT TROPOININ, ED    Imaging Review Ct Head Wo Contrast  09/27/2015  CLINICAL DATA:  75 year old presenting with acute onset of headache, dizziness and blurred vision that began this morning. EXAM: CT HEAD WITHOUT CONTRAST TECHNIQUE: Contiguous axial images were obtained from the base of the skull through the vertex without  intravenous contrast. COMPARISON:  CT head 05/29/2013, 05/08/2013. MRI brain 05/29/2013, 05/08/2013. FINDINGS: Ventricular system normal in size and appearance for age. No significant atrophy for age. Mild to moderate changes of small vessel disease of the white matter diffusely, including the pons and brainstem, unchanged. No mass lesion. No midline shift. No acute hemorrhage or hematoma. No extra-axial fluid collections. No evidence of acute infarction. No skull fracture or other focal osseous abnormality involving the skull. Visualized paranasal sinuses, bilateral mastoid air cells and bilateral middle ear cavities well-aerated. Minimal bilateral carotid siphon and vertebral artery atherosclerosis. IMPRESSION: 1. No acute intracranial abnormality. 2. Stable mild to moderate chronic microvascular ischemic changes of the white matter diffusely, including the pons and brainstem. Electronically Signed   By: Hulan Saashomas  Lawrence M.D.   On: 09/27/2015 18:18   Mr Brain Wo Contrast  09/27/2015  CLINICAL DATA:  Headache and dizziness since waking up this morning. Nausea. Extremity swelling. History of hypertension, hyperlipidemia. EXAM: MRI HEAD WITHOUT CONTRAST TECHNIQUE: Multiplanar, multiecho pulse sequences of the brain and surrounding structures were obtained without intravenous contrast. COMPARISON:  CT head September 27, 2015 at 1813 hours and MRI head May 29, 2013 FINDINGS: Subcentimeter focus of reduced diffusion RIGHT parietal lobe, low ADC values. No susceptibility artifact to suggest hemorrhage. No susceptibility artifact to suggest hemorrhage. Ventricles and sulci are normal for patient's age. Patchy to confluent supratentorial and pontine white matter FLAIR T2 hyperintensities without midline shift, mass effect masses. Old bilateral basal ganglia and thalamus lacunar infarcts. No abnormal extra-axial fluid collections. No extra-axial masses though, contrast enhanced sequences would be more sensitive. Normal  major intracranial vascular flow voids seen at the skull base. Ocular globes and orbital contents are unremarkable though not tailored for evaluation. Mildly expanded partial empty sella. No suspicious calvarial bone marrow signal. Craniocervical junction maintained. Visualized paranasal sinuses and mastoid air cells are well-aerated. Patient is edentulous. IMPRESSION: Acute subcentimeter RIGHT parietal lobe infarct compatible with small vessel ischemic disease. Chronic changes including moderate to severe chronic small vessel ischemic disease and old bilateral basal ganglia and thalamus lacunar infarcts. Electronically Signed   By: Awilda Metroourtnay  Bloomer M.D.   On: 09/27/2015  23:27   I have personally reviewed and evaluated these images and lab results as part of my medical decision-making.   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 Patient will be admitted    Bethann Berkshire, MD 09/28/15 0006

## 2015-09-28 NOTE — H&P (Signed)
Date: 09/28/2015               Patient Name:  [name] MRN: [id number]  DOB: 04/05/1941 Age / Sex: 75 y.o., female   PCP: Tyson Aliasuncan Thomas Vincent, MD         Medical Service: Internal Medicine Teaching Service         Attending Physician: Dr. Inez CatalinaEmily B Mullen, MD    First Contact: Dr. Reggie PileVasu Rathore Pager: [phone]  Second Contact: Dr. Rich Numberarly Rivet Pager: [phone]       After Hours (After 5p/  First Contact Pager: [phone]  weekends / holidays): Second Contact Pager: [phone]   Chief Complaint: Left hand numbness  History of Present Illness: [name] is a 75 year old woman with PMH of CVA on Plavix (thalamic and basal ganglia infarcts 2014), Grade 2 diastolic CHF (EF 57-8460-65 %, mod pulm HTN, possible PFO seen on TTE 03/2015), Paroxysmal SVT (previosly on Digoxin), and Osteoarthritis who presents with left hand and left face numbness. Patient was at her usual state of health until yesterday morning when she noticed numbness of her left hand and left face while at rest. She stood up and had dizziness and blurred vision. She had associated nausea, headache, fatigue, and shortness of breath. She denied any facial droop, slurred speech, confusion, focal weakness, vomiting, chest pain, palpitations, diaphoresis, or seizure-like activity. She says she walks with a cane due to right knee pain from OA. She says her gait is unchanged and she has not fallen or lost consciousness. She has noticed increased swelling of both her legs for the last week which improves with elevation and worsens when standing. She reports a history of phlebitis, but is unsure of any prior blood clots. When seen in the ED she feels that she is at her baseline, except for some continued numbness in her left hand, which is improving.  She had prior CVAs in November and December 2014 with similar presentation. Her first episode was considered a possible TIA as imaging did not show acute infarct. Her second episode was considered  a failure of aspirin and she was started on Plavix at that time for secondary prevention. Both episodes were considered secondary to small vessel ischemic disease. Patient states she has been taking her Plavix daily.  She is a former smoker, quit 30 years ago. She denies any alcohol or illicit drug use. She reports a history of ovarian cancer and HTN in her mother.  In the ED, her vitals were: BP 161/92 mmHg  Pulse 68  Temp(Src) 98.4 F (36.9 C) (Oral)  Resp 16  SpO2 98%  CT head showed mild-mod chronic microvascular ischemic changes diffusely, but no acute changes. MRI Brain showed an acute subcentimeter right parietal lobe infarct.   Meds: Current Facility-Administered Medications  Medication Dose Route Frequency Provider Last Rate Last Dose  .  stroke: mapping our early stages of recovery book   Does not apply Once Otis BraceMarjan Rabbani, MD      . acetaminophen (TYLENOL) tablet 650 mg  650 mg Oral Q4H PRN Otis BraceMarjan Rabbani, MD       Or  . acetaminophen (TYLENOL) suppository 650 mg  650 mg Rectal Q4H PRN Marjan Rabbani, MD      . atorvastatin (LIPITOR) tablet 40 mg  40 mg Oral q1800 Marjan Rabbani, MD      . calcium-vitamin D (OSCAL WITH D) 500-200 MG-UNIT per tablet 1 tablet  1 tablet Oral BID Otis BraceMarjan Rabbani, MD  1 tablet at 09/28/15 0350  . clopidogrel (PLAVIX) tablet 75 mg  75 mg Oral Daily Marjan Rabbani, MD   75 mg at 09/28/15 0350  . enoxaparin (LOVENOX) injection 40 mg  40 mg Subcutaneous Q24H Marjan Rabbani, MD      . ondansetron (ZOFRAN) injection 4 mg  4 mg Intravenous Q6H PRN Marjan Rabbani, MD      . senna-docusate (Senokot-S) tablet 1 tablet  1 tablet Oral QHS PRN Marjan Rabbani, MD      . vitamin B-12 (CYANOCOBALAMIN) tablet 500 mcg  500 mcg Oral Daily Otis Brace, MD        Allergies: Allergies as of 09/27/2015 - Review Complete 09/27/2015  Allergen Reaction Noted  . Sulfonamide derivatives Shortness Of Breath   . Penicillins Swelling and Other (See Comments)    Past  Medical History  Diagnosis Date  . Hypertension   . Hyperlipemia   . DJD (degenerative joint disease)   . Chronic low back pain   . Abnormal electromyogram (EMG)     Low grade right S1 radiculopathy 7/93.  [name] Hot flashes   . Rotator cuff syndrome of right shoulder     S/P right shoulder arthroscopic debridement of a massive rotator cuff tear, greater tuberosity and resection of subacromial spur by Dr. Gean Birchwood 07/07/2005.  [name] Herniated lumbar intervertebral disc     S/P L4-5 & L5-S1 laminotomy, foraminotomy, and L5-S1 diskectomy by Dr. Trey Sailors 11/01/2000.  . Multiple joint pain   . Iron deficiency anemia   . Vitamin D deficiency   . SVT (supraventricular tachycardia) (HCC)     PSVT  . Bilateral leg edema   . Peripheral neuropathy (HCC)   . Osteopenia     A DEXA bone density scan done 02/13/2009 showed a left femur neck young adult T-score of -1.6 (osteopenia), a right femur neck young adult T-score of -1.6 (osteopenia), and an AP spine young adult T-score of 1.3 (normal).  Her FRAX score gave an estimated 10 year probability of 6.6% for major osteoporotic fracture and 0.8% for hip fracture.  . Seasonal allergic rhinitis   . Elevated alkaline phosphatase level     Bone scan 08/2004 showed no evidence of abnormal bony activity..  . Skin tag     Right axillary area.  . Hypokalemia   . Headache(784.0)   . Diastolic dysfunction     a. 08/2012 Echo: EF 60-65%, Gr 2 DD, triv MR, PASP .  . Chest pain     a. reports nl cath in the 1990's.  . Stroke (HCC) 04/2013  . Dysrhythmia    Past Surgical History  Procedure Laterality Date  . Total abdominal hysterectomy  1992  . Lumbar disc surgery  11/01/2000     L4-5, L5-S1 laminotomy, foraminotomy and L5-S1 diskectomy  . Shoulder arthroscopy  1/172007    S/P right shoulder arthroscopic debridement of a massive rotator cuff tear, greater tuberoplasty and resection of subacromial spur by Dr. Gean Birchwood on 07/07/2005.  [name] Cholecystectomy,  laparoscopic  1998  . Cholecystectomy     Family History  Problem Relation Age of Onset  . Ovarian cancer Mother 67  . Hypertension Mother   . Heart attack Mother 37  . Hypertension Sister   . Hypertension Sister   . Breast cancer Neg Hx   . Colon cancer Neg Hx   . Lung cancer Neg Hx   . Cancer Brother   . Neuropathy Sister    Social History   Social History  .  Marital Status: Single    Spouse Name: N/A  . Number of Children: 6  . Years of Education: 121th   Occupational History  . retired    Social History Main Topics  . Smoking status: Former Smoker -- 0.15 packs/day for 2 years    Quit date: 10/07/1999  . Smokeless tobacco: Never Used  . Alcohol Use: No  . Drug Use: No  . Sexual Activity: Not on file   Other Topics Concern  . Not on file   Social History Narrative   Patient is single and lives at home alone.    Disabled.   Education high school.   Right handed.   Caffeine sometimes one cup of coffee.    Review of Systems: Review of Systems  Constitutional: Positive for malaise/fatigue. Negative for fever and chills.  Eyes: Positive for blurred vision.  Respiratory: Positive for shortness of breath. Negative for cough, hemoptysis, sputum production and wheezing.   Cardiovascular: Positive for leg swelling. Negative for chest pain, palpitations and orthopnea.  Gastrointestinal: Positive for nausea. Negative for vomiting, abdominal pain, diarrhea, constipation, blood in stool and melena.  Genitourinary: Negative for dysuria and hematuria.  Musculoskeletal: Positive for joint pain. Negative for falls.       Right knee pain  Neurological: Positive for dizziness, sensory change and headaches. Negative for tingling, speech change, focal weakness, seizures and loss of consciousness.       Numbness of left hand and left perioral area  Psychiatric/Behavioral: Negative for substance abuse.     Physical Exam: Blood pressure 149/69, pulse 76, temperature 97.5 F  (36.4 C), temperature source Oral, resp. rate 16, height  (1.702 m), weight 219 lb 14.4 oz (99.746 kg), SpO2 100 %. Physical Exam  Constitutional: She is oriented to person, place, and time. She appears well-developed and well-nourished. No distress.  HENT:  Head: Normocephalic and atraumatic.  Mouth/Throat: Oropharynx is clear and moist.  Eyes: EOM are normal. Pupils are equal, round, and reactive to light.  Neck: Normal range of motion. Neck supple. Carotid bruit is not present.  Cardiovascular: Normal rate and regular rhythm.   Distant heart sounds, ?systolic murmur RUS border  Pulmonary/Chest: Effort normal. No respiratory distress. She has no wheezes. She has no rales.  Abdominal: Soft. Bowel sounds are normal. She exhibits no distension. There is no tenderness.  Musculoskeletal: Normal range of motion. She exhibits edema and tenderness.  pitting edema b/l lower extremities, +3 at feet up to distal shin, +1 to just below the knees, mild tenderness to palpation  Neurological: She is alert and oriented to person, place, and time. She has normal strength. She displays no tremor. No cranial nerve deficit or sensory deficit. She exhibits normal muscle tone. Coordination normal.  Reflex Scores:      Bicep reflexes are 2+ on the right side and 2+ on the left side. Sensation to touch equal and intact bilateral face, arms, and legs. Finger to nose and rapid finger tap intact b/l. Finger grip strength intact b/l. No dysdiadochokinesis. Heel to shin intact on left, limited on right due to right knee pain. Patellar reflexes difficult to elicit. Gait not assessed.  Skin: She is not diaphoretic.     Lab results: Basic Metabolic Panel:  Recent Labs  04/54/09 1746 09/27/15 1757  NA 135 140  K 3.4* 3.5  CL 101 100*  CO2 24  --   GLUCOSE 132* 130*  BUN 12 13  CREATININE 0.48 0.50  CALCIUM 9.6  --  Liver Function Tests:  Recent Labs  09/27/15 1746  AST 16  ALT 13*  ALKPHOS 101    BILITOT 1.1  PROT 6.2*  ALBUMIN 3.6   No results for input(s): LIPASE, AMYLASE in the last 72 hours. No results for input(s): AMMONIA in the last 72 hours. CBC:  Recent Labs  09/27/15 1746 09/27/15 1757  WBC 4.8  --   NEUTROABS 3.1  --   HGB 11.5* 12.6  HCT 36.9 37.0  MCV 85.4  --   PLT 140*  --    Cardiac Enzymes: No results for input(s): CKTOTAL, CKMB, CKMBINDEX, TROPONINI in the last 72 hours. BNP: No results for input(s): PROBNP in the last 72 hours. D-Dimer: No results for input(s): DDIMER in the last 72 hours. CBG:  Recent Labs  09/27/15 1745  GLUCAP 128*   Hemoglobin A1C: No results for input(s): HGBA1C in the last 72 hours. Fasting Lipid Panel: No results for input(s): CHOL, HDL, LDLCALC, TRIG, CHOLHDL, LDLDIRECT in the last 72 hours. Thyroid Function Tests: No results for input(s): TSH, T4TOTAL, FREET4, T3FREE, THYROIDAB in the last 72 hours. Anemia Panel: No results for input(s): VITAMINB12, FOLATE, FERRITIN, TIBC, IRON, RETICCTPCT in the last 72 hours. Coagulation:  Recent Labs  09/27/15 1746  LABPROT 14.3  INR 1.09   Urine Drug Screen: Drugs of Abuse     Component Value Date/Time   LABOPIA NONE DETECTED 05/29/2013 1159   COCAINSCRNUR NONE DETECTED 05/29/2013 1159   LABBENZ NONE DETECTED 05/29/2013 1159   AMPHETMU NONE DETECTED 05/29/2013 1159   THCU NONE DETECTED 05/29/2013 1159   LABBARB NONE DETECTED 05/29/2013 1159    Alcohol Level: No results for input(s): ETH in the last 72 hours. Urinalysis: No results for input(s): COLORURINE, LABSPEC, PHURINE, GLUCOSEU, HGBUR, BILIRUBINUR, KETONESUR, PROTEINUR, UROBILINOGEN, NITRITE, LEUKOCYTESUR in the last 72 hours.  Invalid input(s): APPERANCEUR   Imaging results:  Ct Head Wo Contrast  09/27/2015  CLINICAL DATA:  75 year old presenting with acute onset of headache, dizziness and blurred vision that began this morning. EXAM: CT HEAD WITHOUT CONTRAST TECHNIQUE: Contiguous axial images were  obtained from the base of the skull through the vertex without intravenous contrast. COMPARISON:  CT head 05/29/2013, 05/08/2013. MRI brain 05/29/2013, 05/08/2013. FINDINGS: Ventricular system normal in size and appearance for age. No significant atrophy for age. Mild to moderate changes of small vessel disease of the white matter diffusely, including the pons and brainstem, unchanged. No mass lesion. No midline shift. No acute hemorrhage or hematoma. No extra-axial fluid collections. No evidence of acute infarction. No skull fracture or other focal osseous abnormality involving the skull. Visualized paranasal sinuses, bilateral mastoid air cells and bilateral middle ear cavities well-aerated. Minimal bilateral carotid siphon and vertebral artery atherosclerosis. IMPRESSION: 1. No acute intracranial abnormality. 2. Stable mild to moderate chronic microvascular ischemic changes of the white matter diffusely, including the pons and brainstem. Electronically Signed   By: Hulan Saas M.D.   On: 09/27/2015 18:18   Mr Brain Wo Contrast  09/27/2015  CLINICAL DATA:  Headache and dizziness since waking up this morning. Nausea. Extremity swelling. History of hypertension, hyperlipidemia. EXAM: MRI HEAD WITHOUT CONTRAST TECHNIQUE: Multiplanar, multiecho pulse sequences of the brain and surrounding structures were obtained without intravenous contrast. COMPARISON:  CT head September 27, 2015 at 1813 hours and MRI head May 29, 2013 FINDINGS: Subcentimeter focus of reduced diffusion RIGHT parietal lobe, low ADC values. No susceptibility artifact to suggest hemorrhage. No susceptibility artifact to suggest hemorrhage. Ventricles and sulci are normal  for patient's age. Patchy to confluent supratentorial and pontine white matter FLAIR T2 hyperintensities without midline shift, mass effect masses. Old bilateral basal ganglia and thalamus lacunar infarcts. No abnormal extra-axial fluid collections. No extra-axial masses though,  contrast enhanced sequences would be more sensitive. Normal major intracranial vascular flow voids seen at the skull base. Ocular globes and orbital contents are unremarkable though not tailored for evaluation. Mildly expanded partial empty sella. No suspicious calvarial bone marrow signal. Craniocervical junction maintained. Visualized paranasal sinuses and mastoid air cells are well-aerated. Patient is edentulous. IMPRESSION: Acute subcentimeter RIGHT parietal lobe infarct compatible with small vessel ischemic disease. Chronic changes including moderate to severe chronic small vessel ischemic disease and old bilateral basal ganglia and thalamus lacunar infarcts. Electronically Signed   By: Awilda Metro M.D.   On: 09/27/2015 23:27   Mr Maxine Glenn Head/brain Wo Cm  09/28/2015  CLINICAL DATA:  Dizziness, headache and LEFT facial numbness. History of hypertension hyperlipidemia. EXAM: MRA HEAD WITHOUT CONTRAST TECHNIQUE: Angiographic images of the Circle of Willis were obtained using MRA technique without intravenous contrast. COMPARISON:  MRI of the brain September 27, 2015 FINDINGS: Anterior circulation: Normal flow related enhancement of the included cervical, petrous, cavernous and supraclinoid internal carotid arteries. Patent anterior communicating artery. Normal flow related enhancement of the anterior and middle cerebral arteries, including distal segments. No large vessel occlusion, high-grade stenosis, abnormal luminal irregularity, aneurysm. Posterior circulation: Codominant vertebral arteries. Basilar artery is patent, with normal flow related enhancement of the main branch vessels. Normal flow related enhancement of the posterior cerebral arteries. Robust contribution from bilateral posterior communicating arteries. Probable slow flow LEFT P4 segment. No large vessel occlusion, high-grade stenosis, abnormal luminal irregularity, aneurysm. IMPRESSION: Negative MRA head.  Complete circle of Willis.  Electronically Signed   By: Awilda Metro M.D.   On: 09/28/2015 02:26    Other results: EKG: Sinus rhythm with PACs, t-wave inversions lead III, normal R-wave progression  Assessment & Plan by Problem: Principal Problem:   Acute CVA (cerebrovascular accident) (HCC) Active Problems:   Essential hypertension   Chronic diastolic (congestive) heart failure (HCC)  75 year old woman with PMH of CVA on Plavix (thalamic and basal ganglia infarcts 2014), Grade 2 diastolic CHF (EF 16-10 %, mod pulm HTN, possible PFO seen on TTE 03/2015), Paroxysmal SVT (previosly on Digoxin), and Osteoarthritis who presents with left hand and left face numbness, found to have an acute right parietal lobe infarct.  Acute Right Parietal Lobe Infarct: Patient without apparent neurological deficit on exam other than some continued subjective left hand numbness. Patient with prior CVAs thought due to small vessel ischemic disease, which may be cause of her current stroke. This would be a failure of Plavix therapy and may warrant switch to another agent such as Aggrenox. She does have a history of paroxysmal SVT/ ? Afib which is another possibility for cardioembolic stroke. Prior TTE did show changes suggesting possible PFO which can allow an embolism/DVT to pass to intracranial vasculature. She does have bilateral lower extremity edema, but this appears more likely related to her CHF as there is no erythema, calf pain, or other obvious changes to suggest DVT. Neurology consulted and following. -Appreciate Neurology assistance -MRA Head/neck -Echo bubble study -Lower extremity dopplers -Permissive HTN <220/120 -Atorvastatin 40 mg daily -Plavix 75 mg daily -> consider change in therapy if determined to be Plavix failure -PT/OT -Hgb A1C, Lipid Panel -Telemetry -Neuro checks  Diastolic CHF: EF 96-04 %, mod pulm HTN, possible PFO seen on  TTE 03/2015. Patient with increased bilateral lower extremity edema over the last  week likely related to her CHF. She takes Lasix 60 mg total daily (20 mg tabs, 2 am, 1 pm) with daily potassium supplement. -Hold Lasix for permissive HTN -Replace potassium 40 mEq once -Monitor BMP  HTN: Allow permissive HTN <220/120 -Hold home Atenolol 50 mg daily -Hold home Lasix  Diet: Heart  DVT ppx: Lovenox  Code: FULL   Dispo: Disposition is deferred at this time, awaiting improvement of current medical problems. Anticipated discharge in approximately 1-2 day(s).   The patient does have a current PCP Para March Orma Flaming, MD) and does need an [facility] hospital follow-up appointment after discharge.  The patient does not have transportation limitations that hinder transportation to clinic appointments.  Signed: Darreld Mclean, MD 09/28/2015, 4:19 AM

## 2015-09-29 DIAGNOSIS — I10 Essential (primary) hypertension: Secondary | ICD-10-CM

## 2015-09-29 DIAGNOSIS — I639 Cerebral infarction, unspecified: Secondary | ICD-10-CM

## 2015-09-29 DIAGNOSIS — I63511 Cerebral infarction due to unspecified occlusion or stenosis of right middle cerebral artery: Secondary | ICD-10-CM

## 2015-09-29 DIAGNOSIS — I499 Cardiac arrhythmia, unspecified: Secondary | ICD-10-CM

## 2015-09-29 LAB — HEMOGLOBIN A1C
Hgb A1c MFr Bld: 5.3 % (ref 4.8–5.6)
Mean Plasma Glucose: 105 mg/dL

## 2015-09-29 LAB — HEPATITIS C ANTIBODY (REFLEX)

## 2015-09-29 LAB — HCV COMMENT:

## 2015-09-29 MED ORDER — ASPIRIN 81 MG PO CHEW: 81.0000 mg | CHEWABLE_TABLET | Freq: Every day | ORAL | Status: DC

## 2015-09-29 MED ORDER — ASPIRIN EC 81 MG PO TBEC: 81.0000 mg | DELAYED_RELEASE_TABLET | Freq: Every day | ORAL | Status: DC

## 2015-09-29 MED ORDER — POLYETHYLENE GLYCOL 3350 17 G PO PACK
17.0000 g | PACK | Freq: Once | ORAL | Status: AC
  Administered 2015-09-29: 17 g via ORAL
  Filled 2015-09-29: qty 1

## 2015-09-29 MED ORDER — ATORVASTATIN CALCIUM 80 MG PO TABS: 80.0000 mg | ORAL_TABLET | Freq: Every day | ORAL | Status: DC

## 2015-09-29 MED ORDER — ASPIRIN-DIPYRIDAMOLE ER 25-200 MG PO CP12: 1.0000 | ORAL_CAPSULE | Freq: Two times a day (BID) | ORAL | Status: DC

## 2015-09-29 NOTE — Evaluation (Signed)
Occupational Therapy Evaluation and Discharge Summary Patient Details Name: [name] MRN: [id number] DOB: 1940-07-10 Today's Date: 09/29/2015    History of Present Illness Admitted with Acute right parietal subcortical infarcts. There is a possibility that these infarcts are cardioembolic in nature.  has a past medical history of Hypertension; Hyperlipemia; DJD (degenerative joint disease); Chronic low back pain; Abnormal electromyogram (EMG); Rotator cuff syndrome of right shoulder; Herniated lumbar intervertebral disc; Multiple joint pain; SVT (supraventricular tachycardia) (HCC); Bilateral leg edema; Peripheral neuropathy (HCC); Osteopenia; Diastolic dysfunction; Chest pain; Stroke [facility](HCC) (04/2013); and Dysrhythmia.   Clinical Impression   Pt admitted with the above diagnosis and overall is close to baseline with adls. Pt was using a cane PTA and now feels better with a walker but with walker is at Supervision level with all adls. Pt's daughter is going to stay with her for a few days as well.  PT to follow for balance.  No further acute OT needs at this time.    Follow Up Recommendations  No OT follow up;Supervision - Intermittent    Equipment Recommendations  3 in 1 bedside comode    Recommendations for Other Services       Precautions / Restrictions Precautions Precautions: Fall Precaution Comments: Fall risk greatly reduced with use of RW Restrictions Weight Bearing Restrictions: No      Mobility Bed Mobility               General bed mobility comments: Pt in chair on arrival.  Transfers Overall transfer level: Needs assistance Equipment used: Rolling walker (2 wheeled) Transfers: Sit to/from Stand Sit to Stand: Supervision         General transfer comment: No hands on assist needed.  Occasional cues for hand placement and walker safety given.  Pt likes to leave walker to the side while she turns to sit.  Instructed pt to keep walker with her.     Balance Overall balance assessment: Needs assistance Sitting-balance support: Feet supported Sitting balance-Leahy Scale: Good     Standing balance support: Bilateral upper extremity supported;During functional activity Standing balance-Leahy Scale: Fair Standing balance comment: Pt liked having RW with her but did stand at toilet and at sink for several seconds without outside support and no LOB.                            ADL Overall ADL's : Needs assistance/impaired Eating/Feeding: Independent;Sitting   Grooming: Wash/dry hands;Wash/dry face;Oral care;Supervision/safety;Standing   Upper Body Bathing: Set up;Sitting   Lower Body Bathing: Supervison/ safety;Sit to/from stand   Upper Body Dressing : Set up;Sitting   Lower Body Dressing: Supervision/safety;Sit to/from stand   Toilet Transfer: Supervision/safety;Ambulation;Regular Toilet;BSC   Toileting- Clothing Manipulation and Hygiene: Supervision/safety;Sit to/from stand       Functional mobility during ADLs: Supervision/safety General ADL Comments: Pt completed all adls with S.  Pt walked to bathroom with walker and did not need assist. Pt states knee bothers her from arthritis at all times and she feels better with walker.  Do not feel pt needs assist when she has walker with her.  PT to follow for balance.     Vision Vision Assessment?: No apparent visual deficits   Perception     Praxis      Pertinent Vitals/Pain Pain Assessment: 0-10 Pain Score: 2  Pain Location:  R knee Pain Descriptors / Indicators: Aching Pain Intervention(s): Limited activity within patient's tolerance;Monitored during session;Repositioned  Hand Dominance Right   Extremity/Trunk Assessment Upper Extremity Assessment Upper Extremity Assessment: Overall WFL for tasks assessed   Lower Extremity Assessment Lower Extremity Assessment: Defer to PT evaluation   Cervical / Trunk Assessment Cervical / Trunk Assessment:  Normal   Communication Communication Communication: No difficulties   Cognition Arousal/Alertness: Awake/alert Behavior During Therapy: WFL for tasks assessed/performed Overall Cognitive Status: Within Functional Limits for tasks assessed                     General Comments       Exercises       Shoulder Instructions      Home Living Family/patient expects to be discharged to:: Private residence Living Arrangements: Alone Available Help at Discharge: Family;Available PRN/intermittently Type of Home: Apartment Home Access: Stairs to enter Entrance Stairs-Number of Steps: 1 Entrance Stairs-Rails: None Home Layout: One level     Bathroom Shower/Tub: Tub/shower unit Shower/tub characteristics: Engineer, building services: Standard     Home Equipment: Medical laboratory scientific officer - single point          Prior Functioning/Environment Level of Independence: Independent with assistive device(s)        Comments: daughter helps her with IADLs    OT Diagnosis:     OT Problem List:     OT Treatment/Interventions:      OT Goals(Current goals can be found in the care plan section) Acute Rehab OT Goals Patient Stated Goal: Hopes to get home soon OT Goal Formulation: All assessment and education complete, DC therapy  OT Frequency:     Barriers to D/C:            Co-evaluation              End of Session Equipment Utilized During Treatment: Rolling walker Nurse Communication: Mobility status  Activity Tolerance: Patient tolerated treatment well Patient left: in chair;with call bell/phone within reach   Time: 0845-0900 OT Time Calculation (min): 15 min Charges:  OT General Charges $OT Visit: 1 Procedure OT Evaluation $OT Eval Low Complexity: 1 Procedure G-Codes:    Hope Budds 10/16/15, 9:07 AM  [phone]

## 2015-09-29 NOTE — Progress Notes (Signed)
  Date: 09/29/2015  Patient name: [name]  Medical record number: [id number]  Date of birth: May 17, 1941   This patient has been seen and the plan of care was discussed with the house staff. Please see their note for complete details. I concur with their findings with the following additions/corrections: [name] has complete her CVA W/U and is stable for D/C home. She will be on ASA plavix for 3 months. Home health PT.  Burns SpainElizabeth A Nadalyn Deringer, MD 09/29/2015, 3:39 PM

## 2015-09-29 NOTE — Progress Notes (Signed)
Physical Therapy Treatment Patient Details Name: [name] MRN: [id number] DOB: 1941-01-11 Today's Date: 09/29/2015    History of Present Illness Admitted with Acute right parietal subcortical infarcts. There is a possibility that these infarcts are cardioembolic in nature.  has a past medical history of Hypertension; Hyperlipemia; DJD (degenerative joint disease); Chronic low back pain; Abnormal electromyogram (EMG); Rotator cuff syndrome of right shoulder; Herniated lumbar intervertebral disc; Multiple joint pain; SVT (supraventricular tachycardia) (HCC); Bilateral leg edema; Peripheral neuropathy (HCC); Osteopenia; Diastolic dysfunction; Chest pain; Stroke [facility](HCC) (04/2013); and Dysrhythmia.    PT Comments    Ms. Tippetts walked more to day, and she not only tolerated walking well, but seemed to really enjoy walking; we discussed walking at home, perhaps with a neighbor of hers; we also discussed ongoing follow up with Orthopedics, as well as Neurology  Follow Up Recommendations  Home health PT;Supervision - Intermittent     Equipment Recommendations  Rolling walker with 5" wheels;3in1 (PT)    Recommendations for Other Services       Precautions / Restrictions Precautions Precautions: Fall Precaution Comments: Fall risk greatly reduced with use of RW    Mobility  Bed Mobility                  Transfers Overall transfer level: Needs assistance Equipment used: Rolling walker (2 wheeled) Transfers: Sit to/from Stand Sit to Stand: Supervision         General transfer comment: No hands on assist needed.  Occasional cues for hand placement and walker safety given.  Pt likes to leave walker to the side while she turns to sit.  Instructed pt to keep walker with her.  Ambulation/Gait Ambulation/Gait assistance: Supervision Ambulation Distance (Feet): 300 Feet Assistive device: Rolling walker (2 wheeled) Gait Pattern/deviations: Step-through pattern;Decreased stride  length (Decr R knee flexion in swing)   Gait velocity interpretation: Below normal speed for age/gender General Gait Details: Managing well with cane   Stairs Stairs: Yes Stairs assistance: Supervision Stair Management: One rail Right;Forwards Number of Stairs: 1 General stair comments: Cues for sequence  Wheelchair Mobility    Modified Rankin (Stroke Patients Only) Modified Rankin (Stroke Patients Only) Pre-Morbid Rankin Score: Slight disability Modified Rankin: Moderate disability     Balance             Standing balance-Leahy Scale: Fair                      Cognition Arousal/Alertness: Awake/alert Behavior During Therapy: WFL for tasks assessed/performed Overall Cognitive Status: Within Functional Limits for tasks assessed                      Exercises      General Comments        Pertinent Vitals/Pain Pain Assessment: No/denies pain Pain Location: Reports R knee pain and stiffness are improved with getting up and walking    Home Living                      Prior Function            PT Goals (current goals can now be found in the care plan section) Acute Rehab PT Goals Patient Stated Goal: Hopes to get home soon PT Goal Formulation: With patient Time For Goal Achievement: 10/05/15 Potential to Achieve Goals: Good Progress towards PT goals: Progressing toward goals    Frequency  Min 4X/week    PT Plan Current plan  remains appropriate    Co-evaluation             End of Session Equipment Utilized During Treatment: Gait belt Activity Tolerance: Patient tolerated treatment well Patient left: in chair;with call bell/phone within reach;with family/visitor present     Time: 4098-1191 PT Time Calculation (min) (ACUTE ONLY): 28 min  Charges:  $Gait Training: 23-37 mins                    G Codes:      Olen Pel 09/29/2015, 2:53 PM  Van Clines, [place]  Acute Rehabilitation Services Pager  [phone] Office [phone]

## 2015-09-29 NOTE — Discharge Instructions (Signed)
Cardiology office will call you to set you up for an outpatient Holter monitor.

## 2015-09-29 NOTE — Care Management Note (Signed)
Case Management Note  Patient Details  Name: [name] MRN: [id number] Date of Birth: 11-20-1940  Subjective/Objective:                    Action/Plan: Patient was admitted with CVA. Lives at home alone. Will follow for discharge needs pending PT/OT evals and physician orders.  Expected Discharge Date:                  Expected Discharge Plan:     In-House Referral:     Discharge planning Services     Post Acute Care Choice:    Choice offered to:     DME Arranged:    DME Agency:     HH Arranged:    HH Agency:     Status of Service:  In process, will continue to follow  Medicare Important Message Given:    Date Medicare IM Given:    Medicare IM give by:    Date Additional Medicare IM Given:    Additional Medicare Important Message give by:     If discussed at Long Length of Stay Meetings, dates discussed:    Additional Comments:  Anda KraftRobarge, [place] Holloran C, RN 09/29/2015, 10:30 AM [phone]

## 2015-09-29 NOTE — Progress Notes (Signed)
Subjective: No acute overnight events. Patient denies having any focal weakness at present. States she is still experiencing intermittent numbness on the left side of her face and left arm. Denies having any lightheadedness, palpitations, SOB, or CP at present. However, states she experiences palpitations lasting a few seconds- min at least once a week at home and these episodes are associated with lightheadedness.  Objective: Vital signs in last 24 hours: Filed Vitals:   09/28/15 2131 09/29/15 0105 09/29/15 0522 09/29/15 1037  BP: 145/72 136/72 137/82 140/78  Pulse: 76 73 78 80  Temp: 98.6 F (37 C) 98.6 F (37 C) 98.4 F (36.9 C) 98 F (36.7 C)  TempSrc: Oral Oral Oral Oral  Resp: Height:      Weight:      SpO2: 99% 98% 99% 99%   Weight change:  No intake or output data in the 24 hours ending 09/29/15 1610  Physical Exam: Constitutional: In no acute distress. Sitting on bedside chair. Daughter present in the room.   Eyes: EOM are normal.   Cardiovascular: Normal rate and regular rhythm.No murmur, rub, or gallop appreciated.  Pulmonary/Chest: Effort normal. No respiratory distress. She has no wheezes. She has no rales.  Abdominal: Soft. Bowel sounds are normal. She exhibits no distension. There is no tenderness.  Musculoskeletal: Normal range of motion. She exhibits edema and tenderness.  Pitting edema b/l lower extremities: +2 at feet up to distal shin, trace edema to just below the knees Neurological: She is alert and oriented to person, place, and time. She has normal strength. She displays no tremor. No cranial nerve deficit or sensory deficit. She exhibits normal muscle tone. Coordination normal.  Sensation to touch equal and intact bilateral face, arms, and legs. Finger to nose intact b/l.  Skin: She is not diaphoretic.   Lab Results: Basic Metabolic Panel:  Recent Labs Lab 09/27/15 1746 09/27/15 1757 09/28/15 0515  NA 135 140 140  K 3.4* 3.5 3.5    CL 101 100* 105  CO2 24  --  27  GLUCOSE 132* 130* 104*  BUN CREATININE 0.48 0.50 0.49  CALCIUM 9.6  --  9.5  MG  --   --  1.8   Liver Function Tests:  Recent Labs Lab 09/27/15 1746  AST 16  ALT 13*  ALKPHOS 101  BILITOT 1.1  PROT 6.2*  ALBUMIN 3.6   CBC:  Recent Labs Lab 09/27/15 1746 09/27/15 1757  WBC 4.8  --   NEUTROABS 3.1  --   HGB 11.5* 12.6  HCT 36.9 37.0  MCV 85.4  --   PLT 140*  --    CBG:  Recent Labs Lab 09/27/15 1745  GLUCAP 128*   Fasting Lipid Panel:  Recent Labs Lab 09/28/15 0515  CHOL 166  HDL 52  LDLCALC 104*  TRIG 50  CHOLHDL 3.2   Coagulation:  Recent Labs Lab 09/27/15 1746  LABPROT 14.3  INR 1.09   Anemia Panel:  Recent Labs Lab 09/28/15 0515  VITAMINB12 1149*   Urine Drug Screen: Drugs of Abuse     Component Value Date/Time   LABOPIA NONE DETECTED 05/29/2013 1159   COCAINSCRNUR NONE DETECTED 05/29/2013 1159   LABBENZ NONE DETECTED 05/29/2013 1159   AMPHETMU NONE DETECTED 05/29/2013 1159   THCU NONE DETECTED 05/29/2013 1159   LABBARB NONE DETECTED 05/29/2013 1159    Micro Results: No results found for this or any previous visit (from the past 240  hour(s)). Studies/Results: Ct Head Wo Contrast  09/27/2015  CLINICAL DATA:  75 year old presenting with acute onset of headache, dizziness and blurred vision that began this morning. EXAM: CT HEAD WITHOUT CONTRAST TECHNIQUE: Contiguous axial images were obtained from the base of the skull through the vertex without intravenous contrast. COMPARISON:  CT head 05/29/2013, 05/08/2013. MRI brain 05/29/2013, 05/08/2013. FINDINGS: Ventricular system normal in size and appearance for age. No significant atrophy for age. Mild to moderate changes of small vessel disease of the white matter diffusely, including the pons and brainstem, unchanged. No mass lesion. No midline shift. No acute hemorrhage or hematoma. No extra-axial fluid collections. No evidence of acute  infarction. No skull fracture or other focal osseous abnormality involving the skull. Visualized paranasal sinuses, bilateral mastoid air cells and bilateral middle ear cavities well-aerated. Minimal bilateral carotid siphon and vertebral artery atherosclerosis. IMPRESSION: 1. No acute intracranial abnormality. 2. Stable mild to moderate chronic microvascular ischemic changes of the white matter diffusely, including the pons and brainstem. Electronically Signed   By: Hulan Saashomas  Lawrence M.D.   On: 09/27/2015 18:18   Mr Angiogram Neck W Wo Contrast  09/28/2015  CLINICAL DATA:  Acute nonhemorrhagic right parietal infarct. New onset left perioral on left hand numbness 1 week ago. EXAM: MRA NECK WITHOUT AND WITH CONTRAST TECHNIQUE: Multiplanar and multiecho pulse sequences of the neck were obtained without and with intravenous contrast. Angiographic images of the neck were obtained using MRA technique without and with intravenous contrast. CONTRAST:  20mL MULTIHANCE GADOBENATE DIMEGLUMINE 529 MG/ML IV SOLN COMPARISON:  MRI brain 09/27/2015.  MRA head 09/28/2015. FINDINGS: The time-of-flight images demonstrate no significant flow disturbance at either carotid bifurcation. Flow is antegrade in the vertebral arteries. The postcontrast images demonstrate a 3 vessel arch configuration. The right common carotid artery is within normal limits. Bifurcation is unremarkable. Cervical right ICA is normal. The left common carotid artery demonstrates mild tortuosity without focal stenosis. The carotid bifurcation is within normal limits. Cervical ICA is normal. Both vertebral arteries originate from the subclavian arteries. There is mild proximal tortuosity without stenosis. The vertebral arteries are intact throughout the neck bilaterally. IMPRESSION: 1. Mild proximal tortuosity of the left common carotid artery and the left vertebral artery without significant stenosis. 2. No significant carotid bifurcation disease. Electronically  Signed   By: Marin Robertshristopher  Mattern M.D.   On: 09/28/2015 11:40   Mr Brain Wo Contrast  09/27/2015  CLINICAL DATA:  Headache and dizziness since waking up this morning. Nausea. Extremity swelling. History of hypertension, hyperlipidemia. EXAM: MRI HEAD WITHOUT CONTRAST TECHNIQUE: Multiplanar, multiecho pulse sequences of the brain and surrounding structures were obtained without intravenous contrast. COMPARISON:  CT head September 27, 2015 at 1813 hours and MRI head May 29, 2013 FINDINGS: Subcentimeter focus of reduced diffusion RIGHT parietal lobe, low ADC values. No susceptibility artifact to suggest hemorrhage. No susceptibility artifact to suggest hemorrhage. Ventricles and sulci are normal for patient's age. Patchy to confluent supratentorial and pontine white matter FLAIR T2 hyperintensities without midline shift, mass effect masses. Old bilateral basal ganglia and thalamus lacunar infarcts. No abnormal extra-axial fluid collections. No extra-axial masses though, contrast enhanced sequences would be more sensitive. Normal major intracranial vascular flow voids seen at the skull base. Ocular globes and orbital contents are unremarkable though not tailored for evaluation. Mildly expanded partial empty sella. No suspicious calvarial bone marrow signal. Craniocervical junction maintained. Visualized paranasal sinuses and mastoid air cells are well-aerated. Patient is edentulous. IMPRESSION: Acute subcentimeter RIGHT parietal lobe infarct compatible with  small vessel ischemic disease. Chronic changes including moderate to severe chronic small vessel ischemic disease and old bilateral basal ganglia and thalamus lacunar infarcts. Electronically Signed   By: Awilda Metro M.D.   On: 09/27/2015 23:27   Mr Maxine Glenn Head/brain Wo Cm  09/28/2015  CLINICAL DATA:  Dizziness, headache and LEFT facial numbness. History of hypertension hyperlipidemia. EXAM: MRA HEAD WITHOUT CONTRAST TECHNIQUE: Angiographic images of the Circle  of Willis were obtained using MRA technique without intravenous contrast. COMPARISON:  MRI of the brain September 27, 2015 FINDINGS: Anterior circulation: Normal flow related enhancement of the included cervical, petrous, cavernous and supraclinoid internal carotid arteries. Patent anterior communicating artery. Normal flow related enhancement of the anterior and middle cerebral arteries, including distal segments. No large vessel occlusion, high-grade stenosis, abnormal luminal irregularity, aneurysm. Posterior circulation: Codominant vertebral arteries. Basilar artery is patent, with normal flow related enhancement of the main branch vessels. Normal flow related enhancement of the posterior cerebral arteries. Robust contribution from bilateral posterior communicating arteries. Probable slow flow LEFT P4 segment. No large vessel occlusion, high-grade stenosis, abnormal luminal irregularity, aneurysm. IMPRESSION: Negative MRA head.  Complete circle of Willis. Electronically Signed   By: Awilda Metro M.D.   On: 09/28/2015 02:26   Medications: I have reviewed the patient's current medications. Scheduled Meds: .  stroke: mapping our early stages of recovery book   Does not apply Once  . atorvastatin  80 mg Oral q1800  . calcium-vitamin D  1 tablet Oral BID  . clopidogrel  75 mg Oral Daily  . enoxaparin (LOVENOX) injection  40 mg Subcutaneous Q24H   Continuous Infusions:  PRN Meds:.acetaminophen **OR** acetaminophen, ondansetron (ZOFRAN) IV, senna-docusate Assessment/Plan: Principal Problem:   Acute CVA (cerebrovascular accident) (HCC) Active Problems:   Essential hypertension   Chronic diastolic (congestive) heart failure (HCC)   Acute right MCA stroke (HCC)  75 year old woman with PMH of CVA on Plavix (thalamic and basal ganglia infarcts 2014), Paroxysmal SVT (previosly on Digoxin), and Osteoarthritis who presents with left hand and left face numbness, found to have an acute right parietal lobe  infarct.  Acute Right Parietal Lobe Infarct: Continues to have subjective left face and arm numbness. Bubble study was done and did not mention a PFO. LE dopplers negative for DVT. MRA of head did not show any acute abnormality. MRA of neck showing mild proximal tortuosity of the L common carotid artery and L vertebral artery without significant stenosis. No significant carotid bifurcation disease. Lipid panel showing slightly elevated LDL 104 but is otherwise normal. A1c normal. Telemetry showing irregular heart rhythm and an episode of a skipped beat. However, no A-fib or A-flutter noted.  -Patient is stable and will likely be discharged to home today. We called cards master to set patient up for an outpatient Holter monitor -Neurology following, appreciate assistance -Atorvastatin 80 mg daily -Continue Plavix 75 mg daily  -Start Aspirin 81 mg daily  -Neuro checks   Diastolic CHF: Echo done today 06/26/08 showing LVEF 60-65%, grade 2 diastolic dysfunction, and trivial mitral regurgitation. Patient with increased bilateral lower extremity edema over the last week likely related to her dCHF. She takes Lasix 60 mg total daily (20 mg tabs, 2 am, 1 pm) with daily potassium supplement. -Hold Lasix for permissive HTN -Monitor BMP  HTN: Allow permissive HTN <220/120 -Hold home Atenolol 50 mg daily -Hold home Lasix  Diet: Heart healthy   DVT ppx: Lovenox  Code: FULL  Dispo: Patient is stable and will likely be  discharged today.   The patient does have a current PCP Para March Orma Flaming, MD) and does need an [facility] hospital follow-up appointment after discharge.  The patient does not have transportation limitations that hinder transportation to clinic appointments.  .Services Needed at time of discharge: Y = Yes, Blank = No PT:   OT:   RN:   Equipment:   Other:     LOS: 1 day   John Giovanni, MD 09/29/2015, 4:10 PM

## 2015-09-29 NOTE — Care Management Note (Signed)
Case Management Note  Patient Details  Name: [name] MRN: [id number] Date of Birth: 07-15-1940  Subjective/Objective:                    Action/Plan: Plan is to discharge home with HHPT. CM met with the patient and provided her a list of [facility] agencies in the [place] area. She selected [facility]. Tiffany with [facility] notified and accepted the referral. Pt also ordered 3 in 1 and a walker. Jermaine with [facility] DME notified and will deliver the equipment to the room. Will update the bedside RN.   Expected Discharge Date:                  Expected Discharge Plan:  [place]  In-House Referral:     Discharge planning Services  CM Consult  Post Acute Care Choice:  Durable Medical Equipment, Home Health Choice offered to:  Patient  DME Arranged:  3-N-1, Walker rolling DME Agency:  [place]:  PT [place]:  [place]  Status of Service:  Completed, signed off  Medicare Important Message Given:    Date Medicare IM Given:    Medicare IM give by:    Date Additional Medicare IM Given:    Additional Medicare Important Message give by:     If discussed at [place] of Stay Meetings, dates discussed:    Additional Comments:  Pollie Friar, RN 09/29/2015, 11:55 AM

## 2015-09-29 NOTE — Progress Notes (Signed)
Patient is discharged from room 5C21 at this time. Alert and in stable condition. IV site d/c'd as well as tele. EMMI education done. Instructions read to patient with understanding verbalized.Left unit with all belongings and family at side.

## 2015-09-29 NOTE — Progress Notes (Signed)
STROKE TEAM PROGRESS NOTE    SUBJECTIVE (INTERVAL HISTORY) No family at bedside. Patient up in chair - sates she has a hx of fast heart beat, not sure if afib but she was put on digoxin in the past, but not now, she has had "attacks" in the past , but none recently, so she feels she is fine. She is not clear if her attacks were AF, SVT or other arrhythmia. She has hx of stroke and follows with Dr. Pearlean Brownie in clinic, last visit 01/22/15. She is on plavix and lipitor at home.   OBJECTIVE Temp:  [97.8 F (36.6 C)-98.6 F (37 C)] 98.4 F (36.9 C) (04/10 0522) Pulse Rate:  [73-79] 78 (04/10 0522) Cardiac Rhythm:  [-] Normal sinus rhythm (04/10 0700) Resp:  [16-18] 18 (04/10 0522) BP: (127-145)/(68-82) 137/82 mmHg (04/10 0522) SpO2:  [98 %-100 %] 99 % (04/10 0522)  CBC:   Recent Labs Lab 09/27/15 1746 09/27/15 1757  WBC 4.8  --   NEUTROABS 3.1  --   HGB 11.5* 12.6  HCT 36.9 37.0  MCV 85.4  --   PLT 140*  --     Basic Metabolic Panel:   Recent Labs Lab 09/27/15 1746 09/27/15 1757 09/28/15 0515  NA 135 140 140  K 3.4* 3.5 3.5  CL 101 100* 105  CO2 24  --  27  GLUCOSE 132* 130* 104*  BUN CREATININE 0.48 0.50 0.49  CALCIUM 9.6  --  9.5  MG  --   --  1.8    Lipid Panel:     Component Value Date/Time   CHOL 166 09/28/2015 0515   TRIG 50 09/28/2015 0515   HDL 52 09/28/2015 0515   CHOLHDL 3.2 09/28/2015 0515   VLDL 10 09/28/2015 0515   LDLCALC 104* 09/28/2015 0515   HgbA1c:  Lab Results  Component Value Date   HGBA1C 5.3 09/28/2015   Urine Drug Screen:     Component Value Date/Time   LABOPIA NONE DETECTED 05/29/2013 1159   COCAINSCRNUR NONE DETECTED 05/29/2013 1159   LABBENZ NONE DETECTED 05/29/2013 1159   AMPHETMU NONE DETECTED 05/29/2013 1159   THCU NONE DETECTED 05/29/2013 1159   LABBARB NONE DETECTED 05/29/2013 1159      IMAGING I have personally reviewed the radiological images below and agree with the radiology interpretations.  Ct Head Wo  Contrast 09/27/2015   1. No acute intracranial abnormality.  2. Stable mild to moderate chronic microvascular ischemic changes of the white matter diffusely, including the pons and brainstem.   Mr Brain Wo Contrast 09/27/2015   Acute subcentimeter RIGHT parietal lobe infarct compatible with small vessel ischemic disease. Chronic changes including moderate to severe chronic small vessel ischemic disease and old bilateral basal ganglia and thalamus lacunar infarcts.   Mr [facility] and Wo Cm 09/28/2015   Negative MRA head.  Complete circle of Willis.  MRA neck with contrast 1. Mild proximal tortuosity of the left common carotid artery and the left vertebral artery without significant stenosis. 2. No significant carotid bifurcation disease.  2D Echocardiogram  - Left ventricle: The cavity size was normal. Systolic function was normal. The estimated ejection fraction was in the range of 60% to 65%. Wall motion was normal; there were no regional wall motion abnormalities. There was an increased relative contribution of atrial contraction to ventricular filling. Doppler parameters are consistent with abnormal left ventricular relaxation (grade 1 diastolic dysfunction). - Mitral valve: There was trivial regurgitation.   PHYSICAL EXAM  Exam: Gen: NAD Eyes: anicteric sclerae, moist conjunctivae                    CV: no MRG, no carotid bruits, no peripheral edema Mental Status: Alert, follows commands, good historian  Neuro: Detailed Neurologic Exam  Speech:    No aphasia, no dysarthria  Cranial Nerves:    The pupils are equal, round, and reactive to light.. Facial sensation symmetric. Attempted, Fundi not visualized.  EOMI. Midline conjugate gaze. . Visual fields full. Face symmetric, Tongue midline. Hearing intact to voice. Shoulder shrug intact  Motor Observation:    no involuntary movements noted. Tone appears normal.     Strength:   Intact, mild weakness of the right leg proximally  moreso due to knee pain     Sensation:  Intact to LT  Gait:  Able to stand from seating independently and walk to bed with assistance.   Coordination:  No dysmetria  Plantars downgoing.    ASSESSMENT/PLAN [name] is a 75 y.o. female with history of paroxysmal atrial fibrillation without anticoagulation, iron deficient anemia, diastolic dysfunction, previous stroke, and peripheral neuropathy presenting with acute onset of left perioral and left hand numbness approximately one week prior to admission. She did not receive IV t-PA due to late presentation.  Stroke:  R parietal lobe infarct, etiology unclear. Cannot rule out embolic source.  Resultant  resolved  MRI - acute subcentimeter RIGHT parietal lobe infarct   MRA head and neck - negative.  Bilateral lower extremity venous duplex - negative for DVT   2D Echo - EF 60-65% No source of embolus   LDL - 104  HgbA1c - 5.3  VTE prophylaxis - Lovenox Diet Heart Room service appropriate?: Yes; Fluid consistency:: Thin  clopidogrel 75 mg daily prior to admission, now on clopidogrel 75 mg daily. Given failed plavix with recurrent minor stroke, recommend patient should be treated with aspirin 81 mg and clopidogrel 75 mg orally every day x 3 months for secondary stroke prevention. After 3 months, change to plavix alone. Long-term dual antiplatelets are contraindicated due to risk for intracerebral hemorrhage. (Per note from Dr. Patty Sermons in 04/2013, pt without hx of stroke or atrial fibrillation at that time)  Patient counseled to be compliant with her antithrombotic medications  Ongoing aggressive stroke risk factor management  Recommend outpt 30 day cardiac event monitoring to look for atrial fibrillation   Therapy recommendations: HH PT, no OT  Disposition: return home  Follow up with Dr. Pearlean Brownie in stroke clinic at [facility]  ? Arrhythmia   It seems that pt has arrhythmia in the past, likely SVT, but not able to rule  out afib  Dr. Patty Sermons note in 04/2013, no clear hx of afib  Not on [facility] but on digoxin in the past  Recommend outpt 30 day cardiac event monitoring to look for atrial fibrillation  No need AC at this time  Follow up with Dr. Pearlean Brownie in stroke clinic at [facility]  Hypertension  Stable  Permissive hypertension (OK if < 220/120) but gradually normalize in 5-7 days  Hyperlipidemia  Home meds:  Lipitor 40 mg daily   LDL 104, goal < 70  Now on Lipitor 80 mg daily  Continue statin at discharge  Other Stroke Risk Factors  Advanced age  Cigarette smoker, quit smoking 16 years ago  Obesity, Body mass index is 34.43 kg/(m^2).   Hx old strokes by MRI  Neurology will sign off. Please call with questions. Pt will  follow up with Dr. Pearlean [facility]ieSethi at [facility] in about 6-8 weeks. Thanks for the consult.  Marvel PlanJindong Magnum Lunde, MD PhD Stroke Neurology 09/29/2015 2:16 PM    To contact Stroke Continuity provider, please refer to [email or web address]. After hours, contact General Neurology

## 2015-10-01 NOTE — Discharge Summary (Signed)
Name: [name] MRN: [id number] DOB: 1940/08/24 [age] y.o. PCP: Tyson Aliasuncan Thomas Vincent, MD  Date of Admission: 09/27/2015  8:49 PM Date of Discharge: 09/29/2015 Attending Physician: No att. providers found  Discharge Diagnosis:  Principal Problem:   Acute CVA (cerebrovascular accident) [facility](HCC) Active Problems:   Essential hypertension   Chronic diastolic (congestive) heart failure (HCC)   Acute right MCA stroke (HCC)  Discharge Medications:   Medication List    TAKE these medications        acetaminophen 500 MG tablet  Commonly known as:  TYLENOL  Take 1,000 mg by mouth every 6 (six) hours as needed for moderate pain.     alendronate 70 MG tablet  Commonly known as:  FOSAMAX  Take 1 tablet (70 mg total) by mouth every 7 (seven) days. Take with a full glass of water on an empty stomach.     aspirin EC 81 MG tablet  Take 1 tablet (81 mg total) by mouth daily.     atenolol 50 MG tablet  Commonly known as:  TENORMIN  TAKE 1 TABLET (50 MG TOTAL) BY MOUTH DAILY.     atorvastatin 80 MG tablet  Commonly known as:  LIPITOR  Take 1 tablet (80 mg total) by mouth daily at 6 PM.     Calcium-Vitamin D3 600-400 MG-UNIT Tabs  Take 1 tablet by mouth 2 (two) times daily.     clopidogrel 75 MG tablet  Commonly known as:  PLAVIX  Take 1 tablet (75 mg total) by mouth daily.     [employer] VITAMIN D 2000 units Caps  Generic drug:  Cholecalciferol  TAKE 1 CAPSULE EVERY DAY     furosemide 20 MG tablet  Commonly known as:  LASIX  Take 3 tablets (60 mg total) by mouth daily. Take 2 tablets in the morning and 1 tablet at night     KLOR-CON M20 20 MEQ tablet  Generic drug:  potassium chloride SA  TAKE 1 TABLET (20 MEQ TOTAL) BY MOUTH DAILY.     Vitamin B-12 500 MCG Subl  Place 1 tablet under the tongue daily.        Disposition and follow-up:   [name] [facility] Good condition.  At the hospital follow up visit please address:  1.   Acute Right Parietal Lobe Infarct - please make sure cardiology has set up an outpatient 30 day cardiac event monitor for the patient. She needs to continue dual anti-platelet therapy with Aspirin 81 mg daily and Plavix 75 mg daily for 3 months. After 3 months, discontinue Aspirin and continue Plavix alone. Long-term dual antiplatelets are contraindicated due to risk of intracerebral hemorrhage.   2.  Labs / imaging needed at time of follow-up:  3.  Pending labs/ test needing follow-up:   Follow-up Appointments:     Follow-up Information    Follow up with [name], [name], MD. Go on 10/13/2015.   Specialty:  Internal Medicine   Why:  Follow up appointment at 10:15 am.    Contact information:   [address] [place] [address] [phone]       Follow up with Cook,[name], MD. Schedule an appointment as soon as possible for a visit in 2 months.   Specialties:  Neurology, Radiology   Why:  stroke clinic, Follow up   Contact information:   [address] [place] [address] [phone]       Discharge Instructions: Discharge Instructions  Ambulatory referral to Neurology    Complete by:  As directed   Pt will follow up with Dr. Pearlean Cook at [facility] in about 6-8 weeks. Thanks.           Consultations:   Neurology   Procedures Performed:  Ct Head Wo Contrast  09/27/2015  CLINICAL DATA:  75 year old presenting with acute onset of headache, dizziness and blurred vision that began this morning. EXAM: CT HEAD WITHOUT CONTRAST TECHNIQUE: Contiguous axial images were obtained from the base of the skull through the vertex without intravenous contrast. COMPARISON:  CT head 05/29/2013, 05/08/2013. MRI brain 05/29/2013, 05/08/2013. FINDINGS: Ventricular system normal in size and appearance for age. No significant atrophy for age. Mild to moderate changes of small vessel disease of the white matter diffusely, including the pons and brainstem, unchanged. No mass lesion. No midline  shift. No acute hemorrhage or hematoma. No extra-axial fluid collections. No evidence of acute infarction. No skull fracture or other focal osseous abnormality involving the skull. Visualized paranasal sinuses, bilateral mastoid air cells and bilateral middle ear cavities well-aerated. Minimal bilateral carotid siphon and vertebral artery atherosclerosis. IMPRESSION: 1. No acute intracranial abnormality. 2. Stable mild to moderate chronic microvascular ischemic changes of the white matter diffusely, including the pons and brainstem. Electronically Signed   By: Hulan Cook M.D.   On: 09/27/2015 18:18   Mr Angiogram Neck W Wo Contrast  09/28/2015  CLINICAL DATA:  Acute nonhemorrhagic right parietal infarct. New onset left perioral on left hand numbness 1 week ago. EXAM: MRA NECK WITHOUT AND WITH CONTRAST TECHNIQUE: Multiplanar and multiecho pulse sequences of the neck were obtained without and with intravenous contrast. Angiographic images of the neck were obtained using MRA technique without and with intravenous contrast. CONTRAST:  20mL MULTIHANCE GADOBENATE DIMEGLUMINE 529 MG/ML IV SOLN COMPARISON:  MRI brain 09/27/2015.  MRA head 09/28/2015. FINDINGS: The time-of-flight images demonstrate no significant flow disturbance at either carotid bifurcation. Flow is antegrade in the vertebral arteries. The postcontrast images demonstrate a 3 vessel arch configuration. The right common carotid artery is within normal limits. Bifurcation is unremarkable. Cervical right ICA is normal. The left common carotid artery demonstrates mild tortuosity without focal stenosis. The carotid bifurcation is within normal limits. Cervical ICA is normal. Both vertebral arteries originate from the subclavian arteries. There is mild proximal tortuosity without stenosis. The vertebral arteries are intact throughout the neck bilaterally. IMPRESSION: 1. Mild proximal tortuosity of the left common carotid artery and the left vertebral  artery without significant stenosis. 2. No significant carotid bifurcation disease. Electronically Signed   By: Marin Roberts M.D.   On: 09/28/2015 11:40   Mr Brain Wo Contrast  09/27/2015  CLINICAL DATA:  Headache and dizziness since waking up this morning. Nausea. Extremity swelling. History of hypertension, hyperlipidemia. EXAM: MRI HEAD WITHOUT CONTRAST TECHNIQUE: Multiplanar, multiecho pulse sequences of the brain and surrounding structures were obtained without intravenous contrast. COMPARISON:  CT head September 27, 2015 at 1813 hours and MRI head May 29, 2013 FINDINGS: Subcentimeter focus of reduced diffusion RIGHT parietal lobe, low ADC values. No susceptibility artifact to suggest hemorrhage. No susceptibility artifact to suggest hemorrhage. Ventricles and sulci are normal for patient's age. Patchy to confluent supratentorial and pontine white matter FLAIR T2 hyperintensities without midline shift, mass effect masses. Old bilateral basal ganglia and thalamus lacunar infarcts. No abnormal extra-axial fluid collections. No extra-axial masses though, contrast enhanced sequences would be more sensitive. Normal major intracranial vascular flow voids seen at the skull base. Ocular globes  and orbital contents are unremarkable though not tailored for evaluation. Mildly expanded partial empty sella. No suspicious calvarial bone marrow signal. Craniocervical junction maintained. Visualized paranasal sinuses and mastoid air cells are well-aerated. Patient is edentulous. IMPRESSION: Acute subcentimeter RIGHT parietal lobe infarct compatible with small vessel ischemic disease. Chronic changes including moderate to severe chronic small vessel ischemic disease and old bilateral basal ganglia and thalamus lacunar infarcts. Electronically Signed   By: Awilda Metro M.D.   On: 09/27/2015 23:27   Mr Maxine Glenn Head/brain Wo Cm  09/28/2015  CLINICAL DATA:  Dizziness, headache and LEFT facial numbness. History of  hypertension hyperlipidemia. EXAM: MRA HEAD WITHOUT CONTRAST TECHNIQUE: Angiographic images of the Circle of Willis were obtained using MRA technique without intravenous contrast. COMPARISON:  MRI of the brain September 27, 2015 FINDINGS: Anterior circulation: Normal flow related enhancement of the included cervical, petrous, cavernous and supraclinoid internal carotid arteries. Patent anterior communicating artery. Normal flow related enhancement of the anterior and middle cerebral arteries, including distal segments. No large vessel occlusion, high-grade stenosis, abnormal luminal irregularity, aneurysm. Posterior circulation: Codominant vertebral arteries. Basilar artery is patent, with normal flow related enhancement of the main branch vessels. Normal flow related enhancement of the posterior cerebral arteries. Robust contribution from bilateral posterior communicating arteries. Probable slow flow LEFT P4 segment. No large vessel occlusion, high-grade stenosis, abnormal luminal irregularity, aneurysm. IMPRESSION: Negative MRA head.  Complete circle of Willis. Electronically Signed   By: Awilda Metro M.D.   On: 09/28/2015 02:26    Echo bubble study (09/28/15):  Study Conclusions  - Left ventricle: The cavity size was normal. Systolic function was  normal. The estimated ejection fraction was in the range of 60%  to 65%. Wall motion was normal; there were no regional wall  motion abnormalities. There was an increased relative  contribution of atrial contraction to ventricular filling.  Doppler parameters are consistent with abnormal left ventricular  relaxation (grade 1 diastolic dysfunction). - Mitral valve: There was trivial regurgitation.  Admission HPI: Ms. Yolander Goodie is a 75 year old woman with PMH of CVA on Plavix (thalamic and basal ganglia infarcts 2014), Grade 2 diastolic CHF (EF 16-10 %, mod pulm HTN, possible PFO seen on TTE 03/2015), Paroxysmal SVT (previosly on Digoxin), and  Osteoarthritis who presents with left hand and left face numbness. Patient was at her usual state of health until yesterday morning when she noticed numbness of her left hand and left face while at rest. She stood up and had dizziness and blurred vision. She had associated nausea, headache, fatigue, and shortness of breath. She denied any facial droop, slurred speech, confusion, focal weakness, vomiting, chest pain, palpitations, diaphoresis, or seizure-like activity. She says she walks with a cane due to right knee pain from OA. She says her gait is unchanged and she has not fallen or lost consciousness. She has noticed increased swelling of both her legs for the last week which improves with elevation and worsens when standing. She reports a history of phlebitis, but is unsure of any prior blood clots. When seen in the ED she feels that she is at her baseline, except for some continued numbness in her left hand, which is improving.  She had prior CVAs in November and December 2014 with similar presentation. Her first episode was considered a possible TIA as imaging did not show acute infarct. Her second episode was considered a failure of aspirin and she was started on Plavix at that time for secondary prevention. Both episodes were  considered secondary to small vessel ischemic disease. Patient states she has been taking her Plavix daily.  She is a former smoker, quit 30 years ago. She denies any alcohol or illicit drug use. She reports a history of ovarian cancer and HTN in her mother.  In the ED, her vitals were: BP 161/92 mmHg  Pulse 68  Temp(Src) 98.4 F (36.9 C) (Oral)  Resp 16  SpO2 98%  CT head showed mild-mod chronic microvascular ischemic changes diffusely, but no acute changes. MRI Brain showed an acute subcentimeter right parietal lobe infarct.   Hospital Course by problem list: Principal Problem:   Acute CVA (cerebrovascular accident) [facility]) Active Problems:   Essential  hypertension   Chronic diastolic (congestive) heart failure (HCC)   Acute right MCA stroke (HCC)   Acute Right Parietal Lobe Infarct: Patient presented without apparent neurological deficit on exam other than some continued subjective left face and left arm numbness. MRI of brain showed an acute subcentimeter right parietal lobe infarct compatible with small vessel ischemic disease. Also showed chronic changes including moderate to severe chronic small vessel ischemic disease and old bilateral basal ganglia and thalamus lacunar infarcts. Bubble study was done and did not mention a PFO. LE dopplers were negative for DVT. MRA of head did not show any acute abnormality. MRA of neck showed mild proximal tortuosity of the L common carotid artery and L vertebral artery without significant stenosis. No significant carotid bifurcation disease. Lipid panel showed slightly elevated LDL 104 but was otherwise normal. A1c was normal. Telemetry showed an irregular heart rhythm and an episode of a skipped beat. However, no A-fib or A-flutter were noted. Patient continued to have intermittent subjective left face and left arm numbness. She was already taking a statin and Plavix 75 mg daily at home. She was also started on Aspirin 81 mg daily for dual antiplatelet coverage as per neurology recommendation. In addition, we spoke to cardiology about setting up patient for an outpatient 30 day cardiac event monitor. She is to follow-up with neurology in 2 months and has been scheduled to follow-up at the [facility] on 10/13/15. She will be receiving home health physical therapy.   Discharge Vitals:   BP 140/78 mmHg  Pulse 80  Temp(Src) 98 F (36.7 C) (Oral)  Resp 18  Ht  (1.702 m)  Wt 219 lb 14.4 oz (99.746 kg)  BMI 34.43 kg/m2  SpO2 99%  LMP  (LMP Unknown)  Discharge Labs:  No results found for this or any previous visit (from the past 24 hour(s)).  Signed: John Giovanni,  MD 10/01/2015, 8:18 AM    Services Ordered on Discharge: HHPT Equipment Ordered on Discharge: Rolling walker, 3-in-1

## 2015-10-02 ENCOUNTER — Other Ambulatory Visit: Payer: Self-pay | Admitting: Student in an Organized Health Care Education/Training Program

## 2015-10-06 NOTE — Telephone Encounter (Signed)
Last OV November 2016 but has upcoming appointment 10/13/15

## 2015-10-10 ENCOUNTER — Telehealth: Payer: Self-pay

## 2015-10-10 ENCOUNTER — Telehealth: Payer: Self-pay | Admitting: Student in an Organized Health Care Education/Training Program

## 2015-10-10 NOTE — Telephone Encounter (Signed)
As summarized above: I personally reviewed recent hospital record. patients symptoms are identical to those she had in the hospital recently. New acute CVA, sensory but no motor symptoms, no focal deficits on exam. She is on maximum dual antiplatelet & statin therapy. There is nothing to add at this time. She is advised to call if she develops new signs or symptoms.

## 2015-10-10 NOTE — Telephone Encounter (Signed)
Late documentation for 1345   Mary, [facility] while in patients home this afternoon.  Patient is describing tingling in her left lips and left hand up to her forearm that started 24 hours ago.  No other neuro changes reported (confirmed no weakness in either extremity, no slurred speech, no change to symmetry of face).  Patient reports compliance with her plavix and aspirin.    Discussed with Dr. Cyndie ChimeGranfortuna and, after extensive chart review, we have advised patient to keep her follow up appointment in clinic on Monday 4/24 and to seek care in ED or call 911 if she experiences any new neurological changes, such as slurred speech or sudden weakness/loss of strength in one or more extremity.  Patient voiced understanding.

## 2015-10-10 NOTE — Telephone Encounter (Signed)
APPT. REMINDER CALL, LMTCB °

## 2015-10-13 ENCOUNTER — Ambulatory Visit (INDEPENDENT_AMBULATORY_CARE_PROVIDER_SITE_OTHER): Payer: Medicare Other | Admitting: Pulmonary Disease

## 2015-10-13 ENCOUNTER — Encounter: Payer: Self-pay | Admitting: *Deleted

## 2015-10-13 ENCOUNTER — Encounter: Payer: Self-pay | Admitting: Pulmonary Disease

## 2015-10-13 VITALS — BP 119/67 | HR 72 | Temp 98.1°F | Wt 222.7 lb

## 2015-10-13 DIAGNOSIS — Z79899 Other long term (current) drug therapy: Secondary | ICD-10-CM

## 2015-10-13 DIAGNOSIS — I10 Essential (primary) hypertension: Secondary | ICD-10-CM | POA: Diagnosis not present

## 2015-10-13 DIAGNOSIS — Z7982 Long term (current) use of aspirin: Secondary | ICD-10-CM

## 2015-10-13 DIAGNOSIS — Z0000 Encounter for general adult medical examination without abnormal findings: Secondary | ICD-10-CM

## 2015-10-13 DIAGNOSIS — Z8673 Personal history of transient ischemic attack (TIA), and cerebral infarction without residual deficits: Secondary | ICD-10-CM | POA: Diagnosis not present

## 2015-10-13 DIAGNOSIS — I639 Cerebral infarction, unspecified: Secondary | ICD-10-CM

## 2015-10-13 NOTE — Progress Notes (Signed)
Subjective:    Patient ID: Oneita JollyLura M Risk, female    DOB: 1940-08-03, 75 y.o.   MRN: [id number]  HPI Ms. Oneita JollyLura M Miyasato is a 75 year old woman with history of HTN, DJD, HLD, Vit D deficiency, diastolic dysfunction, osteoporosis presenting for follow up of CVA.  She was hospitalized 4/8 to 4/10. She has not received a 30 day cardiac event monitor.   Feelling well. Tingling in in left hand and left sided lips, still. Comes and goes. Similar to when diagnosed with CVA. Dizziness resolved. No problems with PO intake.  Review of Systems Constitutional: no fevers/chills Respiratory: no shortness of breath Cardiovascular: no chest pain Gastrointestinal: no nausea/vomiting, no abdominal pain, no diarrhea, no hematochezia Genitourinary: no dysuria, no hematuria Integument: no rash  Past Medical History  Diagnosis Date  . Hypertension   . Hyperlipemia   . DJD (degenerative joint disease)   . Chronic low back pain   . Abnormal electromyogram (EMG)     Low grade right S1 radiculopathy 7/93.  [name]. Hot flashes   . Rotator cuff syndrome of right shoulder     S/P right shoulder arthroscopic debridement of a massive rotator cuff tear, greater tuberosity and resection of subacromial spur by Dr. [name] 07/07/2005.  [name]. Herniated lumbar intervertebral disc     S/P L4-5 & L5-S1 laminotomy, foraminotomy, and L5-S1 diskectomy by Dr. [name] 11/01/2000.  . Multiple joint pain   . Iron deficiency anemia   . Vitamin D deficiency   . SVT (supraventricular tachycardia) (HCC)     PSVT  . Bilateral leg edema   . Peripheral neuropathy (HCC)   . Osteopenia     A DEXA bone density scan done 02/13/2009 showed a left femur neck young adult T-score of -1.6 (osteopenia), a right femur neck young adult T-score of -1.6 (osteopenia), and an AP spine young adult T-score of 1.3 (normal).  Her FRAX score gave an estimated 10 year probability of 6.6% for major osteoporotic fracture and 0.8% for hip fracture.  . Seasonal  allergic rhinitis   . Elevated alkaline phosphatase level     Bone scan 08/2004 showed no evidence of abnormal bony activity..  . Skin tag     Right axillary area.  . Hypokalemia   . Headache(784.0)   . Diastolic dysfunction     a. 08/2012 Echo: EF 60-65%, Gr 2 DD, triv MR, PASP 44mmHg.  . Chest pain     a. reports nl cath in the 1990's.  . Stroke (HCC) 04/2013  . Dysrhythmia     Current Outpatient Prescriptions on File Prior to Visit  Medication Sig Dispense Refill  . acetaminophen (TYLENOL) 500 MG tablet Take 1,000 mg by mouth every 6 (six) hours as needed for moderate pain.    [name]. alendronate (FOSAMAX) 70 MG tablet Take 1 tablet (70 mg total) by mouth every 7 (seven) days. Take with a full glass of water on an empty stomach. 12 tablet 3  . aspirin EC 81 MG tablet Take 1 tablet (81 mg total) by mouth daily. 30 tablet 2  . atenolol (TENORMIN) 50 MG tablet TAKE 1 TABLET (50 MG TOTAL) BY MOUTH DAILY. 31 tablet 5  . atorvastatin (LIPITOR) 80 MG tablet Take 1 tablet (80 mg total) by mouth daily at 6 PM. 30 tablet 2  . Calcium Carb-Cholecalciferol (CALCIUM-VITAMIN D3) 600-400 MG-UNIT TABS Take 1 tablet by mouth 2 (two) times daily. 60 tablet 5  . clopidogrel (PLAVIX) 75 MG tablet Take 1 tablet (75  mg total) by mouth daily. 30 tablet 5  . [facility] VITAMIN D 2000 units CAPS TAKE 1 CAPSULE EVERY DAY 30 capsule 2  . Cyanocobalamin (VITAMIN B-12) 500 MCG SUBL Place 1 tablet under the tongue daily.     . furosemide (LASIX) 20 MG tablet Take 3 tablets (60 mg total) by mouth daily. Take 2 tablets in the morning and 1 tablet at night 270 tablet 1  . KLOR-CON M20 20 MEQ tablet TAKE 1 TABLET (20 MEQ TOTAL) BY MOUTH DAILY. 30 tablet 2   No current facility-administered medications on file prior to visit.      Objective:   Physical Exam Blood pressure 119/67, pulse 72, temperature 98.1 F (36.7 C), temperature source Oral, weight 222 lb 11.2 oz (101.016 kg), SpO2 100 %. General Apperance: NAD HEENT:  Normocephalic, atraumatic, anicteric sclera Neck: Supple, trachea midline Lungs: Clear to auscultation bilaterally. No wheezes, rhonchi or rales. Breathing comfortably Heart: Regular rate and rhythm, no murmur/rub/gallop Abdomen: Soft, nontender, nondistended, no rebound/guarding Extremities: Warm and well perfused, no edema Skin: No rashes or lesions Neurologic: Alert and interactive. No gross deficits.    Assessment & Plan:  Please refer to problem based charting.

## 2015-10-13 NOTE — Patient Instructions (Addendum)
Continue to take your medications as prescribed Please go to the appointment to pick up your heart monitor Follow up with neurology as scheduled Follow up with the internal medicine clinic in 2-3 months, or sooner if needed

## 2015-10-14 NOTE — Assessment & Plan Note (Addendum)
Recovering well from her CVA. Working with PT.  Called and set up heart monitor - she has instructions to pick it up this week. Discontinued Aggrenox at her pharmacy Encouraged patient to keep follow up appointments with neurology Continue ASA and Plavix

## 2015-10-14 NOTE — Assessment & Plan Note (Signed)
Assessment: BP 119/67  Plan: Continue atenolol 50mg  daily, Lasix 60mg  daily.

## 2015-10-14 NOTE — Assessment & Plan Note (Signed)
Pt requesting referral for routine eye exam. Referral placed.

## 2015-10-14 NOTE — Progress Notes (Signed)
Case discussed with Dr. Isabella BowensKrall at the time of the visit. We reviewed the resident's history and exam and pertinent patient test results. I agree with the assessment, diagnosis, and plan of care documented in the resident's note.  The Aggrenox was an old medication that was apparently inadvertently refilled.  Patient is on dual antiplatelet therapy with ASA and Plavix.  This should be readdressed at follow-up as ASA + Aggrenox is as effective as Plavix alone and there are not many indications for the combination of Plavix and ASA although the decision from the inpatient service may have been this was an appropriate combination for this particular patient, hence why it was prescribed.

## 2015-10-16 ENCOUNTER — Ambulatory Visit (INDEPENDENT_AMBULATORY_CARE_PROVIDER_SITE_OTHER): Payer: Medicare Other

## 2015-10-16 DIAGNOSIS — I499 Cardiac arrhythmia, unspecified: Secondary | ICD-10-CM | POA: Diagnosis not present

## 2015-10-16 DIAGNOSIS — I639 Cerebral infarction, unspecified: Secondary | ICD-10-CM | POA: Insufficient documentation

## 2015-10-16 DIAGNOSIS — I4891 Unspecified atrial fibrillation: Secondary | ICD-10-CM | POA: Diagnosis not present

## 2015-11-07 ENCOUNTER — Other Ambulatory Visit: Payer: Self-pay

## 2015-11-07 ENCOUNTER — Other Ambulatory Visit: Payer: Self-pay | Admitting: *Deleted

## 2015-11-07 MED ORDER — ATORVASTATIN CALCIUM 80 MG PO TABS: 80.0000 mg | ORAL_TABLET | Freq: Every day | ORAL | Status: DC

## 2015-11-07 NOTE — Telephone Encounter (Signed)
Pt requesting atorvastatin to be filled @ [facility] on [address] for 90 days supply.

## 2015-11-07 NOTE — Telephone Encounter (Signed)
Request for refill in other open enc

## 2015-11-28 DIAGNOSIS — M545 Low back pain: Secondary | ICD-10-CM | POA: Diagnosis not present

## 2015-11-28 DIAGNOSIS — M4806 Spinal stenosis, lumbar region: Secondary | ICD-10-CM | POA: Diagnosis not present

## 2015-12-04 DIAGNOSIS — M545 Low back pain: Secondary | ICD-10-CM | POA: Diagnosis not present

## 2015-12-04 DIAGNOSIS — M4806 Spinal stenosis, lumbar region: Secondary | ICD-10-CM | POA: Diagnosis not present

## 2015-12-06 ENCOUNTER — Other Ambulatory Visit: Payer: Self-pay | Admitting: Internal Medicine

## 2015-12-08 ENCOUNTER — Telehealth: Payer: Self-pay

## 2015-12-08 NOTE — Telephone Encounter (Signed)
LFt vm for patient to call back about 48 hr holter monitor results

## 2015-12-08 NOTE — Telephone Encounter (Signed)
Rn call patient to let her know that the 48 hour holter monitor did not show any significant rhythm problems. Pt verbalized understanding.

## 2015-12-08 NOTE — Telephone Encounter (Signed)
-----   Message from Micki RileyPramod S [name], MD sent at 12/06/2015  5:11 PM EDT ----- Joneen RoachKindly inform patient that 48 hr holter monitor did not show significant rhytmn problems

## 2015-12-18 ENCOUNTER — Other Ambulatory Visit: Payer: Self-pay | Admitting: *Deleted

## 2015-12-18 MED ORDER — POTASSIUM CHLORIDE CRYS ER 20 MEQ PO TBCR: EXTENDED_RELEASE_TABLET | ORAL | Status: DC

## 2015-12-18 NOTE — Telephone Encounter (Signed)
Requesting 90 day supply. Thanks 

## 2015-12-22 ENCOUNTER — Ambulatory Visit (INDEPENDENT_AMBULATORY_CARE_PROVIDER_SITE_OTHER): Payer: Medicare Other | Admitting: Neurology

## 2015-12-22 ENCOUNTER — Encounter: Payer: Self-pay | Admitting: Neurology

## 2015-12-22 VITALS — BP 120/72 | HR 69 | Ht 67.0 in | Wt 216.2 lb

## 2015-12-22 DIAGNOSIS — T148 Other injury of unspecified body region: Secondary | ICD-10-CM | POA: Diagnosis not present

## 2015-12-22 DIAGNOSIS — R52 Pain, unspecified: Secondary | ICD-10-CM | POA: Diagnosis not present

## 2015-12-22 DIAGNOSIS — I6381 Other cerebral infarction due to occlusion or stenosis of small artery: Secondary | ICD-10-CM

## 2015-12-22 DIAGNOSIS — I639 Cerebral infarction, unspecified: Secondary | ICD-10-CM

## 2015-12-22 DIAGNOSIS — S52501A Unspecified fracture of the lower end of right radius, initial encounter for closed fracture: Secondary | ICD-10-CM

## 2015-12-22 HISTORY — DX: Unspecified fracture of the lower end of right radius, initial encounter for closed fracture: S52.501A

## 2015-12-22 NOTE — Progress Notes (Signed)
PATIENT: [name] DOB: 11-24-1940  REASON FOR VISIT: follow up- history of stroke HISTORY FROM: patient  HISTORY OF PRESENT ILLNESS: [name] is a 75 year old female with a history of cerebrovascular disease and stroke. She returns today for an evaluation. The patient's primary care provider manages her cholesterol and hypertension. Today her blood pressure is good-it is 120/71. The patient is on atorvastatin for her cholesterol. Her recent lipid panel in March showed that her LDL was 80. The patient denies any new neurological symptoms. Denies any strokelike symptoms. She continues on Plavix for stroke prevention. She will occasionally have numbness in the left hand- this has been ongoing. The patient had carotid Doppler studies in February 2016 that were unremarkable. The patient reports that she does have some trouble with the right knee she has been seen orthopedic who has recommended knee replacement. She returns today for an evaluation.  HISTORY 07/23/14 ([name]):[name] seen for first office followup visit following hospital admission on 05/29/13 for stroke. She presented with sudden onset of left-sided numbness. She actually had a similar presentation 3 weeks ago in November and that time she is admitted fibrillation for stroke but no acute infarct and bilateral old basal ganglia and thalamic lacunar infarcts are noted. During the current admission she was found to have an acute left thalamic and right pontine infarct. Etiology was found to be small vessel disease. MRI of the brain showed no large vessel occlusion. Transthoracic echo showed normal ejection fraction without context of embolism. Lipid profile showed total cholesterol 205, triglycerides 68, HDL 59 and LDL 132 mg percent. She was started on atorvastatin and Plavix. She still should done well since discharge he still had some intermittent left face and hand tingling and numbness from time to time which is  getting better but does not completely improve. She states her blood pressure is well controlled with a slightly elevated in office today. She is tolerating Plavix without bleeding or bruising as well as Lipitor without any side effects. She plans to go on diet, lose weight and exercise regularly. She has no new complaints today.   UPDATE 11/16/13 (LL): [name] returns to the office for stroke followup. She has been doing well, no new neurovascular symptoms. Her blood pressure is well controlled, it is 120/73 in the office today. She has an appt at her PCP in June to have blood work checked, will have lipids checked then. She is tolerating statin and Plavix well without any known side effects. Still having some intermittent hand tingling and numbness occasionally. No new complaints. Update 07/23/2014 : She returns for follow-up after last visit 7 months ago. She continues to do well without recurrent stroke or TIA symptoms. She at has some intermittent numbness in the left hand particularly when she is tired but it is not bothersome. She is tolerating Plavix well without significant bleeding, bruising or other side effects. She states she had lab work done in the primary care physician's office but I do not have the results for my review today. Her blood pressure is quite good and today it is 122/73 in office. She has some walking difficulties mainly related to her right knee pain. No other new neurological issues.  Update 12/22/2015 : She returns follow-up today after last visit on 01/22/15. She is admitted in April 2017 with episode of headache as well as left face lip and hand numbness and tingling. MRI scan of the brain showed tiny right parietal white  matter lacunar infarct. I have personally reviewed the images and MRI scan. MRA of the brain showed no large vessel stenosis and MRA of the neck show only minor atheromatous changes at left carotid bifurcation. Transthoracic echo showed normal ejection  fraction. She had subsequent outpatient 48-hour Holter monitor which showed no significant arrhythmias. LDL cholesterol is mildly elevated at 104. Patient was on Plavix and baby aspirin was added for 3 months and she is tolerating it well with minor bruising but no bleeding episodes. She remains on Lipitor 80 mg and is tolerating well with the muscle aches or pains. Blood pressure is well controlled and today it is 1-0/72. She still has some intermittent numbness on the left face, cheek and fingertips but this is not bothersome and she notices only when she is tired. She is more bothered by the right knee pain from arthritis she walks slowly with a cane. She is thinking about having knee replacement done but has not yet made up her mind. REVIEW OF SYSTEMS: Out of a complete 14 system review of symptoms, the patient complains only of the following symptoms, and all other reviewed systems are negative.: Numbness, knee pain, walking difficulty  ALLERGIES: Allergies  Allergen Reactions  . Sulfonamide Derivatives Shortness Of Breath  . Penicillins Swelling and Other (See Comments)    REACTION: Local swelling in right arm after injection.    HOME MEDICATIONS: Outpatient Prescriptions Prior to Visit  Medication Sig Dispense Refill  . acetaminophen (TYLENOL) 500 MG tablet Take 1,000 mg by mouth every 6 (six) hours as needed for moderate pain.    [name] alendronate (FOSAMAX) 70 MG tablet Take 1 tablet (70 mg total) by mouth every 7 (seven) days. Take with a full glass of water on an empty stomach. 12 tablet 3  . aspirin EC 81 MG tablet Take 1 tablet (81 mg total) by mouth daily. 30 tablet 2  . atenolol (TENORMIN) 50 MG tablet TAKE 1 TABLET (50 MG TOTAL) BY MOUTH DAILY. 31 tablet 5  . atorvastatin (LIPITOR) 80 MG tablet Take 1 tablet (80 mg total) by mouth daily at 6 PM. 90 tablet 2  . Calcium Carb-Cholecalciferol (CALCIUM-VITAMIN D3) 600-400 MG-UNIT TABS Take 1 tablet by mouth 2 (two) times daily. 60 tablet 5    . clopidogrel (PLAVIX) 75 MG tablet Take 1 tablet (75 mg total) by mouth daily. 30 tablet 5  . [facility]S VITAMIN D 2000 units CAPS TAKE 1 CAPSULE EVERY DAY 30 capsule 2  . Cyanocobalamin (VITAMIN B-12) 500 MCG SUBL Place 1 tablet under the tongue daily.     . furosemide (LASIX) 20 MG tablet Take 3 tablets (60 mg total) by mouth daily. Take 2 tablets in the morning and 1 tablet at night 270 tablet 1  . potassium chloride SA (KLOR-CON M20) 20 MEQ tablet TAKE 1 TABLET (20 MEQ TOTAL) BY MOUTH DAILY. 30 tablet 2   No facility-administered medications prior to visit.    PAST MEDICAL HISTORY: Past Medical History  Diagnosis Date  . Hypertension   . Hyperlipemia   . DJD (degenerative joint disease)   . Chronic low back pain   . Abnormal electromyogram (EMG)     Low grade right S1 radiculopathy 7/93.  [name] Hot flashes   . Rotator cuff syndrome of right shoulder     S/P right shoulder arthroscopic debridement of a massive rotator cuff tear, greater tuberosity and resection of subacromial spur by Dr. Gean Birchwood 07/07/2005.  [name] Herniated lumbar intervertebral disc  S/P L4-5 & L5-S1 laminotomy, foraminotomy, and L5-S1 diskectomy by Dr. [name] 11/01/2000.  . Multiple joint pain   . Iron deficiency anemia   . Vitamin D deficiency   . SVT (supraventricular tachycardia) (HCC)     PSVT  . Bilateral leg edema   . Peripheral neuropathy (HCC)   . Osteopenia     A DEXA bone density scan done 02/13/2009 showed a left femur neck young adult T-score of -1.6 (osteopenia), a right femur neck young adult T-score of -1.6 (osteopenia), and an AP spine young adult T-score of 1.3 (normal).  Her FRAX score gave an estimated 10 year probability of 6.6% for major osteoporotic fracture and 0.8% for hip fracture.  . Seasonal allergic rhinitis   . Elevated alkaline phosphatase level     Bone scan 08/2004 showed no evidence of abnormal bony activity..  . Skin tag     Right axillary area.  . Hypokalemia   . Headache(784.0)    . Diastolic dysfunction     a. 08/2012 Echo: EF 60-65%, Gr 2 DD, triv MR, PASP 44mmHg.  . Chest pain     a. reports nl cath in the 1990's.  . Stroke (HCC) 04/2013  . Dysrhythmia     PAST SURGICAL HISTORY: Past Surgical History  Procedure Laterality Date  . Total abdominal hysterectomy  1992  . Lumbar disc surgery  11/01/2000     L4-5, L5-S1 laminotomy, foraminotomy and L5-S1 diskectomy  . Shoulder arthroscopy  1/172007    S/P right shoulder arthroscopic debridement of a massive rotator cuff tear, greater tuberoplasty and resection of subacromial spur by Dr. [name] on 07/07/2005.  [name]. Cholecystectomy, laparoscopic  1998  . Cholecystectomy      FAMILY HISTORY: Family History  Problem Relation Age of Onset  . Ovarian cancer Mother 4170  . Hypertension Mother   . Heart attack Mother 1470  . Hypertension Sister   . Hypertension Sister   . Breast cancer Neg Hx   . Colon cancer Neg Hx   . Lung cancer Neg Hx   . Cancer Brother   . Neuropathy Sister     SOCIAL HISTORY: Social History   Social History  . Marital Status: Single    Spouse Name: N/A  . Number of Children: 6  . Years of Education: 121th   Occupational History  . retired    Social History Main Topics  . Smoking status: Former Smoker -- 0.15 packs/day for 2 years    Quit date: 10/07/1999  . Smokeless tobacco: Never Used  . Alcohol Use: No  . Drug Use: No  . Sexual Activity: Not on file   Other Topics Concern  . Not on file   Social History Narrative   Patient is single and lives at home alone.    Disabled.   Education high school.   Right handed.   Caffeine sometimes one cup of coffee.      PHYSICAL EXAM  Filed Vitals:   12/22/15 1448  BP: 120/72  Pulse: 69  Height: 5\' 7"  (1.702 m)  Weight: 216 lb 3.2 oz (98.068 kg)   Body mass index is 33.85 kg/(m^2).  Generalized: Well developed, in no acute distress   Neurological examination  Mentation: Alert oriented to time, place, history taking.  Follows all commands speech and language fluent Cranial nerve II-XII: Pupils were equal round reactive to light. Extraocular movements were full, visual field were full on confrontational test. Facial sensation and strength were normal. Uvula tongue midline.  Head turning and shoulder shrug  were normal and symmetric. Motor: The motor testing reveals 5 over 5 strength of all 4 extremities. Good symmetric motor tone is noted throughout.  Sensory: Sensory testing is intact to soft touch on all 4 extremities. No evidence of extinction is noted.  Coordination: Cerebellar testing reveals good finger-nose-finger and heel-to-shin bilaterally.  Gait and station: Gait is normal. Tandem gait is normal. Romberg is negative. No drift is seen.  Reflexes: Deep tendon reflexes are symmetric and normal bilaterally.   DIAGNOSTIC DATA (LABS, IMAGING, TESTING) - I reviewed patient records, labs, notes, testing and imaging myself where available.  Lab Results  Component Value Date   WBC 4.8 09/27/2015   HGB 12.6 09/27/2015   HCT 37.0 09/27/2015   MCV 85.4 09/27/2015   PLT 140* 09/27/2015      Component Value Date/Time   NA 140 09/28/2015 0515   K 3.5 09/28/2015 0515   CL 105 09/28/2015 0515   CO2 27 09/28/2015 0515   GLUCOSE 104* 09/28/2015 0515   BUN 7 09/28/2015 0515   CREATININE 0.49 09/28/2015 0515   CREATININE 0.66 08/22/2014 0925   CALCIUM 9.5 09/28/2015 0515   PROT 6.2* 09/27/2015 1746   ALBUMIN 3.6 09/27/2015 1746   AST 16 09/27/2015 1746   ALT 13* 09/27/2015 1746   ALKPHOS 101 09/27/2015 1746   BILITOT 1.1 09/27/2015 1746   GFRNONAA >60 09/28/2015 0515   GFRNONAA 88 08/22/2014 0925   GFRAA >60 09/28/2015 0515   GFRAA >89 08/22/2014 0925   Lab Results  Component Value Date   CHOL 166 09/28/2015   HDL 52 09/28/2015   LDLCALC 104* 09/28/2015   TRIG 50 09/28/2015   CHOLHDL 3.2 09/28/2015   Lab Results  Component Value Date   HGBA1C 5.3 09/28/2015   Lab Results  Component Value  Date   VITAMINB12 1149* 09/28/2015   Lab Results  Component Value Date   TSH 2.076 03/31/2010      ASSESSMENT AND PLAN 75 y.o. year old female  has a past medical history of Hypertension; Hyperlipemia; DJD (degenerative joint disease); Chronic low back pain; Abnormal electromyogram (EMG); Hot flashes; Rotator cuff syndrome of right shoulder; Herniated lumbar intervertebral disc; Multiple joint pain; Iron deficiency anemia; Vitamin D deficiency; SVT (supraventricular tachycardia) (HCC); Bilateral leg edema; Peripheral neuropathy (HCC); Osteopenia; Seasonal allergic rhinitis; Elevated alkaline phosphatase level; Skin tag; Hypokalemia; Headache(784.0); Diastolic dysfunction; Chest pain; Stroke [facility]) (04/2013); and Dysrhythmia. here with: First follow-up visit following recent admission for right pareital lacunar stroke in April 2017 from small vessel disease     I had a long d/w patient about her recent stroke, risk for recurrent stroke/TIAs, personally independently reviewed imaging studies and stroke evaluation results and answered questions.Continue clopidogrel 75 mg daily  alone and discontinue aspirin 81 mg daily in 2 weeks as I do not see definite benefit of long-term dual antiplatelet therapy for secondary stroke prevention and maintain strict control of hypertension with blood pressure goal below 130/90, diabetes with hemoglobin A1c goal below 6.5% and lipids with LDL cholesterol goal below 70 mg/dL. I also advised the patient to eat a healthy diet with plenty of whole grains, cereals, fruits and vegetables, exercise regularly and maintain ideal body weight Followup in the future with me in one year or call earlier if necessary.  Delia Heady  12/22/2015, Jolaine Click PM [facility] [address] [place], [place] [address] [phone]  Note: This document was prepared with digital dictation and possible smart  Lobbyistphrase technology. Any transcriptional errors that result from  this process are unintentional.

## 2015-12-22 NOTE — Patient Instructions (Signed)
I had a long d/w patient about her recent stroke, risk for recurrent stroke/TIAs, personally independently reviewed imaging studies and stroke evaluation results and answered questions.Continue clopidogrel 75 mg daily  alone and discontinue aspirin 81 mg daily in 2 weeks as I do not see definite benefit of long-term dual antiplatelet therapy for secondary stroke prevention and maintain strict control of hypertension with blood pressure goal below 130/90, diabetes with hemoglobin A1c goal below 6.5% and lipids with LDL cholesterol goal below 70 mg/dL. I also advised the patient to eat a healthy diet with plenty of whole grains, cereals, fruits and vegetables, exercise regularly and maintain ideal body weight Followup in the future with me in one year or call earlier if necessary.  Stroke Prevention Some medical conditions and behaviors are associated with an increased chance of having a stroke. You may prevent a stroke by making healthy choices and managing medical conditions. HOW CAN I REDUCE MY RISK OF HAVING A STROKE?   Stay physically active. Get at least 30 minutes of activity on most or all days.  Do not smoke. It may also be helpful to avoid exposure to secondhand smoke.  Limit alcohol use. Moderate alcohol use is considered to be:  No more than 2 drinks per day for men.  No more than 1 drink per day for nonpregnant women.  Eat healthy foods. This involves:  Eating 5 or more servings of fruits and vegetables a day.  Making dietary changes that address high blood pressure (hypertension), high cholesterol, diabetes, or obesity.  Manage your cholesterol levels.  Making food choices that are high in fiber and low in saturated fat, trans fat, and cholesterol may control cholesterol levels.  Take any prescribed medicines to control cholesterol as directed by your health care provider.  Manage your diabetes.  Controlling your carbohydrate and sugar intake is recommended to manage  diabetes.  Take any prescribed medicines to control diabetes as directed by your health care provider.  Control your hypertension.  Making food choices that are low in salt (sodium), saturated fat, trans fat, and cholesterol is recommended to manage hypertension.  Ask your health care provider if you need treatment to lower your blood pressure. Take any prescribed medicines to control hypertension as directed by your health care provider.  If you are 5618-74 years of age, have your blood pressure checked every 3-5 years. If you are 74 years of age or older, have your blood pressure checked every year.  Maintain a healthy weight.  Reducing calorie intake and making food choices that are low in sodium, saturated fat, trans fat, and cholesterol are recommended to manage weight.  Stop drug abuse.  Avoid taking birth control pills.  Talk to your health care provider about the risks of taking birth control pills if you are over 119 years old, smoke, get migraines, or have ever had a blood clot.  Get evaluated for sleep disorders (sleep apnea).  Talk to your health care provider about getting a sleep evaluation if you snore a lot or have excessive sleepiness.  Take medicines only as directed by your health care provider.  For some people, aspirin or blood thinners (anticoagulants) are helpful in reducing the risk of forming abnormal blood clots that can lead to stroke. If you have the irregular heart rhythm of atrial fibrillation, you should be on a blood thinner unless there is a good reason you cannot take them.  Understand all your medicine instructions.  Make sure that other conditions (such as  anemia or atherosclerosis) are addressed. SEEK IMMEDIATE MEDICAL CARE IF:   You have sudden weakness or numbness of the face, arm, or leg, especially on one side of the body.  Your face or eyelid droops to one side.  You have sudden confusion.  You have trouble speaking (aphasia) or  understanding.  You have sudden trouble seeing in one or both eyes.  You have sudden trouble walking.  You have dizziness.  You have a loss of balance or coordination.  You have a sudden, severe headache with no known cause.  You have new chest pain or an irregular heartbeat. Any of these symptoms may represent a serious problem that is an emergency. Do not wait to see if the symptoms will go away. Get medical help at once. Call your local emergency services (911 in [place] Do not drive yourself to the hospital.   This information is not intended to replace advice given to you by your health care provider. Make sure you discuss any questions you have with your health care provider.   Document Released: 07/15/2004 Document Revised: 06/28/2014 Document Reviewed: 12/08/2012 Elsevier Interactive Patient Education [employer].

## 2015-12-23 ENCOUNTER — Observation Stay (HOSPITAL_COMMUNITY)
Admission: EM | Admit: 2015-12-23 | Discharge: 2015-12-25 | Disposition: A | Discharge status: Home / Self Care | Payer: Medicare Other | Attending: Internal Medicine | Admitting: Internal Medicine

## 2015-12-23 ENCOUNTER — Encounter (HOSPITAL_COMMUNITY): Payer: Self-pay | Admitting: Emergency Medicine

## 2015-12-23 ENCOUNTER — Emergency Department (HOSPITAL_COMMUNITY): Payer: Medicare Other

## 2015-12-23 DIAGNOSIS — Y9389 Activity, other specified: Secondary | ICD-10-CM | POA: Diagnosis not present

## 2015-12-23 DIAGNOSIS — M80031A Age-related osteoporosis with current pathological fracture, right forearm, initial encounter for fracture: Secondary | ICD-10-CM | POA: Insufficient documentation

## 2015-12-23 DIAGNOSIS — E785 Hyperlipidemia, unspecified: Secondary | ICD-10-CM

## 2015-12-23 DIAGNOSIS — S20211A Contusion of right front wall of thorax, initial encounter: Secondary | ICD-10-CM | POA: Diagnosis not present

## 2015-12-23 DIAGNOSIS — Z8673 Personal history of transient ischemic attack (TIA), and cerebral infarction without residual deficits: Secondary | ICD-10-CM | POA: Insufficient documentation

## 2015-12-23 DIAGNOSIS — I679 Cerebrovascular disease, unspecified: Secondary | ICD-10-CM | POA: Diagnosis not present

## 2015-12-23 DIAGNOSIS — S5290XA Unspecified fracture of unspecified forearm, initial encounter for closed fracture: Secondary | ICD-10-CM | POA: Diagnosis present

## 2015-12-23 DIAGNOSIS — G8929 Other chronic pain: Secondary | ICD-10-CM | POA: Insufficient documentation

## 2015-12-23 DIAGNOSIS — S52501A Unspecified fracture of the lower end of right radius, initial encounter for closed fracture: Secondary | ICD-10-CM | POA: Diagnosis not present

## 2015-12-23 DIAGNOSIS — S79911A Unspecified injury of right hip, initial encounter: Secondary | ICD-10-CM | POA: Diagnosis not present

## 2015-12-23 DIAGNOSIS — R609 Edema, unspecified: Secondary | ICD-10-CM | POA: Diagnosis present

## 2015-12-23 DIAGNOSIS — M1990 Unspecified osteoarthritis, unspecified site: Secondary | ICD-10-CM | POA: Diagnosis present

## 2015-12-23 DIAGNOSIS — S0990XA Unspecified injury of head, initial encounter: Secondary | ICD-10-CM | POA: Diagnosis not present

## 2015-12-23 DIAGNOSIS — E559 Vitamin D deficiency, unspecified: Secondary | ICD-10-CM | POA: Diagnosis present

## 2015-12-23 DIAGNOSIS — W1830XA Fall on same level, unspecified, initial encounter: Secondary | ICD-10-CM | POA: Diagnosis not present

## 2015-12-23 DIAGNOSIS — Z7902 Long term (current) use of antithrombotics/antiplatelets: Secondary | ICD-10-CM | POA: Diagnosis not present

## 2015-12-23 DIAGNOSIS — M5136 Other intervertebral disc degeneration, lumbar region: Secondary | ICD-10-CM

## 2015-12-23 DIAGNOSIS — R600 Localized edema: Secondary | ICD-10-CM | POA: Diagnosis not present

## 2015-12-23 DIAGNOSIS — M25571 Pain in right ankle and joints of right foot: Secondary | ICD-10-CM | POA: Insufficient documentation

## 2015-12-23 DIAGNOSIS — S59291A Other physeal fracture of lower end of radius, right arm, initial encounter for closed fracture: Secondary | ICD-10-CM | POA: Diagnosis not present

## 2015-12-23 DIAGNOSIS — G629 Polyneuropathy, unspecified: Secondary | ICD-10-CM | POA: Insufficient documentation

## 2015-12-23 DIAGNOSIS — E876 Hypokalemia: Secondary | ICD-10-CM | POA: Diagnosis not present

## 2015-12-23 DIAGNOSIS — Z7982 Long term (current) use of aspirin: Secondary | ICD-10-CM | POA: Insufficient documentation

## 2015-12-23 DIAGNOSIS — M545 Low back pain: Secondary | ICD-10-CM | POA: Diagnosis not present

## 2015-12-23 DIAGNOSIS — Z9181 History of falling: Secondary | ICD-10-CM

## 2015-12-23 DIAGNOSIS — M25551 Pain in right hip: Secondary | ICD-10-CM | POA: Insufficient documentation

## 2015-12-23 DIAGNOSIS — W19XXXA Unspecified fall, initial encounter: Secondary | ICD-10-CM | POA: Diagnosis not present

## 2015-12-23 DIAGNOSIS — M7989 Other specified soft tissue disorders: Secondary | ICD-10-CM | POA: Diagnosis not present

## 2015-12-23 DIAGNOSIS — S6991XA Unspecified injury of right wrist, hand and finger(s), initial encounter: Secondary | ICD-10-CM | POA: Diagnosis present

## 2015-12-23 DIAGNOSIS — Z87891 Personal history of nicotine dependence: Secondary | ICD-10-CM | POA: Insufficient documentation

## 2015-12-23 DIAGNOSIS — S199XXA Unspecified injury of neck, initial encounter: Secondary | ICD-10-CM | POA: Diagnosis not present

## 2015-12-23 DIAGNOSIS — R2681 Unsteadiness on feet: Secondary | ICD-10-CM | POA: Diagnosis not present

## 2015-12-23 DIAGNOSIS — S299XXA Unspecified injury of thorax, initial encounter: Secondary | ICD-10-CM | POA: Diagnosis not present

## 2015-12-23 DIAGNOSIS — I10 Essential (primary) hypertension: Secondary | ICD-10-CM | POA: Diagnosis not present

## 2015-12-23 DIAGNOSIS — S5291XA Unspecified fracture of right forearm, initial encounter for closed fracture: Secondary | ICD-10-CM

## 2015-12-23 DIAGNOSIS — M542 Cervicalgia: Secondary | ICD-10-CM | POA: Diagnosis not present

## 2015-12-23 DIAGNOSIS — R0781 Pleurodynia: Secondary | ICD-10-CM | POA: Diagnosis not present

## 2015-12-23 DIAGNOSIS — M810 Age-related osteoporosis without current pathological fracture: Secondary | ICD-10-CM | POA: Diagnosis present

## 2015-12-23 DIAGNOSIS — Z79899 Other long term (current) drug therapy: Secondary | ICD-10-CM | POA: Diagnosis not present

## 2015-12-23 HISTORY — DX: Unspecified fracture of the lower end of right radius, initial encounter for closed fracture: S52.501A

## 2015-12-23 LAB — CBC
HCT: 35.7 % — ABNORMAL LOW (ref 36.0–46.0)
Hemoglobin: 11.8 g/dL — ABNORMAL LOW (ref 12.0–15.0)
MCH: 27.5 pg (ref 26.0–34.0)
MCHC: 33.1 g/dL (ref 30.0–36.0)
MCV: 83.2 fL (ref 78.0–100.0)
PLATELETS: 120 10*3/uL — AB (ref 150–400)
RBC: 4.29 MIL/uL (ref 3.87–5.11)
RDW: 13 % (ref 11.5–15.5)
WBC: 6.3 10*3/uL (ref 4.0–10.5)

## 2015-12-23 LAB — BASIC METABOLIC PANEL
Anion gap: 8 (ref 5–15)
BUN: 17 mg/dL (ref 6–20)
CO2: 25 mmol/L (ref 22–32)
Calcium: 9.4 mg/dL (ref 8.9–10.3)
Chloride: 108 mmol/L (ref 101–111)
Creatinine, Ser: 0.69 mg/dL (ref 0.44–1.00)
GFR calc Af Amer: >60 mL/min (ref >60)
GLUCOSE: 155 mg/dL — AB (ref 65–99)
POTASSIUM: 3.7 mmol/L (ref 3.5–5.1)
Sodium: 141 mmol/L (ref 135–145)

## 2015-12-23 MED ORDER — TRAMADOL HCL 50 MG PO TABS
50.0000 mg | ORAL_TABLET | Freq: Four times a day (QID) | ORAL | Status: DC | PRN
  Administered 2015-12-23 – 2015-12-25 (×9): 50 mg via ORAL
  Filled 2015-12-23 (×9): qty 1

## 2015-12-23 MED ORDER — SENNOSIDES-DOCUSATE SODIUM 8.6-50 MG PO TABS
1.0000 | ORAL_TABLET | Freq: Every evening | ORAL | Status: DC | PRN
  Administered 2015-12-24: 1 via ORAL
  Filled 2015-12-23: qty 1

## 2015-12-23 MED ORDER — FUROSEMIDE 20 MG PO TABS
20.0000 mg | ORAL_TABLET | Freq: Every day | ORAL | Status: DC
  Administered 2015-12-23 – 2015-12-24 (×2): 20 mg via ORAL
  Filled 2015-12-23 (×3): qty 1

## 2015-12-23 MED ORDER — DOCUSATE SODIUM 100 MG PO CAPS
100.0000 mg | ORAL_CAPSULE | Freq: Two times a day (BID) | ORAL | Status: DC
  Administered 2015-12-23 – 2015-12-25 (×5): 100 mg via ORAL
  Filled 2015-12-23 (×5): qty 1

## 2015-12-23 MED ORDER — MORPHINE SULFATE (PF) 2 MG/ML IV SOLN
2.0000 mg | Freq: Once | INTRAVENOUS | Status: AC
Start: 2015-12-23 — End: 2015-12-23
  Administered 2015-12-23: 2 mg via INTRAVENOUS
  Filled 2015-12-23: qty 1

## 2015-12-23 MED ORDER — KETOROLAC TROMETHAMINE 15 MG/ML IJ SOLN
15.0000 mg | Freq: Four times a day (QID) | INTRAMUSCULAR | Status: DC
  Administered 2015-12-23 – 2015-12-24 (×4): 15 mg via INTRAVENOUS
  Filled 2015-12-23 (×4): qty 1

## 2015-12-23 MED ORDER — ATENOLOL 50 MG PO TABS
50.0000 mg | ORAL_TABLET | Freq: Every day | ORAL | Status: DC
  Administered 2015-12-23 – 2015-12-25 (×3): 50 mg via ORAL
  Filled 2015-12-23 (×3): qty 1

## 2015-12-23 MED ORDER — FUROSEMIDE 40 MG PO TABS
40.0000 mg | ORAL_TABLET | ORAL | Status: AC
  Administered 2015-12-24: 40 mg via ORAL
  Filled 2015-12-23: qty 1

## 2015-12-23 MED ORDER — ASPIRIN EC 81 MG PO TBEC
81.0000 mg | DELAYED_RELEASE_TABLET | Freq: Every day | ORAL | Status: DC
  Administered 2015-12-23 – 2015-12-25 (×3): 81 mg via ORAL
  Filled 2015-12-23 (×3): qty 1

## 2015-12-23 MED ORDER — ONDANSETRON HCL 4 MG/2ML IJ SOLN
4.0000 mg | Freq: Once | INTRAMUSCULAR | Status: AC
  Administered 2015-12-23: 4 mg via INTRAVENOUS
  Filled 2015-12-23: qty 2

## 2015-12-23 MED ORDER — MORPHINE SULFATE (PF) 2 MG/ML IV SOLN
2.0000 mg | Freq: Once | INTRAVENOUS | Status: AC
  Administered 2015-12-23: 2 mg via INTRAVENOUS
  Filled 2015-12-23: qty 1

## 2015-12-23 MED ORDER — CLOPIDOGREL BISULFATE 75 MG PO TABS
75.0000 mg | ORAL_TABLET | Freq: Every day | ORAL | Status: DC
  Administered 2015-12-23 – 2015-12-25 (×3): 75 mg via ORAL
  Filled 2015-12-23 (×3): qty 1

## 2015-12-23 MED ORDER — ACETAMINOPHEN 500 MG PO TABS: 500.0000 mg | ORAL_TABLET | Freq: Four times a day (QID) | ORAL | Status: DC | PRN

## 2015-12-23 MED ORDER — ATORVASTATIN CALCIUM 80 MG PO TABS
80.0000 mg | ORAL_TABLET | Freq: Every day | ORAL | Status: DC
  Administered 2015-12-23 – 2015-12-25 (×3): 80 mg via ORAL
  Filled 2015-12-23 (×3): qty 1

## 2015-12-23 MED ORDER — ENOXAPARIN SODIUM 40 MG/0.4ML ~~LOC~~ SOLN
40.0000 mg | SUBCUTANEOUS | Status: DC
  Administered 2015-12-23 – 2015-12-25 (×3): 40 mg via SUBCUTANEOUS
  Filled 2015-12-23 (×3): qty 0.4

## 2015-12-23 NOTE — ED Notes (Signed)
Patient unable to ambulate. Very sore and stiff when rotating to get up off the bed. Patient is able to stand with 2 person assist but unable to put any weight on right leg. Patient is only able to stand for a few minutes before having to sit back down.

## 2015-12-23 NOTE — Care Management Note (Signed)
Case Management Note  Patient Details  Name: Oneita JollyLura M Petrelli MRN: [id number] Date of Birth: [birth year]  Subjective/Objective:                  75 y.o. female brought in by ambulance, who presents to the Emergency Department complaining of sudden onset, constant, right wrist pain s/p ground level fall that occurred earlier tonight. Pt mentions that she was taking in groceries when a cantaloupe knocked her cane out from underneath her causing her to fall./ From home alone.  Action/Plan: Follow for disposition needs. /Anticipate home health needs.   Expected Discharge Date:  12/25/15               Expected Discharge Plan:  Home w Home Health Services  In-House Referral:     Discharge planning Services  CM Consult  Post Acute Care Choice:    Choice offered to:     DME Arranged:    DME Agency:     HH Arranged:    HH Agency:     Status of Service:  In process, will continue to follow  If discussed at Long Length of Stay Meetings, dates discussed:    Additional Comments:  Oletta CohnWood, Asiana Benninger, RN 12/23/2015, 8:24 AM

## 2015-12-23 NOTE — ED Notes (Signed)
Dr.Pollina at bedside and removed cervical collar. Pt to be medicated for pain then ambulated

## 2015-12-23 NOTE — ED Notes (Signed)
Pt assisted from bedside commode to bed and pericare performed.

## 2015-12-23 NOTE — ED Notes (Signed)
MD at bedside. 

## 2015-12-23 NOTE — ED Provider Notes (Signed)
CSN: [id number]     Arrival date & time 12/23/15  0032 History  By signing my name below, I, Tanda Rockers, attest that this documentation has been prepared under the direction and in the presence of Gilda Crease, MD. Electronically Signed: Tanda Rockers, ED Scribe. 12/23/2015. 12:52 AM.   Chief Complaint  Patient presents with  . Fall  . Wrist Injury   The history is provided by the patient. No language interpreter was used.    HPI Comments: [name] is a 75 y.o. female brought in by ambulance, who presents to the Emergency Department complaining of sudden onset, constant, right wrist pain s/p ground level fall that occurred earlier tonight. Pt mentions that she was taking in groceries when a cantaloupe knocked her cane out from underneath her causing her to fall. She also complains of right ankle pain, right hip pain, mild neck pain, central chest pain, and pain underneath her right breast. No head injury or LOC. Denies any other associated symptoms. Pt is in C-collar at time of exam.   Past Medical History  Diagnosis Date  . Hypertension   . Hyperlipemia   . DJD (degenerative joint disease)   . Chronic low back pain   . Abnormal electromyogram (EMG)     Low grade right S1 radiculopathy 7/93.  [name] Hot flashes   . Rotator cuff syndrome of right shoulder     S/P right shoulder arthroscopic debridement of a massive rotator cuff tear, greater tuberosity and resection of subacromial spur by Dr. Gean Birchwood 07/07/2005.  [name] Herniated lumbar intervertebral disc     S/P L4-5 & L5-S1 laminotomy, foraminotomy, and L5-S1 diskectomy by Dr. Trey Sailors 11/01/2000.  . Multiple joint pain   . Iron deficiency anemia   . Vitamin D deficiency   . SVT (supraventricular tachycardia) (HCC)     PSVT  . Bilateral leg edema   . Peripheral neuropathy (HCC)   . Osteopenia     A DEXA bone density scan done 02/13/2009 showed a left femur neck young adult T-score of -1.6 (osteopenia), a right femur  neck young adult T-score of -1.6 (osteopenia), and an AP spine young adult T-score of 1.3 (normal).  Her FRAX score gave an estimated 10 year probability of 6.6% for major osteoporotic fracture and 0.8% for hip fracture.  . Seasonal allergic rhinitis   . Elevated alkaline phosphatase level     Bone scan 08/2004 showed no evidence of abnormal bony activity..  . Skin tag     Right axillary area.  . Hypokalemia   . Headache(784.0)   . Diastolic dysfunction     a. 08/2012 Echo: EF 60-65%, Gr 2 DD, triv MR, PASP .  . Chest pain     a. reports nl cath in the 1990's.  . Stroke (HCC) 04/2013  . Dysrhythmia    Past Surgical History  Procedure Laterality Date  . Total abdominal hysterectomy  1992  . Lumbar disc surgery  11/01/2000     L4-5, L5-S1 laminotomy, foraminotomy and L5-S1 diskectomy  . Shoulder arthroscopy  1/172007    S/P right shoulder arthroscopic debridement of a massive rotator cuff tear, greater tuberoplasty and resection of subacromial spur by Dr. Gean Birchwood on 07/07/2005.  [name] Cholecystectomy, laparoscopic  1998  . Cholecystectomy     Family History  Problem Relation Age of Onset  . Ovarian cancer Mother 46  . Hypertension Mother   . Heart attack Mother 74  . Hypertension Sister   . Hypertension  Sister   . Breast cancer Neg Hx   . Colon cancer Neg Hx   . Lung cancer Neg Hx   . Cancer Brother   . Neuropathy Sister    Social History  Substance Use Topics  . Smoking status: Former Smoker -- 0.15 packs/day for 2 years    Quit date: 10/07/1999  . Smokeless tobacco: Never Used  . Alcohol Use: No   OB History    No data available     Review of Systems  Cardiovascular: Positive for chest pain.  Musculoskeletal: Positive for arthralgias and neck pain.  Neurological: Negative for syncope.  All other systems reviewed and are negative.  Allergies  Sulfonamide derivatives and Penicillins  Home Medications   Prior to Admission medications   Medication Sig Start  Date End Date Taking? Authorizing Provider  aspirin EC 81 MG tablet Take 1 tablet (81 mg total) by mouth daily. 09/29/15  Yes John GiovanniVasundhra Rathore, MD  atenolol (TENORMIN) 50 MG tablet TAKE 1 TABLET (50 MG TOTAL) BY MOUTH DAILY. 09/07/15  Yes Tyson Aliasuncan Thomas Vincent, MD  atorvastatin (LIPITOR) 80 MG tablet Take 1 tablet (80 mg total) by mouth daily at 6 PM. 11/07/15  Yes Tyson Aliasuncan Thomas Vincent, MD  clopidogrel (PLAVIX) 75 MG tablet Take 1 tablet (75 mg total) by mouth daily. 05/13/15  Yes Tyson Aliasuncan Thomas Vincent, MD  [facility] VITAMIN D 2000 units CAPS TAKE 1 CAPSULE EVERY DAY 10/06/15  Yes Doneen PoissonLawrence Klima, MD  furosemide (LASIX) 20 MG tablet Take 3 tablets (60 mg total) by mouth daily. Take 2 tablets in the morning and 1 tablet at night Patient taking differently: Take 20-40 mg by mouth 2 (two) times daily. Take 2 tablets in the morning and 1 tablet at night 11/26/14  Yes Stark BrayJulia E Mallory, MD  potassium chloride SA (KLOR-CON M20) 20 MEQ tablet TAKE 1 TABLET (20 MEQ TOTAL) BY MOUTH DAILY. 12/18/15  Yes Tyson Aliasuncan Thomas Vincent, MD  traMADol (ULTRAM) 50 MG tablet Take 50 mg by mouth every 6 (six) hours as needed for moderate pain.  12/16/15  Yes Historical Provider, MD  alendronate (FOSAMAX) 70 MG tablet Take 1 tablet (70 mg total) by mouth every 7 (seven) days. Take with a full glass of water on an empty stomach. Patient not taking: Reported on 12/23/2015 05/05/15   Tyson Aliasuncan Thomas Vincent, MD  Calcium Carb-Cholecalciferol (CALCIUM-VITAMIN D3) 600-400 MG-UNIT TABS Take 1 tablet by mouth 2 (two) times daily. 08/07/14   Margarito LinerJerry Joines, MD   BP 157/91 mmHg  Pulse 87  Temp(Src) 98.1 F (36.7 C)  Resp 16  SpO2 96%  LMP  (LMP Unknown) Physical Exam  Constitutional: She is oriented to person, place, and time. She appears well-developed and well-nourished. No distress.  HENT:  Head: Normocephalic and atraumatic.  Right Ear: Hearing normal.  Left Ear: Hearing normal.  Nose: Nose normal.  Mouth/Throat: Oropharynx is clear and moist  and mucous membranes are normal.  Eyes: Conjunctivae and EOM are normal. Pupils are equal, round, and reactive to light.  Neck: Normal range of motion. Neck supple.  Cardiovascular: Regular rhythm, S1 normal and S2 normal.  Exam reveals no gallop and no friction rub.   No murmur heard. Pulmonary/Chest: Effort normal and breath sounds normal. No respiratory distress. She exhibits tenderness.  Right lateral chest wall tenderness.   Abdominal: Soft. Normal appearance and bowel sounds are normal. There is no hepatosplenomegaly. There is no tenderness. There is no rebound, no guarding, no tenderness at McBurney's point and negative Murphy's sign.  No hernia.  Musculoskeletal: Normal range of motion. She exhibits edema and tenderness.  Right ankle tenderness without deformity.  Tenderness and swelling to right wrist.  Diffuse neck tenderness.  Painful ROM of bialteral hips without obvious deformity.   Neurological: She is alert and oriented to person, place, and time. She has normal strength. No cranial nerve deficit or sensory deficit. Coordination normal. GCS eye subscore is 4. GCS verbal subscore is 5. GCS motor subscore is 6.  Skin: Skin is warm, dry and intact. No rash noted. No cyanosis.  Psychiatric: She has a normal mood and affect. Her speech is normal and behavior is normal. Thought content normal.  Nursing note and vitals reviewed.   ED Course  Procedures (including critical care time)  DIAGNOSTIC STUDIES: Oxygen Saturation is 98% on RA, normal by my interpretation.    COORDINATION OF CARE: 12:51 AM-Discussed treatment plan which includes DG R Wrist, DG Hips, DG R Ankle, CT Head, CT C Spine, and DG R RIbs with pt at bedside and pt agreed to plan.   Labs Review Labs Reviewed  CBC - Abnormal; Notable for the following:    Hemoglobin 11.8 (*)    HCT 35.7 (*)    Platelets 120 (*)    All other components within normal limits  BASIC METABOLIC PANEL    Imaging Review Dg Ribs  Unilateral W/chest Right  12/23/2015  CLINICAL DATA:  Status post fall, with right-sided rib pain. Initial encounter. EXAM: RIGHT RIBS AND CHEST - 3+ VIEW COMPARISON:  Chest radiograph performed 07/28/2013 FINDINGS: No displaced rib fractures are seen. The lungs are well-aerated. Mild vascular congestion is noted. There is no evidence of focal opacification, pleural effusion or pneumothorax. The cardiomediastinal silhouette is borderline normal in size. No acute osseous abnormalities are seen. Clips are noted within the right upper quadrant, reflecting prior cholecystectomy. IMPRESSION: 1. No displaced rib fracture seen. 2. Mild vascular congestion noted.  Lungs remain grossly clear. Electronically Signed   By: Roanna Raider M.D.   On: 12/23/2015 01:39   Dg Wrist Complete Right  12/23/2015  CLINICAL DATA:  Status post fall, with right wrist pain. Initial encounter. EXAM: RIGHT WRIST - COMPLETE 3+ VIEW COMPARISON:  None. FINDINGS: There appears to be a minimally displaced fracture involving the distal radial metaphysis, extending to the radiocarpal joint. The carpal rows are intact, and demonstrate normal alignment. The joint spaces are preserved. Diffuse soft tissue swelling is noted about the wrist. IMPRESSION: Minimally displaced fracture involving the distal radial metaphysis, extending to the radiocarpal joint. Electronically Signed   By: Roanna Raider M.D.   On: 12/23/2015 01:40   Dg Ankle Complete Right  12/23/2015  CLINICAL DATA:  Status post fall, with right ankle pain. Initial encounter. EXAM: RIGHT ANKLE - COMPLETE 3+ VIEW COMPARISON:  None. FINDINGS: There is no evidence of fracture or dislocation. The ankle mortise is intact; the interosseous space is within normal limits. No talar tilt or subluxation is seen. The joint spaces are preserved. Diffuse soft tissue swelling is noted about the ankle. IMPRESSION: No evidence of fracture or dislocation. Diffuse soft tissue swelling noted about the ankle.  Electronically Signed   By: Roanna Raider M.D.   On: 12/23/2015 01:42   Ct Head Wo Contrast  12/23/2015  CLINICAL DATA:  Fall while carrying groceries. No loss of consciousness. Neck pain. EXAM: CT HEAD WITHOUT CONTRAST CT CERVICAL SPINE WITHOUT CONTRAST TECHNIQUE: Multidetector CT imaging of the head and cervical spine was performed following the standard protocol  without intravenous contrast. Multiplanar CT image reconstructions of the cervical spine were also generated. COMPARISON:  Head CT and brain MRI 09/27/2015 FINDINGS: CT HEAD FINDINGS No intracranial hemorrhage, mass effect, or midline shift. Stable atrophy and chronic small vessel ischemia. Tiny focus of encephalomalacia at site of right parietal infarct on MRI. No hydrocephalus. The basilar cisterns are patent. No evidence of territorial infarct. No intracranial fluid collection. Atherosclerosis of skullbase vasculature. Calvarium is intact. Included paranasal sinuses and mastoid air cells are well aerated. CT CERVICAL SPINE FINDINGS No fracture or acute subluxation. The dens is intact. There are no jumped or perched facets. Disc space narrowing and endplate spurring most prominent at C4-C5, C5-C6, C6-C7. Scattered facet arthropathy. No prevertebral soft tissue edema. IMPRESSION: 1. No acute intracranial abnormality. Stable atrophy and chronic small vessel ischemia. 2. Degenerative change in the cervical spine without acute fracture or subluxation. Electronically Signed   By: Rubye Oaks M.D.   On: 12/23/2015 02:15   Ct Cervical Spine Wo Contrast  12/23/2015  CLINICAL DATA:  Fall while carrying groceries. No loss of consciousness. Neck pain. EXAM: CT HEAD WITHOUT CONTRAST CT CERVICAL SPINE WITHOUT CONTRAST TECHNIQUE: Multidetector CT imaging of the head and cervical spine was performed following the standard protocol without intravenous contrast. Multiplanar CT image reconstructions of the cervical spine were also generated. COMPARISON:  Head  CT and brain MRI 09/27/2015 FINDINGS: CT HEAD FINDINGS No intracranial hemorrhage, mass effect, or midline shift. Stable atrophy and chronic small vessel ischemia. Tiny focus of encephalomalacia at site of right parietal infarct on MRI. No hydrocephalus. The basilar cisterns are patent. No evidence of territorial infarct. No intracranial fluid collection. Atherosclerosis of skullbase vasculature. Calvarium is intact. Included paranasal sinuses and mastoid air cells are well aerated. CT CERVICAL SPINE FINDINGS No fracture or acute subluxation. The dens is intact. There are no jumped or perched facets. Disc space narrowing and endplate spurring most prominent at C4-C5, C5-C6, C6-C7. Scattered facet arthropathy. No prevertebral soft tissue edema. IMPRESSION: 1. No acute intracranial abnormality. Stable atrophy and chronic small vessel ischemia. 2. Degenerative change in the cervical spine without acute fracture or subluxation. Electronically Signed   By: Rubye Oaks M.D.   On: 12/23/2015 02:15   Dg Hips Bilat With Pelvis 3-4 Views  12/23/2015  CLINICAL DATA:  Status post fall, with right hip pain. Initial encounter. EXAM: DG HIP (WITH OR WITHOUT PELVIS) 3-4V BILAT COMPARISON:  None. FINDINGS: There is no evidence of fracture or dislocation. Both femoral heads are seated normally within their respective acetabula. The proximal femurs appear intact bilaterally. No significant degenerative change is appreciated. The sacroiliac joints are unremarkable in appearance. The visualized bowel gas pattern is grossly unremarkable in appearance. IMPRESSION: No evidence of fracture or dislocation. Electronically Signed   By: Roanna Raider M.D.   On: 12/23/2015 01:41   I have personally reviewed and evaluated these images and lab results as part of my medical decision-making.   EKG Interpretation None      MDM   Final diagnoses:  Radius fracture, right, closed, initial encounter  Chest wall contusion, right,  initial encounter   Patient presents to the emergency department for evaluation after a fall. Patient reports that she was trying to carry her groceries in and the grocery bag hit her cane, causing her to fall. She fell onto her right side. She was complaining primarily of right wrist pain, but also had complaints of right chest wall pain and right hip pain as well as right  ankle pain.  Patient underwent CT head and cervical spine. She did have some neck tenderness and is on Plavix. CT scans did not show any acute abnormality.Patient had x-ray of bilateral hips, right ankle, right ribs. No acute abnormality noted. X-ray of right wrist revealed distal radius fracture. Findings were discussed with Dr. Ophelia CharterYates, on-call for hand surgery. He indicates that there is no need for acute intervention. Patient would be appropriate for follow-up in his office on Friday for further treatment. Recommend well-padded splint for comfort. This was discussed with the family. Additionally, patient has been seen by Dr. Turner Danielsowan in the past. She will have the option of following up with Dr. Ophelia CharterYates or Dr. Turner Danielsowan as an outpatient.  Patient having significant pain in the area of the right ribs with any movement. She is unable to get up and ambulate secondary to the pain. She will therefore require hospitalization for pain control.  I personally performed the services described in this documentation, which was scribed in my presence. The recorded information has been reviewed and is accurate.     Gilda Creasehristopher J Eartha Vonbehren, MD 12/23/15 [phone]

## 2015-12-23 NOTE — H&P (Signed)
Date: 12/23/2015               Patient Name:  [name] MRN: [id number]  DOB: 20-Jan-1941 Age / Sex: 75 y.o., female   PCP: Tyson Aliasuncan Thomas Vincent, MD         Medical Service: Internal Medicine Teaching Service         Attending Physician: Dr. Burns SpainElizabeth A Butcher, MD    First Contact: Dr. Sharene SkeansAdam G Militza Devery, MD Pager: [phone]  Second Contact: Dr. Griffin BasilJennifer Krall, MD Pager: [phone]       After Hours (After 5p/  First Contact Pager: [phone]  weekends / holidays): Second Contact Pager: [phone]   Chief Complaint: Right wrist pain  History of Present Illness: [name] is a pleasant 75 year old lady with PMH significant for osteopenia, chronic low back pain, DJD, peripheral neuropathy, CVA in 2014 who [facility] with severe right wrist pain following a fall from standing outside her home. The patient states that she was removing her groceries from her car, including a heavy cantaloupe in a plastic bag. At one point the bag swung into her cane and caused her to fall on her right side. Her right wrist took most of the weight of the fall, along with her right chest, hip, and shoulder. She denies injuring her head or losing consciousness. She denies feeling dizzy or strange before or after her fall. She states that her right shoulder and ribs ache but denies deep chest pain or tightness.    [facility] she was HDS with normal CBC, BMP, CT head and c-spine were normal and her c-collar was removed. XR of the right wrist demonstrated a minimally-displaced fracture of the distal radial metaphysis extending to the radiocarpal joint space. XR of the hips, right ankle, and ribs showed no fractures. She received 2 doses of IV morphine 2mg  for her 10/10 pain.  Meds: Medications Prior to Admission  Medication Sig Dispense Refill  . aspirin EC 81 MG tablet Take 1 tablet (81 mg total) by mouth daily. 30 tablet 2  . atenolol (TENORMIN) 50 MG tablet TAKE 1 TABLET (50 MG TOTAL)  BY MOUTH DAILY. 31 tablet 5  . atorvastatin (LIPITOR) 80 MG tablet Take 1 tablet (80 mg total) by mouth daily at 6 PM. 90 tablet 2  . clopidogrel (PLAVIX) 75 MG tablet Take 1 tablet (75 mg total) by mouth daily. 30 tablet 5  . CVS VITAMIN D 2000 units CAPS TAKE 1 CAPSULE EVERY DAY 30 capsule 2  . furosemide (LASIX) 20 MG tablet Take 3 tablets (60 mg total) by mouth daily. Take 2 tablets in the morning and 1 tablet at night (Patient taking differently: Take 20-40 mg by mouth 2 (two) times daily. Take 2 tablets in the morning and 1 tablet at night) 270 tablet 1  . potassium chloride SA (KLOR-CON M20) 20 MEQ tablet TAKE 1 TABLET (20 MEQ TOTAL) BY MOUTH DAILY. 30 tablet 2  . traMADol (ULTRAM) 50 MG tablet Take 50 mg by mouth every 6 (six) hours as needed for moderate pain.     [name]. alendronate (FOSAMAX) 70 MG tablet Take 1 tablet (70 mg total) by mouth every 7 (seven) days. Take with a full glass of water on an empty stomach. (Patient not taking: Reported on 12/23/2015) 12 tablet 3  . Calcium Carb-Cholecalciferol (CALCIUM-VITAMIN D3) 600-400 MG-UNIT TABS Take 1 tablet by mouth 2 (two) times daily. 60 tablet 5  Allergies: Allergies as of 12/23/2015 - Review Complete 12/23/2015  Allergen Reaction Noted  . Sulfonamide derivatives Shortness Of Breath   . Penicillins Swelling and Other (See Comments)    Past Medical History  Diagnosis Date  . Hypertension   . Hyperlipemia   . DJD (degenerative joint disease)   . Chronic low back pain   . Abnormal electromyogram (EMG)     Low grade right S1 radiculopathy 7/93.  [name] Hot flashes   . Rotator cuff syndrome of right shoulder     S/P right shoulder arthroscopic debridement of a massive rotator cuff tear, greater tuberosity and resection of subacromial spur by Dr. Gean Birchwood 07/07/2005.  [name] Herniated lumbar intervertebral disc     S/P L4-5 & L5-S1 laminotomy, foraminotomy, and L5-S1 diskectomy by Dr. Trey Sailors 11/01/2000.  . Multiple joint pain   . Iron  deficiency anemia   . Vitamin D deficiency   . SVT (supraventricular tachycardia) (HCC)     PSVT  . Bilateral leg edema   . Peripheral neuropathy (HCC)   . Osteopenia     A DEXA bone density scan done 02/13/2009 showed a left femur neck young adult T-score of -1.6 (osteopenia), a right femur neck young adult T-score of -1.6 (osteopenia), and an AP spine young adult T-score of 1.3 (normal).  Her FRAX score gave an estimated 10 year probability of 6.6% for major osteoporotic fracture and 0.8% for hip fracture.  . Seasonal allergic rhinitis   . Elevated alkaline phosphatase level     Bone scan 08/2004 showed no evidence of abnormal bony activity..  . Skin tag     Right axillary area.  . Hypokalemia   . Headache(784.0)   . Diastolic dysfunction     a. 08/2012 Echo: EF 60-65%, Gr 2 DD, triv MR, PASP .  . Chest pain     a. reports nl cath in the 1990's.  . Stroke (HCC) 04/2013  . Dysrhythmia    Family History: Significant for HTN, HLD, and arthritis in multiple relatives. Mother and sister with ovarian cancer.   Social History: [name] lives at home alone, her cousin lives in the neighborhood and daughter lives close by. There are no stairs in her apartment and she is independent in her ADLs and IADLs. She enjoys social events through her [place].  Desires full-code  Review of Systems: A complete ROS was negative except as per HPI.   Physical Exam: Blood pressure 147/73, pulse 84, temperature 99.2 F (37.3 C), resp. rate 16, SpO2 100 %.   General appearance: Elderly African American woman resting in bed, right arm held in a sling, converstational, appears stated age HENT: Normocephalic, atraumatic, neck supple with full ROM Eyes: Pupils equal round and reactive bilaterally, EOM intact, no scleral icterus present Cardiovascular: Regular rate and rhythm, normal S1/S2 without murmur, rubs, or gallop Respiratory/Chest: Clear to auscultation, normal work of breathing, right chest  wall tender light to palpation Abdomen: Soft, non-tender, non-distended Extremities: Right wrist in ace wrap, tender to palpation with some swelling, distal pulses/sensation/mobility intact in all extremities Skin: Warm, dry, intact, no visible  abrasions Neuro: CNs II-XII grossly intact, alert and oriented Psych: Appropriate affect   Assessment & Plan by Problem: Mrs. Dombrosky is a 75 year old with pertinent PMH of osteopenia, DJD, CVA, arrythmia, and peripheral neuropathy who presents for pain control of a right radial fracture following a mechanical fall from standing.   1. R distal radius fracture, minimally displaced, extends to radiocarpal joint  space, received 2x 2mg  IV morphine initially for pain control   - Pain control: tylenol Q6 prn, toradol IV 15mg  Q6h Sch, tramadol 50mg  Q6 prn  - PT/OT  - Spot EKG given history of arrhythmia  - Hand surgery recs per Dr. Ophelia CharterYates: He indicates that there is no need for acute intervention. Patient would be appropriate for follow-up in his office on Friday for further treatment. Recommend well-padded splint for comfort. Follow up with Dr. Ophelia CharterYates or Dr. Turner Danielsowan outpatient   2. Osteoporosis, chronic  - Check Vitamin D level, continue supplementation  - No hypocalcemia present, may not require supplementation  - Discussed alendronate with patient, who now is amenable to taking this weekly medication  3. Cerebrovascular disease, chronic, managed by PCP and Dr. [name]/Neurology   - Continue 75mg  Plavix QD  - Plan to discontinue 81mg  Aspirin in two weeks, continue for now  4. Hypertension, controlled, managed by PCP  - Atenolol 50mg  QD  5. Hyperlipidemia, controlled  - Atorvastatin 80mg  QD  6. Lower extremity edema, chronic  - Lasix 20mg  tablet, 2x in AM, 1x in PM  7. Hypokalemia, chronic, controlled  - KCl 20 mEq QD  8. Chronic low back pain, secondary to degenerative disc disease  - Continue home Toradol 50mg  Q6 prn  Dispo: Admit patient to  Observation with expected length of stay less than 2 midnights.  Signed: Althia FortsAdam Pearla Mckinny, MD 12/23/2015, 10:24 AM  Pager: [phone]

## 2015-12-23 NOTE — Evaluation (Signed)
Physical Therapy Evaluation Patient Details Name: Oneita JollyLura M Scibilia MRN: [id number] DOB: 12-10-1940 Today's Date: 12/23/2015   History of Present Illness  Pt is a 75 y/o female who presents s/p fall at home. Pt sustained a R distal radius fracture (minimally displaced) in which Dr. Ophelia CharterYates recommended no acute intervention.  Clinical Impression  Pt admitted with above diagnosis. Pt currently with functional limitations due to the deficits listed below (see PT Problem List). At the time of PT eval pt required gross mod assist for basic transfers. Pain was the main limiting factor during session. Recommended use of Stedy for back to bed with nursing staff this afternoon. At this point, I do not feel pt is ready for d/c home and could benefit from at least one more physical therapy session prior to d/c. Pt will have daughter staying with her upon d/c however it appears not 24 hours. Strongly recommend that pt try to work out coverage for when daughter is at work. Pt will benefit from skilled PT to increase their independence and safety with mobility to allow discharge to the venue listed below.       Follow Up Recommendations Home health PT;Supervision/Assistance - 24 hour    Equipment Recommendations  Other (comment) (Possibly hemi-walker depending on progress with PT)    Recommendations for Other Services       Precautions / Restrictions Precautions Precautions: Fall Precaution Comments: RUE splinted and in sling during session.  Required Braces or Orthoses: Sling Restrictions Weight Bearing Restrictions: Yes Other Position/Activity Restrictions: Waiting for WB status clarification. Maintained NWB throughout session.       Mobility  Bed Mobility Overal bed mobility: Needs Assistance Bed Mobility: Rolling;Sidelying to Sit Rolling: Mod assist Sidelying to sit: Mod assist       General bed mobility comments: Assist required for positioning onto side and movement of LE's towards EOB. Pt  with heavy use of rails for support. Heavy mod assist for elevation of trunk to full sitting position.   Transfers Overall transfer level: Needs assistance Equipment used: Rolling walker (2 wheeled) (Therapist held to R side) Transfers: Sit to/from UGI CorporationStand;Stand Pivot Transfers Sit to Stand: Mod assist Stand pivot transfers: Min assist       General transfer comment: Therapist holding to R side of walker to prevent tipping. Would attempt a hemi-walker next session. Pt was able to power-up to full stand from an elevated bed surface and mod assist. Pt required assist for advancement of RLE and general safety. Increased time required to complete pivotal steps around to the recliner.   Ambulation/Gait                Stairs            Wheelchair Mobility    Modified Rankin (Stroke Patients Only)       Balance Overall balance assessment: Needs assistance Sitting-balance support: Feet supported;No upper extremity supported Sitting balance-Leahy Scale: Fair     Standing balance support: Single extremity supported;During functional activity Standing balance-Leahy Scale: Poor                               Pertinent Vitals/Pain Pain Assessment: 0-10 Pain Score: 8  Pain Location: R side - wrist, under breast, into shoulder Pain Descriptors / Indicators: Aching;Stabbing Pain Intervention(s): Limited activity within patient's tolerance;Monitored during session;Repositioned    Home Living Family/patient expects to be discharged to:: Private residence Living Arrangements: Alone Available Help at Discharge:  Family;Available PRN/intermittently Type of Home: Apartment Home Access: Stairs to enter   Entrance Stairs-Number of Steps: 1 Home Layout: One level Home Equipment: Cane - single point;Walker - 2 wheels;Bedside commode      Prior Function Level of Independence: Independent with assistive device(s)         Comments: Sister takes her to the store,  usese the cane all the time and occasionally the RW.     Hand Dominance   Dominant Hand: Right    Extremity/Trunk Assessment   Upper Extremity Assessment: RUE deficits/detail RUE Deficits / Details: Pt reports pain through wrist, shoulder and R ribs under breast. RUE in sling and splinted due to fracture.          Lower Extremity Assessment: RLE deficits/detail RLE Deficits / Details: Pt reports acute pain in hip with weightbearing due to fall to that side.     Cervical / Trunk Assessment: Other exceptions  Communication   Communication: No difficulties  Cognition Arousal/Alertness: Awake/alert Behavior During Therapy: WFL for tasks assessed/performed Overall Cognitive Status: Within Functional Limits for tasks assessed                      General Comments      Exercises        Assessment/Plan    PT Assessment Patient needs continued PT services  PT Diagnosis Difficulty walking;Generalized weakness;Acute pain   PT Problem List Decreased strength;Decreased range of motion;Decreased activity tolerance;Decreased balance;Decreased mobility;Decreased knowledge of use of DME;Decreased safety awareness;Decreased knowledge of precautions;Pain  PT Treatment Interventions DME instruction;Gait training;Stair training;Functional mobility training;Therapeutic activities;Therapeutic exercise;Neuromuscular re-education;Patient/family education   PT Goals (Current goals can be found in the Care Plan section) Acute Rehab PT Goals Patient Stated Goal: Decrease pain PT Goal Formulation: With patient Time For Goal Achievement: 12/30/15 Potential to Achieve Goals: Good    Frequency Min 4X/week   Barriers to discharge Decreased caregiver support Pt will not have 24 hour assistance available at d/c.     Co-evaluation               End of Session Equipment Utilized During Treatment: Gait belt;Other (comment) (Sling) Activity Tolerance: Patient limited by  pain Patient left: in chair;with call bell/phone within reach;with chair alarm set;with nursing/sitter in room Nurse Communication: Mobility status;Need for lift equipment (Recommended Stedy for back to bed)    Functional Assessment Tool Used: Clinical judgement Functional Limitation: Mobility: Walking and moving around Mobility: Walking and Moving Around Current Status [phone](G8978): At least 20 percent but less than 40 percent impaired, limited or restricted Mobility: Walking and Moving Around Goal Status [phone](G8979): At least 1 percent but less than 20 percent impaired, limited or restricted    Time: 1131-1201 PT Time Calculation (min) (ACUTE ONLY): 30 min   Charges:   PT Evaluation $PT Eval Moderate Complexity: 1 Procedure PT Treatments $Gait Training: 8-22 mins   PT G Codes:   PT G-Codes **NOT FOR INPATIENT CLASS** Functional Assessment Tool Used: Clinical judgement Functional Limitation: Mobility: Walking and moving around Mobility: Walking and Moving Around Current Status ([id number](G8978): At least 20 percent but less than 40 percent impaired, limited or restricted Mobility: Walking and Moving Around Goal Status [phone](G8979): At least 1 percent but less than 20 percent impaired, limited or restricted    Conni SlipperKirkman, Cayson Kalb 12/23/2015, 1:17 PM   Conni SlipperLaura Anina Schnake, PT, DPT Acute Rehabilitation Services Pager: [phone]

## 2015-12-23 NOTE — Evaluation (Signed)
Occupational Therapy Evaluation Patient Details Name: Carla JollyLura M Cook MRN: [id number] DOB: 30-Jul-1940 Today's Date: 12/23/2015    History of Present Illness Pt is a 75 y/o female who presents s/p fall while loading groceries in her sister's car. Resulted in R distal radius fx, treated conservatively.    Clinical Impression   Pt was independent with AD prior to admission. Presents with pain in her R shoulder, wrist and hip. She demonstrates generalized weakness, requiring heavy moderate assistance to perform sit to stand and pivot transfer. She was not able to ambulate this visit. Pt requires moderate to maximum assistance for bathing, dressing and toileting. Will follow acutely.    Follow Up Recommendations  Home health OT;Supervision/Assistance - 24 hour (may need SNF if pt cannot secure 24 hour care)   Equipment Recommendations       Recommendations for Other Services       Precautions / Restrictions Precautions Precautions: Fall Precaution Comments: R UE splinted and in sling Required Braces or Orthoses: Sling Restrictions Weight Bearing Restrictions: Yes Other Position/Activity Restrictions: Waiting for WB status clarification. Maintained NWB throughout session.       Mobility Bed Mobility Overal bed mobility: Needs Assistance    Sit to supine: Mod assist   General bed mobility comments: assist to raise LEs back into bed  Transfers Overall transfer level: Needs assistance Equipment used: 1 person hand held assist Transfers: Sit to/from Stand;Stand Pivot Transfers Sit to Stand: Mod assist Stand pivot transfers: Mod assist       General transfer comment: heavy mod assist to rise to standing, mod assist for balance with pt having difficulty picking advancing R foot    Balance Overall balance assessment: Needs assistance Sitting-balance support: Feet supported;No upper extremity supported Sitting balance-Leahy Scale: Fair     Standing balance support: Single  extremity supported;During functional activity Standing balance-Leahy Scale: Poor                              ADL Overall ADL's : Needs assistance/impaired Eating/Feeding: Set up;Sitting Eating/Feeding Details (indicate cue type and reason): with L hand Grooming: Wash/dry hands;Wash/dry face;Minimal assistance;Sitting   Upper Body Bathing: Moderate assistance;Sitting   Lower Body Bathing: Maximal assistance;Sit to/from stand   Upper Body Dressing : Moderate assistance;Sitting   Lower Body Dressing: Maximal assistance;Sit to/from stand   Toilet Transfer: Moderate assistance;Stand-pivot;BSC   Toileting- Clothing Manipulation and Hygiene: Maximal assistance;Sit to/from stand               Vision     Perception     Praxis      Pertinent Vitals/Pain Pain Assessment: Faces Pain Score: 8  Faces Pain Scale: Hurts even more Pain Location: R shoulder, wrist and hip Pain Descriptors / Indicators: Aching;Sore;Guarding;Grimacing Pain Intervention(s): Limited activity within patient's tolerance;Monitored during session;Premedicated before session;Repositioned     Hand Dominance Right   Extremity/Trunk Assessment Upper Extremity Assessment Upper Extremity Assessment: RUE deficits/detail RUE Deficits / Details: splinted from forearm to MPs, full AROM of shoulder, elbow and fingers, but with increased pain RUE: Unable to fully assess due to immobilization RUE Coordination: decreased gross motor;decreased fine motor   Lower Extremity Assessment Lower Extremity Assessment: Defer to PT evaluation RLE Deficits / Details: Pt reports acute pain in hip with weightbearing due to fall to that side.    Cervical / Trunk Assessment Cervical / Trunk Assessment: Other exceptions Cervical / Trunk Exceptions: Forward head/rounded shoulders    Communication Communication  Communication: No difficulties   Cognition Arousal/Alertness: Awake/alert Behavior During Therapy: WFL  for tasks assessed/performed Overall Cognitive Status: Within Functional Limits for tasks assessed                     General Comments       Exercises       Shoulder Instructions      Home Living Family/patient expects to be discharged to:: Private residence Living Arrangements: Alone Available Help at Discharge: Family;Available PRN/intermittently Type of Home: Apartment Home Access: Stairs to enter Entrance Stairs-Number of Steps: 1   Home Layout: One level     Bathroom Shower/Tub: Tub/shower unit Shower/tub characteristics: Curtain FirefighterBathroom Toilet: Standard Bathroom Accessibility: Yes   Home Equipment: Cane - single point;Walker - 2 wheels;Bedside commode          Prior Functioning/Environment Level of Independence: Independent with assistive device(s)        Comments: Sister takes her to the store, uses the cane all the time and occasionally the RW.    OT Diagnosis: Generalized weakness;Acute pain   OT Problem List: Decreased strength;Decreased activity tolerance;Impaired balance (sitting and/or standing);Decreased coordination;Decreased knowledge of use of DME or AE;Pain;Impaired UE functional use   OT Treatment/Interventions: Self-care/ADL training;DME and/or AE instruction;Patient/family education;Balance training;Therapeutic activities    OT Goals(Current goals can be found in the care plan section) Acute Rehab OT Goals Patient Stated Goal: Decrease pain, return home OT Goal Formulation: With patient Time For Goal Achievement: 12/30/15 Potential to Achieve Goals: Good  OT Frequency: Min 3X/week   Barriers to D/C: Decreased caregiver support          Co-evaluation              End of Session Equipment Utilized During Treatment: Gait belt  Activity Tolerance: Patient tolerated treatment well Patient left: in bed;with call bell/phone within reach;with family/visitor present;with bed alarm set   Time: [id number] OT Time Calculation  (min): 30 min Charges:  OT General Charges $OT Visit: 1 Procedure OT Evaluation $OT Eval Moderate Complexity: 1 Procedure OT Treatments $Self Care/Home Management : 8-22 mins G-Codes: OT G-codes **NOT FOR INPATIENT CLASS** Functional Assessment Tool Used: clinical judgement Functional Limitation: Self care Self Care Current Status ([id number](G8987): At least 60 percent but less than 80 percent impaired, limited or restricted Self Care Goal Status (J1914(G8988): At least 1 percent but less than 20 percent impaired, limited or restricted  Evern BioMayberry, Kalinda Romaniello Lynn 12/23/2015, 2:51 PM  [phone]

## 2015-12-23 NOTE — ED Notes (Signed)
Bed: [id number] Expected date:  Expected time:  Means of arrival:  Comments: 75 yo F  Fall  Right side pain, right wrist deformity

## 2015-12-23 NOTE — ED Notes (Signed)
Per EMS pt fell in a parking lot across from her house  Pt states she lost her balance causing her to fall  Pt is c/o pain to her right side  Pt initially was c/o right hip pain, no shortening or rotation noted  Pt has an obvious wrist deformity noted  Splint applied  Pt is c/o some right flank pain not tender to palpate  Also c/o some neck tenderness so c collar applied by EMS  Pt was given of fentanyl in route with good results

## 2015-12-24 ENCOUNTER — Other Ambulatory Visit: Payer: Self-pay | Admitting: *Deleted

## 2015-12-24 DIAGNOSIS — S20211A Contusion of right front wall of thorax, initial encounter: Secondary | ICD-10-CM | POA: Diagnosis not present

## 2015-12-24 DIAGNOSIS — Y9389 Activity, other specified: Secondary | ICD-10-CM | POA: Diagnosis not present

## 2015-12-24 DIAGNOSIS — M25551 Pain in right hip: Secondary | ICD-10-CM

## 2015-12-24 DIAGNOSIS — S52501A Unspecified fracture of the lower end of right radius, initial encounter for closed fracture: Secondary | ICD-10-CM | POA: Diagnosis not present

## 2015-12-24 DIAGNOSIS — R2681 Unsteadiness on feet: Secondary | ICD-10-CM | POA: Diagnosis not present

## 2015-12-24 DIAGNOSIS — M25511 Pain in right shoulder: Secondary | ICD-10-CM

## 2015-12-24 DIAGNOSIS — W19XXXA Unspecified fall, initial encounter: Secondary | ICD-10-CM | POA: Diagnosis not present

## 2015-12-24 LAB — BASIC METABOLIC PANEL
Anion gap: 5 (ref 5–15)
BUN: 15 mg/dL (ref 6–20)
CALCIUM: 9.1 mg/dL (ref 8.9–10.3)
CO2: 25 mmol/L (ref 22–32)
CREATININE: 0.67 mg/dL (ref 0.44–1.00)
Chloride: 107 mmol/L (ref 101–111)
GFR calc Af Amer: >60 mL/min (ref >60)
GFR calc non Af Amer: >60 mL/min (ref >60)
GLUCOSE: 171 mg/dL — AB (ref 65–99)
Potassium: 3.2 mmol/L — ABNORMAL LOW (ref 3.5–5.1)
Sodium: 137 mmol/L (ref 135–145)

## 2015-12-24 LAB — CBC
HEMATOCRIT: 34.4 % — AB (ref 36.0–46.0)
Hemoglobin: 10.9 g/dL — ABNORMAL LOW (ref 12.0–15.0)
MCH: 27.4 pg (ref 26.0–34.0)
MCHC: 31.7 g/dL (ref 30.0–36.0)
MCV: 86.4 fL (ref 78.0–100.0)
PLATELETS: 114 10*3/uL — AB (ref 150–400)
RBC: 3.98 MIL/uL (ref 3.87–5.11)
RDW: 13.1 % (ref 11.5–15.5)
WBC: 5.3 10*3/uL (ref 4.0–10.5)

## 2015-12-24 LAB — VITAMIN D 25 HYDROXY (VIT D DEFICIENCY, FRACTURES): VIT D 25 HYDROXY: 19.9 ng/mL — AB (ref 30.0–100.0)

## 2015-12-24 MED ORDER — POTASSIUM CHLORIDE CRYS ER 20 MEQ PO TBCR
EXTENDED_RELEASE_TABLET | ORAL | Status: DC
Start: 2015-12-24 — End: 2016-09-30

## 2015-12-24 MED ORDER — CALCIUM-VITAMIN D3 600-400 MG-UNIT PO TABS: 1.0000 | ORAL_TABLET | Freq: Two times a day (BID) | ORAL | Status: DC

## 2015-12-24 MED ORDER — CALCIUM CARBONATE-VITAMIN D 500-200 MG-UNIT PO TABS
1.0000 | ORAL_TABLET | Freq: Two times a day (BID) | ORAL | Status: DC
  Administered 2015-12-24 – 2015-12-25 (×3): 1 via ORAL
  Filled 2015-12-24 (×3): qty 1

## 2015-12-24 MED ORDER — POTASSIUM CHLORIDE CRYS ER 20 MEQ PO TBCR
60.0000 meq | EXTENDED_RELEASE_TABLET | Freq: Once | ORAL | Status: AC
  Administered 2015-12-24: 60 meq via ORAL
  Filled 2015-12-24: qty 3

## 2015-12-24 MED ORDER — POTASSIUM CHLORIDE CRYS ER 20 MEQ PO TBCR: 40.0000 meq | EXTENDED_RELEASE_TABLET | Freq: Once | ORAL | Status: DC

## 2015-12-24 MED ORDER — CHOLECALCIFEROL 50 MCG (2000 UT) PO CAPS
1.0000 | ORAL_CAPSULE | Freq: Every day | ORAL | Status: DC
  Filled 2015-12-24: qty 1

## 2015-12-24 MED ORDER — VITAMIN D 1000 UNITS PO TABS
2000.0000 [IU] | ORAL_TABLET | Freq: Every day | ORAL | Status: DC
  Administered 2015-12-24 – 2015-12-25 (×2): 2000 [IU] via ORAL
  Filled 2015-12-24 (×2): qty 2

## 2015-12-24 NOTE — Discharge Summary (Signed)
Name: [name] MRN: [id number] DOB: Oct 30, 1940 75 y.o. PCP: Tyson Aliasuncan Thomas Vincent, MD  Date of Admission: 12/23/2015 12:32 AM Date of Discharge: 12/25/2015 Attending Physician: Burns SpainElizabeth A Butcher, MD  Discharge Diagnosis: 1. Fracture of the distal right radius   Active Problems:   Vitamin D deficiency   Osteoarthritis   Osteoporosis   Bilateral leg edema due to diastolic dysfunction   Radius fracture   Fall  Discharge Medications:   Medication List    TAKE these medications        acetaminophen 500 MG tablet  Commonly known as:  TYLENOL  Take 1-2 tablets (500-1,000 mg total) by mouth every 6 (six) hours as needed for moderate pain.     alendronate 70 MG tablet  Commonly known as:  FOSAMAX  Take 1 tablet (70 mg total) by mouth every 7 (seven) days. Take with a full glass of water on an empty stomach.     aspirin EC 81 MG tablet  Take 1 tablet (81 mg total) by mouth daily. Stop taking on Mon 01/05/2016     atenolol 50 MG tablet  Commonly known as:  TENORMIN  TAKE 1 TABLET (50 MG TOTAL) BY MOUTH DAILY.     atorvastatin 80 MG tablet  Commonly known as:  LIPITOR  Take 1 tablet (80 mg total) by mouth daily at 6 PM.     Calcium-Vitamin D3 600-400 MG-UNIT Tabs  Take 1 tablet by mouth 2 (two) times daily.     clopidogrel 75 MG tablet  Commonly known as:  PLAVIX  Take 1 tablet (75 mg total) by mouth daily.     [employer] VITAMIN D 2000 units Caps  Generic drug:  Cholecalciferol  TAKE 1 CAPSULE EVERY DAY     furosemide 20 MG tablet  Commonly known as:  LASIX  Take 3 tablets (60 mg total) by mouth daily. Take 2 tablets in the morning and 1 tablet at night     potassium chloride SA 20 MEQ tablet  Commonly known as:  KLOR-CON M20  TAKE 1 TABLET (20 MEQ TOTAL) BY MOUTH DAILY.     traMADol 50 MG tablet  Commonly known as:  ULTRAM  Take 50 mg by mouth every 6 (six) hours as needed for moderate pain.        Disposition and follow-up:   [name] [facility]  [facility].  At the hospital follow up visit please address:  1.  Pain control and mobility- assess pain and mobility of R wrist, shoulder, hip - as well as ambulation and recovery of independence in ADL/IADLs, may require repeat plain films if pain persists longer than expected  - R radial fracture - assess mobility and function, follow up with Dr. Ophelia CharterYates  - Osteoporosis, vitamin D deficient - ensure taking calcium/vit D supplements and alendronate  - Hypokalemia - repleted during admission, discharged on previous scheduled KCl dose  2.  Labs / imaging needed at time of follow-up: BMP  3.  Pending labs/ test needing follow-up: None  Follow-up Appointments: Follow up with orthopedics, Dr. Ophelia CharterYates or Dr. Turner Danielsowan within 1-2 weeks Follow-up Information    Follow up with Eldred MangesYATES,MARK C, MD.   Specialty:  Orthopedic Surgery   Why:  You should receive a call for a follow up appointment scheduled within the next week. If not please call and schedule.    Contact information:   [address] [place] [address] [phone]  Follow up with [facility].   Why:  Someone from [facility] will contact you to arrange start date and time for therapy.   Contact information:   [address] [place] [address] [phone]      Hospital Course by problem list:  1. Right radial fracture, due to mechanical fall from standing on 7/4  in the setting of osteoporosis, well-padded splint and sling for comfort, CT head and c-spine negative, XR right ribs, bilat hips, and right ankle negative, 10/10 pain on admission controlled briefly with morphine, scheduled toradol, then de-escalated to tramadol/tylenol Q6 prn by discharge, PT/OT recommended SNF or home with 24/7 assist  2. Osteoporosis, this fall from standing is consistent with a fragility fracture, patient was not taking alendronate (fear of side effect) and  was out of calcium/vitamin D upon admission, vitamin D was low at 19.9, she is amenable to taking these medications now after brief discussion  3. Hypokalemia, mild to 3.2, resolved with po supplementation  Continued home medication for chronic conditions throughout hospital stay.  Discharge Vitals:   BP 165/85 mmHg  Pulse 75  Temp(Src) 98.7 F (37.1 C) (Oral)  Resp 16  SpO2 99%  LMP  (LMP Unknown)  Pertinent Labs, Studies, and Procedures:  BMP Latest Ref Rng 12/25/2015 12/24/2015 12/23/2015  Glucose 65 - 99 mg/dL 213(Y106(H) 865(H171(H) 846(N155(H)  BUN 6 - 20 mg/dL 13 15 17   Creatinine 0.44 - 1.00 mg/dL 6.290.50 5.280.67 4.130.69  Sodium 135 - 145 mmol/L 136 137 141  Potassium 3.5 - 5.1 mmol/L 3.8 3.2(L) 3.7  Chloride 101 - 111 mmol/L 105 107 108  CO2 22 - 32 mmol/L 27 25 25   Calcium 8.9 - 10.3 mg/dL 9.1 9.1 9.4    Ref Range 1d ago (12/23/15) 22219yr ago (07/09/14) 62219yr ago (02/27/14) 4319yr ago (12/13/12) 2513yr ago (08/11/11)   Vit D, 25-Hydroxy 30.0 - 100.0 ng/mL 19.9 (L) 22 31 18 23         RIGHT WRIST - COMPLETE 3+ VIEW - 12/23/2015  IMPRESSION: Minimally displaced fracture involving the distal radial metaphysis, extending to the radiocarpal joint.  Discharge Instructions: Discharge Instructions    Call MD for:  difficulty breathing, headache or visual disturbances    Complete by:  As directed      Call MD for:  extreme fatigue    Complete by:  As directed      Call MD for:  persistant dizziness or light-headedness    Complete by:  As directed      Call MD for:  severe uncontrolled pain    Complete by:  As directed      Diet - low sodium heart healthy    Complete by:  As directed      Discharge instructions    Complete by:  As directed   Please take your medications as prescribed. Remember your daily calcium and vitamin D, and weekly Alendronate. You have osteoporosis (brittle bones) and these meds will help prevent fractures like this in the future. Please use assistance if you feel unsteady on your feet  and work with physical and occupational therapy to recovery your strength and coordination. It is important to stay active, use Tylenol and/or Tramadol if you find that pain is preventing your activity.   Also please inhale deeply several times daily, using the incentive spirometer, until your rib/chest pain has disappeared. This will prevent pneumonia from shallow breathing.     Increase activity slowly    Complete by:  As directed           Signed: Althia Forts, MD 12/25/2015, 1:52 PM   Pager: [phone]

## 2015-12-24 NOTE — Care Management Obs Status (Signed)
MEDICARE OBSERVATION STATUS NOTIFICATION   Patient Details  Name: [name] MRN: [id number] Date of Birth: 1941-03-30   Medicare Observation Status Notification Given:  Yes    [name], Carla Macleod Naomi, RN 12/24/2015, 11:13 AM

## 2015-12-24 NOTE — [place] FL2 (Deleted)
[place] MEDICAID FL2 LEVEL OF CARE SCREENING TOOL     IDENTIFICATION  Patient Name: [name] Birthdate: 1941/04/08 Sex: female Admission Date (Current Location): 12/23/2015  [facility] [place]   Facility and Address:  [facility] [place], [address], [place], [place] [address]      Provider Number: [id number]  Attending Physician Name and Address:  Burns SpainElizabeth A Butcher, MD  Relative Name and Phone Number:       Current Level of Care: Hospital Recommended Level of Care: Skilled Nursing Facility Prior Approval Number:    Date Approved/Denied:   PASRR Number: [id number]  Discharge Plan: SNF    Current Diagnoses: Patient Active Problem List   Diagnosis Date Noted  . Radius fracture 12/23/2015  . Fall 12/23/2015  . Irregular heartbeat 10/16/2015  . Cryptogenic stroke (HCC) 10/16/2015  . Atrial fibrillation, unspecified 10/16/2015  . Acute CVA (cerebrovascular accident) (HCC) 09/28/2015  . Chronic diastolic (congestive) heart failure (HCC) 09/28/2015  . Lower back pain 04/25/2015  . Risk for falls 03/03/2015  . Healthcare maintenance 04/08/2014  . Vitamin D deficiency 05/08/2010  . Osteoporosis 04/03/2009  . Bilateral leg edema due to diastolic dysfunction 02/22/2007  . Essential hypertension 09/14/2006  . Osteoarthritis 04/27/2006    Orientation RESPIRATION BLADDER Height & Weight     Self, Time, Situation, Place  Normal Continent Weight:   Height:     BEHAVIORAL SYMPTOMS/MOOD NEUROLOGICAL BOWEL NUTRITION STATUS      Continent Diet (Please see discharge summary.)  AMBULATORY STATUS COMMUNICATION OF NEEDS Skin   Limited Assist Verbally Normal                       Personal Care Assistance Level of Assistance  Bathing, Feeding, Dressing Bathing Assistance: Limited assistance Feeding assistance: Limited assistance Dressing Assistance: Limited assistance     Functional Limitations Info              SPECIAL CARE FACTORS FREQUENCY  PT (By licensed PT), OT (By licensed OT)     PT Frequency: 5 OT Frequency: 5            Contractures      Additional Factors Info  Code Status, Allergies Code Status Info: FULL Allergies Info: Sulfonamide Derivatives, Penicillins           Current Medications (12/24/2015):  This is the current hospital active medication list Current Facility-Administered Medications  Medication Dose Route Frequency Provider Last Rate Last Dose  . acetaminophen (TYLENOL) tablet 500-1,000 mg  500-1,000 mg Oral Q6H PRN Lora PaulaJennifer T Krall, MD      . aspirin EC tablet 81 mg  81 mg Oral Daily Lora PaulaJennifer T Krall, MD   81 mg at 12/24/15 0935  . atenolol (TENORMIN) tablet 50 mg  50 mg Oral Daily Lora PaulaJennifer T Krall, MD   50 mg at 12/24/15 0935  . atorvastatin (LIPITOR) tablet 80 mg  80 mg Oral q1800 Lora PaulaJennifer T Krall, MD   80 mg at 12/23/15 1719  . clopidogrel (PLAVIX) tablet 75 mg  75 mg Oral Daily Lora PaulaJennifer T Krall, MD   75 mg at 12/24/15 0935  . docusate sodium (COLACE) capsule 100 mg  100 mg Oral BID Lora PaulaJennifer T Krall, MD   100 mg at 12/24/15 0935  . enoxaparin (LOVENOX) injection 40 mg  40 mg Subcutaneous Q24H Lora PaulaJennifer T Krall, MD   40 mg at 12/24/15 1110  . furosemide (LASIX) tablet 20 mg  20  mg Oral q1800 Lora PaulaJennifer T Krall, MD   20 mg at 12/23/15 1719  . senna-docusate (Senokot-S) tablet 1 tablet  1 tablet Oral QHS PRN Lora PaulaJennifer T Krall, MD   1 tablet at 12/24/15 1109  . traMADol (ULTRAM) tablet 50 mg  50 mg Oral Q6H PRN Lora PaulaJennifer T Krall, MD   50 mg at 12/24/15 1110     Discharge Medications: Please see discharge summary for a list of discharge medications.  Relevant Imaging Results:  Relevant Lab Results:   Additional Information SSN: [id number]  Rod MaeVaughn, Leira Regino S, LCSW

## 2015-12-24 NOTE — Progress Notes (Signed)
Physical Therapy Treatment Patient Details Name: [name] MRN: [id number] DOB: 11-15-1940 Today's Date: 12/24/2015    History of Present Illness Pt is a 75 y/o female who presents s/p fall at home.     PT Comments    Pt progressing towards physical therapy goals. Was able to progress ambulation to 25 feet with hemiwalker for support and min assist. Overall, pt moving very slow and do not feel she is safe to return to her apartment without assistance (lives alone). Discussed recommendation of SNF at d/c and pt states that she would like to discuss with her daughter before making any final decisions. Will continue to follow.   Follow Up Recommendations  SNF;Supervision/Assistance - 24 hour     Equipment Recommendations  Other (comment) (Hemi-walker)    Recommendations for Other Services       Precautions / Restrictions Precautions Precautions: Fall Precaution Comments: RUE splinted and in sling during session.  Required Braces or Orthoses: Sling Restrictions Weight Bearing Restrictions: Yes Other Position/Activity Restrictions: Waiting for WB status clarification. Maintained NWB throughout session.     Mobility  Bed Mobility               General bed mobility comments: Pt was sitting in the recliner chair when PT arrived.   Transfers Overall transfer level: Needs assistance Equipment used: Hemi-walker Transfers: Sit to/from Stand Sit to Stand: Mod assist Stand pivot transfers: Min assist       General transfer comment: Assist to power-up to full standing position. Pt was able to push up from chair and maintain balance fairly well to reach for the hemi-walker.  Ambulation/Gait Ambulation/Gait assistance: Min assist Ambulation Distance (Feet): 25 Feet Assistive device: Hemi-walker Gait Pattern/deviations: Step-to pattern;Decreased stride length;Trunk flexed Gait velocity: Decreased Gait velocity interpretation: Below normal speed for age/gender General  Gait Details: VC's for sequencing and general safety with the HW, Pt required assist to advance walker, and cueing for improved posture.    Stairs            Wheelchair Mobility    Modified Rankin (Stroke Patients Only)       Balance Overall balance assessment: Needs assistance Sitting-balance support: Feet supported;No upper extremity supported Sitting balance-Leahy Scale: Fair     Standing balance support: Single extremity supported;During functional activity Standing balance-Leahy Scale: Poor Standing balance comment: Hands-on assist for safety.                     Cognition Arousal/Alertness: Awake/alert Behavior During Therapy: WFL for tasks assessed/performed Overall Cognitive Status: Within Functional Limits for tasks assessed                      Exercises      General Comments        Pertinent Vitals/Pain Pain Assessment: Faces Faces Pain Scale: Hurts little more Pain Location: R hip and shoulder Pain Descriptors / Indicators: Aching;Grimacing;Guarding Pain Intervention(s): Limited activity within patient's tolerance;Monitored during session;Repositioned    Home Living                      Prior Function            PT Goals (current goals can now be found in the care plan section) Acute Rehab PT Goals Patient Stated Goal: Decrease pain PT Goal Formulation: With patient Time For Goal Achievement: 12/30/15 Potential to Achieve Goals: Good Progress towards PT goals: Progressing toward goals    Frequency  Min 4X/week    PT Plan Discharge plan needs to be updated    Co-evaluation             End of Session Equipment Utilized During Treatment: Gait belt;Other (comment) (Sling) Activity Tolerance: Patient limited by fatigue Patient left: in chair;with call bell/phone within reach;with nursing/sitter in room     Time: [id number] PT Time Calculation (min) (ACUTE ONLY): 26 min  Charges:  $Gait Training: 23-37  mins                    G Codes:      Carla SlipperKirkman, [name] 12/24/2015, 1:26 PM   Carla SlipperLaura Jimma Cook, PT, DPT Acute Rehabilitation Services Pager: [phone]

## 2015-12-24 NOTE — Plan of Care (Signed)
Talked with patient this afternoon, still requires significant assistance  to walk and reach the restroom. Patient stated her desire to go home rather than to a skilled nursing facility if possible. She talked with her daughter on the phone just now. Her daughter is working today but is available tomorrow to provide transport and begin assisting [name] at home.

## 2015-12-24 NOTE — Progress Notes (Signed)
  Date: 12/24/2015  Patient name: [name]  Medical record number: [id number]  Date of birth: 19-Oct-1940   This patient has been seen and the plan of care was discussed with the house staff. Please see their note for complete details. I concur with their findings with the following additions/corrections: [name] was seen on AM rounds with the team. She was feeling a bit better but still with R wrist, shoulder, and R hip pain. Her hip pain is affecting her mobility. Her hip plain film on admit was nl. She is deciding on SNF vs home with home PT and family support.  Burns SpainElizabeth A Butcher, MD 12/24/2015, 12:55 PM

## 2015-12-24 NOTE — Telephone Encounter (Signed)
Requesting 90 day supply.

## 2015-12-24 NOTE — Progress Notes (Signed)
   Subjective: [name] states that she is feeling better today and that overall her pain is better (approx 6/10, mainly R shoulder > R wrist > R hip). She had a good appetite for breakfast and slept through the night. She said she still feels sore when she gets up and walks to the bedside chair. She expressed interest in working with PT/OT today as well as talking with family about having someone at home to help her when she leaves the hospital.   Objective: Vital signs in last 24 hours: Filed Vitals:   12/23/15 1549 12/23/15 2134 12/24/15 0534 12/24/15 0934  BP: 137/76 112/57 136/69 138/83  Pulse: 71 70 70 80  Temp: 98.8 F (37.1 C) 99.4 F (37.4 C) 97.9 F (36.6 C)   TempSrc:  Oral Oral   Resp: 16 16 17    SpO2: 99% 95% 94%    Physical Exam General appearance: Elderly African American woman resting comfortably in bed, right arm held in a sling, converstational Cardiovascular: Regular rate and rhythm, normal S1/S2 without murmur, rubs, or gallop Respiratory/Chest: Clear to auscultation, normal work of breathing, right chest wall tender light to palpation Abdomen: Soft, non-tender, non-distended Extremities: Right wrist in ace wrap, distal pulses/sensation/mobility intact in all extremities Skin: Warm, dry, intact, no visibleabrasions Neuro: CNs II-XII grossly intact, alert and oriented Psych: Appropriate affect  Labs / Imaging / Procedures:  BMP Latest Ref Rng 12/24/2015 12/23/2015 09/28/2015  Glucose 65 - 99 mg/dL 161(W171(H) 960(A155(H) 540(J104(H)  BUN 6 - 20 mg/dL 15 17 7   Creatinine 0.44 - 1.00 mg/dL 8.110.67 9.140.69 7.820.49  Sodium 135 - 145 mmol/L 137 141 140  Potassium 3.5 - 5.1 mmol/L 3.2(L) 3.7 3.5  Chloride 101 - 111 mmol/L 107 108 105  CO2 22 - 32 mmol/L 25 25 27   Calcium 8.9 - 10.3 mg/dL 9.1 9.4 9.5    Assessment/Plan: [name] is a 75 year old with pertinent PMH of osteopenia, DJD, CVA, arrythmia, and peripheral neuropathy who presents for pain control of a right radial fracture  following a mechanical fall from standing.  1. R distal radius fracture, minimally displaced, extends to radiocarpal joint space, in the setting of osteoporosis - Pain control: tylenol Q6 prn, tramadol 50mg  Q6 prn, discontinuing scheduled Toradol today - PT/OT  - Follow up with Dr. Ophelia CharterYates or Dr. Turner Danielsowan outpatient   2. Hypokalemia, new today, K 3.2  - po KCl 60mg  x1, then resume home daily KCl regimen   3. Osteoporosis, chronic - Vitamin D level pending - Continue vitamin D and calcium supplementation - Discussed alendronate with patient, who now is amenable to taking this weekly medication  4. Chronic conditions/home meds: Cerebrovascular disease, chronic, managed by PCP and Dr. [name]/Neurology  - Continue 75mg  Plavix QD - Plan to discontinue 81mg  Aspirin in two weeks, continue for now Hypertension, controlled, managed by PCP - Atenolol 50mg  QD Hyperlipidemia, controlled - Atorvastatin 80mg  QD Lower extremity edema, chronic - Lasix 20mg  tablet, 2x in AM, 1x in PM Chronic low back pain, secondary to degenerative disc disease - Continue home Tramadol 50mg  Q6 prn  FEN: Normal diet  DVTppx: Lovenox  Dispo: Anticipated discharge in today or tomorrow.  Althia FortsAdam Emanii Bugbee, MD 12/24/2015, 9:58 AM Pager: [phone]

## 2015-12-24 NOTE — Progress Notes (Signed)
Occupational Therapy Treatment Patient Details Name: Oneita JollyLura M Zemanek MRN: [id number] DOB: 1941-02-05 Today's Date: 12/24/2015    History of present illness Pt is a 75 y/o female who presents s/p fall while loading groceries in her sister's car. Resulted in R distal radius fx, treated conservatively.    OT comments  Pt continues to require heavy moderate assistance for sit to stand and to control descent to chair. Ambulated several feet with hand held assist with nurse assisting with chair to follow and for safety. Pt with increased R hip pain, reports hx of arthritis, aggravated by fall. Pt is aware of the amount of assistance she requires for d/c home. Spoke to pt about a brief stay in rehab until she can mobilize independently and alerted case manager of concern with pt returning home without adequate assistance. Instructed pt in edema management of R UE and elevated on pillows. Will continue to follow.  Follow Up Recommendations  SNF;Supervision/Assistance - 24 hour    Equipment Recommendations       Recommendations for Other Services      Precautions / Restrictions Precautions Precautions: Fall Precaution Comments: R UE splinted, educated in edema management and elevated on 2 pillows Required Braces or Orthoses: Sling Restrictions Weight Bearing Restrictions: No Other Position/Activity Restrictions: maintained NWB through R hand       Mobility Bed Mobility               General bed mobility comments: pt in chair  Transfers Overall transfer level: Needs assistance Equipment used: 1 person hand held assist Transfers: Sit to/from Stand;Stand Pivot Transfers Sit to Stand: Mod assist Stand pivot transfers: Min assist       General transfer comment: heavy mod assist to rise to standing, mod assist for balance with pt having difficulty picking advancing R foot    Balance     Sitting balance-Leahy Scale: Fair       Standing balance-Leahy Scale: Poor                      ADL Overall ADL's : Needs assistance/impaired     Grooming: Oral care;Sitting;Wash/dry hands;Wash/dry face;Minimal assistance               Lower Body Dressing: Maximal assistance;Sit to/from stand   Toilet Transfer: Moderate assistance;Stand-pivot;BSC   Toileting- Clothing Manipulation and Hygiene: Maximal assistance;Sit to/from stand       Functional mobility during ADLs: Moderate assistance (5 ft with hand held assist, second person for chair/safety)        Vision                     Perception     Praxis      Cognition   Behavior During Therapy: [facility] for tasks assessed/performed Overall Cognitive Status: Within Functional Limits for tasks assessed                       Extremity/Trunk Assessment               Exercises     Shoulder Instructions       General Comments      Pertinent Vitals/ Pain       Pain Assessment: Faces Faces Pain Scale: Hurts even more Pain Location: R hip and shoulder Pain Descriptors / Indicators: Aching;Grimacing;Guarding Pain Intervention(s): Limited activity within patient's tolerance;Monitored during session;RN gave pain meds during session;Repositioned  Home Living  Prior Functioning/Environment              Frequency Min 3X/week     Progress Toward Goals  OT Goals(current goals can now be found in the care plan section)  Progress towards OT goals: Not progressing toward goals - comment (pain limiting)  Acute Rehab OT Goals Patient Stated Goal: Decrease pain, return home Time For Goal Achievement: 12/30/15 Potential to Achieve Goals: Good  Plan Discharge plan needs to be updated    Co-evaluation                 End of Session Equipment Utilized During Treatment: Gait belt   Activity Tolerance Patient limited by pain   Patient Left in chair;with call bell/phone within reach;with nursing/sitter in  room   Nurse Communication Mobility status (may need to consider snf)        Time: 4403-47420911-0948 OT Time Calculation (min): 37 min  Charges: OT General Charges $OT Visit: 1 Procedure OT Treatments $Self Care/Home Management : 23-37 mins  Evern BioMayberry, Jhonathan Desroches Lynn 12/24/2015, 10:19 AM  [phone]

## 2015-12-25 DIAGNOSIS — I10 Essential (primary) hypertension: Secondary | ICD-10-CM | POA: Diagnosis not present

## 2015-12-25 DIAGNOSIS — R269 Unspecified abnormalities of gait and mobility: Secondary | ICD-10-CM | POA: Diagnosis not present

## 2015-12-25 DIAGNOSIS — S52501A Unspecified fracture of the lower end of right radius, initial encounter for closed fracture: Secondary | ICD-10-CM | POA: Diagnosis not present

## 2015-12-25 DIAGNOSIS — M519 Unspecified thoracic, thoracolumbar and lumbosacral intervertebral disc disorder: Secondary | ICD-10-CM | POA: Diagnosis not present

## 2015-12-25 DIAGNOSIS — R2681 Unsteadiness on feet: Secondary | ICD-10-CM | POA: Diagnosis not present

## 2015-12-25 DIAGNOSIS — S20211A Contusion of right front wall of thorax, initial encounter: Secondary | ICD-10-CM | POA: Diagnosis not present

## 2015-12-25 DIAGNOSIS — W19XXXA Unspecified fall, initial encounter: Secondary | ICD-10-CM | POA: Diagnosis not present

## 2015-12-25 DIAGNOSIS — Y9389 Activity, other specified: Secondary | ICD-10-CM | POA: Diagnosis not present

## 2015-12-25 LAB — BASIC METABOLIC PANEL
ANION GAP: 4 — AB (ref 5–15)
BUN: 13 mg/dL (ref 6–20)
CHLORIDE: 105 mmol/L (ref 101–111)
CO2: 27 mmol/L (ref 22–32)
CREATININE: 0.5 mg/dL (ref 0.44–1.00)
Calcium: 9.1 mg/dL (ref 8.9–10.3)
GFR calc non Af Amer: >60 mL/min (ref >60)
Glucose, Bld: 106 mg/dL — ABNORMAL HIGH (ref 65–99)
Potassium: 3.8 mmol/L (ref 3.5–5.1)
Sodium: 136 mmol/L (ref 135–145)

## 2015-12-25 MED ORDER — ASPIRIN EC 81 MG PO TBEC: 81.0000 mg | DELAYED_RELEASE_TABLET | Freq: Every day | ORAL | Status: DC

## 2015-12-25 MED ORDER — ACETAMINOPHEN 500 MG PO TABS: 500.0000 mg | ORAL_TABLET | Freq: Four times a day (QID) | ORAL | Status: DC | PRN

## 2015-12-25 NOTE — Plan of Care (Signed)
Discussed discharge plan with [name] and she has coordinated a rotation of family members to be at home to assist her for the next several days. She feels quite comfortable with this plan and states that her family are very supportive in times of illness. She is interested in home PT and also having a walker and wheel chair available at home (case manager checking to see if covered). She states her sister will be available for discharge transportation at 4:30PM this afternoon.

## 2015-12-25 NOTE — Progress Notes (Signed)
Pt ready for d/c home today with family. PT/OT still recommending SNF-however, pt is adamant about going home with her sister and daughter there for assistance. Wheelchair and hemi walker were delivered to pt's room. Discharge instructions and prescriptions reviewed with pt, all her questions were answered.  Belongings packed and will be sent with pt's sister. Her sister will be transporting her home.   Lowella DellHudson, Ninamarie Keel G   12/25/2015

## 2015-12-25 NOTE — Progress Notes (Signed)
Occupational Therapy Treatment Patient Details Name: Oneita JollyLura M Junod MRN: [id number] DOB: [birth year] Today's Date: 12/25/2015    History of present illness Pt is a 75 y/o female who presents s/p fall with R distal radius fx, treated conservatively.   OT comments  Performed entire ADL training with pt so she could appreciate the level of assistance she will require if she returns home. Pt verbalizing understanding that she needs extensive assistance. Continue to recommend SNF for ST rehab.  Follow Up Recommendations  SNF;Supervision/Assistance - 24 hour (HHOT if pt refuses SNF)    Equipment Recommendations  3 in 1 bedside comode    Recommendations for Other Services      Precautions / Restrictions Precautions Precautions: Fall Precaution Comments: R UE sling Required Braces or Orthoses: Sling Restrictions Weight Bearing Restrictions: No Other Position/Activity Restrictions: multiple cues to maintain NWB on R wrist/hand with sit to stand       Mobility Bed Mobility Overal bed mobility: Needs Assistance Bed Mobility: Supine to Sit     Supine to sit: Mod assist;HOB elevated     General bed mobility comments: assist with pad to advance hips to EOB  Transfers Overall transfer level: Needs assistance Equipment used: None;1 person hand held assist Transfers: Sit to/from Stand Sit to Stand: Mod assist Stand pivot transfers: Mod assist       General transfer comment: assist to rise and maintain balance/weight shift as she [facility] and chair, pt with urinary incontinence upon standing and in wet diaper    Balance Overall balance assessment: Needs assistance Sitting-balance support: Feet supported Sitting balance-Leahy Scale: Fair       Standing balance-Leahy Scale: Poor                     ADL Overall ADL's : Needs assistance/impaired     Grooming: Wash/dry hands;Wash/dry face;Sitting;Minimal assistance   Upper Body Bathing: Minimal  assitance;Sitting   Lower Body Bathing: Maximal assistance;Sit to/from stand   Upper Body Dressing : Set up;Sitting   Lower Body Dressing: Total assistance;Sitting/lateral leans Lower Body Dressing Details (indicate cue type and reason): for socks Toilet Transfer: Moderate assistance;Stand-pivot;BSC   Toileting- Clothing Manipulation and Hygiene: Total assistance;Sit to/from stand                Vision                     Perception     Praxis      Cognition   Behavior During Therapy: [facility] for tasks assessed/performed Overall Cognitive Status: Within Functional Limits for tasks assessed                       Extremity/Trunk Assessment               Exercises     Shoulder Instructions       General Comments      Pertinent Vitals/ Pain       Pain Assessment: 0-10 Pain Score: 7  Pain Location: R hip Pain Descriptors / Indicators: Aching;Guarding;Grimacing Pain Intervention(s): Monitored during session;Repositioned  Home Living                                          Prior Functioning/Environment              Frequency Min 3X/week  Progress Toward Goals  OT Goals(current goals can now be found in the care plan section)  Progress towards OT goals: Not progressing toward goals - comment (pain continues to limit)  Acute Rehab OT Goals Patient Stated Goal: Decrease pain Time For Goal Achievement: 12/30/15 Potential to Achieve Goals: Good  Plan Discharge plan remains appropriate    Co-evaluation                 End of Session Equipment Utilized During Treatment: Gait belt   Activity Tolerance Patient limited by pain   Patient Left in chair;with call bell/phone within reach   Nurse Communication Mobility status        Time: 0900-0940 OT Time Calculation (min): 40 min  Charges: OT General Charges $OT Visit: 1 Procedure OT Treatments $Self Care/Home Management : 38-52 mins  Evern BioMayberry,  Tyeson Tanimoto Lynn 12/25/2015, 11:56 AM  [phone]

## 2015-12-25 NOTE — Care Management Note (Signed)
Case Management Note  Patient Details  Name: [name] MRN: [id number] Date of Birth: 09/05/1940  Subjective/Objective:   75 yr old female admitted after a fall with a right distal radius fracture, which is being treated conservatively.            Action/Plan: Case manager spoke with patient concerning home health and DME needs. Patient adamant that she doesn't want to go to SNF for short term rehab.  Choice was offered, referral was called to Josph MachoSusan Hamilton, [facility] Liaison. DME has been ordered. Patient has asked family to assist her at discharge.   Expected Discharge Date:  12/25/15               Expected Discharge Plan:  Home w Home Health Services  In-House Referral:     Discharge planning Services  CM Consult  Post Acute Care Choice:  Durable Medical Equipment, Home Health Choice offered to:  Patient  DME Arranged:  Walker hemi, Wheelchair manual DME Agency:  [facility]  HH Arranged:  PT, OT, Social Work [employer] Agency:  [facility]  Status of Service:  In process, will continue to follow  If discussed at Long Length of Stay Meetings, dates discussed:    Additional Comments:  Durenda GuthrieBrady, Rylah Fukuda Naomi, RN 12/25/2015, 12:06 PM

## 2015-12-25 NOTE — Progress Notes (Signed)
Physical Therapy Treatment Patient Details Name: Carla JollyLura M Cook MRN: [id number] DOB: 12-Apr-1941 Today's Date: 12/25/2015    History of Present Illness Pt is a 75 y/o female who presents s/p fall at home.     PT Comments    Pt performed decreased gait with decreased gait speed.  Pt educated on her deficits and that she is at an increased risk for falls at this time.  PTA highly encouraged patient to reconsider rehab before returning home.  Pt reports she will think on it.  Recommendations remain for rehab at this time.    Follow Up Recommendations  SNF;Supervision/Assistance - 24 hour     Equipment Recommendations  Other (comment) (hemiwalker if patient refuses SNF placement.  )    Recommendations for Other Services       Precautions / Restrictions Precautions Precautions: Fall Precaution Comments: RUE splinted and in sling during session.  Required Braces or Orthoses: Sling Restrictions Weight Bearing Restrictions: No (none ordered maintained NWB during session on RUE.  ) Other Position/Activity Restrictions: Waiting for WB status clarification. Maintained NWB throughout session.     Mobility  Bed Mobility               General bed mobility comments: Pt recieved in recliner on arrival.    Transfers Overall transfer level: Needs assistance Equipment used: Hemi-walker Transfers: Sit to/from Stand Sit to Stand: Mod assist Stand pivot transfers: Mod assist       General transfer comment: Pt required increased trials to achieve standing.  Pt demonstrates posterior lean during transition and requires assist to correct.  Pt in denial about ability.    Ambulation/Gait Ambulation/Gait assistance: Mod assist Ambulation Distance (Feet): 12 Feet (x2 trials then left on commode for BM, greater than 20 min to complete 1st trial.  ) Assistive device: Hemi-walker Gait Pattern/deviations: Step-to pattern;Antalgic;Decreased stride length;Trunk flexed Gait velocity: Decreased    General Gait Details: VC's for sequencing and general safety with the HW, Pt required assist to advance walker, and cueing for improved posture. Pt shuffles feet with poor safety and required assist to and cues to improve foot clearance.     Stairs            Wheelchair Mobility    Modified Rankin (Stroke Patients Only)       Balance     Sitting balance-Leahy Scale: Poor       Standing balance-Leahy Scale: Poor                      Cognition Arousal/Alertness: Awake/alert Behavior During Therapy: WFL for tasks assessed/performed Overall Cognitive Status: Within Functional Limits for tasks assessed                      Exercises      General Comments        Pertinent Vitals/Pain Pain Assessment: 0-10 Pain Score: 6  Pain Location: R hip Pain Descriptors / Indicators: Grimacing;Guarding;Tightness Pain Intervention(s): Monitored during session;Repositioned    Home Living                      Prior Function            PT Goals (current goals can now be found in the care plan section) Acute Rehab PT Goals Patient Stated Goal: Decrease pain Potential to Achieve Goals: Good Progress towards PT goals: Progressing toward goals    Frequency  Min 4X/week    PT Plan  Discharge plan needs to be updated    Co-evaluation             End of Session Equipment Utilized During Treatment: Gait belt;Other (comment) (sling on RUE.  ) Activity Tolerance: Patient limited by fatigue Patient left: in chair;with call bell/phone within reach;with nursing/sitter in room     Time: [id number] PT Time Calculation (min) (ACUTE ONLY): 41 min  Charges:  $Gait Training: 23-37 mins $Therapeutic Activity: 8-22 mins                    G Codes:      [name] 12/25/2015, 10:48 AM  Joycelyn RuaAimee Abelino Tippin, PTA pager [phone]

## 2015-12-25 NOTE — Progress Notes (Signed)
   Subjective: [name] is feeling about the same today. Significant pain in her right shoulder and hip are going, pain scale ranging from 3-7/10. Her hip pain is impairing her mobility and she still requires nursing assistance to stand from sitting and to use the restroom. Upon discussing her potential discharge home she stated that her sister would now possibly be her source of assistance at home and she is interested in PT at home if possible. It remains unclear whether elderly sister is fit for this physically demanding task. SNF may likely be the new discharge plan.  Objective: Vital signs in last 24 hours: Filed Vitals:   12/24/15 0934 12/24/15 1300 12/24/15 2245 12/25/15 0602  BP: 138/83 119/66 151/74 159/86  Pulse: 80  91 77  Temp:  98.8 F (37.1 C) 98.9 F (37.2 C) 99.2 F (37.3 C)  TempSrc:  Oral Oral Oral  Resp:  18 16 16   SpO2:  99% 95% 94%   Physical Exam General appearance: Elderly African American woman sitting comfortably in bed, right arm held in a sling, converstational Cardiovascular: Regular rate and rhythm, normal S1/S2 without murmur, rubs, or gallop Respiratory/Chest: Clear to auscultation, normal work of breathing, right chest wall and should TTP, pain on deep inspiration Extremities: Right wrist in ace wrap, distal pulses/sensation/mobility intact in all extremities, 2+ pitting edema BLE Skin: Warm, dry, intact Neuro: CNs II-XII grossly intact, alert and oriented Psych: Appropriate affect  Labs / Imaging / Procedures:  BMP Latest Ref Rng 12/25/2015 12/24/2015 12/23/2015  Glucose 65 - 99 mg/dL 161(W106(H) 960(A171(H) 540(J155(H)  BUN 6 - 20 mg/dL 13 15 17   Creatinine 0.44 - 1.00 mg/dL 8.110.50 9.140.67 7.820.69  Sodium 135 - 145 mmol/L 136 137 141  Potassium 3.5 - 5.1 mmol/L 3.8 3.2(L) 3.7  Chloride 101 - 111 mmol/L 105 107 108  CO2 22 - 32 mmol/L 27 25 25   Calcium 8.9 - 10.3 mg/dL 9.1 9.1 9.4    Assessment/Plan: [name] is a 75 year old with pertinent PMH of osteopenia, DJD,  CVA, arrythmia, and peripheral neuropathy who presents for pain control of a right radial fracture following a mechanical fall from standing.  1. Fall with R distal radius fracture, minimally displaced, extends to radiocarpal joint space, in the setting of osteoporosis, rib, hip, and shoulder pain - Pain control: tylenol Q6 prn, tramadol 50mg  Q6 prn  - Incentive spirometry to bedside and with discharge for PNA ppx - PT/OT - requires 24/7 assist, eventual DC to SNF vs home with 24/7  assist  - Follow up with Dr. Ophelia CharterYates outpatient   2. Hypokalemia, resolved, K 3.8   3. Osteoporosis, chronic, longstanding vitamin D deficiency - Continue vitamin D and calcium supplementation - Patient agrees to take alendronate upon discharge  4. Chronic conditions/home meds: Cerebrovascular disease, chronic, managed by PCP and Dr. [name]/Neurology  - Continue 75mg  Plavix QD - Plan to discontinue 81mg  Aspirin in two weeks, continue for now Hypertension, controlled, managed by PCP - Atenolol 50mg  QD Hyperlipidemia, controlled - Atorvastatin 80mg  QD Lower extremity edema, chronic - Lasix 20mg  tablet, 2x in AM, 1x in PM Chronic low back pain, secondary to degenerative disc disease - Continue home Tramadol 50mg  Q6 prn  FEN: Normal diet  DVTppx: Lovenox  Dispo: Anticipated discharge in today or tomorrow.  Althia FortsAdam Torie Towle, MD 12/25/2015, 9:54 AM Pager: [phone]

## 2015-12-27 DIAGNOSIS — M8580 Other specified disorders of bone density and structure, unspecified site: Secondary | ICD-10-CM | POA: Diagnosis not present

## 2015-12-27 DIAGNOSIS — Z8673 Personal history of transient ischemic attack (TIA), and cerebral infarction without residual deficits: Secondary | ICD-10-CM | POA: Diagnosis not present

## 2015-12-27 DIAGNOSIS — G629 Polyneuropathy, unspecified: Secondary | ICD-10-CM | POA: Diagnosis not present

## 2015-12-27 DIAGNOSIS — I10 Essential (primary) hypertension: Secondary | ICD-10-CM | POA: Diagnosis not present

## 2015-12-27 DIAGNOSIS — S52501D Unspecified fracture of the lower end of right radius, subsequent encounter for closed fracture with routine healing: Secondary | ICD-10-CM | POA: Diagnosis not present

## 2015-12-27 DIAGNOSIS — E785 Hyperlipidemia, unspecified: Secondary | ICD-10-CM | POA: Diagnosis not present

## 2015-12-29 DIAGNOSIS — Z8673 Personal history of transient ischemic attack (TIA), and cerebral infarction without residual deficits: Secondary | ICD-10-CM | POA: Diagnosis not present

## 2015-12-29 DIAGNOSIS — G629 Polyneuropathy, unspecified: Secondary | ICD-10-CM | POA: Diagnosis not present

## 2015-12-29 DIAGNOSIS — I10 Essential (primary) hypertension: Secondary | ICD-10-CM | POA: Diagnosis not present

## 2015-12-29 DIAGNOSIS — E785 Hyperlipidemia, unspecified: Secondary | ICD-10-CM | POA: Diagnosis not present

## 2015-12-29 DIAGNOSIS — M8580 Other specified disorders of bone density and structure, unspecified site: Secondary | ICD-10-CM | POA: Diagnosis not present

## 2015-12-29 DIAGNOSIS — S52501D Unspecified fracture of the lower end of right radius, subsequent encounter for closed fracture with routine healing: Secondary | ICD-10-CM | POA: Diagnosis not present

## 2015-12-30 DIAGNOSIS — Z8673 Personal history of transient ischemic attack (TIA), and cerebral infarction without residual deficits: Secondary | ICD-10-CM | POA: Diagnosis not present

## 2015-12-30 DIAGNOSIS — S52501D Unspecified fracture of the lower end of right radius, subsequent encounter for closed fracture with routine healing: Secondary | ICD-10-CM | POA: Diagnosis not present

## 2015-12-30 DIAGNOSIS — E785 Hyperlipidemia, unspecified: Secondary | ICD-10-CM | POA: Diagnosis not present

## 2015-12-30 DIAGNOSIS — G629 Polyneuropathy, unspecified: Secondary | ICD-10-CM | POA: Diagnosis not present

## 2015-12-30 DIAGNOSIS — M8580 Other specified disorders of bone density and structure, unspecified site: Secondary | ICD-10-CM | POA: Diagnosis not present

## 2015-12-30 DIAGNOSIS — I10 Essential (primary) hypertension: Secondary | ICD-10-CM | POA: Diagnosis not present

## 2015-12-31 DIAGNOSIS — S52301A Unspecified fracture of shaft of right radius, initial encounter for closed fracture: Secondary | ICD-10-CM | POA: Diagnosis not present

## 2016-01-01 DIAGNOSIS — G629 Polyneuropathy, unspecified: Secondary | ICD-10-CM | POA: Diagnosis not present

## 2016-01-01 DIAGNOSIS — E785 Hyperlipidemia, unspecified: Secondary | ICD-10-CM | POA: Diagnosis not present

## 2016-01-01 DIAGNOSIS — I10 Essential (primary) hypertension: Secondary | ICD-10-CM | POA: Diagnosis not present

## 2016-01-01 DIAGNOSIS — S52501D Unspecified fracture of the lower end of right radius, subsequent encounter for closed fracture with routine healing: Secondary | ICD-10-CM | POA: Diagnosis not present

## 2016-01-01 DIAGNOSIS — M8580 Other specified disorders of bone density and structure, unspecified site: Secondary | ICD-10-CM | POA: Diagnosis not present

## 2016-01-01 DIAGNOSIS — Z8673 Personal history of transient ischemic attack (TIA), and cerebral infarction without residual deficits: Secondary | ICD-10-CM | POA: Diagnosis not present

## 2016-01-02 DIAGNOSIS — G629 Polyneuropathy, unspecified: Secondary | ICD-10-CM | POA: Diagnosis not present

## 2016-01-02 DIAGNOSIS — S52501D Unspecified fracture of the lower end of right radius, subsequent encounter for closed fracture with routine healing: Secondary | ICD-10-CM | POA: Diagnosis not present

## 2016-01-02 DIAGNOSIS — I10 Essential (primary) hypertension: Secondary | ICD-10-CM | POA: Diagnosis not present

## 2016-01-02 DIAGNOSIS — M8580 Other specified disorders of bone density and structure, unspecified site: Secondary | ICD-10-CM | POA: Diagnosis not present

## 2016-01-02 DIAGNOSIS — E785 Hyperlipidemia, unspecified: Secondary | ICD-10-CM | POA: Diagnosis not present

## 2016-01-02 DIAGNOSIS — Z8673 Personal history of transient ischemic attack (TIA), and cerebral infarction without residual deficits: Secondary | ICD-10-CM | POA: Diagnosis not present

## 2016-01-05 ENCOUNTER — Other Ambulatory Visit: Payer: Self-pay | Admitting: Student in an Organized Health Care Education/Training Program

## 2016-01-06 ENCOUNTER — Telehealth: Payer: Self-pay | Admitting: Student in an Organized Health Care Education/Training Program

## 2016-01-06 DIAGNOSIS — Z8673 Personal history of transient ischemic attack (TIA), and cerebral infarction without residual deficits: Secondary | ICD-10-CM | POA: Diagnosis not present

## 2016-01-06 DIAGNOSIS — G629 Polyneuropathy, unspecified: Secondary | ICD-10-CM | POA: Diagnosis not present

## 2016-01-06 DIAGNOSIS — M8580 Other specified disorders of bone density and structure, unspecified site: Secondary | ICD-10-CM | POA: Diagnosis not present

## 2016-01-06 DIAGNOSIS — S52501D Unspecified fracture of the lower end of right radius, subsequent encounter for closed fracture with routine healing: Secondary | ICD-10-CM | POA: Diagnosis not present

## 2016-01-06 DIAGNOSIS — E785 Hyperlipidemia, unspecified: Secondary | ICD-10-CM | POA: Diagnosis not present

## 2016-01-06 DIAGNOSIS — I10 Essential (primary) hypertension: Secondary | ICD-10-CM | POA: Diagnosis not present

## 2016-01-06 NOTE — Telephone Encounter (Signed)
Called robin back, lm for rtc

## 2016-01-06 NOTE — Telephone Encounter (Signed)
[facility]  Please call back.

## 2016-01-06 NOTE — Telephone Encounter (Signed)
Spoke with OT, she states pt is having edema in her legs and states it is painful She also requests HHN, for disease management eval and treat, VO given Called pt she says she is doing well except for edema in thighs, lower legs, ankles and feet, seems lasix isnt working appt made for wed 7/19 [facility] Do you approve of VO for HHN?

## 2016-01-07 ENCOUNTER — Ambulatory Visit (INDEPENDENT_AMBULATORY_CARE_PROVIDER_SITE_OTHER): Payer: Medicare Other | Admitting: Internal Medicine

## 2016-01-07 ENCOUNTER — Encounter: Payer: Self-pay | Admitting: Internal Medicine

## 2016-01-07 VITALS — BP 123/53 | HR 80 | Temp 98.2°F | Ht 67.0 in | Wt 223.4 lb

## 2016-01-07 DIAGNOSIS — S5291XS Unspecified fracture of right forearm, sequela: Secondary | ICD-10-CM

## 2016-01-07 DIAGNOSIS — X58XXXS Exposure to other specified factors, sequela: Secondary | ICD-10-CM

## 2016-01-07 DIAGNOSIS — R609 Edema, unspecified: Secondary | ICD-10-CM

## 2016-01-07 DIAGNOSIS — M810 Age-related osteoporosis without current pathological fracture: Secondary | ICD-10-CM | POA: Diagnosis not present

## 2016-01-07 DIAGNOSIS — I639 Cerebral infarction, unspecified: Secondary | ICD-10-CM

## 2016-01-07 MED ORDER — FUROSEMIDE 20 MG PO TABS: ORAL_TABLET | ORAL | Status: DC

## 2016-01-07 MED ORDER — CALCIUM-VITAMIN D3 600-400 MG-UNIT PO TABS: 1.0000 | ORAL_TABLET | Freq: Two times a day (BID) | ORAL | Status: DC

## 2016-01-07 NOTE — Assessment & Plan Note (Addendum)
Patient has has bilateral lower extremity edema but it worsened since the last 3 days. She denies any dyspnea, chest pain. She has stable 2 pillow orthopnea. She has no PND. Denies any cough.  Last year, it was thought that the lower extremity edema may be medication induced, and so the digoxin and clonidine were stopped and the edema improved after that.  On the echo in 2014, there was grade 2 diastolic dysfunction and elevated pulmonary artery pressures and RA pressure was 40. The most recent echo was done in April because she had an acute right parietal lobe infarct, and at that time, the RA pressure was unable to be estimated.  She currently takes 40 mg of Lasix in Am and 20 mg of lasix in the evening. Her weight has been stable at around 220 lbs, though it was 216 at last office visit and 223 on today's visit.   We recommended that given her increase in edema, that she take 60 mg of Lasix in AM and 60 mg lasix in the evening for the next 1-2 weeks. She will come in 2 weeks if her edema does not improve.   -We can consider adding metolazone for refractory edema, or adding a thiazide diuretic for different mechanism of action -consider doing a sleep study to rule out OSA as a cause of her pulmonary hypertension -advised to wear compression stockings

## 2016-01-07 NOTE — Telephone Encounter (Signed)
Yes. HHN is appropriate. Thank you. And the lasix can probably be increased in her visit today. We will see how much she is taking and how things look.

## 2016-01-07 NOTE — Assessment & Plan Note (Signed)
She follows up with Neurology, and is compliant with the Plavix. Per their recs, aspirin is stopped.

## 2016-01-07 NOTE — Patient Instructions (Signed)
Thank you for your visit today Please take 3 tablets of 20 mg lasix in the morning, and 3 tablets in the evening for a week. Take an additional week if your swelling does not go down. Follow up in 2 weeks if there is no improvement in your swelling. Please start taking the fosamax. Please follow up with Dr. Ophelia CharterYates

## 2016-01-07 NOTE — Assessment & Plan Note (Signed)
She is still not taking the Fosamax.  -Advised the importance of taking weekly fosamax.

## 2016-01-07 NOTE — Assessment & Plan Note (Signed)
She is following up with Dr Ophelia CharterYates for orthopedics.

## 2016-01-07 NOTE — Progress Notes (Signed)
Patient ID: [name], female   DOB: 10-20-1940, 75 y.o.   MRN: [id number]    CC: leg swelling HPI: [name] is a 75 y.o. woman with PMH noted below here for worsening of her leg swelling   Please see Problem List/A&P for the status of the patient's chronic medical problems   Past Medical History  Diagnosis Date  . Hypertension   . Hyperlipemia   . DJD (degenerative joint disease)   . Chronic low back pain   . Abnormal electromyogram (EMG)     Low grade right S1 radiculopathy 7/93.  [name]. Hot flashes   . Rotator cuff syndrome of right shoulder     S/P right shoulder arthroscopic debridement of a massive rotator cuff tear, greater tuberosity and resection of subacromial spur by Dr. [name] 07/07/2005.  [name]. Herniated lumbar intervertebral disc     S/P L4-5 & L5-S1 laminotomy, foraminotomy, and L5-S1 diskectomy by Dr. [name] 11/01/2000.  . Multiple joint pain   . Iron deficiency anemia   . Vitamin D deficiency   . SVT (supraventricular tachycardia) (HCC)     PSVT  . Bilateral leg edema   . Peripheral neuropathy (HCC)   . Osteopenia     A DEXA bone density scan done 02/13/2009 showed a left femur neck young adult T-score of -1.6 (osteopenia), a right femur neck young adult T-score of -1.6 (osteopenia), and an AP spine young adult T-score of 1.3 (normal).  Her FRAX score gave an estimated 10 year probability of 6.6% for major osteoporotic fracture and 0.8% for hip fracture.  . Seasonal allergic rhinitis   . Elevated alkaline phosphatase level     Bone scan 08/2004 showed no evidence of abnormal bony activity..  . Skin tag     Right axillary area.  . Hypokalemia   . Headache(784.0)   . Diastolic dysfunction     a. 08/2012 Echo: EF 60-65%, Gr 2 DD, triv MR, PASP 44mmHg.  . Chest pain     a. reports nl cath in the 1990's.  . Stroke (HCC) 04/2013  . Dysrhythmia   . Distal radius fracture, right 12/22/2015    Review of Systems:  Negative, except as per HPI  Physical  Exam: Filed Vitals:   01/07/16 1441  BP: 123/53  Pulse: 80  Temp: 98.2 F (36.8 C)  TempSrc: Oral  Height: 5\' 7"  (1.702 m)  Weight: 223 lb 6.4 oz (101.334 kg)  SpO2: 99%    General: A&O, in NAD, right arm has a cast , in wheelchair CV: RRR, normal s1, s2, no m/r/g Resp: equal and symmetric breath sounds, no wheezing or crackles  Abdomen: soft, nontender, nondistended, +BS Extremities: pulses intact b/l               Significant bilateral 3+ pitting edema, tender to palpation. Right and left side are more or less similar.              Assessment & Plan:   See encounters tab for problem based medical decision making. Patient seen with Dr. Oswaldo DoneVincent

## 2016-01-08 DIAGNOSIS — I10 Essential (primary) hypertension: Secondary | ICD-10-CM | POA: Diagnosis not present

## 2016-01-08 DIAGNOSIS — G629 Polyneuropathy, unspecified: Secondary | ICD-10-CM | POA: Diagnosis not present

## 2016-01-08 DIAGNOSIS — Z8673 Personal history of transient ischemic attack (TIA), and cerebral infarction without residual deficits: Secondary | ICD-10-CM | POA: Diagnosis not present

## 2016-01-08 DIAGNOSIS — E785 Hyperlipidemia, unspecified: Secondary | ICD-10-CM | POA: Diagnosis not present

## 2016-01-08 DIAGNOSIS — S52501D Unspecified fracture of the lower end of right radius, subsequent encounter for closed fracture with routine healing: Secondary | ICD-10-CM | POA: Diagnosis not present

## 2016-01-08 DIAGNOSIS — M8580 Other specified disorders of bone density and structure, unspecified site: Secondary | ICD-10-CM | POA: Diagnosis not present

## 2016-01-08 NOTE — Progress Notes (Signed)
Internal Medicine Clinic Attending  I saw and evaluated the patient.  I personally confirmed the key portions of the history and exam documented by Dr. Johnny BridgeSaraiya and I reviewed pertinent patient test results.  The assessment, diagnosis, and plan were formulated together and I agree with the documentation in the resident's note.  Patient with HFpEF and recent hospitalization for a radial fracture admitted for bilateral LE edema that is symmetric. I doubt DVT, she has a history of LE edema due to HFpEF and possible pulmonary hypertension seen on echo. Plan to increase lasix to 60mg  bid for one week and encouraged compression stockings.

## 2016-01-09 ENCOUNTER — Ambulatory Visit: Payer: Self-pay

## 2016-01-12 DIAGNOSIS — G629 Polyneuropathy, unspecified: Secondary | ICD-10-CM | POA: Diagnosis not present

## 2016-01-12 DIAGNOSIS — S52501D Unspecified fracture of the lower end of right radius, subsequent encounter for closed fracture with routine healing: Secondary | ICD-10-CM | POA: Diagnosis not present

## 2016-01-12 DIAGNOSIS — I10 Essential (primary) hypertension: Secondary | ICD-10-CM | POA: Diagnosis not present

## 2016-01-12 DIAGNOSIS — M8580 Other specified disorders of bone density and structure, unspecified site: Secondary | ICD-10-CM | POA: Diagnosis not present

## 2016-01-12 DIAGNOSIS — Z8673 Personal history of transient ischemic attack (TIA), and cerebral infarction without residual deficits: Secondary | ICD-10-CM | POA: Diagnosis not present

## 2016-01-12 DIAGNOSIS — E785 Hyperlipidemia, unspecified: Secondary | ICD-10-CM | POA: Diagnosis not present

## 2016-01-13 ENCOUNTER — Telehealth: Payer: Self-pay | Admitting: Student in an Organized Health Care Education/Training Program

## 2016-01-13 DIAGNOSIS — G629 Polyneuropathy, unspecified: Secondary | ICD-10-CM | POA: Diagnosis not present

## 2016-01-13 DIAGNOSIS — E785 Hyperlipidemia, unspecified: Secondary | ICD-10-CM | POA: Diagnosis not present

## 2016-01-13 DIAGNOSIS — M8580 Other specified disorders of bone density and structure, unspecified site: Secondary | ICD-10-CM | POA: Diagnosis not present

## 2016-01-13 DIAGNOSIS — Z8673 Personal history of transient ischemic attack (TIA), and cerebral infarction without residual deficits: Secondary | ICD-10-CM | POA: Diagnosis not present

## 2016-01-13 DIAGNOSIS — I10 Essential (primary) hypertension: Secondary | ICD-10-CM | POA: Diagnosis not present

## 2016-01-13 DIAGNOSIS — S52501D Unspecified fracture of the lower end of right radius, subsequent encounter for closed fracture with routine healing: Secondary | ICD-10-CM | POA: Diagnosis not present

## 2016-01-13 NOTE — Telephone Encounter (Signed)
Would like to talk to the nurse about a problem.

## 2016-01-13 NOTE — Telephone Encounter (Signed)
Pt calls and states one of her daughters that helps her is going to have some procedures for arthritis on 8/3 and will not be able to help pt for awhile, she was wondering if there are some resources available to her during this time due to daughter's health issues. Please call her at [phone]

## 2016-01-14 DIAGNOSIS — G629 Polyneuropathy, unspecified: Secondary | ICD-10-CM | POA: Diagnosis not present

## 2016-01-14 DIAGNOSIS — M8580 Other specified disorders of bone density and structure, unspecified site: Secondary | ICD-10-CM | POA: Diagnosis not present

## 2016-01-14 DIAGNOSIS — E785 Hyperlipidemia, unspecified: Secondary | ICD-10-CM | POA: Diagnosis not present

## 2016-01-14 DIAGNOSIS — Z8673 Personal history of transient ischemic attack (TIA), and cerebral infarction without residual deficits: Secondary | ICD-10-CM | POA: Diagnosis not present

## 2016-01-14 DIAGNOSIS — S52501D Unspecified fracture of the lower end of right radius, subsequent encounter for closed fracture with routine healing: Secondary | ICD-10-CM | POA: Diagnosis not present

## 2016-01-14 DIAGNOSIS — I10 Essential (primary) hypertension: Secondary | ICD-10-CM | POA: Diagnosis not present

## 2016-01-14 NOTE — Telephone Encounter (Signed)
CSW placed call to Ms. Lasorsa.  Pt's daughter answered and provided information on the assistance both daughters are providing at this time.  Daughter states she will not be available to assist pt for several weeks due to an upcoming medical issue.  Ms. Mclanahan finished with her [facility] service and was able to speak with CSW directly.  CSW explained the request for PCS application process.  Pt is aware CSW will initiate PCS request and forward to PCP for review.

## 2016-01-16 ENCOUNTER — Telehealth: Payer: Self-pay

## 2016-01-16 NOTE — Telephone Encounter (Signed)
VO order given for PT 2x per week for 1 week. Requesting faxed order for outpatient PT at [facility] for gait training balance and strengthening after fall. Thanks!

## 2016-01-16 NOTE — Telephone Encounter (Signed)
Kendra from [facility] requesting VO. Please call back. 

## 2016-01-19 DIAGNOSIS — Z8673 Personal history of transient ischemic attack (TIA), and cerebral infarction without residual deficits: Secondary | ICD-10-CM | POA: Diagnosis not present

## 2016-01-19 DIAGNOSIS — E785 Hyperlipidemia, unspecified: Secondary | ICD-10-CM | POA: Diagnosis not present

## 2016-01-19 DIAGNOSIS — G629 Polyneuropathy, unspecified: Secondary | ICD-10-CM | POA: Diagnosis not present

## 2016-01-19 DIAGNOSIS — M8580 Other specified disorders of bone density and structure, unspecified site: Secondary | ICD-10-CM | POA: Diagnosis not present

## 2016-01-19 DIAGNOSIS — I10 Essential (primary) hypertension: Secondary | ICD-10-CM | POA: Diagnosis not present

## 2016-01-19 DIAGNOSIS — S52501D Unspecified fracture of the lower end of right radius, subsequent encounter for closed fracture with routine healing: Secondary | ICD-10-CM | POA: Diagnosis not present

## 2016-01-19 NOTE — Telephone Encounter (Signed)
Thank you. Yes, [name] was just admitted after a fall causing a wrist fracture. She needs outpatient PT for gait training. I will forward this to [name] who [name] tells me can help [place] fax the correct order. Thank you!

## 2016-01-20 NOTE — Addendum Note (Signed)
Addended by: Neomia Dear on: 01/20/2016 09:21 AM   Modules accepted: Orders

## 2016-01-21 DIAGNOSIS — Z8673 Personal history of transient ischemic attack (TIA), and cerebral infarction without residual deficits: Secondary | ICD-10-CM | POA: Diagnosis not present

## 2016-01-21 DIAGNOSIS — E785 Hyperlipidemia, unspecified: Secondary | ICD-10-CM | POA: Diagnosis not present

## 2016-01-21 DIAGNOSIS — I10 Essential (primary) hypertension: Secondary | ICD-10-CM | POA: Diagnosis not present

## 2016-01-21 DIAGNOSIS — S52501D Unspecified fracture of the lower end of right radius, subsequent encounter for closed fracture with routine healing: Secondary | ICD-10-CM | POA: Diagnosis not present

## 2016-01-21 DIAGNOSIS — G629 Polyneuropathy, unspecified: Secondary | ICD-10-CM | POA: Diagnosis not present

## 2016-01-21 DIAGNOSIS — M8580 Other specified disorders of bone density and structure, unspecified site: Secondary | ICD-10-CM | POA: Diagnosis not present

## 2016-01-21 NOTE — Telephone Encounter (Signed)
HH PT calls and states that the pt due to mobility issues outside the home is not going to be a candidate for OP PT services, pt will work on these at home with assistance

## 2016-01-22 ENCOUNTER — Ambulatory Visit: Payer: Self-pay | Admitting: Neurology

## 2016-01-22 DIAGNOSIS — E785 Hyperlipidemia, unspecified: Secondary | ICD-10-CM | POA: Diagnosis not present

## 2016-01-22 DIAGNOSIS — M8580 Other specified disorders of bone density and structure, unspecified site: Secondary | ICD-10-CM | POA: Diagnosis not present

## 2016-01-22 DIAGNOSIS — Z8673 Personal history of transient ischemic attack (TIA), and cerebral infarction without residual deficits: Secondary | ICD-10-CM | POA: Diagnosis not present

## 2016-01-22 DIAGNOSIS — I10 Essential (primary) hypertension: Secondary | ICD-10-CM | POA: Diagnosis not present

## 2016-01-22 DIAGNOSIS — S52501D Unspecified fracture of the lower end of right radius, subsequent encounter for closed fracture with routine healing: Secondary | ICD-10-CM | POA: Diagnosis not present

## 2016-01-22 DIAGNOSIS — G629 Polyneuropathy, unspecified: Secondary | ICD-10-CM | POA: Diagnosis not present

## 2016-01-27 DIAGNOSIS — G629 Polyneuropathy, unspecified: Secondary | ICD-10-CM | POA: Diagnosis not present

## 2016-01-27 DIAGNOSIS — M8580 Other specified disorders of bone density and structure, unspecified site: Secondary | ICD-10-CM | POA: Diagnosis not present

## 2016-01-27 DIAGNOSIS — I10 Essential (primary) hypertension: Secondary | ICD-10-CM | POA: Diagnosis not present

## 2016-01-27 DIAGNOSIS — S52501D Unspecified fracture of the lower end of right radius, subsequent encounter for closed fracture with routine healing: Secondary | ICD-10-CM | POA: Diagnosis not present

## 2016-01-27 DIAGNOSIS — Z8673 Personal history of transient ischemic attack (TIA), and cerebral infarction without residual deficits: Secondary | ICD-10-CM | POA: Diagnosis not present

## 2016-01-27 DIAGNOSIS — E785 Hyperlipidemia, unspecified: Secondary | ICD-10-CM | POA: Diagnosis not present

## 2016-01-28 DIAGNOSIS — M25531 Pain in right wrist: Secondary | ICD-10-CM | POA: Diagnosis not present

## 2016-01-30 ENCOUNTER — Telehealth: Payer: Self-pay | Admitting: Student in an Organized Health Care Education/Training Program

## 2016-01-30 NOTE — Telephone Encounter (Signed)
Called pt, she is concerned about her edema, states that dr Ophelia Charteryates was also concerned, [facility]

## 2016-01-30 NOTE — Telephone Encounter (Signed)
Pt called to report she is taking her Fosamax and would like to know if she needs to be seen before the 02/16/2016 her (PCP appt) concerning her fluid meds.

## 2016-02-05 DIAGNOSIS — Z8673 Personal history of transient ischemic attack (TIA), and cerebral infarction without residual deficits: Secondary | ICD-10-CM | POA: Diagnosis not present

## 2016-02-05 DIAGNOSIS — G629 Polyneuropathy, unspecified: Secondary | ICD-10-CM | POA: Diagnosis not present

## 2016-02-05 DIAGNOSIS — S52501D Unspecified fracture of the lower end of right radius, subsequent encounter for closed fracture with routine healing: Secondary | ICD-10-CM | POA: Diagnosis not present

## 2016-02-05 DIAGNOSIS — I10 Essential (primary) hypertension: Secondary | ICD-10-CM | POA: Diagnosis not present

## 2016-02-05 DIAGNOSIS — E785 Hyperlipidemia, unspecified: Secondary | ICD-10-CM | POA: Diagnosis not present

## 2016-02-05 DIAGNOSIS — M8580 Other specified disorders of bone density and structure, unspecified site: Secondary | ICD-10-CM | POA: Diagnosis not present

## 2016-02-06 ENCOUNTER — Ambulatory Visit (INDEPENDENT_AMBULATORY_CARE_PROVIDER_SITE_OTHER): Payer: Medicare Other | Admitting: Internal Medicine

## 2016-02-06 VITALS — BP 112/69 | HR 76 | Temp 98.7°F | Wt 210.0 lb

## 2016-02-06 DIAGNOSIS — I5032 Chronic diastolic (congestive) heart failure: Secondary | ICD-10-CM

## 2016-02-06 DIAGNOSIS — R600 Localized edema: Secondary | ICD-10-CM | POA: Diagnosis not present

## 2016-02-06 NOTE — Progress Notes (Signed)
CC: Follow-up for chronic bilateral lower leg swelling  HPI:  [name] is a 75 y.o. female with past medical history noted below presents to [facility] for follow-up of her chronic bilateral lower leg swelling.  On previous office visit on 01/07/2016 she was told to increase her Lasix to 60 mg in the morning and 60 mg at night. And return to her original home dose after 1-2 weeks.   At today's visit she stated she was still taking 60mg  in the morning and 60mg  at night and has for the past 4 weeks. She states that her swelling has improved as she is able to put on her shoes that were previously too tight on her. She states she is able to walk around and denies any pain or open sores on her legs. She has tried compression stockings 4 times in the last 4 weeks but is unable to wear them because they are too tight.   Past Medical History:  Diagnosis Date  . Abnormal electromyogram (EMG)    Low grade right S1 radiculopathy 7/93.  . Bilateral leg edema   . Chest pain    a. reports nl cath in the 1990's.  . Chronic low back pain   . Diastolic dysfunction    a. 08/2012 Echo: EF 60-65%, Gr 2 DD, triv MR, PASP 44mmHg.  [name]. Distal radius fracture, right 12/22/2015  . DJD (degenerative joint disease)   . Dysrhythmia   . Elevated alkaline phosphatase level    Bone scan 08/2004 showed no evidence of abnormal bony activity..  . Headache(784.0)   . Herniated lumbar intervertebral disc    S/P L4-5 & L5-S1 laminotomy, foraminotomy, and L5-S1 diskectomy by Dr. [name] 11/01/2000.  [name]. Hot flashes   . Hyperlipemia   . Hypertension   . Hypokalemia   . Iron deficiency anemia   . Multiple joint pain   . Osteopenia    A DEXA bone density scan done 02/13/2009 showed a left femur neck young adult T-score of -1.6 (osteopenia), a right femur neck young adult T-score of -1.6 (osteopenia), and an AP spine young adult T-score of 1.3 (normal).  Her FRAX score gave an estimated 10 year probability of 6.6% for  major osteoporotic fracture and 0.8% for hip fracture.  . Peripheral neuropathy (HCC)   . Rotator cuff syndrome of right shoulder    S/P right shoulder arthroscopic debridement of a massive rotator cuff tear, greater tuberosity and resection of subacromial spur by Dr. [name] 07/07/2005.  . Seasonal allergic rhinitis   . Skin tag    Right axillary area.  . Stroke (HCC) 04/2013  . SVT (supraventricular tachycardia) (HCC)    PSVT  . Vitamin D deficiency     Review of Systems:  Review of Systems  Eyes: Negative for blurred vision.  Respiratory: Negative for shortness of breath.   Cardiovascular: Negative for chest pain.  Gastrointestinal: Negative for abdominal pain.  Neurological: Negative for dizziness and headaches.     Physical Exam:  Vitals:   02/06/16 1400  BP: 112/69  Pulse: 76  Temp: 98.7 F (37.1 C)  SpO2: 99%  Weight: 210 lb (95.3 kg)   Physical Exam  Constitutional: She is well-developed, well-nourished, and in no distress.  HENT:  Head: Normocephalic and atraumatic.  Cardiovascular: Normal rate, regular rhythm and normal heart sounds.   Pulmonary/Chest: Effort normal and breath sounds normal.  Abdominal: Soft. There is no tenderness.  Musculoskeletal:  Bilateral lower extremity edema starting 2  inches above ankles with trace pitting  No skin changes noted  Skin: Skin is warm and dry. No rash noted. No erythema.    Assessment & Plan:   See encounters tab for problem based medical decision making.   Patient seen with Dr. Oswaldo DoneVincent

## 2016-02-06 NOTE — Patient Instructions (Addendum)
[name],  It was a pleasure meeting you today   We made a change to your medications  Please start taking furosemide 20 mg 2 times in the morning and 2 times in the afternoon  Please start wearing your new compression stockings  Please call the clinic with any concerns  And we will see you at the end of the month for your regular health visit.

## 2016-02-06 NOTE — Assessment & Plan Note (Addendum)
Assessment: Chronic bilateral lower extremity swelling  Patient has increased her Lasix since last visit to 60 mg in the morning and 60 mg at night.  Her weight today is 210 and previously was 223. She states that her shoes fit better and she's noticed a mild improvement.  She denies SOB or dizziness.  She has only tried compression stockings four times stating they are too tight.  She does not have a problem putting them on but due to the tightness she is uncomfortable and does not like to wear them.      Plan: -Decrease Lasix to 40 mg in the morning and 40 mg at night -Compression stockings given in office with appropriate measurements. -Has follow-up visit at the end of the month

## 2016-02-10 NOTE — Progress Notes (Signed)
Internal Medicine Clinic Attending  I saw and evaluated the patient.  I personally confirmed the key portions of the history and exam documented by Dr. Hoffman and I reviewed pertinent patient test results.  The assessment, diagnosis, and plan were formulated together and I agree with the documentation in the resident's note.      

## 2016-02-11 DIAGNOSIS — M79671 Pain in right foot: Secondary | ICD-10-CM | POA: Diagnosis not present

## 2016-02-11 DIAGNOSIS — S52301D Unspecified fracture of shaft of right radius, subsequent encounter for closed fracture with routine healing: Secondary | ICD-10-CM | POA: Diagnosis not present

## 2016-02-11 DIAGNOSIS — M25551 Pain in right hip: Secondary | ICD-10-CM | POA: Diagnosis not present

## 2016-02-12 ENCOUNTER — Telehealth: Payer: Self-pay

## 2016-02-12 DIAGNOSIS — Z8673 Personal history of transient ischemic attack (TIA), and cerebral infarction without residual deficits: Secondary | ICD-10-CM | POA: Diagnosis not present

## 2016-02-12 DIAGNOSIS — S52501D Unspecified fracture of the lower end of right radius, subsequent encounter for closed fracture with routine healing: Secondary | ICD-10-CM | POA: Diagnosis not present

## 2016-02-12 DIAGNOSIS — I10 Essential (primary) hypertension: Secondary | ICD-10-CM | POA: Diagnosis not present

## 2016-02-12 DIAGNOSIS — E785 Hyperlipidemia, unspecified: Secondary | ICD-10-CM | POA: Diagnosis not present

## 2016-02-12 DIAGNOSIS — M8580 Other specified disorders of bone density and structure, unspecified site: Secondary | ICD-10-CM | POA: Diagnosis not present

## 2016-02-12 DIAGNOSIS — G629 Polyneuropathy, unspecified: Secondary | ICD-10-CM | POA: Diagnosis not present

## 2016-02-12 NOTE — Telephone Encounter (Signed)
I will send message to her doctor.

## 2016-02-12 NOTE — Telephone Encounter (Signed)
Beth from [facility] states pt is discharge from Home care, and met goal.

## 2016-02-16 ENCOUNTER — Ambulatory Visit: Payer: Self-pay | Admitting: Student in an Organized Health Care Education/Training Program

## 2016-03-22 ENCOUNTER — Other Ambulatory Visit: Payer: Self-pay | Admitting: Student in an Organized Health Care Education/Training Program

## 2016-03-23 DIAGNOSIS — M545 Low back pain: Secondary | ICD-10-CM | POA: Diagnosis not present

## 2016-03-23 DIAGNOSIS — M1711 Unilateral primary osteoarthritis, right knee: Secondary | ICD-10-CM | POA: Diagnosis not present

## 2016-03-28 ENCOUNTER — Other Ambulatory Visit: Payer: Self-pay | Admitting: Student in an Organized Health Care Education/Training Program

## 2016-03-29 ENCOUNTER — Encounter: Payer: Self-pay | Admitting: Student in an Organized Health Care Education/Training Program

## 2016-03-29 ENCOUNTER — Ambulatory Visit (INDEPENDENT_AMBULATORY_CARE_PROVIDER_SITE_OTHER): Payer: Medicare Other | Admitting: Student in an Organized Health Care Education/Training Program

## 2016-03-29 VITALS — BP 144/67 | HR 74 | Temp 97.9°F | Ht 67.0 in | Wt 209.6 lb

## 2016-03-29 DIAGNOSIS — M47816 Spondylosis without myelopathy or radiculopathy, lumbar region: Secondary | ICD-10-CM

## 2016-03-29 DIAGNOSIS — I5032 Chronic diastolic (congestive) heart failure: Secondary | ICD-10-CM | POA: Diagnosis not present

## 2016-03-29 DIAGNOSIS — R600 Localized edema: Secondary | ICD-10-CM

## 2016-03-29 DIAGNOSIS — M545 Low back pain, unspecified: Secondary | ICD-10-CM

## 2016-03-29 DIAGNOSIS — M80831D Other osteoporosis with current pathological fracture, right forearm, subsequent encounter for fracture with routine healing: Secondary | ICD-10-CM

## 2016-03-29 DIAGNOSIS — E785 Hyperlipidemia, unspecified: Secondary | ICD-10-CM

## 2016-03-29 DIAGNOSIS — Z8673 Personal history of transient ischemic attack (TIA), and cerebral infarction without residual deficits: Secondary | ICD-10-CM

## 2016-03-29 DIAGNOSIS — Z79899 Other long term (current) drug therapy: Secondary | ICD-10-CM

## 2016-03-29 DIAGNOSIS — Z23 Encounter for immunization: Secondary | ICD-10-CM | POA: Diagnosis not present

## 2016-03-29 DIAGNOSIS — E669 Obesity, unspecified: Secondary | ICD-10-CM

## 2016-03-29 DIAGNOSIS — M810 Age-related osteoporosis without current pathological fracture: Secondary | ICD-10-CM

## 2016-03-29 DIAGNOSIS — E7800 Pure hypercholesterolemia, unspecified: Secondary | ICD-10-CM

## 2016-03-29 DIAGNOSIS — Z6832 Body mass index (BMI) 32.0-32.9, adult: Secondary | ICD-10-CM

## 2016-03-29 DIAGNOSIS — G8929 Other chronic pain: Secondary | ICD-10-CM

## 2016-03-29 DIAGNOSIS — Z79891 Long term (current) use of opiate analgesic: Secondary | ICD-10-CM

## 2016-03-29 MED ORDER — FUROSEMIDE 40 MG PO TABS: 40.0000 mg | ORAL_TABLET | Freq: Two times a day (BID) | ORAL | 3 refills | Status: DC

## 2016-03-29 NOTE — Assessment & Plan Note (Signed)
Symptomatically stable bilateral pitting edema. This is likely multifactorial due to chronic heart failure with preserved ejection fraction and lymphedema. Her weight is stable at 209 pounds. Plan is to continue furosemide 40 mg twice a day with a small potassium supplements. She also can continue compression stockings for at least 8 hours every day.

## 2016-03-29 NOTE — Assessment & Plan Note (Signed)
Due to osteoarthritis and obesity, symptoms are currently stable. She has a fairly good functional status, she is independent in most of her activities of daily living. In the past we've used tramadol for this. She follows with orthopedics and was started on meloxicam. I advised her to use this only on the bad days which she reports her about 3 days weekly. I told her when she runs out of this she continues ibuprofen. Given her history of ischemic cerebral disease I am a little worried about continuing selective Cox 1 inhibitors.

## 2016-03-29 NOTE — Progress Notes (Signed)
Assessment and Plan:  See Encounters tab for problem-based medical decision making.   __________________________________________________________  HPI:  75 year old woman here for follow-up of bilateral lower extremity edema. 2 months ago we had her fitted for compression stockings which she wore for several weeks and found good benefit. One day she said that she had very significant swelling and had difficulty getting the stockings off, had to cut them off. She did take her prescription to a stocking company and is now ordering them from the manufacturer for about $16 per pain. She wears them for several hours each day, takes them on and off independently. She reports good compliance with her other medications including furosemide. Denies any adverse side effects. Eating and drinking well. Still independent in all her activities of daily living. She lives on her own but [place]. Her right radius fracture is healing well, no falls recently at home. Walks around with the assistance of a cane outside the house and with a rolling walker inside her house.   __________________________________________________________  Problem List: Patient Active Problem List   Diagnosis Date Noted  . Osteoporosis 04/03/2009    Priority: High  . Bilateral leg edema due to diastolic dysfunction 02/22/2007    Priority: High  . Chronic diastolic (congestive) heart failure 09/28/2015    Priority: Medium  . Risk for falls 03/03/2015    Priority: Medium  . Essential hypertension 09/14/2006    Priority: Medium  . Hyperlipidemia 04/27/2006    Priority: Medium  . Osteoarthritis 04/27/2006    Priority: Medium  . Lower back pain 04/25/2015    Priority: Low  . Healthcare maintenance 04/08/2014    Priority: Low  . Vitamin D deficiency 05/08/2010    Priority: Low    Medications: Reconciled today in Epic __________________________________________________________  Physical Exam:    Vital Signs: Vitals:   03/29/16 0958  BP: (!) 144/67  Pulse: 74  Temp: 97.9 F (36.6 C)  TempSrc: Oral  SpO2: 100%  Weight: 209 lb 9.6 oz (95.1 kg)  Height: 5\' 7"  (1.702 m)    Gen: Well appearing CV: RRR, no murmurs Pulm: Normal effort, CTA throughout, no wheezing Abd: Soft, NT, ND, normal BS.  Ext: Warm, 2+ pitting edema bilaterally Skin: No atypical appearing moles. No rashes

## 2016-03-29 NOTE — Patient Instructions (Signed)
1. You are doing great at home. Use your rolling walker when leaving the house, it is more stable than a cane. Let me know if you need any new orders for [facility].   2. Use your medicines as prescribed on this list.   3. We gave you a flu shot today.   4. Use your compression stocking every day for at least 8 hours.

## 2016-03-29 NOTE — Assessment & Plan Note (Addendum)
Patient with long history of hyperlipidemia, chronic small vessel disease, had a acute CVA in April 2017. She was on aspirin and Plavix for 3 month. Current plan is to continue with Plavix alone for secondary prevention. Plan to also continue atorvastatin 80 mg daily.

## 2016-03-29 NOTE — Assessment & Plan Note (Signed)
Diagnosed based on fragility fracture in July of the right radius after a fall from standing. She is doing well with alendronate 70 mg once weekly which we are going to continue. We talked about fall prevention, I encouraged her to use the rolling walker that I ordered several months ago.

## 2016-04-02 NOTE — Telephone Encounter (Signed)
A user error has taken place: encounter opened in error, closed for administrative reasons.

## 2016-04-13 ENCOUNTER — Other Ambulatory Visit: Payer: Self-pay | Admitting: Internal Medicine

## 2016-04-13 NOTE — Telephone Encounter (Signed)
Refill request from pt's pharmacy-furosemide 40mg  rx dated on 10/9 #180 with 3 refills was never sent.  Order class was "no print", therefore pharmacy never received info.  Will send info to pcp for signaure.[name], Carla Lomas Cassady10/24/201711:09 AM

## 2016-05-03 ENCOUNTER — Other Ambulatory Visit: Payer: Self-pay | Admitting: *Deleted

## 2016-05-03 MED ORDER — CLOPIDOGREL BISULFATE 75 MG PO TABS: 75.0000 mg | ORAL_TABLET | Freq: Every day | ORAL | 3 refills | Status: DC

## 2016-06-03 DIAGNOSIS — M1711 Unilateral primary osteoarthritis, right knee: Secondary | ICD-10-CM | POA: Diagnosis not present

## 2016-06-03 DIAGNOSIS — M1712 Unilateral primary osteoarthritis, left knee: Secondary | ICD-10-CM | POA: Diagnosis not present

## 2016-06-03 DIAGNOSIS — M461 Sacroiliitis, not elsewhere classified: Secondary | ICD-10-CM | POA: Diagnosis not present

## 2016-06-28 ENCOUNTER — Encounter (INDEPENDENT_AMBULATORY_CARE_PROVIDER_SITE_OTHER): Payer: Self-pay

## 2016-06-28 ENCOUNTER — Ambulatory Visit (INDEPENDENT_AMBULATORY_CARE_PROVIDER_SITE_OTHER): Payer: Medicare Other | Admitting: Student in an Organized Health Care Education/Training Program

## 2016-06-28 ENCOUNTER — Encounter: Payer: Self-pay | Admitting: Student in an Organized Health Care Education/Training Program

## 2016-06-28 ENCOUNTER — Other Ambulatory Visit: Payer: Self-pay | Admitting: Student in an Organized Health Care Education/Training Program

## 2016-06-28 VITALS — BP 136/78 | HR 77 | Temp 98.1°F | Wt 208.9 lb

## 2016-06-28 DIAGNOSIS — R600 Localized edema: Secondary | ICD-10-CM

## 2016-06-28 DIAGNOSIS — Z79899 Other long term (current) drug therapy: Secondary | ICD-10-CM

## 2016-06-28 DIAGNOSIS — M545 Low back pain: Secondary | ICD-10-CM

## 2016-06-28 DIAGNOSIS — I110 Hypertensive heart disease with heart failure: Secondary | ICD-10-CM

## 2016-06-28 DIAGNOSIS — I10 Essential (primary) hypertension: Secondary | ICD-10-CM

## 2016-06-28 DIAGNOSIS — Z0000 Encounter for general adult medical examination without abnormal findings: Secondary | ICD-10-CM

## 2016-06-28 DIAGNOSIS — M170 Bilateral primary osteoarthritis of knee: Secondary | ICD-10-CM | POA: Diagnosis not present

## 2016-06-28 DIAGNOSIS — M810 Age-related osteoporosis without current pathological fracture: Secondary | ICD-10-CM

## 2016-06-28 DIAGNOSIS — I5032 Chronic diastolic (congestive) heart failure: Secondary | ICD-10-CM

## 2016-06-28 DIAGNOSIS — G8929 Other chronic pain: Secondary | ICD-10-CM

## 2016-06-28 DIAGNOSIS — M1711 Unilateral primary osteoarthritis, right knee: Secondary | ICD-10-CM

## 2016-06-28 DIAGNOSIS — Z87891 Personal history of nicotine dependence: Secondary | ICD-10-CM

## 2016-06-28 DIAGNOSIS — Z87310 Personal history of (healed) osteoporosis fracture: Secondary | ICD-10-CM

## 2016-06-28 MED ORDER — FUROSEMIDE 20 MG PO TABS: 20.0000 mg | ORAL_TABLET | Freq: Two times a day (BID) | ORAL | 3 refills | Status: DC

## 2016-06-28 NOTE — Assessment & Plan Note (Signed)
Patient with heart failure with preserved ejection fraction for many years. Last echo April 2017. Weight today is stable at 208 pounds. She continues to have chronic lower extremity edema. Plan to continue management with furosemide 20 mg twice daily. Also continue atenolol 50 mg daily.

## 2016-06-28 NOTE — Progress Notes (Signed)
Assessment and Plan:  See Encounters tab for problem-based medical decision making.   __________________________________________________________  HPI:  76 year old woman here for follow-up of bilateral lower extremity edema. This is been a problem for her for many years. It's multifactorial due to heart failure with preserved ejection fraction, lymphedema, and severe osteoarthritis of both of her knees. Her edema in the past been much worse, when I first met her she is unable to tie her shoelaces. She's been doing well with diuresis and compression stockings daily. We went over her medications carefully. She wears the stockings only for about 8 hours a day and she is able to take them on and off independently.  Last 2 months she's been struggling with her older daughter who has been admitted to [facility] because of complications of rheumatoid arthritis. She tells me she was recently moved to [facility] intensive care unit after a significant surgery. Ms. Pfluger is hopeful that her daughter will improve soon, but clearly this has been a rough couple of weeks for her.  She reports good compliance with her other medications. No adverse side effects. Tolerating alendronate well. No chest pain, DOE, fever, chills, or dizziness. Eating and drinking well. Exercising some, but very limited by fragility, high fall risk, bilateral knee pain, and lower back pain.  __________________________________________________________  Problem List: Patient Active Problem List   Diagnosis Date Noted  . Osteoporosis 04/03/2009    Priority: High  . Bilateral leg edema due to diastolic dysfunction 51/88/4166    Priority: High  . Chronic diastolic congestive heart failure ([place]) 09/28/2015    Priority: Medium  . Risk for falls 03/03/2015    Priority: Medium  . Essential hypertension 09/14/2006    Priority: Medium  . Hyperlipidemia 04/27/2006    Priority: Medium  . Osteoarthritis 04/27/2006    Priority: Medium    . Lower back pain 04/25/2015    Priority: Low  . Healthcare maintenance 04/08/2014    Priority: Low  . Vitamin D deficiency 05/08/2010    Priority: Low    Medications: Reconciled today in Epic __________________________________________________________  Physical Exam:  Vital Signs: Vitals:   06/28/16 0938 06/28/16 1005  BP: (!) 146/75 136/78  Pulse: 77   Temp: 98.1 F (36.7 C)   TempSrc: Oral   SpO2: 100%   Weight: 208 lb 14.4 oz (94.8 kg)     Gen: Well appearing, NAD Neck: No cervical LAD, No thyromegaly or nodules, No JVD. CV: RRR, 1/6 early systolic murmur at the RUSB Pulm: Normal effort, CTA throughout, no wheezing Abd: Soft, NT, ND, normal BS.  Ext: Warm, 2+ bilateral edema, enlarged left and right knees with minimal effusion felt. Skin: No atypical appearing moles. No rashes

## 2016-06-28 NOTE — Assessment & Plan Note (Signed)
Diagnosis fragility fracture of the right radius in July 2017. She is tolerating alendronate 70 mg once weekly well. No GI side effects. Plan for 5 years of treatment.

## 2016-06-28 NOTE — Assessment & Plan Note (Signed)
Presented to clinic wearing a back brace. We talked about the utility of these, there is very poor evidence for them, it seems like they may even be more harm than benefit. I advised against the back brace. She does feel like it helps with her posture. I told her to try with and without. To be mindful that I do want her to maintain good core strength. She's going to see a she does and will let me know.

## 2016-06-28 NOTE — Assessment & Plan Note (Signed)
Multifactorial due to heart failure with preserved ejection fraction and lymphedema. She also has significant osteoarthritis of both of her knees. It is well-controlled with current medical managements as well as compression stockings daily. I provided her with a paper prescription for stockings, she found a supply company that will provide her measured stockings for $6 a pair.

## 2016-06-28 NOTE — Assessment & Plan Note (Signed)
Blood pressure initially above goal with systolic 144, but on recheck after sitting for 5 minutes her blood pressure systolic was 136. Asked her to have the nurse at her facility check her blood pressure couple times a week and call me if she sitting systolic numbers above 140. For now we will continue atenolol 50 mg once daily.

## 2016-06-28 NOTE — Assessment & Plan Note (Signed)
Patient with significant bilateral osteoarthritis of her knees, right is worse than left. She follows with [facility]. Currently she is getting steroid injections into her knees every 4 months. They prescribe her tramadol 50 mg daily as needed to improve her functional status. 30 tablets usually last her 1 month. They offered her knee replacement on the right side. She is considering this. I advised try think that this is her best chance of improving her long-term pain control. I did warn her that increasing pain medications would put her at increased fall risk.

## 2016-06-28 NOTE — Assessment & Plan Note (Signed)
Due for a mammogram by March 2018. She will be 76 next month and I advised that we stop breast-cancer screening after that. I also advised against further colon cancer screening given her advanced age and fragility.

## 2016-06-28 NOTE — Patient Instructions (Signed)
1. I gave you a prescription for compression stockings.   2. Continue all your medicines as you are doing.   3. Continue to see [facility] for your knee arthritis and pain medication

## 2016-08-20 ENCOUNTER — Other Ambulatory Visit: Payer: Self-pay | Admitting: Student in an Organized Health Care Education/Training Program

## 2016-09-09 DIAGNOSIS — M1711 Unilateral primary osteoarthritis, right knee: Secondary | ICD-10-CM | POA: Diagnosis not present

## 2016-09-12 IMAGING — CR DG ANKLE COMPLETE 3+V*R*
3 series · 3 of 3 positions shown · non-contrast
Comparison: None.

CLINICAL DATA: Status post fall, with right ankle pain. Initial
encounter.

EXAM:
RIGHT ANKLE - COMPLETE 3+ VIEW

[x ankle ap right]
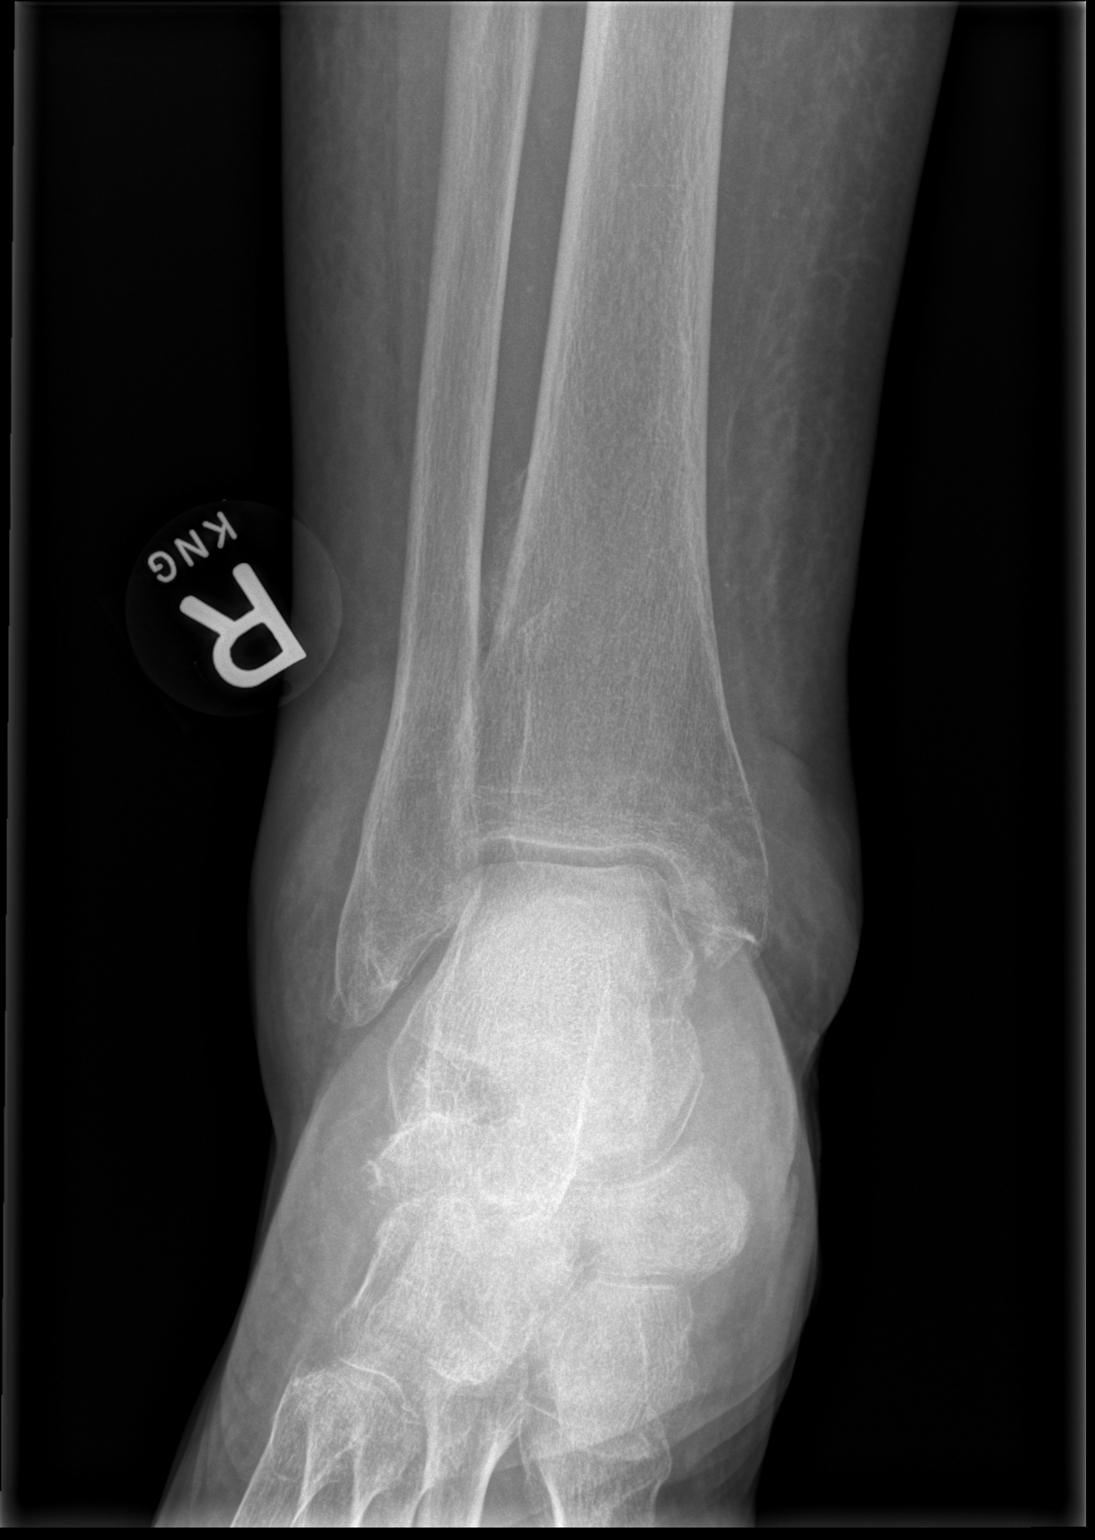

[x ankle obl right]
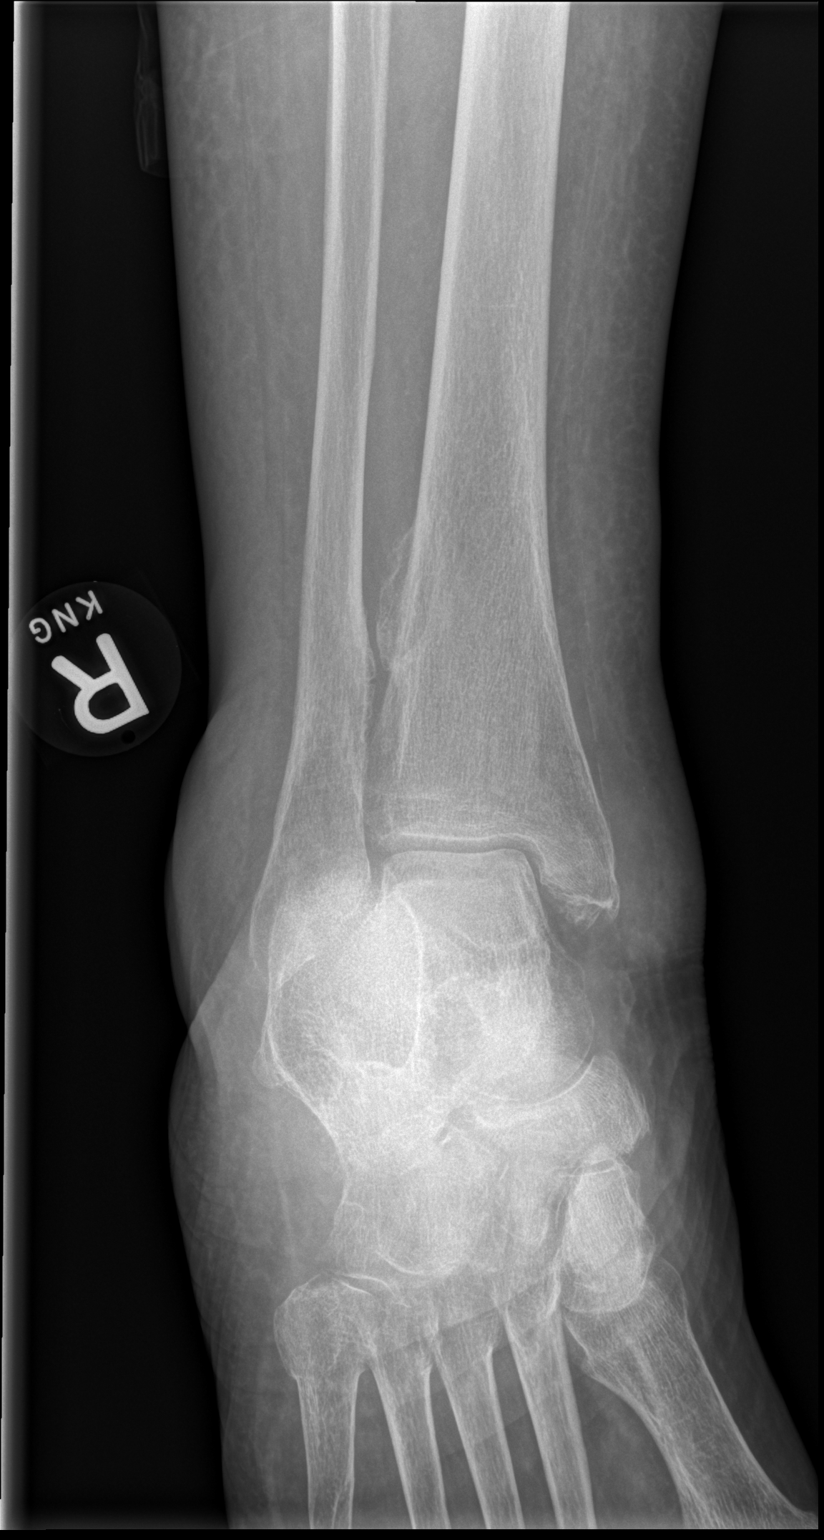

[x ankle lat right]
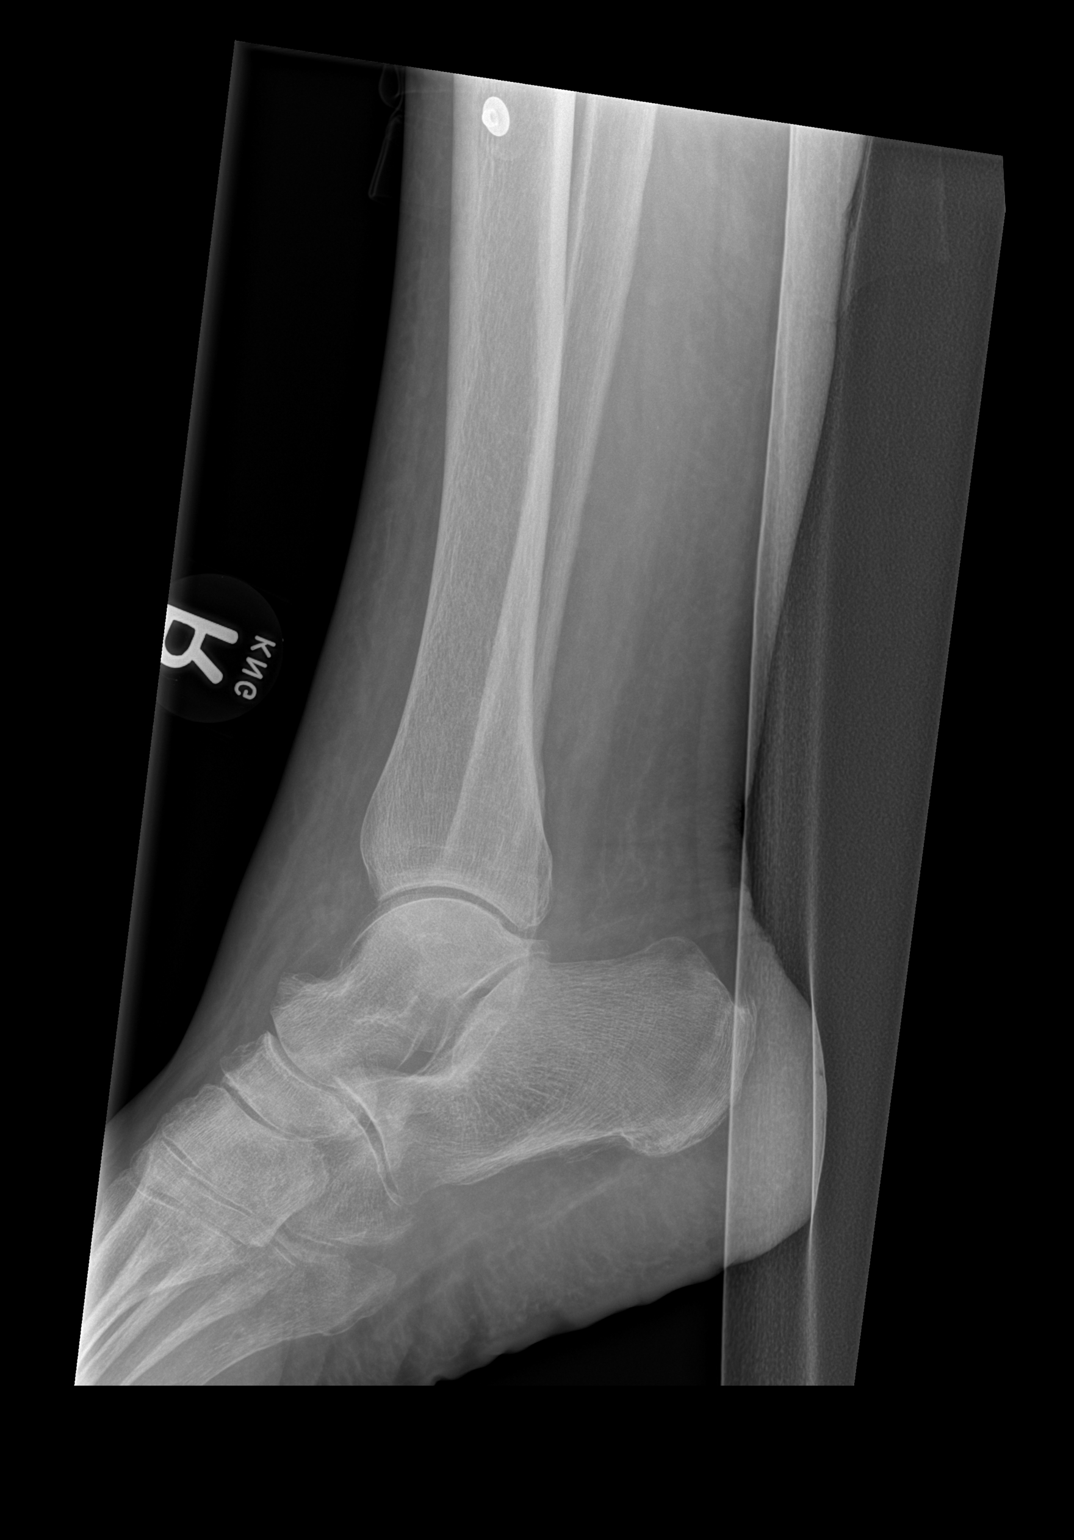

[3 of 3 positions shown; findings below may reference images not displayed]

FINDINGS: There is no evidence of fracture or dislocation. The ankle mortise
is intact; the interosseous space is within normal limits. No talar
tilt or subluxation is seen.

The joint spaces are preserved. Diffuse soft tissue swelling is
noted about the ankle.
IMPRESSION: No evidence of fracture or dislocation. Diffuse soft tissue swelling
noted about the ankle.

## 2016-09-27 ENCOUNTER — Other Ambulatory Visit: Payer: Self-pay | Admitting: Student in an Organized Health Care Education/Training Program

## 2016-09-30 ENCOUNTER — Other Ambulatory Visit: Payer: Self-pay | Admitting: Student in an Organized Health Care Education/Training Program

## 2016-10-11 ENCOUNTER — Ambulatory Visit (INDEPENDENT_AMBULATORY_CARE_PROVIDER_SITE_OTHER): Payer: Medicare Other | Admitting: Student in an Organized Health Care Education/Training Program

## 2016-10-11 ENCOUNTER — Encounter: Payer: Self-pay | Admitting: Student in an Organized Health Care Education/Training Program

## 2016-10-11 VITALS — BP 132/76 | HR 70 | Temp 97.8°F | Ht 67.0 in | Wt 206.9 lb

## 2016-10-11 DIAGNOSIS — I5032 Chronic diastolic (congestive) heart failure: Secondary | ICD-10-CM

## 2016-10-11 DIAGNOSIS — Z87310 Personal history of (healed) osteoporosis fracture: Secondary | ICD-10-CM | POA: Diagnosis not present

## 2016-10-11 DIAGNOSIS — D696 Thrombocytopenia, unspecified: Secondary | ICD-10-CM | POA: Diagnosis not present

## 2016-10-11 DIAGNOSIS — M170 Bilateral primary osteoarthritis of knee: Secondary | ICD-10-CM | POA: Diagnosis not present

## 2016-10-11 DIAGNOSIS — Z79899 Other long term (current) drug therapy: Secondary | ICD-10-CM | POA: Diagnosis not present

## 2016-10-11 DIAGNOSIS — D649 Anemia, unspecified: Secondary | ICD-10-CM

## 2016-10-11 DIAGNOSIS — M810 Age-related osteoporosis without current pathological fracture: Secondary | ICD-10-CM

## 2016-10-11 DIAGNOSIS — M1711 Unilateral primary osteoarthritis, right knee: Secondary | ICD-10-CM

## 2016-10-11 DIAGNOSIS — Z87891 Personal history of nicotine dependence: Secondary | ICD-10-CM

## 2016-10-11 DIAGNOSIS — I110 Hypertensive heart disease with heart failure: Secondary | ICD-10-CM | POA: Diagnosis not present

## 2016-10-11 DIAGNOSIS — I10 Essential (primary) hypertension: Secondary | ICD-10-CM | POA: Diagnosis not present

## 2016-10-11 DIAGNOSIS — M47816 Spondylosis without myelopathy or radiculopathy, lumbar region: Secondary | ICD-10-CM | POA: Diagnosis not present

## 2016-10-11 DIAGNOSIS — R739 Hyperglycemia, unspecified: Secondary | ICD-10-CM

## 2016-10-11 DIAGNOSIS — R600 Localized edema: Secondary | ICD-10-CM

## 2016-10-11 MED ORDER — TRAMADOL HCL 50 MG PO TABS: 50.0000 mg | ORAL_TABLET | Freq: Every day | ORAL | 2 refills | Status: DC | PRN

## 2016-10-11 NOTE — Progress Notes (Signed)
Assessment and Plan:  See Encounters tab for problem-based medical decision making.   __________________________________________________________  HPI:  76 year old woman here for follow-up of hypertension and lower extremity edema. Patient is doing very well at home. Last winter her daughter was severely ill in the intensive care unit and is recently returned home from a long stay at a skilled nursing facility for rehabilitation. This has been a great relief for the patient. She reports good compliance with her medications without adverse side effects. She completes all of her activities of daily living independently. She does not drive, usually family takes her around. She does exercise sparingly, is able to walk around the grocery store independently. Denies chest pain, dyspnea with exertion, orthopnea, or PND. No recent illnesses, no recent hospitalizations, ED visits, or urgent care visits. No bleeding events. She reports that her arthritis pain is stable in her knees and lower back. She's having some pain and stiffness in her left hand as well, reports very mild intermittent tingling sensation across all 5 digits of the left hand. Denies any weakness.  __________________________________________________________  Problem List: Patient Active Problem List   Diagnosis Date Noted  . Osteoporosis 04/03/2009    Priority: High  . Bilateral leg edema due to diastolic dysfunction 02/22/2007    Priority: High  . Chronic diastolic congestive heart failure (HCC) 09/28/2015    Priority: Medium  . Risk for falls 03/03/2015    Priority: Medium  . Anemia 05/01/2014    Priority: Medium  . Essential hypertension 09/14/2006    Priority: Medium  . Hyperlipidemia 04/27/2006    Priority: Medium  . Osteoarthritis 04/27/2006    Priority: Medium  . Lower back pain 04/25/2015    Priority: Low  . Healthcare maintenance 04/08/2014    Priority: Low  . Hyperglycemia 06/01/2012    Priority: Low  . Vitamin  D deficiency 05/08/2010    Priority: Low    Medications: Reconciled today in Epic __________________________________________________________  Physical Exam:  Vital Signs: Vitals:   10/11/16 1029  BP: 132/76  Pulse: 70  Temp: 97.8 F (36.6 C)  TempSrc: Oral  SpO2: 100%  Weight: 206 lb 14.4 oz (93.8 kg)  Height:  (1.702 m)    Gen: Well appearing, NAD ENT: OP clear without erythema or exudate.  Neck: No cervical LAD, No thyromegaly or nodules, No JVD. CV: RRR, no murmurs Pulm: Normal effort, CTA throughout, no wheezing Ext: Warm, 1+ bilateral pitting edema, compression stockings in place, knees with crepitus no effusion felt. Skin: No atypical appearing moles. No rashes

## 2016-10-11 NOTE — Assessment & Plan Note (Signed)
As usual, her blood pressure was elevated on first walking into the clinic, at recheck it was at goal with systolic 138. Plan to continue with atenolol and furosemide. Check BMP today.

## 2016-10-11 NOTE — Assessment & Plan Note (Signed)
Bilateral lower extremities edema is doing well today. This is chronic and stable. She has compression stockings on. We'll continue with furosemide 60 mg twice a day, potassium supplementation, and compression.

## 2016-10-11 NOTE — Patient Instructions (Signed)
1. We will check your blood work today. Continue your medicines as prescribed for now.

## 2016-10-11 NOTE — Assessment & Plan Note (Signed)
Her of the right radius in July 2017. She is doing well on alendronate once weekly without side effect. Plan is to continue this medication for 5 years.

## 2016-10-11 NOTE — Assessment & Plan Note (Signed)
Glucose on BMP in July 171. A1c has been consistently ok, but I will recheck today and at least annually to monitor for persistent hyperglycemia.

## 2016-10-11 NOTE — Assessment & Plan Note (Signed)
Symptomatically stable. Follows with [facility], receives injections to her knee osteoarthritis. Last one was 3 weeks ago and she says it is of some benefit. Uses tramadol as needed, 50 mg 3-4 times weekly. 30 tablets consistently lasts her 1 month. Reports that it helps with her activities. Denies any adverse side effects. I gave her a 3 month refill of tramadol today I think it is okay to continue this medication. I advised against increasing this in the future, which she agrees with. She wants to avoid knee replacement surgery for as long as possible. She reports being planned for injections to her lumbar spine and coming weeks. We requested records from the orthopedic clinic today to clarify.

## 2016-10-11 NOTE — Assessment & Plan Note (Addendum)
Chronic normocytic anemia associated with chronic mild thrombocytopenia. No associated symptoms, no bleeding. Will check CBC today, may add on ferritin, IFE, and light chains if anemia is worsening.

## 2016-10-12 ENCOUNTER — Encounter: Payer: Self-pay | Admitting: Student in an Organized Health Care Education/Training Program

## 2016-10-12 LAB — BMP8+ANION GAP
Anion Gap: 13 mmol/L (ref 10.0–18.0)
BUN / CREAT RATIO: 19 (ref 12–28)
BUN: 11 mg/dL (ref 8–27)
CO2: 26 mmol/L (ref 18–29)
CREATININE: 0.57 mg/dL (ref 0.57–1.00)
Calcium: 9.6 mg/dL (ref 8.7–10.3)
Chloride: 104 mmol/L (ref 96–106)
GFR calc Af Amer: 105 mL/min/{1.73_m2} (ref >59)
GFR, EST NON AFRICAN AMERICAN: 91 mL/min/{1.73_m2} (ref >59)
Glucose: 67 mg/dL (ref 65–99)
Potassium: 4.1 mmol/L (ref 3.5–5.2)
SODIUM: 143 mmol/L (ref 134–144)

## 2016-10-12 LAB — CBC
HEMATOCRIT: 35.4 % (ref 34.0–46.6)
Hemoglobin: 11.7 g/dL (ref 11.1–15.9)
MCH: 28.2 pg (ref 26.6–33.0)
MCHC: 33.1 g/dL (ref 31.5–35.7)
MCV: 85 fL (ref 79–97)
Platelets: 157 10*3/uL (ref 150–379)
RBC: 4.15 x10E6/uL (ref 3.77–5.28)
RDW: 13.6 % (ref 12.3–15.4)
WBC: 4.6 10*3/uL (ref 3.4–10.8)

## 2016-10-12 LAB — HEMOGLOBIN A1C
Est. average glucose Bld gHb Est-mCnc: 88 mg/dL
Hgb A1c MFr Bld: 4.7 % — ABNORMAL LOW (ref 4.8–5.6)

## 2016-11-05 ENCOUNTER — Other Ambulatory Visit: Payer: Self-pay | Admitting: Internal Medicine

## 2016-11-18 ENCOUNTER — Other Ambulatory Visit: Payer: Self-pay | Admitting: Student in an Organized Health Care Education/Training Program

## 2016-12-20 ENCOUNTER — Ambulatory Visit (INDEPENDENT_AMBULATORY_CARE_PROVIDER_SITE_OTHER): Payer: Medicare Other | Admitting: Neurology

## 2016-12-20 ENCOUNTER — Encounter: Payer: Self-pay | Admitting: Neurology

## 2016-12-20 VITALS — BP 136/81 | HR 73 | Ht 67.0 in | Wt 204.0 lb

## 2016-12-20 DIAGNOSIS — I6990 Unspecified sequelae of unspecified cerebrovascular disease: Secondary | ICD-10-CM | POA: Diagnosis not present

## 2016-12-20 NOTE — Progress Notes (Signed)
[name]: Carla [name] DOB: March 07, 1941  REASON FOR VISIT: follow up- history of stroke HISTORY FROM: [name]  HISTORY OF PRESENT ILLNESS: Carla [name] is a 76 year old female with a history of cerebrovascular disease and stroke. She returns today for an evaluation. Carla [name]'s primary care provider manages her cholesterol and hypertension. Today her blood pressure is good-it is 120/71. Carla [name] is on atorvastatin for her cholesterol. Her recent lipid panel in March showed that her LDL was 80. Carla [name] denies any new neurological symptoms. Denies any strokelike symptoms. She continues on Plavix for stroke prevention. She will occasionally have numbness in Carla left hand- this has been ongoing. Carla [name] had carotid Doppler studies in February 2016 that were unremarkable. Carla [name] reports that she does have some trouble with Carla right knee she has been seen orthopedic who has recommended knee replacement. She returns today for an evaluation.  HISTORY 07/23/14 (Carla [name]):73 year Carla [name] lady seen for first office followup visit following hospital admission on 05/29/13 for stroke. She presented with sudden onset of left-sided numbness. She actually had a similar presentation 3 weeks ago in November and that time she is admitted fibrillation for stroke but no acute infarct and bilateral old basal ganglia and thalamic lacunar infarcts are noted. During Carla current admission she was found to have an acute left thalamic and right pontine infarct. Etiology was found to be small vessel disease. MRI of Carla brain showed no large vessel occlusion. Transthoracic echo showed normal ejection fraction without context of embolism. Lipid profile showed total cholesterol 205, triglycerides 68, HDL 59 and LDL 132 mg percent. She was started on atorvastatin and Plavix. She still should done well since discharge he still had some intermittent left face and hand tingling and numbness from time to time which is  getting better but does not completely improve. She states her blood pressure is well controlled with a slightly elevated in office today. She is tolerating Plavix without bleeding or bruising as well as Lipitor without any side effects. She plans to go on diet, lose weight and exercise regularly. She has no new complaints today.   UPDATE 11/16/13 ([name]): Carla [name] returns to Carla office for stroke followup. She has been doing well, no new neurovascular symptoms. Her blood pressure is well controlled, it is 120/73 in Carla office today. She has an appt at her PCP in June to have blood work checked, will have lipids checked then. She is tolerating statin and Plavix well without any known side effects. Still having some intermittent hand tingling and numbness occasionally. No new complaints. Update 07/23/2014 : She returns for follow-up after last visit 7 months ago. She continues to do well without recurrent stroke or TIA symptoms. She at has some intermittent numbness in Carla left hand particularly when she is tired but it is not bothersome. She is tolerating Plavix well without significant bleeding, bruising or other side effects. She states she had lab work done in Carla primary care physician's office but I do not have Carla results for my review today. Her blood pressure is quite good and today it is 122/73 in office. She has some walking difficulties mainly related to her right knee pain. No other new neurological issues.  Update 12/22/2015 : She returns follow-up today after last visit on 01/22/15. She is admitted in April 2017 with episode of headache as well as left face lip and hand numbness and tingling. MRI scan of Carla brain showed tiny right parietal white  matter lacunar infarct. I have personally reviewed Carla images and MRI scan. MRA of Carla brain showed no large vessel stenosis and MRA of Carla neck show only minor atheromatous changes at left carotid bifurcation. Transthoracic echo showed normal ejection  fraction. She had subsequent outpatient 48-hour Holter monitor which showed no significant arrhythmias. LDL cholesterol is mildly elevated at 104. [name] was on Plavix and baby aspirin was added for 3 months and she is tolerating it well with minor bruising but no bleeding episodes. She remains on Lipitor 80 mg and is tolerating well with Carla muscle aches or pains. Blood pressure is well controlled and today it is 1-0/72. She still has some intermittent numbness on Carla left face, cheek and fingertips but this is not bothersome and she notices only when she is tired. She is more bothered by Carla right knee pain from arthritis she walks slowly with a cane. She is thinking about having knee replacement done but has not yet made up her mind. Update 12/20/2016 ; she returns for follow-up after last visit a year ago. She states she has had no recurrent stroke or TIA symptoms. She had a fall and fractured her right wrist requiring a cost but no surgery. She is doing better now. She remains on Plavix which is tolerating well without bruising or bleeding. She states her blood pressure is well controlled and today it is 136/81. She remains on Lipitor which she is tolerating well without side effects and states her last lipid profile checked by primary physician in May this year was satisfactory. She is bothered by right knee pain since thinking about surgery. She has not had follow-up carotid ultrasound done this year. She has no new neurological complaints. REVIEW OF SYSTEMS: Out of a complete 14 system review of symptoms, Carla [name] complains only of Carla following symptoms, and all other reviewed systems are negative.: Ringing in Carla ears, leg swelling, joint swelling, numbness  ALLERGIES: Allergies  Allergen Reactions  . Sulfonamide Derivatives Shortness Of Breath  . Penicillins Swelling and Other (See Comments)    REACTION: Local swelling in right arm after injection. Has [name] had a PCN reaction causing  immediate rash, facial/tongue/throat swelling, SOB or lightheadedness with hypotension:No Has [name] had a PCN reaction causing severe rash involving mucus membranes or skin necrosis:No Has [name] had a PCN reaction that required hospitalization:No Has [name] had a PCN reaction occurring within Carla last 10 years:no If all of Carla above answers are "NO", then may proceed with Cephalosporin use.     HOME MEDICATIONS: Outpatient Medications Prior to Visit  Medication Sig Dispense Refill  . alendronate (FOSAMAX) 70 MG tablet TAKE 1 TABLET EVERY 7 DAYS WITH A FULL GLASS OF WATER ON AN EMPTY STOMACH 12 tablet 3  . atenolol (TENORMIN) 50 MG tablet TAKE 1 TABLET (50 MG TOTAL) BY MOUTH DAILY. 31 tablet 2  . atorvastatin (LIPITOR) 80 MG tablet TAKE 1 TABLET BY MOUTH EVERY DAY AT 6PM 90 tablet 2  . Calcium Carbonate-Vitamin D 600-400 MG-UNIT tablet TAKE 1 TABLET BY MOUTH 2 (TWO) TIMES DAILY. 60 tablet 5  . clopidogrel (PLAVIX) 75 MG tablet Take 1 tablet (75 mg total) by mouth daily. 90 tablet 3  . furosemide (LASIX) 20 MG tablet Take 1 tablet (20 mg total) by mouth 2 (two) times daily. 180 tablet 3  . KLOR-CON M20 20 MEQ tablet TAKE 1 TABLET (20 MEQ TOTAL) BY MOUTH DAILY. 90 tablet 2  . traMADol (ULTRAM) 50 MG tablet Take 1 tablet (  50 mg total) by mouth daily as needed. 30 tablet 2   No facility-administered medications prior to visit.     PAST MEDICAL HISTORY: Past Medical History:  Diagnosis Date  . Abnormal electromyogram (EMG)    Low grade right S1 radiculopathy 7/93.  . Diastolic dysfunction    a. 08/2012 Echo: EF 60-65%, Gr 2 DD, triv MR, PASP .  [name] Distal radius fracture, right 12/22/2015  . DJD (degenerative joint disease)   . Elevated alkaline phosphatase level    Bone scan 08/2004 showed no evidence of abnormal bony activity..  . Herniated lumbar intervertebral disc    S/P L4-5 & L5-S1 laminotomy, foraminotomy, and L5-S1 diskectomy by Dr. Trey Sailors 11/01/2000.  [name] Hyperlipemia   .  Hypertension   . Iron deficiency anemia   . Osteopenia    A DEXA bone density scan done 02/13/2009 showed a left femur neck young adult T-score of -1.6 (osteopenia), a right femur neck young adult T-score of -1.6 (osteopenia), and an AP spine young adult T-score of 1.3 (normal).  Her FRAX score gave an estimated 10 year probability of 6.6% for major osteoporotic fracture and 0.8% for hip fracture.  . Peripheral neuropathy   . Rotator cuff syndrome of right shoulder    S/P right shoulder arthroscopic debridement of a massive rotator cuff tear, greater tuberosity and resection of subacromial spur by Dr. Gean Birchwood 07/07/2005.  . Stroke (HCC) 04/2013  . SVT (supraventricular tachycardia) (HCC)    PSVT  . Vitamin D deficiency     PAST SURGICAL HISTORY: Past Surgical History:  Procedure Laterality Date  . BACK SURGERY    . LAPAROSCOPIC CHOLECYSTECTOMY  1998  . LUMBAR DISC SURGERY  11/01/2000    L4-5, L5-S1 laminotomy, foraminotomy and L5-S1 diskectomy  . SHOULDER ARTHROSCOPY  1/172007   S/P right shoulder arthroscopic debridement of a massive rotator cuff tear, greater tuberoplasty and resection of subacromial spur by Dr. Gean Birchwood on 07/07/2005.  [name] TOTAL ABDOMINAL HYSTERECTOMY  1992    FAMILY HISTORY: Family History  Problem Relation Age of Onset  . Ovarian cancer Mother 64  . Hypertension Mother   . Heart attack Mother 6  . Cancer Brother   . Hypertension Sister   . Hypertension Sister   . Neuropathy Sister   . Breast cancer Neg Hx   . Colon cancer Neg Hx   . Lung cancer Neg Hx     SOCIAL HISTORY: Social History   Social History  . Marital status: Single    Spouse name: N/A  . Number of children: 6  . Years of education: 121th   Occupational History  . retired    Social History Main Topics  . Smoking status: Former Smoker    Packs/day: 0.15    Years: 2.00    Types: Cigarettes    Quit date: 10/07/1999  . Smokeless tobacco: Never Used  . Alcohol use No  . Drug  use: No  . Sexual activity: Not Currently   Other Topics Concern  . Not on file   Social History Narrative   [name] is single and lives at home alone.    Disabled.   Education high school.   Right handed.   Caffeine sometimes one cup of coffee.      PHYSICAL EXAM  Vitals:   12/20/16 1150  BP: 136/81  Pulse: 73  Weight: 204 lb (92.5 kg)  Height: 5\' 7"  (1.702 m)   Body mass index is 31.95 kg/m.  Generalized: Obese elderly  African-[name] lady in no acute distress . Bilateral lower extremity edema  Neurological examination  Mentation: Alert oriented to time, place, history taking. Follows all commands speech and language fluent Cranial nerve II-XII: Pupils were equal round reactive to light. Extraocular movements were full, visual field were full on confrontational test. Facial sensation and strength were normal. Uvula tongue midline. Head turning and shoulder shrug  were normal and symmetric. Motor: Carla motor testing reveals 5 over 5 strength of all 4 extremities. Good symmetric motor tone is noted throughout.  Sensory: Sensory testing is intact to soft touch on all 4 extremities. No evidence of extinction is noted.  Coordination: Cerebellar testing reveals good finger-nose-finger and heel-to-shin bilaterally.  Gait and station: Gait is antalgic and favors Carla right knee due to pain and uses a cane. Tandem gait is not possible Romberg is negative. No drift is seen.  Reflexes: Deep tendon reflexes are symmetric and normal bilaterally.   DIAGNOSTIC DATA (LABS, IMAGING, TESTING) - I reviewed [name] records, labs, notes, testing and imaging myself where available.  Lab Results  Component Value Date   WBC 4.6 10/11/2016   HGB 11.7 10/11/2016   HCT 35.4 10/11/2016   MCV 85 10/11/2016   PLT 157 10/11/2016      Component Value Date/Time   NA 143 10/11/2016 1110   K 4.1 10/11/2016 1110   CL 104 10/11/2016 1110   CO2 26 10/11/2016 1110   GLUCOSE 67 10/11/2016 1110    GLUCOSE 106 (H) 12/25/2015 0350   BUN 11 10/11/2016 1110   CREATININE 0.57 10/11/2016 1110   CREATININE 0.66 08/22/2014 0925   CALCIUM 9.6 10/11/2016 1110   PROT 6.2 (L) 09/27/2015 1746   ALBUMIN 3.6 09/27/2015 1746   AST 16 09/27/2015 1746   ALT 13 (L) 09/27/2015 1746   ALKPHOS 101 09/27/2015 1746   BILITOT 1.1 09/27/2015 1746   GFRNONAA 91 10/11/2016 1110   GFRNONAA 88 08/22/2014 0925   GFRAA 105 10/11/2016 1110   GFRAA >89 08/22/2014 0925   Lab Results  Component Value Date   CHOL 166 09/28/2015   HDL 52 09/28/2015   LDLCALC 104 (H) 09/28/2015   TRIG 50 09/28/2015   CHOLHDL 3.2 09/28/2015   Lab Results  Component Value Date   HGBA1C 4.7 (L) 10/11/2016   Lab Results  Component Value Date   VITAMINB12 1,149 (H) 09/28/2015   Lab Results  Component Value Date   TSH 2.076 03/31/2010      ASSESSMENT AND PLAN 76 y.o. year old female  has a past medical history of Abnormal electromyogram (EMG); Diastolic dysfunction; Distal radius fracture, right (12/22/2015); DJD (degenerative joint disease); Elevated alkaline phosphatase level; Herniated lumbar intervertebral disc; Hyperlipemia; Hypertension; Iron deficiency anemia; Osteopenia; Peripheral neuropathy; Rotator cuff syndrome of right shoulder; Stroke (HCC) (04/2013); SVT (supraventricular tachycardia) (HCC); and Vitamin D deficiency. here with:  follow-up visit following recent admission for right pareital lacunar stroke in April 2017 from small vessel disease     I had a long d/w [name] about his recent stroke, risk for recurrent stroke/TIAs, personally independently reviewed imaging studies and stroke evaluation results and answered questions.Continue Plavix  for secondary stroke prevention and maintain strict control of hypertension with blood pressure goal below 130/90, diabetes with hemoglobin A1c goal below 6.5% and lipids with LDL cholesterol goal below 70 mg/dL. I also advised Carla [name] to eat a healthy diet with plenty  of whole grains, cereals, fruits and vegetables, exercise regularly and maintain ideal body weight. Check follow-up carotid  ultrasound study. Greater than 50% time during this 25 minute visit was spent on counseling and coordination of care stroke and TIA, prevention and treatment option discussion answering questions Followup in Carla future with me in  1 year or call earlier if necessary  Carla [name]  12/20/2016, 3:01 PM [facility] [address], [address] [place], [place] [address] [phone]  Note: This document was prepared with digital dictation and possible smart phrase technology. Any transcriptional errors that result from this process are unintentional.

## 2016-12-20 NOTE — Patient Instructions (Signed)
I had a long d/w patient about his recent stroke, risk for recurrent stroke/TIAs, personally independently reviewed imaging studies and stroke evaluation results and answered questions.Continue Plavix  for secondary stroke prevention and maintain strict control of hypertension with blood pressure goal below 130/90, diabetes with hemoglobin A1c goal below 6.5% and lipids with LDL cholesterol goal below 70 mg/dL. I also advised the patient to eat a healthy diet with plenty of whole grains, cereals, fruits and vegetables, exercise regularly and maintain ideal body weight. Check follow-up carotid ultrasound study Followup in the future with me in  1 year or call earlier if necessary

## 2017-01-06 ENCOUNTER — Ambulatory Visit (HOSPITAL_COMMUNITY): Admission: RE | Admit: 2017-01-06 | Payer: Medicare Other | Source: Ambulatory Visit

## 2017-01-13 ENCOUNTER — Ambulatory Visit (HOSPITAL_COMMUNITY): Payer: Medicare Other

## 2017-01-18 ENCOUNTER — Ambulatory Visit (HOSPITAL_COMMUNITY)
Admission: RE | Admit: 2017-01-18 | Discharge: 2017-01-18 | Disposition: A | Discharge status: Home / Self Care | Payer: Medicare Other | Source: Ambulatory Visit | Attending: Neurology | Admitting: Neurology

## 2017-01-18 DIAGNOSIS — I6990 Unspecified sequelae of unspecified cerebrovascular disease: Secondary | ICD-10-CM | POA: Diagnosis not present

## 2017-01-18 NOTE — Progress Notes (Signed)
VASCULAR LAB PRELIMINARY  PRELIMINARY  PRELIMINARY  PRELIMINARY  Carotid duplex completed.    Preliminary report:  Bilateral:  1-39% ICA stenosis.  Vertebral artery flow is antegrade.     [name], RVS 01/18/2017, 2:52 PM

## 2017-01-19 ENCOUNTER — Telehealth: Payer: Self-pay

## 2017-01-19 LAB — VAS US CAROTID
LCCADSYS: -72 cm/s
LCCAPDIAS: -23 cm/s
LEFT ECA DIAS: 0 cm/s
LEFT VERTEBRAL DIAS: 8 cm/s
LICADDIAS: -21 cm/s
LICAPDIAS: -15 cm/s
LICAPSYS: -50 cm/s
Left CCA dist dias: -20 cm/s
Left CCA prox sys: -113 cm/s
Left ICA dist sys: -62 cm/s
RCCAPSYS: 129 cm/s
RIGHT ECA DIAS: -1 cm/s
RIGHT VERTEBRAL DIAS: -13 cm/s
Right CCA prox dias: 21 cm/s
Right cca dist sys: -85 cm/s

## 2017-01-19 NOTE — Telephone Encounter (Signed)
-----   Message from Micki RileyPramod S [name], MD sent at 01/19/2017  8:45 AM EDT ----- Joneen RoachKindly inform the patient that carotid ultrasound study done on 01/18/17 showed no significant extracranial stenosis. No worrisome findings

## 2017-01-19 NOTE — Telephone Encounter (Signed)
Rn call patient that her carotid ultrasound showed no significant extracranial stenosis. No worrisome findings. Pt verbalized understanding.

## 2017-02-19 ENCOUNTER — Other Ambulatory Visit: Payer: Self-pay | Admitting: Student in an Organized Health Care Education/Training Program

## 2017-02-28 ENCOUNTER — Ambulatory Visit (INDEPENDENT_AMBULATORY_CARE_PROVIDER_SITE_OTHER): Payer: Medicare Other | Admitting: Student in an Organized Health Care Education/Training Program

## 2017-02-28 ENCOUNTER — Encounter: Payer: Self-pay | Admitting: Student in an Organized Health Care Education/Training Program

## 2017-02-28 VITALS — BP 137/65 | HR 82 | Temp 98.4°F | Ht 67.0 in | Wt 207.4 lb

## 2017-02-28 DIAGNOSIS — I10 Essential (primary) hypertension: Secondary | ICD-10-CM | POA: Diagnosis not present

## 2017-02-28 DIAGNOSIS — I5032 Chronic diastolic (congestive) heart failure: Secondary | ICD-10-CM

## 2017-02-28 DIAGNOSIS — Z23 Encounter for immunization: Secondary | ICD-10-CM | POA: Diagnosis not present

## 2017-02-28 DIAGNOSIS — M1711 Unilateral primary osteoarthritis, right knee: Secondary | ICD-10-CM

## 2017-02-28 DIAGNOSIS — M810 Age-related osteoporosis without current pathological fracture: Secondary | ICD-10-CM

## 2017-02-28 MED ORDER — TRAMADOL HCL 50 MG PO TABS: 50.0000 mg | ORAL_TABLET | Freq: Every day | ORAL | 2 refills | Status: DC | PRN

## 2017-02-28 NOTE — Progress Notes (Signed)
   Assessment and Plan:  See Encounters tab for problem-based medical decision making.   __________________________________________________________  HPI:   76 year old woman here for follow-up of diastolic heart failure. She is chronic lower extremity edema, right leg worse than the left. She reports that it is been well-controlled over the last few months with combination of Lasix and compression stockings. She reports good exertional capacity, denies shortness of breath or chest pain. She also has a history of lower back pain due to osteoarthritis. She's been intermittently using a back brace which she thinks helps with her posture. She has significant right knee osteoarthritis for which her orthopedic surgeon has offered knee replacement surgery. However the patient wants to delay surgery as long as possible. Currently we are medically managing her with as needed tramadol. She takes 1 tab about twice weekly on the bad days. She says that this is effective in helping her functional status. Denies any falls at home. She walks with a cane which also helps. No fevers or chills. Eating and drinking fine. Reports good compliance with her medications without side effects.  __________________________________________________________  Problem List: Patient Active Problem List   Diagnosis Date Noted  . Osteoporosis 04/03/2009    Priority: High  . Bilateral leg edema due to diastolic dysfunction 02/22/2007    Priority: High  . Chronic diastolic congestive heart failure (HCC) 09/28/2015    Priority: Medium  . Risk for falls 03/03/2015    Priority: Medium  . Anemia 05/01/2014    Priority: Medium  . Essential hypertension 09/14/2006    Priority: Medium  . Hyperlipidemia 04/27/2006    Priority: Medium  . Osteoarthritis 04/27/2006    Priority: Medium  . Lower back pain 04/25/2015    Priority: Low  . Healthcare maintenance 04/08/2014    Priority: Low  . Hyperglycemia 06/01/2012    Priority:  Low  . Vitamin D deficiency 05/08/2010    Priority: Low    Medications: Reconciled today in Epic __________________________________________________________  Physical Exam:  Vital Signs: Vitals:   02/28/17 1030  BP: 137/65  Pulse: 82  Temp: 98.4 F (36.9 C)  TempSrc: Oral  SpO2: 100%  Weight: 207 lb 6.4 oz (94.1 kg)  Height: 5\' 7"  (1.702 m)    Gen: Well appearing, NAD CV: RRR, no murmurs Pulm: Normal effort, CTA throughout, no wheezing Abd: Soft, NT, ND, normal BS.  Ext: Warm, 2+ pitting edema on the right, 1+ pitting edema on the left Skin: No atypical appearing moles. No rashes

## 2017-02-28 NOTE — Assessment & Plan Note (Signed)
Body status looks stable. Good exertional capacity. She has chronic lower extremity edema for which she uses Lasix. She also finds good benefit from compression stockings. The edema actually looks better than it has in the past.

## 2017-02-28 NOTE — Assessment & Plan Note (Signed)
Blood pressure well controlled today. We'll continue with atenolol.

## 2017-02-28 NOTE — Assessment & Plan Note (Signed)
Diagnosed by a fragility fracture in July 2017. She is tolerating weekly alendronate well, which we will continue.

## 2017-02-28 NOTE — Assessment & Plan Note (Signed)
Right knee osteoarthritis continues to be a chronic issue. She follows with [facility] and is trying to delay the need for right knee replacement as long as possible. She is doing very well with medical treatment, she uses tramadol 50 mg about twice weekly with good effect. She reports  having a good functional capacity, the tramadol helps with the bad days. She also received a prescription of meloxicam which she is interested in trying. She has a four point cane which is also been helping. I think is fine to continue with this conservative approach. I provided her with another refill of tramadol which should last her about six months.

## 2017-03-13 ENCOUNTER — Other Ambulatory Visit: Payer: Self-pay | Admitting: Student in an Organized Health Care Education/Training Program

## 2017-04-19 ENCOUNTER — Other Ambulatory Visit: Payer: Self-pay | Admitting: Student in an Organized Health Care Education/Training Program

## 2017-05-07 ENCOUNTER — Other Ambulatory Visit: Payer: Self-pay | Admitting: Student in an Organized Health Care Education/Training Program

## 2017-06-06 ENCOUNTER — Other Ambulatory Visit: Payer: Self-pay | Admitting: Student in an Organized Health Care Education/Training Program

## 2017-06-27 ENCOUNTER — Other Ambulatory Visit: Payer: Self-pay | Admitting: Student in an Organized Health Care Education/Training Program

## 2017-08-29 ENCOUNTER — Ambulatory Visit: Payer: Self-pay | Admitting: Student in an Organized Health Care Education/Training Program

## 2017-09-05 ENCOUNTER — Encounter (INDEPENDENT_AMBULATORY_CARE_PROVIDER_SITE_OTHER): Payer: Self-pay

## 2017-09-05 ENCOUNTER — Encounter: Payer: Self-pay | Admitting: Student in an Organized Health Care Education/Training Program

## 2017-09-05 ENCOUNTER — Ambulatory Visit (INDEPENDENT_AMBULATORY_CARE_PROVIDER_SITE_OTHER): Payer: Medicare Other | Admitting: Student in an Organized Health Care Education/Training Program

## 2017-09-05 VITALS — BP 155/84 | HR 78 | Temp 98.8°F | Wt 223.0 lb

## 2017-09-05 DIAGNOSIS — R5383 Other fatigue: Secondary | ICD-10-CM | POA: Diagnosis not present

## 2017-09-05 DIAGNOSIS — Z9114 Patient's other noncompliance with medication regimen: Secondary | ICD-10-CM | POA: Diagnosis not present

## 2017-09-05 DIAGNOSIS — I10 Essential (primary) hypertension: Secondary | ICD-10-CM

## 2017-09-05 DIAGNOSIS — M549 Dorsalgia, unspecified: Secondary | ICD-10-CM | POA: Diagnosis not present

## 2017-09-05 DIAGNOSIS — I110 Hypertensive heart disease with heart failure: Secondary | ICD-10-CM

## 2017-09-05 DIAGNOSIS — I5032 Chronic diastolic (congestive) heart failure: Secondary | ICD-10-CM | POA: Diagnosis not present

## 2017-09-05 DIAGNOSIS — G8929 Other chronic pain: Secondary | ICD-10-CM

## 2017-09-05 DIAGNOSIS — M170 Bilateral primary osteoarthritis of knee: Secondary | ICD-10-CM | POA: Diagnosis not present

## 2017-09-05 DIAGNOSIS — Z79899 Other long term (current) drug therapy: Secondary | ICD-10-CM

## 2017-09-05 MED ORDER — FUROSEMIDE 40 MG PO TABS: 80.0000 mg | ORAL_TABLET | Freq: Every day | ORAL | 1 refills | Status: DC

## 2017-09-05 NOTE — Assessment & Plan Note (Signed)
New complaint of mild fatigue.  Patient has a history of elevated TSH in 2011.  Will repeat TSH today to rule out hypothyroidism.

## 2017-09-05 NOTE — Progress Notes (Signed)
   Assessment and Plan:  See Encounters tab for problem-based medical decision making.   __________________________________________________________  HPI:   77 year old woman here for follow-up of hypertension.  She reports doing well at home, only acute complaint today is increasing swelling in her lower extremities.  She tells me that there are many days that she skips a dose of her Lasix.  She does not like to take this medication if she has errands to run outside of the house because of urgency with urination.  She denies any dyspnea on exertion, no orthopnea or PND.  She is reporting increasing fatigue throughout the day, though she feels like her sleep is stable.  No fevers or chills, no chest pain or pressure.  She reports good compliance with her other medications without adverse side effects.  She walks with the assistance of a cane because of advanced osteoarthritis in both of her knees and chronic lower back pain.  She lives by herself and is independent in all her activities of daily living.  __________________________________________________________  Problem List: Patient Active Problem List   Diagnosis Date Noted  . Osteoporosis 04/03/2009    Priority: High  . Bilateral leg edema due to diastolic dysfunction 02/22/2007    Priority: High  . Chronic diastolic congestive heart failure (HCC) 09/28/2015    Priority: Medium  . Risk for falls 03/03/2015    Priority: Medium  . Anemia 05/01/2014    Priority: Medium  . Essential hypertension 09/14/2006    Priority: Medium  . Hyperlipidemia 04/27/2006    Priority: Medium  . Osteoarthritis 04/27/2006    Priority: Medium  . Lower back pain 04/25/2015    Priority: Low  . Healthcare maintenance 04/08/2014    Priority: Low  . Hyperglycemia 06/01/2012    Priority: Low  . Vitamin D deficiency 05/08/2010    Priority: Low  . Fatigue 09/05/2017    Medications: Reconciled today in  Epic __________________________________________________________  Physical Exam:  Vital Signs: Vitals:   09/05/17 1058  BP: (!) 155/84  Pulse: 78  Temp: 98.8 F (37.1 C)  TempSrc: Oral  SpO2: 100%  Weight: 223 lb (101.2 kg)    Gen: Well appearing, NAD CV: RRR, no murmurs Pulm: Normal effort, CTA throughout, no wheezing Ext: Warm, 2+ pitting edema bilaterally, right leg worse than left.

## 2017-09-05 NOTE — Patient Instructions (Signed)
1. You may take the Lasix two tablets once a day if that is better for your schedule. Please try to take this medicine regularly, because you are building up fluid on your legs.

## 2017-09-05 NOTE — Assessment & Plan Note (Signed)
Blood pressure is above goal today.  Her blood pressure is usually well controlled and this is a little bit of an anomaly.  I think it is related to her overall volume overload and under diuresis.  Plan is to increase Lasix to 80 mg daily, continue with atenolol 50 mg once daily.  I anticipate if we get her euvolemic again her blood pressure will return back to near goal.  I think if we need more blood pressure control and ACE inhibitor can be added in the future.

## 2017-09-05 NOTE — Assessment & Plan Note (Signed)
Patient appears volume overloaded today, likely due to underutilization of oral diuresis and dietary indiscretion.  Plan is to increase Lasix to 80 mg once daily, and we talked for a while about the importance of good compliance with this.  Continue with potassium supplementation.  Check a BMP today.  Follow-up with me in 6 weeks to check in on volume status and adjust diuretics further.

## 2017-09-06 ENCOUNTER — Encounter: Payer: Self-pay | Admitting: Student in an Organized Health Care Education/Training Program

## 2017-09-06 LAB — BMP8+ANION GAP
Anion Gap: 15 mmol/L (ref 10.0–18.0)
BUN / CREAT RATIO: 21 (ref 12–28)
BUN: 13 mg/dL (ref 8–27)
CO2: 24 mmol/L (ref 20–29)
CREATININE: 0.62 mg/dL (ref 0.57–1.00)
Calcium: 9.8 mg/dL (ref 8.7–10.3)
Chloride: 105 mmol/L (ref 96–106)
GFR calc Af Amer: 101 mL/min/{1.73_m2} (ref >59)
GFR calc non Af Amer: 88 mL/min/{1.73_m2} (ref >59)
Glucose: 74 mg/dL (ref 65–99)
Potassium: 4.3 mmol/L (ref 3.5–5.2)
SODIUM: 144 mmol/L (ref 134–144)

## 2017-09-06 LAB — TSH: TSH: 2.26 u[IU]/mL (ref 0.450–4.500)

## 2017-09-21 ENCOUNTER — Other Ambulatory Visit: Payer: Self-pay | Admitting: Student in an Organized Health Care Education/Training Program

## 2017-10-13 ENCOUNTER — Encounter: Payer: Self-pay | Admitting: Internal Medicine

## 2017-10-13 ENCOUNTER — Ambulatory Visit (INDEPENDENT_AMBULATORY_CARE_PROVIDER_SITE_OTHER): Payer: Medicare Other | Admitting: Internal Medicine

## 2017-10-13 VITALS — BP 122/73 | HR 75 | Temp 98.2°F | Ht 67.0 in | Wt 218.1 lb

## 2017-10-13 DIAGNOSIS — I5032 Chronic diastolic (congestive) heart failure: Secondary | ICD-10-CM

## 2017-10-13 DIAGNOSIS — I10 Essential (primary) hypertension: Secondary | ICD-10-CM

## 2017-10-13 DIAGNOSIS — I110 Hypertensive heart disease with heart failure: Secondary | ICD-10-CM

## 2017-10-13 DIAGNOSIS — Z79899 Other long term (current) drug therapy: Secondary | ICD-10-CM

## 2017-10-13 NOTE — Patient Instructions (Addendum)
It was great meeting you today, I'm glad you are feeling well and that your lower extremities are improved. I think you still have some additional fluid to remove to get you back to your baseline, as your weight seems up about 10 lbs. Please continue taking Lasix 80mg  daily and your potassium supplement for now.   Please stop by the lab on your way out so I can make sure your kidneys and potassium are OK. I will call you with any medication changes. Otherwise, please follow-up with Dr. Oswaldo DoneVincent in 6-weeks to see how your swelling and BP are doing.   FOLLOW-UP INSTRUCTIONS - Preferably with PCP. When: 6 weeks For: Edema, HTN What to bring: Medications

## 2017-10-14 LAB — BMP8+ANION GAP
Anion Gap: 14 mmol/L (ref 10.0–18.0)
BUN/Creatinine Ratio: 25 (ref 12–28)
BUN: 15 mg/dL (ref 8–27)
CO2: 26 mmol/L (ref 20–29)
Calcium: 9.7 mg/dL (ref 8.7–10.3)
Chloride: 105 mmol/L (ref 96–106)
Creatinine, Ser: 0.59 mg/dL (ref 0.57–1.00)
GFR, EST AFRICAN AMERICAN: 103 mL/min/{1.73_m2} (ref >59)
GFR, EST NON AFRICAN AMERICAN: 89 mL/min/{1.73_m2} (ref >59)
Glucose: 96 mg/dL (ref 65–99)
POTASSIUM: 3.6 mmol/L (ref 3.5–5.2)
Sodium: 145 mmol/L — ABNORMAL HIGH (ref 134–144)

## 2017-10-14 NOTE — Assessment & Plan Note (Signed)
Assessment & Plan: Blood pressure initially elevated but normalized nicely on repeat check. Attributes initial elevation rushing to the appointment and was nervous she was going to be late. She reports compliance with Atenolol 50mg  and Lasix 80mg  daily without cramping, blurred vision or headache. -Continue current regimen of Atenolol 50mg  and Lasix 80mg  daily

## 2017-10-14 NOTE — Progress Notes (Signed)
   CC: follow-up of HTN, peripheral edema  HPI:  [name] is a 77 y.o. F with history of HTN and HFpEF who presents today for 6-week follow-up of HTN and peripheral edema.   For details regarding today's visit and the status of their chronic medical issues, please refer to the assessment and plan.  Past Medical History:  Diagnosis Date  . Abnormal electromyogram (EMG)    Low grade right S1 radiculopathy 7/93.  . Diastolic dysfunction    a. 08/2012 Echo: EF 60-65%, Gr 2 DD, triv MR, PASP 44mmHg.  [name]. Distal radius fracture, right 12/22/2015  . DJD (degenerative joint disease)   . Elevated alkaline phosphatase level    Bone scan 08/2004 showed no evidence of abnormal bony activity..  . Herniated lumbar intervertebral disc    S/P L4-5 & L5-S1 laminotomy, foraminotomy, and L5-S1 diskectomy by Dr. [name] 11/01/2000.  [name]. Hyperlipemia   . Hypertension   . Iron deficiency anemia   . Osteopenia    A DEXA bone density scan done 02/13/2009 showed a left femur neck young adult T-score of -1.6 (osteopenia), a right femur neck young adult T-score of -1.6 (osteopenia), and an AP spine young adult T-score of 1.3 (normal).  Her FRAX score gave an estimated 10 year probability of 6.6% for major osteoporotic fracture and 0.8% for hip fracture.  . Peripheral neuropathy   . Rotator cuff syndrome of right shoulder    S/P right shoulder arthroscopic debridement of a massive rotator cuff tear, greater tuberosity and resection of subacromial spur by Dr. [name] 07/07/2005.  . Stroke (HCC) 04/2013  . SVT (supraventricular tachycardia) (HCC)    PSVT  . Vitamin D deficiency    Review of Systems:   General: Denies fevers, chills, headache Cardiac: Denies CP, SOB, DOE Pulmonary: Denies cough, PND or orthopnea (2 thin pillows) Extremities: Admits to improved LE edema, but not back to baseline. No cramping or weakness.   Physical Exam: General: Alert, in no acute distress. Pleasant and  conversant HEENT: No icterus, injection or ptosis. No hoarseness or dysarthria  Cardiac: RRR, no murmur Pulmonary: CTA BL with normal WOB on RA. Able to speak in complete sentences Extremities: Warm, perfused. 2+ pitting edema BL.   Vitals:   10/13/17 1050 10/13/17 1104  BP: (!) 160/74 122/73  Pulse: 88 75  Temp: 98.2 F (36.8 C)   TempSrc: Oral   Weight: 218 lb 1.6 oz (98.9 kg)   Height: 5\' 7"  (1.702 m)    Body mass index is 34.16 kg/m.  Assessment & Plan:   See Encounters Tab for problem based charting.  Patient discussed with Dr. Cyndie ChimeGranfortuna

## 2017-10-14 NOTE — Assessment & Plan Note (Addendum)
Assessment: Wt 218 lbs, down 5 lbs since her visit 6-weeks ago. Her BL weight actually seems to be around ~207. She feels leg swelling has improved, but not near her baseline level of edema. She remains grossly volume-up with 2+ pitting edema of BL LE. Denies any respiratory symptoms, cramping or weakness.   Plan: Remains grossly volume-up and weight seems about 10 lbs up from baseline. Her BP did improve with diuresis but I think she might benefit from continued diuresis to get back to BL. BMET today shows stable lytes and renal function.  -Continue Lasix 80mg  daily -RTC 6 weeks for continued follow-up

## 2017-10-16 NOTE — Progress Notes (Signed)
Medicine attending: Medical history, presenting problems, physical findings, and medications, reviewed with resident physician Dr Bethany Molt on the day of the patient visit and I concur with her evaluation and management plan. 

## 2017-12-21 ENCOUNTER — Ambulatory Visit: Payer: Self-pay | Admitting: Adult Health

## 2017-12-21 ENCOUNTER — Encounter: Payer: Self-pay | Admitting: Adult Health

## 2017-12-21 ENCOUNTER — Telehealth: Payer: Self-pay

## 2017-12-21 ENCOUNTER — Ambulatory Visit: Payer: Self-pay | Admitting: Neurology

## 2017-12-21 NOTE — Telephone Encounter (Signed)
Patient call phone room about she will not make it in time for appt. PT did not attend appt today.

## 2017-12-21 NOTE — Progress Notes (Deleted)
PATIENT: [name] DOB: 03-24-1941  REASON FOR VISIT: follow up- history of stroke HISTORY FROM: patient  HISTORY OF PRESENT ILLNESS: [name] is a 77 year old female with a history of cerebrovascular disease and stroke. She returns today for an evaluation. The patient's primary care provider manages her cholesterol and hypertension. Today her blood pressure is good-it is 120/71. The patient is on atorvastatin for her cholesterol. Her recent lipid panel in March showed that her LDL was 80. The patient denies any new neurological symptoms. Denies any strokelike symptoms. She continues on Plavix for stroke prevention. She will occasionally have numbness in the left hand- this has been ongoing. The patient had carotid Doppler studies in February 2016 that were unremarkable. The patient reports that she does have some trouble with the right knee she has been seen orthopedic who has recommended knee replacement. She returns today for an evaluation.  HISTORY 07/23/14 ([name]):77 year [name] lady seen for first office followup visit following hospital admission on 05/29/13 for stroke. She presented with sudden onset of left-sided numbness. She actually had a similar presentation 3 weeks ago in November and that time she is admitted fibrillation for stroke but no acute infarct and bilateral old basal ganglia and thalamic lacunar infarcts are noted. During the current admission she was found to have an acute left thalamic and right pontine infarct. Etiology was found to be small vessel disease. MRI of the brain showed no large vessel occlusion. Transthoracic echo showed normal ejection fraction without context of embolism. Lipid profile showed total cholesterol 205, triglycerides 68, HDL 59 and LDL 132 mg percent. She was started on atorvastatin and Plavix. She still should done well since discharge he still had some intermittent left face and hand tingling and numbness from time to time which is  getting better but does not completely improve. She states her blood pressure is well controlled with a slightly elevated in office today. She is tolerating Plavix without bleeding or bruising as well as Lipitor without any side effects. She plans to go on diet, lose weight and exercise regularly. She has no new complaints today.   UPDATE 11/16/13 (LL): [name] returns to the office for stroke followup. She has been doing well, no new neurovascular symptoms. Her blood pressure is well controlled, it is 120/73 in the office today. She has an appt at her PCP in June to have blood work checked, will have lipids checked then. She is tolerating statin and Plavix well without any known side effects. Still having some intermittent hand tingling and numbness occasionally. No new complaints. Update 07/23/2014 : She returns for follow-up after last visit 7 months ago. She continues to do well without recurrent stroke or TIA symptoms. She at has some intermittent numbness in the left hand particularly when she is tired but it is not bothersome. She is tolerating Plavix well without significant bleeding, bruising or other side effects. She states she had lab work done in the primary care physician's office but I do not have the results for my review today. Her blood pressure is quite good and today it is 122/73 in office. She has some walking difficulties mainly related to her right knee pain. No other new neurological issues.  Update 12/22/2015 : She returns follow-up today after last visit on 01/22/15. She is admitted in April 2017 with episode of headache as well as left face lip and hand numbness and tingling. MRI scan of the brain showed tiny right parietal white  matter lacunar infarct. I have personally reviewed the images and MRI scan. MRA of the brain showed no large vessel stenosis and MRA of the neck show only minor atheromatous changes at left carotid bifurcation. Transthoracic echo showed normal ejection  fraction. She had subsequent outpatient 48-hour Holter monitor which showed no significant arrhythmias. LDL cholesterol is mildly elevated at 104. Patient was on Plavix and baby aspirin was added for 3 months and she is tolerating it well with minor bruising but no bleeding episodes. She remains on Lipitor 80 mg and is tolerating well with the muscle aches or pains. Blood pressure is well controlled and today it is 1-0/72. She still has some intermittent numbness on the left face, cheek and fingertips but this is not bothersome and she notices only when she is tired. She is more bothered by the right knee pain from arthritis she walks slowly with a cane. She is thinking about having knee replacement done but has not yet made up her mind. Update 12/20/2016 ; she returns for follow-up after last visit a year ago. She states she has had no recurrent stroke or TIA symptoms. She had a fall and fractured her right wrist requiring a cast but no surgery. She is doing better now. She remains on Plavix which is tolerating well without bruising or bleeding. She states her blood pressure is well controlled and today it is 136/81. She remains on Lipitor which she is tolerating well without side effects and states her last lipid profile checked by primary physician in May this year was satisfactory. She is bothered by right knee pain since thinking about surgery. She has not had follow-up carotid ultrasound done this year. She has no new neurological complaints.  12/21/17 UPDATE:    REVIEW OF SYSTEMS: Out of a complete 14 system review of symptoms, the patient complains only of the following symptoms, and all other reviewed systems are negative.: Ringing in the ears, leg swelling, joint swelling, numbness  ALLERGIES: Allergies  Allergen Reactions  . Sulfonamide Derivatives Shortness Of Breath  . Penicillins Swelling and Other (See Comments)    REACTION: Local swelling in right arm after injection. Has patient had a PCN  reaction causing immediate rash, facial/tongue/throat swelling, SOB or lightheadedness with hypotension:No Has patient had a PCN reaction causing severe rash involving mucus membranes or skin necrosis:No Has patient had a PCN reaction that required hospitalization:No Has patient had a PCN reaction occurring within the last 10 years:no If all of the above answers are "NO", then may proceed with Cephalosporin use.     HOME MEDICATIONS: Outpatient Medications Prior to Visit  Medication Sig Dispense Refill  . alendronate (FOSAMAX) 70 MG tablet TAKE 1 TABLET EVERY 7 DAYS WITH A FULL GLASS OF WATER ON AN EMPTY STOMACH 12 tablet 3  . atenolol (TENORMIN) 50 MG tablet TAKE 1 TABLET (50 MG TOTAL) BY MOUTH DAILY. 90 tablet 3  . atorvastatin (LIPITOR) 80 MG tablet TAKE 1 TABLET BY MOUTH EVERY DAY AT 6PM 90 tablet 2  . Calcium Carbonate-Vitamin D 600-400 MG-UNIT tablet TAKE 1 TABLET BY MOUTH 2 (TWO) TIMES DAILY. 60 tablet 5  . clopidogrel (PLAVIX) 75 MG tablet TAKE 1 TABLET EVERY DAY 90 tablet 3  . furosemide (LASIX) 40 MG tablet Take 2 tablets (80 mg total) by mouth daily. 180 tablet 1  . KLOR-CON M20 20 MEQ tablet TAKE 1 TABLET (20 MEQ TOTAL) BY MOUTH DAILY. 90 tablet 2  . traMADol (ULTRAM) 50 MG tablet Take 1 tablet (50  mg total) by mouth daily as needed. 30 tablet 2   No facility-administered medications prior to visit.     PAST MEDICAL HISTORY: Past Medical History:  Diagnosis Date  . Abnormal electromyogram (EMG)    Low grade right S1 radiculopathy 7/93.  . Diastolic dysfunction    a. 08/2012 Echo: EF 60-65%, Gr 2 DD, triv MR, PASP .  [name] Distal radius fracture, right 12/22/2015  . DJD (degenerative joint disease)   . Elevated alkaline phosphatase level    Bone scan 08/2004 showed no evidence of abnormal bony activity..  . Herniated lumbar intervertebral disc    S/P L4-5 & L5-S1 laminotomy, foraminotomy, and L5-S1 diskectomy by Dr. Trey Sailors 11/01/2000.  [name] Hyperlipemia   . Hypertension     . Iron deficiency anemia   . Osteopenia    A DEXA bone density scan done 02/13/2009 showed a left femur neck young adult T-score of -1.6 (osteopenia), a right femur neck young adult T-score of -1.6 (osteopenia), and an AP spine young adult T-score of 1.3 (normal).  Her FRAX score gave an estimated 10 year probability of 6.6% for major osteoporotic fracture and 0.8% for hip fracture.  . Peripheral neuropathy   . Rotator cuff syndrome of right shoulder    S/P right shoulder arthroscopic debridement of a massive rotator cuff tear, greater tuberosity and resection of subacromial spur by Dr. Gean Birchwood 07/07/2005.  . Stroke (HCC) 04/2013  . SVT (supraventricular tachycardia) (HCC)    PSVT  . Vitamin D deficiency     PAST SURGICAL HISTORY: Past Surgical History:  Procedure Laterality Date  . BACK SURGERY    . LAPAROSCOPIC CHOLECYSTECTOMY  1998  . LUMBAR DISC SURGERY  11/01/2000    L4-5, L5-S1 laminotomy, foraminotomy and L5-S1 diskectomy  . SHOULDER ARTHROSCOPY  [id number]/172007   S/P right shoulder arthroscopic debridement of a massive rotator cuff tear, greater tuberoplasty and resection of subacromial spur by Dr. Gean Birchwood on 07/07/2005.  [name] TOTAL ABDOMINAL HYSTERECTOMY  1992    FAMILY HISTORY: Family History  Problem Relation Age of Onset  . Ovarian cancer Mother 86  . Hypertension Mother   . Heart attack Mother 27  . Cancer Brother   . Hypertension Sister   . Hypertension Sister   . Neuropathy Sister   . Breast cancer Neg Hx   . Colon cancer Neg Hx   . Lung cancer Neg Hx     SOCIAL HISTORY: Social History   Socioeconomic History  . Marital status: Single    Spouse name: Not on file  . Number of children: 6  . Years of education: 121th  . Highest education level: Not on file  Occupational History  . Occupation: retired  Engineer, production  . Financial resource strain: Not on file  . Food insecurity:    Worry: Not on file    Inability: Not on file  . Transportation needs:     Medical: Not on file    Non-medical: Not on file  Tobacco Use  . Smoking status: Former Smoker    Packs/day: 0.15    Years: 2.00    Pack years: 0.30    Types: Cigarettes    Last attempt to quit: 10/07/1999    Years since quitting: 18.2  . Smokeless tobacco: Never Used  Substance and Sexual Activity  . Alcohol use: No    Alcohol/week: 0.0 oz  . Drug use: No  . Sexual activity: Not Currently  Lifestyle  . Physical activity:    Days  per week: Not on file    Minutes per session: Not on file  . Stress: Not on file  Relationships  . Social connections:    Talks on phone: Not on file    Gets together: Not on file    Attends religious service: Not on file    Active member of club or organization: Not on file    Attends meetings of clubs or organizations: Not on file    Relationship status: Not on file  . Intimate partner violence:    Fear of current or ex partner: Not on file    Emotionally abused: Not on file    Physically abused: Not on file    Forced sexual activity: Not on file  Other Topics Concern  . Not on file  Social History Narrative   Patient is single and lives at home alone.    Disabled.   Education high school.   Right handed.   Caffeine sometimes one cup of coffee.      PHYSICAL EXAM  There were no vitals filed for this visit. There is no height or weight on file to calculate BMI.  Generalized: Obese elderly African-American lady in no acute distress . Bilateral lower extremity edema  Neurological examination  Mentation: Alert oriented to time, place, history taking. Follows all commands speech and language fluent Cranial nerve II-XII: Pupils were equal round reactive to light. Extraocular movements were full, visual field were full on confrontational test. Facial sensation and strength were normal. Uvula tongue midline. Head turning and shoulder shrug  were normal and symmetric. Motor: The motor testing reveals 5 over 5 strength of all 4 extremities.  Good symmetric motor tone is noted throughout.  Sensory: Sensory testing is intact to soft touch on all 4 extremities. No evidence of extinction is noted.  Coordination: Cerebellar testing reveals good finger-nose-finger and heel-to-shin bilaterally.  Gait and station: Gait is antalgic and favors the right knee due to pain and uses a cane. Tandem gait is not possible Romberg is negative. No drift is seen.  Reflexes: Deep tendon reflexes are symmetric and normal bilaterally.   DIAGNOSTIC DATA (LABS, IMAGING, TESTING) - I reviewed patient records, labs, notes, testing and imaging myself where available.  Lab Results  Component Value Date   WBC 4.6 10/11/2016   HGB 11.7 10/11/2016   HCT 35.4 10/11/2016   MCV 85 10/11/2016   PLT 157 10/11/2016      Component Value Date/Time   NA 145 (H) 10/13/2017 1119   K 3.6 10/13/2017 1119   CL 105 10/13/2017 1119   CO2 26 10/13/2017 1119   GLUCOSE 96 10/13/2017 1119   GLUCOSE 106 (H) 12/25/2015 0350   BUN 15 10/13/2017 1119   CREATININE 0.59 10/13/2017 1119   CREATININE 0.66 08/22/2014 0925   CALCIUM 9.7 10/13/2017 1119   PROT 6.2 (L) 09/27/2015 1746   ALBUMIN 3.6 09/27/2015 1746   AST 16 09/27/2015 1746   ALT 13 (L) 09/27/2015 1746   ALKPHOS 101 09/27/2015 1746   BILITOT 1.1 09/27/2015 1746   GFRNONAA 89 10/13/2017 1119   GFRNONAA 88 08/22/2014 0925   GFRAA 103 10/13/2017 1119   GFRAA >89 08/22/2014 0925   Lab Results  Component Value Date   CHOL 166 09/28/2015   HDL 52 09/28/2015   LDLCALC 104 (H) 09/28/2015   TRIG 50 09/28/2015   CHOLHDL 3.2 09/28/2015   Lab Results  Component Value Date   HGBA1C 4.7 (L) 10/11/2016   Lab Results  Component  Value Date   VITAMINB12 1,149 (H) 09/28/2015   Lab Results  Component Value Date   TSH 2.260 09/05/2017     VAS US CAROTID DUPLEX BILATERAL 01/18/17 Summary: Bilateral - 1% to 39% ICA stenosis. vertebral artery flow is antegrade.   ASSESSMENT AND PLAN 77 y.o. year old female   has a past medical history of Abnormal electromyogram (EMG), Diastolic dysfunction, Distal radius fracture, right (12/22/2015), DJD (degenerative joint disease), Elevated alkaline phosphatase level, Herniated lumbar intervertebral disc, Hyperlipemia, Hypertension, Iron deficiency anemia, Osteopenia, Peripheral neuropathy, Rotator cuff syndrome of right shoulder, Stroke (HCC) (04/2013), SVT (supraventricular tachycardia) (HCC), and Vitamin D deficiency. here with:  follow-up visit following recent admission for right pareital lacunar stroke in April 2017 from small vessel disease    Continue clopidogrel 75 mg daily  and ***  for secondary stroke prevention  Continue to follow up with PCP regarding *** and *** management   Continue to monitor blood pressure at home  Maintain strict control of hypertension with blood pressure goal below 130/90, diabetes with hemoglobin A1c goal below 6.5% and cholesterol with LDL cholesterol (bad cholesterol) goal below 70 mg/dL. I also advised the patient to eat a healthy diet with plenty of whole grains, cereals, fruits and vegetables, exercise regularly and maintain ideal body weight.  Followup in the future with me in *** or call earlier if needed   Check follow-up carotid ultrasound study.   Greater than 50% time during this 25 minute visit was spent on counseling and coordination of care stroke and TIA, prevention and treatment option discussion answering questions Followup in the future with me in  1 year or call earlier if necessary

## 2017-12-30 ENCOUNTER — Ambulatory Visit (INDEPENDENT_AMBULATORY_CARE_PROVIDER_SITE_OTHER): Payer: Medicare Other | Admitting: Adult Health

## 2017-12-30 ENCOUNTER — Encounter: Payer: Self-pay | Admitting: Adult Health

## 2017-12-30 VITALS — BP 142/80 | HR 78 | Ht 67.0 in | Wt 230.0 lb

## 2017-12-30 DIAGNOSIS — E7800 Pure hypercholesterolemia, unspecified: Secondary | ICD-10-CM | POA: Diagnosis not present

## 2017-12-30 DIAGNOSIS — Z8673 Personal history of transient ischemic attack (TIA), and cerebral infarction without residual deficits: Secondary | ICD-10-CM

## 2017-12-30 DIAGNOSIS — I10 Essential (primary) hypertension: Secondary | ICD-10-CM

## 2017-12-30 NOTE — Patient Instructions (Signed)
Continue clopidogrel 75 mg daily  and lipitor  for secondary stroke prevention  Continue to follow up with PCP regarding blood pressure and cholesterol management   Continue to monitor blood pressure at home  Continue to stay active and eat a healthy diet  Continue to wear compression stockings to increased swelling in your legs and raise them while you are sitting  Maintain strict control of hypertension with blood pressure goal below 130/90, diabetes with hemoglobin A1c goal below 6.5% and cholesterol with LDL cholesterol (bad cholesterol) goal below 70 mg/dL. I also advised the patient to eat a healthy diet with plenty of whole grains, cereals, fruits and vegetables, exercise regularly and maintain ideal body weight.  Followup in the future with me as needed or call earlier if needed       Thank you for [facility]. I hope we have been able to provide you high quality care today.  You may receive a patient satisfaction survey over the next few weeks. We would appreciate your feedback and comments so that we may continue to improve ourselves and the health of our patients.

## 2017-12-30 NOTE — Progress Notes (Signed)
PATIENT: [name] DOB: 1941/05/03  REASON FOR VISIT: follow up- history of stroke HISTORY FROM: patient  HISTORY OF PRESENT ILLNESS: [name] is a 77 year old female with a history of cerebrovascular disease and stroke. She returns today for an evaluation. The patient's primary care provider manages her cholesterol and hypertension. Today her blood pressure is good-it is 120/71. The patient is on atorvastatin for her cholesterol. Her recent lipid panel in March showed that her LDL was 80. The patient denies any new neurological symptoms. Denies any strokelike symptoms. She continues on Plavix for stroke prevention. She will occasionally have numbness in the left hand- this has been ongoing. The patient had carotid Doppler studies in February 2016 that were unremarkable. The patient reports that she does have some trouble with the right knee she has been seen orthopedic who has recommended knee replacement. She returns today for an evaluation.  HISTORY 07/23/14 ([name]):[name] seen for first office followup visit following hospital admission on 05/29/13 for stroke. She presented with sudden onset of left-sided numbness. She actually had a similar presentation 3 weeks ago in November and that time she is admitted fibrillation for stroke but no acute infarct and bilateral old basal ganglia and thalamic lacunar infarcts are noted. During the current admission she was found to have an acute left thalamic and right pontine infarct. Etiology was found to be small vessel disease. MRI of the brain showed no large vessel occlusion. Transthoracic echo showed normal ejection fraction without context of embolism. Lipid profile showed total cholesterol 205, triglycerides 68, HDL 59 and LDL 132 mg percent. She was started on atorvastatin and Plavix. She still should done well since discharge he still had some intermittent left face and hand tingling and numbness from time to time which is  getting better but does not completely improve. She states her blood pressure is well controlled with a slightly elevated in office today. She is tolerating Plavix without bleeding or bruising as well as Lipitor without any side effects. She plans to go on diet, lose weight and exercise regularly. She has no new complaints today.   UPDATE 11/16/13 (LL): [name] returns to the office for stroke followup. She has been doing well, no new neurovascular symptoms. Her blood pressure is well controlled, it is 120/73 in the office today. She has an appt at her PCP in June to have blood work checked, will have lipids checked then. She is tolerating statin and Plavix well without any known side effects. Still having some intermittent hand tingling and numbness occasionally. No new complaints. 07/23/2014 visit PS : She returns for follow-up after last visit 7 months ago. She continues to do well without recurrent stroke or TIA symptoms. She at has some intermittent numbness in the left hand particularly when she is tired but it is not bothersome. She is tolerating Plavix well without significant bleeding, bruising or other side effects. She states she had lab work done in the primary care physician's office but I do not have the results for my review today. Her blood pressure is quite good and today it is 122/73 in office. She has some walking difficulties mainly related to her right knee pain. No other new neurological issues.  12/22/2015 visit PS : She returns follow-up today after last visit on 01/22/15. She is admitted in April 2017 with episode of headache as well as left face lip and hand numbness and tingling. MRI scan of the brain showed tiny right  parietal white matter lacunar infarct. I have personally reviewed the images and MRI scan. MRA of the brain showed no large vessel stenosis and MRA of the neck show only minor atheromatous changes at left carotid bifurcation. Transthoracic echo showed normal ejection  fraction. She had subsequent outpatient 48-hour Holter monitor which showed no significant arrhythmias. LDL cholesterol is mildly elevated at 104. Patient was on Plavix and baby aspirin was added for 3 months and she is tolerating it well with minor bruising but no bleeding episodes. She remains on Lipitor 80 mg and is tolerating well with the muscle aches or pains. Blood pressure is well controlled and today it is 1-0/72. She still has some intermittent numbness on the left face, cheek and fingertips but this is not bothersome and she notices only when she is tired. She is more bothered by the right knee pain from arthritis she walks slowly with a cane. She is thinking about having knee replacement done but has not yet made up her mind. 12/20/2016 visit PS ; she returns for follow-up after last visit a year ago. She states she has had no recurrent stroke or TIA symptoms. She had a fall and fractured her right wrist requiring a cast but no surgery. She is doing better now. She remains on Plavix which is tolerating well without bruising or bleeding. She states her blood pressure is well controlled and today it is 136/81. She remains on Lipitor which she is tolerating well without side effects and states her last lipid profile checked by primary physician in May this year was satisfactory. She is bothered by right knee pain since thinking about surgery. She has not had follow-up carotid ultrasound done this year. She has no new neurological complaints.  12/30/17 UPDATE: Patient is being seen today for one-year follow-up and is accompanied by her daughter.  She continues to have intermittent left finger numbness and left lip numbness but overall has been doing well.  She continues to take Lipitor without side effects myalgias.  Continues to take Plavix without bruising but did have 2 episodes for the past year of minimal bright red blood on tissue paper after bowel movement.  Blood pressure initially 177/95 but  rechecked manually towards the end of appointment and was at 142/80.  Patient does monitor this at home and typically 130s over 80s.  PCP has been monitoring cholesterol levels and per patient, recent labs in May 2019 and was told that all labs within normal limits.  Patient continues to do all activities except for driving and lives independently.  Denies new or worsening stroke/TIA symptoms at this time.   REVIEW OF SYSTEMS: Out of a complete 14 system review of symptoms, the patient complains only of the following symptoms, and all other reviewed systems are negative.:  Leg swelling, joint swelling and rectal bleeding  ALLERGIES: Allergies  Allergen Reactions  . Sulfonamide Derivatives Shortness Of Breath  . Penicillins Swelling and Other (See Comments)    REACTION: Local swelling in right arm after injection. Has patient had a PCN reaction causing immediate rash, facial/tongue/throat swelling, SOB or lightheadedness with hypotension:No Has patient had a PCN reaction causing severe rash involving mucus membranes or skin necrosis:No Has patient had a PCN reaction that required hospitalization:No Has patient had a PCN reaction occurring within the last 10 years:no If all of the above answers are "NO", then may proceed with Cephalosporin use.     HOME MEDICATIONS: Outpatient Medications Prior to Visit  Medication Sig Dispense Refill  .  alendronate (FOSAMAX) 70 MG tablet TAKE 1 TABLET EVERY 7 DAYS WITH A FULL GLASS OF WATER ON AN EMPTY STOMACH 12 tablet 3  . atenolol (TENORMIN) 50 MG tablet TAKE 1 TABLET (50 MG TOTAL) BY MOUTH DAILY. 90 tablet 3  . atorvastatin (LIPITOR) 80 MG tablet TAKE 1 TABLET BY MOUTH EVERY DAY AT 6PM 90 tablet 2  . Calcium Carbonate-Vitamin D 600-400 MG-UNIT tablet TAKE 1 TABLET BY MOUTH 2 (TWO) TIMES DAILY. 60 tablet 5  . clopidogrel (PLAVIX) 75 MG tablet TAKE 1 TABLET EVERY DAY 90 tablet 3  . furosemide (LASIX) 40 MG tablet Take 2 tablets (80 mg total) by mouth  daily. 180 tablet 1  . KLOR-CON M20 20 MEQ tablet TAKE 1 TABLET (20 MEQ TOTAL) BY MOUTH DAILY. 90 tablet 2  . traMADol (ULTRAM) 50 MG tablet Take 1 tablet (50 mg total) by mouth daily as needed. 30 tablet 2   No facility-administered medications prior to visit.     PAST MEDICAL HISTORY: Past Medical History:  Diagnosis Date  . Abnormal electromyogram (EMG)    Low grade right S1 radiculopathy 7/93.  . Diastolic dysfunction    a. 08/2012 Echo: EF 60-65%, Gr 2 DD, triv MR, PASP .  [name] Distal radius fracture, right 12/22/2015  . DJD (degenerative joint disease)   . Elevated alkaline phosphatase level    Bone scan 08/2004 showed no evidence of abnormal bony activity..  . Herniated lumbar intervertebral disc    S/P L4-5 & L5-S1 laminotomy, foraminotomy, and L5-S1 diskectomy by Dr. Trey Sailors 11/01/2000.  [name] Hyperlipemia   . Hypertension   . Iron deficiency anemia   . Osteopenia    A DEXA bone density scan done 02/13/2009 showed a left femur neck young adult T-score of -1.6 (osteopenia), a right femur neck young adult T-score of -1.6 (osteopenia), and an AP spine young adult T-score of 1.3 (normal).  Her FRAX score gave an estimated 10 year probability of 6.6% for major osteoporotic fracture and 0.8% for hip fracture.  . Peripheral neuropathy   . Rotator cuff syndrome of right shoulder    S/P right shoulder arthroscopic debridement of a massive rotator cuff tear, greater tuberosity and resection of subacromial spur by Dr. Gean Birchwood 07/07/2005.  . Stroke (HCC) 04/2013  . SVT (supraventricular tachycardia) (HCC)    PSVT  . Vitamin D deficiency     PAST SURGICAL HISTORY: Past Surgical History:  Procedure Laterality Date  . BACK SURGERY    . LAPAROSCOPIC CHOLECYSTECTOMY  1998  . LUMBAR DISC SURGERY  11/01/2000    L4-5, L5-S1 laminotomy, foraminotomy and L5-S1 diskectomy  . SHOULDER ARTHROSCOPY  1/172007   S/P right shoulder arthroscopic debridement of a massive rotator cuff tear, greater  tuberoplasty and resection of subacromial spur by Dr. Gean Birchwood on 07/07/2005.  [name] TOTAL ABDOMINAL HYSTERECTOMY  1992    FAMILY HISTORY: Family History  Problem Relation Age of Onset  . Ovarian cancer Mother 64  . Hypertension Mother   . Heart attack Mother 68  . Cancer Brother   . Hypertension Sister   . Hypertension Sister   . Neuropathy Sister   . Breast cancer Neg Hx   . Colon cancer Neg Hx   . Lung cancer Neg Hx     SOCIAL HISTORY: Social History   Socioeconomic History  . Marital status: Single    Spouse name: Not on file  . Number of children: 6  . Years of education: 121th  . Highest education level:  Not on file  Occupational History  . Occupation: retired  Engineer, productionocial Needs  . Financial resource strain: Not on file  . Food insecurity:    Worry: Not on file    Inability: Not on file  . Transportation needs:    Medical: Not on file    Non-medical: Not on file  Tobacco Use  . Smoking status: Former Smoker    Packs/day: 0.15    Years: 2.00    Pack years: 0.30    Types: Cigarettes    Last attempt to quit: 10/07/1999    Years since quitting: 18.2  . Smokeless tobacco: Never Used  Substance and Sexual Activity  . Alcohol use: No    Alcohol/week: 0.0 oz  . Drug use: No  . Sexual activity: Not Currently  Lifestyle  . Physical activity:    Days per week: Not on file    Minutes per session: Not on file  . Stress: Not on file  Relationships  . Social connections:    Talks on phone: Not on file    Gets together: Not on file    Attends religious service: Not on file    Active member of club or organization: Not on file    Attends meetings of clubs or organizations: Not on file    Relationship status: Not on file  . Intimate partner violence:    Fear of current or ex partner: Not on file    Emotionally abused: Not on file    Physically abused: Not on file    Forced sexual activity: Not on file  Other Topics Concern  . Not on file  Social History Narrative     Patient is single and lives at home alone.    Disabled.   Education high school.   Right handed.   Caffeine sometimes one cup of coffee.      PHYSICAL EXAM  Vitals:   12/30/17 0846 12/30/17 0912  BP: (!) 177/95 (!) 142/80  Pulse: 78   Weight: 230 lb (104.3 kg)   Height: 5\' 7"  (1.702 m)    Body mass index is 36.02 kg/m.  Generalized: Obese elderly African-American lady in no acute distress .  +3 pitting edema bilateral lower extremities  Neurological examination  Mentation: Alert oriented to time, place, history taking. Follows all commands speech and language fluent Cranial nerve II-XII: Pupils were equal round reactive to light. Extraocular movements were full, visual field were full on confrontational test. Facial sensation and strength were normal. Uvula tongue midline. Head turning and shoulder shrug  were normal and symmetric. Motor: The motor testing reveals 5 over 5 strength of all 4 extremities. Good symmetric motor tone is noted throughout.  Sensory: Sensory testing is intact to soft touch on all 4 extremities. No evidence of extinction is noted.  Coordination: Cerebellar testing reveals good finger-nose-finger and heel-to-shin bilaterally.  Gait and station: Gait is antalgic and favors the right knee due to pain and uses a cane. Tandem gait is not possible Romberg is negative. No drift is seen.  Reflexes: Deep tendon reflexes are symmetric and normal bilaterally.   DIAGNOSTIC DATA (LABS, IMAGING, TESTING) - I reviewed patient records, labs, notes, testing and imaging myself where available.  Lab Results  Component Value Date   WBC 4.6 10/11/2016   HGB 11.7 10/11/2016   HCT 35.4 10/11/2016   MCV 85 10/11/2016   PLT 157 10/11/2016      Component Value Date/Time   NA 145 (H) 10/13/2017 1119  K 3.6 10/13/2017 1119   CL 105 10/13/2017 1119   CO2 26 10/13/2017 1119   GLUCOSE 96 10/13/2017 1119   GLUCOSE 106 (H) 12/25/2015 0350   BUN 15 10/13/2017 1119    CREATININE 0.59 10/13/2017 1119   CREATININE 0.66 08/22/2014 0925   CALCIUM 9.7 10/13/2017 1119   PROT 6.2 (L) 09/27/2015 1746   ALBUMIN 3.6 09/27/2015 1746   AST 16 09/27/2015 1746   ALT 13 (L) 09/27/2015 1746   ALKPHOS 101 09/27/2015 1746   BILITOT 1.1 09/27/2015 1746   GFRNONAA 89 10/13/2017 1119   GFRNONAA 88 08/22/2014 0925   GFRAA 103 10/13/2017 1119   GFRAA >89 08/22/2014 0925   Lab Results  Component Value Date   CHOL 166 09/28/2015   HDL 52 09/28/2015   LDLCALC 104 (H) 09/28/2015   TRIG 50 09/28/2015   CHOLHDL 3.2 09/28/2015   Lab Results  Component Value Date   HGBA1C 4.7 (L) 10/11/2016   Lab Results  Component Value Date   VITAMINB12 1,149 (H) 09/28/2015   Lab Results  Component Value Date   TSH 2.260 09/05/2017      ASSESSMENT AND PLAN 77 y.o. year old female  has a past medical history of Abnormal electromyogram (EMG), Diastolic dysfunction, Distal radius fracture, right (12/22/2015), DJD (degenerative joint disease), Elevated alkaline phosphatase level, Herniated lumbar intervertebral disc, Hyperlipemia, Hypertension, Iron deficiency anemia, Osteopenia, Peripheral neuropathy, Rotator cuff syndrome of right shoulder, Stroke (HCC) (04/2013), SVT (supraventricular tachycardia) (HCC), and Vitamin D deficiency. here with:  follow-up visit following right pareital lacunar stroke in April 2017 from small vessel disease.  Patient returns today for follow-up visit and overall doing well.    -Continue clopidogrel 75 mg daily  and Lipitor for secondary stroke prevention -F/u with PCP regarding your HLD, and HTN management -continue to monitor BP at home -Advised patient to continue to wear compression stockings and raise legs while sitting due to bilateral lower extremity edema -Maintain strict control of hypertension with blood pressure goal below 130/90, diabetes with hemoglobin A1c goal below 6.5% and cholesterol with LDL cholesterol (bad cholesterol) goal below 70  mg/dL. I also advised the patient to eat a healthy diet with plenty of whole grains, cereals, fruits and vegetables, exercise regularly and maintain ideal as needed body weight.  Follow up  or call earlier if needed  Greater than 50% of time during this 25 minute visit was spent on counseling,explanation of diagnosis of history of stroke, reviewing risk factor management of HLD and HTN, planning of further management, discussion with patient and family and coordination of care  George Hugh, AGNP-BC  [facility] [address] [place], [place] [address]  Phone [phone] Fax [phone] Note: This document was prepared with digital dictation and possible smart phrase technology. Any transcriptional errors that result from this process are unintentional.

## 2017-12-30 NOTE — Progress Notes (Signed)
I agree with the above plan 

## 2018-03-05 ENCOUNTER — Other Ambulatory Visit: Payer: Self-pay | Admitting: Student in an Organized Health Care Education/Training Program

## 2018-03-11 ENCOUNTER — Other Ambulatory Visit: Payer: Self-pay | Admitting: Student in an Organized Health Care Education/Training Program

## 2018-03-27 ENCOUNTER — Telehealth: Payer: Self-pay | Admitting: Student in an Organized Health Care Education/Training Program

## 2018-03-27 ENCOUNTER — Ambulatory Visit (INDEPENDENT_AMBULATORY_CARE_PROVIDER_SITE_OTHER): Payer: Medicare Other | Admitting: Student in an Organized Health Care Education/Training Program

## 2018-03-27 ENCOUNTER — Other Ambulatory Visit: Payer: Self-pay

## 2018-03-27 ENCOUNTER — Encounter: Payer: Self-pay | Admitting: Student in an Organized Health Care Education/Training Program

## 2018-03-27 VITALS — BP 150/73 | HR 73 | Temp 98.9°F | Wt 234.6 lb

## 2018-03-27 DIAGNOSIS — M25461 Effusion, right knee: Secondary | ICD-10-CM

## 2018-03-27 DIAGNOSIS — M159 Polyosteoarthritis, unspecified: Secondary | ICD-10-CM

## 2018-03-27 DIAGNOSIS — Z23 Encounter for immunization: Secondary | ICD-10-CM

## 2018-03-27 DIAGNOSIS — I110 Hypertensive heart disease with heart failure: Secondary | ICD-10-CM | POA: Diagnosis not present

## 2018-03-27 DIAGNOSIS — I10 Essential (primary) hypertension: Secondary | ICD-10-CM

## 2018-03-27 DIAGNOSIS — M1711 Unilateral primary osteoarthritis, right knee: Secondary | ICD-10-CM

## 2018-03-27 DIAGNOSIS — R2681 Unsteadiness on feet: Secondary | ICD-10-CM

## 2018-03-27 DIAGNOSIS — I5032 Chronic diastolic (congestive) heart failure: Secondary | ICD-10-CM | POA: Diagnosis not present

## 2018-03-27 DIAGNOSIS — Z791 Long term (current) use of non-steroidal anti-inflammatories (NSAID): Secondary | ICD-10-CM

## 2018-03-27 DIAGNOSIS — Z79899 Other long term (current) drug therapy: Secondary | ICD-10-CM

## 2018-03-27 MED ORDER — LISINOPRIL 10 MG PO TABS: 10.0000 mg | ORAL_TABLET | Freq: Every day | ORAL | 2 refills | Status: DC

## 2018-03-27 NOTE — Progress Notes (Signed)
   Assessment and Plan:  See Encounters tab for problem-based medical decision making.   __________________________________________________________  HPI:   77 year old woman here for follow-up of hypertension.  Doing well independently at home.  Reports good tolerance with her medications with no adverse side effects.  No recent falls.  Uses a walker to get around her house.  Having increasing pain and stiffness in her right knee which is making it harder for her to get around.  Also increasing pain and stiffness in her back.  She recently purchased a back brace and is wearing it today.  Says it helps a little.  No other recent illnesses.  No fevers or chills.  Lives independently.  __________________________________________________________  Problem List: Patient Active Problem List   Diagnosis Date Noted  . Osteoporosis 04/03/2009    Priority: High  . Bilateral leg edema due to diastolic dysfunction 02/22/2007    Priority: High  . Chronic diastolic congestive heart failure (HCC) 09/28/2015    Priority: Medium  . Risk for falls 03/03/2015    Priority: Medium  . Anemia 05/01/2014    Priority: Medium  . Essential hypertension 09/14/2006    Priority: Medium  . Hyperlipidemia 04/27/2006    Priority: Medium  . Osteoarthritis 04/27/2006    Priority: Medium  . Lower back pain 04/25/2015    Priority: Low  . Healthcare maintenance 04/08/2014    Priority: Low  . Hyperglycemia 06/01/2012    Priority: Low  . Vitamin D deficiency 05/08/2010    Priority: Low    Medications: Reconciled today in Epic __________________________________________________________  Physical Exam:  Vital Signs: Vitals:   03/27/18 0909  BP: (!) 150/73  Pulse: 73  Temp: 98.9 F (37.2 C)  TempSrc: Oral  Weight: 234 lb 9.6 oz (106.4 kg)    Gen: Well appearing, NAD CV: RRR, no murmurs Pulm: Normal effort, CTA throughout, no wheezing Ext: Warm, 2+ pitting edema bilaterally MSK: Swollen right and left  knee, moderate effusion in the right knee, no significant crepitus.

## 2018-03-27 NOTE — Telephone Encounter (Signed)
Pt said that she received the flu shot today and also physician change her blood pressure medicine her eye is red, pls contact [phone]

## 2018-03-27 NOTE — Assessment & Plan Note (Signed)
Blood pressure continues to be controlled over the last few visits.  Plan is to start lisinopril 10 mg daily.  Continue with furosemide 80 mg daily and atenolol 50 mg daily.  Follow-up in 2 months for repeat blood pressure and BMP.

## 2018-03-27 NOTE — Telephone Encounter (Signed)
Pt calls and states she saw dr Oswaldo Done this am and got her flu shot, he also prescribed a new BP medicine. Since leaving the office she states the sclera of both eyes has gotten "blood red", when she first answered the ph she stated that they were not as dark red as they have been but then as she finished describing the eyes she stated they were getting dark red again. She also states they are "sore" and is having "some cold in the corners of both eyes" she states the drainage is thick and kind of yellow. She denies h/a, chest pain, short of breath, N&V, weakness, sweating. She states she has not started the new medicine yet and doesn't know if she should.  Please advise Sending to dr Oswaldo Done and attending

## 2018-03-27 NOTE — Patient Instructions (Signed)
Start a new medicine for your high blood pressure.  Take 1 pill a day of lisinopril.  Let me know if you have any problems with lightheadedness.  Come back to our acute care clinic in 1-2 weeks.  We can do an injection of steroids into your right knee to help with your arthritis.  Follow-up with me in 2 months to have your blood pressure rechecked and labs rechecked.

## 2018-03-27 NOTE — Assessment & Plan Note (Signed)
Continues to have mild volume overload with lower extremity edema.  This is stable, improving with compression stockings.  Continue Lasix 80 mg daily, co-prescribed with potassium supplement.  We are going to work on better hypertension control.

## 2018-03-27 NOTE — Telephone Encounter (Signed)
Spoke to dr Crista Elliot at 1700 after attendings could not be addressed, rtc to pt and instructed her to call 911 for any of the before noted symptoms and reassured her, will speak w/ dr Oswaldo Done in am and call pt by 0930

## 2018-03-27 NOTE — Addendum Note (Signed)
Addended by: Maura Crandall on: 03/27/2018 10:16 AM   Modules accepted: Orders

## 2018-03-27 NOTE — Assessment & Plan Note (Signed)
Osteoarthritis of multiple joints, right knee is most bothersome to her right now.  Very slow and unstable gait.  Using a cane today.  Has a rolling walker at home.  I encouraged her to use that more often.  Also wearing a back brace which I discouraged.  We talked about injection into the right knee with steroids as possible symptomatic improvement.  She follows with [facility] but wants to defer knee replacement surgery.  She can follow-up with our clinic in 1-2 weeks for a right knee steroid injection.  For now continue with meloxicam as needed on bad days, usually uses a couple tablets per month.

## 2018-03-28 NOTE — Telephone Encounter (Signed)
Symptoms consistent with Oculorespiratory Syndrome due to high-dose flu shot. I spoke with her this morning and she is feeling better. I told her to expect that this will resolve within 48 hours. I made a note in her allergy list, in the future we will only give her the regular dose flu shot.

## 2018-03-29 NOTE — Telephone Encounter (Signed)
Follow up call made to patient-states she feel better than she did yesterday.  Pt has an appt scheduled for Fri 10/11 for joint injection(knee), but would like to change the appt to another day.  Pt needs to come when Oswaldo Done is attending. Pt was given appt date/time options for the next 2 weeks.  Pt would like to speak with her dtr prior to getting an appt time/date.  Pt will call back this afternoon.Kingsley Spittle Cassady10/9/201911:56 AM

## 2018-03-31 ENCOUNTER — Ambulatory Visit: Payer: Self-pay

## 2018-04-04 ENCOUNTER — Telehealth: Payer: Self-pay | Admitting: Student in an Organized Health Care Education/Training Program

## 2018-04-04 DIAGNOSIS — M1711 Unilateral primary osteoarthritis, right knee: Secondary | ICD-10-CM

## 2018-04-04 DIAGNOSIS — I5032 Chronic diastolic (congestive) heart failure: Secondary | ICD-10-CM

## 2018-04-04 NOTE — Telephone Encounter (Signed)
Yes, a rolling walker is appropriate for her given osteoarthritis and chronic leg edema.

## 2018-04-04 NOTE — Telephone Encounter (Signed)
Pt is requesting a  Rollator walker with Bilateral Platform attachments.  Per the patient, she has already checked with [facility] and they do  carry this type of walker.  Please advise if the patient is able to get this specific Walker.

## 2018-04-08 ENCOUNTER — Other Ambulatory Visit: Payer: Self-pay | Admitting: Student in an Organized Health Care Education/Training Program

## 2018-05-02 ENCOUNTER — Other Ambulatory Visit: Payer: Self-pay | Admitting: Student in an Organized Health Care Education/Training Program

## 2018-06-18 ENCOUNTER — Other Ambulatory Visit: Payer: Self-pay | Admitting: Student in an Organized Health Care Education/Training Program

## 2018-06-26 ENCOUNTER — Ambulatory Visit (INDEPENDENT_AMBULATORY_CARE_PROVIDER_SITE_OTHER): Payer: Medicare Other | Admitting: Student in an Organized Health Care Education/Training Program

## 2018-06-26 ENCOUNTER — Encounter: Payer: Self-pay | Admitting: Student in an Organized Health Care Education/Training Program

## 2018-06-26 VITALS — BP 149/81 | HR 70 | Wt 231.8 lb

## 2018-06-26 DIAGNOSIS — E785 Hyperlipidemia, unspecified: Secondary | ICD-10-CM

## 2018-06-26 DIAGNOSIS — M170 Bilateral primary osteoarthritis of knee: Secondary | ICD-10-CM

## 2018-06-26 DIAGNOSIS — M545 Low back pain: Secondary | ICD-10-CM

## 2018-06-26 DIAGNOSIS — R011 Cardiac murmur, unspecified: Secondary | ICD-10-CM

## 2018-06-26 DIAGNOSIS — Z79899 Other long term (current) drug therapy: Secondary | ICD-10-CM

## 2018-06-26 DIAGNOSIS — G8929 Other chronic pain: Secondary | ICD-10-CM | POA: Diagnosis not present

## 2018-06-26 DIAGNOSIS — E7800 Pure hypercholesterolemia, unspecified: Secondary | ICD-10-CM | POA: Diagnosis not present

## 2018-06-26 DIAGNOSIS — I10 Essential (primary) hypertension: Secondary | ICD-10-CM

## 2018-06-26 DIAGNOSIS — Z8673 Personal history of transient ischemic attack (TIA), and cerebral infarction without residual deficits: Secondary | ICD-10-CM

## 2018-06-26 MED ORDER — LISINOPRIL 20 MG PO TABS: 20.0000 mg | ORAL_TABLET | Freq: Every day | ORAL | 1 refills | Status: DC

## 2018-06-26 NOTE — Progress Notes (Signed)
   Assessment and Plan:  See Encounters tab for problem-based medical decision making.   __________________________________________________________  HPI:   78 year old woman here for follow-up of hypertension.  Blood pressure was above goal 3 months ago at her last visit.  We started lisinopril 10 mg daily.  Patient reports she is tolerating that medicine fine.  No lightheadedness.  No falls.  Reports good compliance, except she skipped this morning.  Takes her blood pressure at home with a electronic wrist cuff, but does not remember the numbers, did not bring the cuff.  Does not keep a log.  She complains of persistent pain and stiffness in her bilateral knees, left worse than the right.  Not function limiting.  Walks with the use of a rolling walker.  Uses a sleeve brace on the left knee which she says is helpful.  Denies any swelling.  Socially doing well.  Lives by herself.  Had nice holidays.  Has 9 great-grandchildren.  __________________________________________________________  Problem List: Patient Active Problem List   Diagnosis Date Noted  . Osteoporosis 04/03/2009    Priority: High  . Bilateral leg edema due to diastolic dysfunction 02/22/2007    Priority: High  . Chronic diastolic congestive heart failure (HCC) 09/28/2015    Priority: Medium  . Risk for falls 03/03/2015    Priority: Medium  . Anemia 05/01/2014    Priority: Medium  . Essential hypertension 09/14/2006    Priority: Medium  . Hyperlipidemia 04/27/2006    Priority: Medium  . Osteoarthritis 04/27/2006    Priority: Medium  . Lower back pain 04/25/2015    Priority: Low  . Healthcare maintenance 04/08/2014    Priority: Low  . Hyperglycemia 06/01/2012    Priority: Low  . Vitamin D deficiency 05/08/2010    Priority: Low    Medications: Reconciled today in Epic __________________________________________________________  Physical Exam:  Vital Signs: Vitals:   06/26/18 0836 06/26/18 0855  BP: (!)  163/78 (!) 149/81  Pulse: 80 70  SpO2: 99%   Weight: 231 lb 12.8 oz (105.1 kg)     Gen: Well appearing, NAD CV: RRR, 2 out of 6 early systolic murmur Lungs: Clear throughout with no wheezing Ext: Warm, no edema, bony overgrowth at both knees, no joint effusion appreciated. Neuro: Alert and oriented, conversational, wide-based slow gait with the use of a walker, normal strength in the upper and lower extremities.

## 2018-06-26 NOTE — Patient Instructions (Signed)
Today we talked about your knee and low back arthritis.  I have put a referral in for physical therapy.  Continue to use meloxicam on bad days.  We talked about your high blood pressure.  Your blood pressure is still above our goal.  We will increase your lisinopril from 10 mg a day to 20 mg a day.  I sent a new prescription to your pharmacy.  Please check your blood pressure a couple times a week, come back to the clinic in 3 months and bring your blood pressure cuff so we can review it together.  I will also check your cholesterol today.

## 2018-06-26 NOTE — Addendum Note (Signed)
Addended by: Erlinda Hong T on: 06/26/2018 09:03 AM   Modules accepted: Orders

## 2018-06-26 NOTE — Assessment & Plan Note (Signed)
Persistent moderate low back pain.  I again advised against the use of the back brace.  Referral to physical therapy to work on posture, gait training, core strengthening.  Continue with meloxicam as needed.

## 2018-06-26 NOTE — Assessment & Plan Note (Addendum)
Bilateral knee osteoarthritis.  Significant symptoms but not function limiting.  No significant effusions on exam, but does have bony overgrowth.  We will continue with supportive care, meloxicam as needed.  Patient does not find any benefit with tramadol.  I have ordered referral to home health physical therapy for strengthening and gait training.  Because of the arthritis the patient tells me it is a taxing effort for her to leave the house.  Patient is not interested in surgical consultation.

## 2018-06-26 NOTE — Assessment & Plan Note (Signed)
Blood pressure continues to be elevated above our goal, especially given her history of CVA in 2017.  Plan is to increase lisinopril from 10 mg a day up to 20 mg daily.  Will check BMP today.  Continue with atenolol 50 mg daily.

## 2018-06-26 NOTE — Assessment & Plan Note (Signed)
Hyperlipidemia with a history of CVA in 2017.  LDL above 100 at that time.  Plan is to recheck lipids today.  Continue with atorvastatin 80 mg daily.  Hopefully we can get the LDL closer to 70.

## 2018-06-27 ENCOUNTER — Encounter: Payer: Self-pay | Admitting: *Deleted

## 2018-06-27 ENCOUNTER — Encounter: Payer: Self-pay | Admitting: Student in an Organized Health Care Education/Training Program

## 2018-06-27 LAB — BMP8+ANION GAP
Anion Gap: 14 mmol/L (ref 10.0–18.0)
BUN/Creatinine Ratio: 18 (ref 12–28)
BUN: 12 mg/dL (ref 8–27)
CALCIUM: 9.9 mg/dL (ref 8.7–10.3)
CHLORIDE: 106 mmol/L (ref 96–106)
CO2: 23 mmol/L (ref 20–29)
CREATININE: 0.67 mg/dL (ref 0.57–1.00)
GFR calc Af Amer: 99 mL/min/{1.73_m2} (ref >59)
GFR calc non Af Amer: 86 mL/min/{1.73_m2} (ref >59)
GLUCOSE: 104 mg/dL — AB (ref 65–99)
Potassium: 4 mmol/L (ref 3.5–5.2)
Sodium: 143 mmol/L (ref 134–144)

## 2018-06-27 LAB — LIPID PANEL
CHOLESTEROL TOTAL: 163 mg/dL (ref 100–199)
Chol/HDL Ratio: 2.3 ratio (ref 0.0–4.4)
HDL: 70 mg/dL (ref >39)
LDL Calculated: 84 mg/dL (ref 0–99)
TRIGLYCERIDES: 45 mg/dL (ref 0–149)
VLDL CHOLESTEROL CAL: 9 mg/dL (ref 5–40)

## 2018-06-27 NOTE — Progress Notes (Signed)
HH order for PT along with demographic sheet and OV note from 06/26/2018 faxed to [facility] at [phone]. Kinnie Feil, RN, BSN

## 2018-06-29 NOTE — Progress Notes (Signed)
Received call from Henderson NewcomerMelissa Stenson at [facility]. She is unable to take patient 2/2 patient's insurance and lack of staffing in patient's area. [facility] try [facility]. Community message sent to [facility]ett [facility]. Kinnie FeilL. Jaymin Waln, RN, BSN

## 2018-07-04 NOTE — Progress Notes (Signed)
Cory with [facility] sent [employer] that he also is unable to take patient. Kinnie Feil, RN, BSN

## 2018-07-06 NOTE — Progress Notes (Signed)
Becky Sax, RN with [facility] will take patient for [facility] PT. Kinnie Feil, RN, BSN

## 2018-07-07 ENCOUNTER — Telehealth: Payer: Self-pay

## 2018-07-07 NOTE — Telephone Encounter (Signed)
Returned call to [place], PT with [facility]. Verbal auth given for [facility] PT twice weekly for 4 weeks to work on balance and strengthening. Will route to PCP for agreement/denial. Kinnie Feil, RN, BSN

## 2018-07-07 NOTE — Telephone Encounter (Signed)
[name] with [facility] requesting VO for PT. Please call back.

## 2018-07-07 NOTE — Telephone Encounter (Signed)
Agree  Thank you

## 2018-07-17 ENCOUNTER — Encounter: Payer: Self-pay | Admitting: *Deleted

## 2018-08-04 ENCOUNTER — Telehealth: Payer: Self-pay

## 2018-08-04 NOTE — Telephone Encounter (Signed)
VO 2x week for 4 weeks to continue to build strength, endurance and safety, HH PT. Do you agree?

## 2018-08-04 NOTE — Telephone Encounter (Signed)
Vernona Rieger with [facility] requesting VO for PT/skilled nursing. Please call pt back.

## 2018-08-06 ENCOUNTER — Other Ambulatory Visit: Payer: Self-pay | Admitting: Student in an Organized Health Care Education/Training Program

## 2018-08-07 NOTE — Telephone Encounter (Signed)
lisinopril (PRINIVIL,ZESTRIL) 20 MG tablet, refill request @  [id number] - [place], [place] - [address] [phone] (Phone) [phone] (Fax)

## 2018-08-07 NOTE — Telephone Encounter (Signed)
Agree  Thank you

## 2018-08-07 NOTE — Telephone Encounter (Signed)
Lisinopril #90 with 1 refill sent 06/26/2018. Confirmed with pharmacy they have this Rx and will get ready for patient. Kinnie Feil, RN, BSN

## 2018-08-08 ENCOUNTER — Other Ambulatory Visit: Payer: Self-pay | Admitting: Student in an Organized Health Care Education/Training Program

## 2018-08-08 NOTE — Telephone Encounter (Signed)
Received call from patient-states she contacted the pharmacy and her rx is not ready to pick up.  Upon further review of pt's chart and call to pharmacy-pt has lisinopril 20mg  daily ready for pick up at pharmacy. Called pt back to let he know rx was ready.   Pt c/o bilateral knee pain (worse on the left).  States  pcp had previously offered steroid injection, but  she declined at that time.  Pt is now ready to have injection-appt given for 2/24.Carla Spittle Cassady2/18/20203:38 PM

## 2018-08-11 ENCOUNTER — Telehealth: Payer: Self-pay | Admitting: *Deleted

## 2018-08-11 NOTE — Telephone Encounter (Signed)
Agree  Thank you

## 2018-08-11 NOTE — Telephone Encounter (Signed)
HH PT calls for VO for extension of HH PT 2x week for 3 weeks for balance, safety, gait and strengthening. Also for [facility] for disease and medication management. VO given, do you agree?

## 2018-08-14 ENCOUNTER — Ambulatory Visit (INDEPENDENT_AMBULATORY_CARE_PROVIDER_SITE_OTHER): Payer: Medicare Other | Admitting: Student in an Organized Health Care Education/Training Program

## 2018-08-14 ENCOUNTER — Encounter: Payer: Self-pay | Admitting: Student in an Organized Health Care Education/Training Program

## 2018-08-14 ENCOUNTER — Other Ambulatory Visit: Payer: Self-pay

## 2018-08-14 VITALS — BP 137/77 | HR 69 | Temp 98.1°F | Wt 229.8 lb

## 2018-08-14 DIAGNOSIS — E669 Obesity, unspecified: Secondary | ICD-10-CM | POA: Diagnosis not present

## 2018-08-14 DIAGNOSIS — Z6835 Body mass index (BMI) 35.0-35.9, adult: Secondary | ICD-10-CM

## 2018-08-14 DIAGNOSIS — M170 Bilateral primary osteoarthritis of knee: Secondary | ICD-10-CM | POA: Diagnosis not present

## 2018-08-14 DIAGNOSIS — I10 Essential (primary) hypertension: Secondary | ICD-10-CM

## 2018-08-14 DIAGNOSIS — G8929 Other chronic pain: Secondary | ICD-10-CM | POA: Diagnosis not present

## 2018-08-14 DIAGNOSIS — Z79899 Other long term (current) drug therapy: Secondary | ICD-10-CM

## 2018-08-14 NOTE — Assessment & Plan Note (Signed)
Much improved today.  We have been working over the last few months to get better blood pressure control.  Plan is to continue with regimen of lisinopril 20 mg and atenolol 50 mg once daily.  She uses furosemide 80 mg daily for chronic lower extremity edema as well.  Blood work was fine 1 month ago.  Follow-up in 3 months.

## 2018-08-14 NOTE — Patient Instructions (Signed)
Today we did a steroid injection into both of your knees.  There was a little more bleeding on the right side than usual, please call my office if you notice any swelling or discoloration of your skin or if you have return of bleeding.  Hopefully this will resolve, it had stopped with holding simple pressure on the skin.  Take it easy over the next 1-2 days.  Use meloxicam for pain in the knees.  I will call you tomorrow to check in.

## 2018-08-14 NOTE — Progress Notes (Signed)
   Assessment and Plan:  See Encounters tab for problem-based medical decision making.   __________________________________________________________  HPI:   78 year old woman here for follow-up of high blood pressure and an acute complaint of bilateral knee pain.  She has been living with chronic knee osteoarthritis related to obesity for many years.  Used to follow with [facility], not interested in knee replacement surgery.  Has used steroid injections in the past and found good benefit from them in both knees.  Says they improve her functional status for about 6 weeks.  Has not had any complications previously.  She reports over the last several weeks having increasing stiffness in both knees.  Says that improved throughout the day.  Left knee is now worse than right, which is unusual for her.  No fevers or chills.  No swelling or redness of the knees.  No recent traumas or falls.  Independent in all her activities of daily living.  She reports checking her blood pressure at home and says that it has been okay.  Reports good compliance with blood pressure medications.  Denies any adverse side effects.  __________________________________________________________  Problem List: Patient Active Problem List   Diagnosis Date Noted  . Osteoporosis 04/03/2009    Priority: High  . Bilateral leg edema due to diastolic dysfunction 02/22/2007    Priority: High  . Chronic diastolic congestive heart failure (HCC) 09/28/2015    Priority: Medium  . Risk for falls 03/03/2015    Priority: Medium  . Anemia 05/01/2014    Priority: Medium  . Essential hypertension 09/14/2006    Priority: Medium  . Hyperlipidemia 04/27/2006    Priority: Medium  . Osteoarthritis 04/27/2006    Priority: Medium  . Lower back pain 04/25/2015    Priority: Low  . Healthcare maintenance 04/08/2014    Priority: Low  . Hyperglycemia 06/01/2012    Priority: Low  . Vitamin D deficiency 05/08/2010    Priority:  Low    Medications: Reconciled today in Epic __________________________________________________________  Physical Exam:  Vital Signs: Vitals:   08/14/18 0837  BP: (!) 160/80  Pulse: 77  Temp: 98.1 F (36.7 C)  TempSrc: Oral  SpO2: 100%  Weight: 229 lb 12.8 oz (104.2 kg)    Gen: Well appearing, NAD MSK: Bilateral knees have bony overgrowth but no joint effusions.  She has moderate crepitus, no pain with passive range of motion. Extremities: Stable 2+ nonpitting edema bilateral lower extremities Neuro: Alert, oriented, conversational, slow wide based gait, uses a rolling walker, strength is normal in the upper and lower extremities

## 2018-08-14 NOTE — Assessment & Plan Note (Signed)
Patient with longstanding bilateral knee osteoarthritis.  Has had increasing stiffness, worsening pain over the last several weeks.  Becoming more function limiting.  Tolerated the injections in the past well, last injection was 1 year ago through [facility].  Uses NSAIDs.  Plan is to repeat steroid injections into bilateral knees.  There are minimal effusions so will not send sample for crystal analysis.   Knee Arthrocentesis with Injection Procedure Note  Diagnosis: bilateral knee osteoarthritis  Indications: Symptom relief from osteoarthritis  Anesthesia: not required  Procedure Details   Point of care ultrasound was used to identify small bilateral joint effusions and plan needle trajectory and depth. Consent was obtained for the procedure. The left knee was prepped with Betadine. A 21 gauge needle was inserted into the superior aspect of the joint from a medial approach to access the suprapatellar pouch.  Clear synovial fluid was encountered, then I injected 40 mg of triamcinolone and 2 mL's of 1% lidocaine through the same needle.  The needle was removed and the area cleansed and dressed.  I then turned my attention to the right knee.  I prepped the knee with Betadine.  Used topical cold spray for anesthesia.  A 21-gauge needle was inserted superior lateral to enter the suprapatellar bursa.  Small amount of clear synovial fluid was encountered.  I injected 40 mg of triamcinolone and 2 mL of 1% lidocaine through the same needle.  The needle was removed.  There was a small amount of bleeding which resolved after holding pressure for 2 minutes.  The area was cleaned and dressed.  Complications: Small amount of bleeding on the right knee, a little more than usual, but resolved with holding pressure.  May have small risk for soft tissue hematoma, gave her return precautions if she notices swelling or discoloration of the skin.

## 2018-08-15 ENCOUNTER — Telehealth: Payer: Self-pay | Admitting: Student in an Organized Health Care Education/Training Program

## 2018-08-15 NOTE — Telephone Encounter (Signed)
Spoke on the phone to follow up on bilateral steroid injections to her knees. She reports doing well, knee pain is improved, good functional status today. No bleeding. Will follow up in 3 months and see how she is doing.

## 2018-09-09 ENCOUNTER — Other Ambulatory Visit: Payer: Self-pay | Admitting: Student in an Organized Health Care Education/Training Program

## 2018-09-25 ENCOUNTER — Other Ambulatory Visit: Payer: Self-pay | Admitting: Student in an Organized Health Care Education/Training Program

## 2018-10-10 ENCOUNTER — Other Ambulatory Visit: Payer: Self-pay

## 2018-10-10 ENCOUNTER — Telehealth: Payer: Self-pay

## 2018-10-10 ENCOUNTER — Ambulatory Visit (INDEPENDENT_AMBULATORY_CARE_PROVIDER_SITE_OTHER): Payer: Medicare Other | Admitting: Internal Medicine

## 2018-10-10 DIAGNOSIS — R600 Localized edema: Secondary | ICD-10-CM | POA: Diagnosis not present

## 2018-10-10 NOTE — Telephone Encounter (Signed)
Requesting to speak with a nurse regarding left lower leg problem. Please call pt back.

## 2018-10-10 NOTE — Progress Notes (Signed)
   CC: LE swelling  This is a telephone encounter between [name] and [facility] on 10/10/2018 for LE swelling. The visit was conducted with the patient located at home and [facility] at [facility]. The patient's identity was confirmed using their DOB and current address. The patient has consented to being evaluated through a telephone encounter and understands the associated risks (an examination cannot be done and the patient may need to come in for an appointment) / benefits (allows the patient to remain at home, decreasing exposure to coronavirus). I personally spent 11 minutes on medical discussion.   HPI:  [name] is a 78 y.o. with PMH as below.   Please see A&P for assessment of the patient's acute and chronic medical conditions.   Past Medical History:  Diagnosis Date  . Abnormal electromyogram (EMG)    Low grade right S1 radiculopathy 7/93.  . Diastolic dysfunction    a. 08/2012 Echo: EF 60-65%, Gr 2 DD, triv MR, PASP .  [name] Distal radius fracture, right 12/22/2015  . DJD (degenerative joint disease)   . Elevated alkaline phosphatase level    Bone scan 08/2004 showed no evidence of abnormal bony activity..  . Herniated lumbar intervertebral disc    S/P L4-5 & L5-S1 laminotomy, foraminotomy, and L5-S1 diskectomy by Dr. Trey Sailors 11/01/2000.  [name] Hyperlipemia   . Hypertension   . Iron deficiency anemia   . Osteopenia    A DEXA bone density scan done 02/13/2009 showed a left femur neck young adult T-score of -1.6 (osteopenia), a right femur neck young adult T-score of -1.6 (osteopenia), and an AP spine young adult T-score of 1.3 (normal).  Her FRAX score gave an estimated 10 year probability of 6.6% for major osteoporotic fracture and 0.8% for hip fracture.  . Peripheral neuropathy   . Rotator cuff syndrome of right shoulder    S/P right shoulder arthroscopic debridement of a massive rotator cuff tear, greater tuberosity and resection of subacromial spur by Dr. Gean Birchwood 07/07/2005.  . Stroke (HCC) 04/2013  . SVT (supraventricular tachycardia) (HCC)    PSVT  . Vitamin D deficiency    Review of Systems:  Performed and all others negative.  Assessment & Plan:   See Encounters Tab for problem based charting.  Patient discussed with Dr. Cyndie Chime

## 2018-10-10 NOTE — Telephone Encounter (Signed)
Pt states her legs have been having fluid to ooze from them, she states she went a few days without taking her "fluid pill", states her legs were swollen and tight.  She also states one of her toes that the podiatrist cut the toenail on looks like it has fluid in it. Denies chest pain, short of breath, h/a, n&v, increasing girth, weakness. States she feels good except she is concerned about the fluid, last came out was 4/20 Please advise

## 2018-10-10 NOTE — Telephone Encounter (Signed)
Chart reviewed. On lasix 80 mg daily for leg swelling noted since 2008 presumably related to grade 1 diastolic dysfunction. This problem not addressed in recent clinic notes. Legs tight, blistering, oozing fluid, otherwise asymptomatic. I am reluctant to increase lasix without further eval. Please schedule telehealth visit and we will go from there. May need in person visit. DrG

## 2018-10-10 NOTE — Progress Notes (Signed)
Medicine attending: Medical history, presenting problems,  and medications, reviewed with resident physician Dr Levora Dredge on the day of the patient telephone consultation and I concur with his evaluation and management plan. Chart reviewed in advance. Chronic leg edema at least since 2008, on lasix 80 mg QD, legs now blistering/oozing fluid. Grad 1 diastolic dysfunction related to HTN. Pt admits to Dr Rexene Edison missed lasix x 4 days. Advised to resume, increase to 120 mg x 2 d then resume at 80 QD.

## 2018-10-10 NOTE — Telephone Encounter (Signed)
Returned call to patient. Scheduled [facility] appt for telehealth visit today. Kinnie Feil, RN, BSN

## 2018-10-10 NOTE — Assessment & Plan Note (Signed)
HPI:  Patient called with concerns of worsening LE edema. Missed her furosemide for 4 days late last week. On Sunday noted increased swelling and weeping. She put on her compression stockings and restarted her lasix. Since that time the swelling has improved but is still increased compared to baseline. She denies SHOB, exertional dyspnea, worsening orthopnea, N/V, early satiety, increasing abdominal girth, changes in urination. Review of records indicates that she has 2+ pitting edema bilaterally at baseline. BMP with stable renal functions. She is on K supplementation in addition to lisinopril. Limits her fluid and salt intake.   A/P: - Will increase lasix to 80 mg 6AM and 40 mg at noon for the next 2 days.  - Patient will call if swelling does not improve  - No indications for admission to hospital

## 2018-11-14 ENCOUNTER — Other Ambulatory Visit: Payer: Self-pay | Admitting: Student in an Organized Health Care Education/Training Program

## 2018-11-14 NOTE — Telephone Encounter (Signed)
Refill Request   furosemide (LASIX) 40 MG tablet  [id number] - [place], [place] - [address]

## 2018-11-20 ENCOUNTER — Ambulatory Visit (INDEPENDENT_AMBULATORY_CARE_PROVIDER_SITE_OTHER): Payer: Medicare Other | Admitting: Student in an Organized Health Care Education/Training Program

## 2018-11-20 ENCOUNTER — Encounter: Payer: Self-pay | Admitting: Student in an Organized Health Care Education/Training Program

## 2018-11-20 ENCOUNTER — Other Ambulatory Visit: Payer: Self-pay

## 2018-11-20 VITALS — BP 135/76 | HR 70 | Temp 97.8°F | Ht 67.0 in | Wt 220.4 lb

## 2018-11-20 DIAGNOSIS — I998 Other disorder of circulatory system: Secondary | ICD-10-CM

## 2018-11-20 DIAGNOSIS — M810 Age-related osteoporosis without current pathological fracture: Secondary | ICD-10-CM

## 2018-11-20 DIAGNOSIS — M7989 Other specified soft tissue disorders: Secondary | ICD-10-CM

## 2018-11-20 DIAGNOSIS — R600 Localized edema: Secondary | ICD-10-CM

## 2018-11-20 DIAGNOSIS — I5089 Other heart failure: Secondary | ICD-10-CM

## 2018-11-20 DIAGNOSIS — Z79899 Other long term (current) drug therapy: Secondary | ICD-10-CM

## 2018-11-20 DIAGNOSIS — M170 Bilateral primary osteoarthritis of knee: Secondary | ICD-10-CM

## 2018-11-20 DIAGNOSIS — M1712 Unilateral primary osteoarthritis, left knee: Secondary | ICD-10-CM | POA: Diagnosis not present

## 2018-11-20 DIAGNOSIS — Z7902 Long term (current) use of antithrombotics/antiplatelets: Secondary | ICD-10-CM

## 2018-11-20 MED ORDER — FUROSEMIDE 40 MG PO TABS: 80.0000 mg | ORAL_TABLET | Freq: Every day | ORAL | 1 refills | Status: DC

## 2018-11-20 MED ORDER — ALENDRONATE SODIUM 70 MG PO TABS: ORAL_TABLET | ORAL | 3 refills | Status: DC

## 2018-11-20 MED ORDER — MELOXICAM 15 MG PO TABS: 15.0000 mg | ORAL_TABLET | Freq: Every day | ORAL | 2 refills | Status: DC | PRN

## 2018-11-20 NOTE — Assessment & Plan Note (Signed)
1+ pitting edema bilaterally today, stable to improved.  Had some worsening edema with skin breakdown in April but this is now much improved.  No wounds today.  I think this is a combination of chronic heart failure with preserved ejection fraction as well as obesity related to vascular insufficiency.  Plan to continue with Lasix 80 mg in the morning, 40 mg in the early afternoon.  We talked about compression stockings to be worn every day, take them off at night.  Advised increased walking and exercise.  Advised to call us early if she has any wounds in the future, she tolerates Lasix well may get to the point where she needs medicated compression wrappings like unna boots.

## 2018-11-20 NOTE — Assessment & Plan Note (Signed)
Fragility fracture in 2017.  She has forgotten to take the alendronate for some period of time, probably months.  We discussed the diagnosis and the treatment.  Plan is to restart alendronate, I sent a new prescription to her pharmacy.  Would plan to treat through 2022.

## 2018-11-20 NOTE — Progress Notes (Signed)
   Assessment and Plan:  See Encounters tab for problem-based medical decision making.   __________________________________________________________  HPI:   78 year old woman here for follow-up of knee osteoarthritis and chronic lower extremity swelling.  Doing fairly well at home, has been isolated over the last 3 months because of COVID-19 restrictions.  Lives with her daughter who is a Research scientist (physical sciences).  Had no symptoms suggestive of COVID-19 and they are careful to reduce possible exposures.  Independent in her activities of daily living.  Had an exacerbation of her lower extremity swelling in April with some wounds of the leg.  Good improvement with increased Lasix dosing.  Wounds have healed nicely.  Exercise has fallen off, stays in the house mostly.  Knee pain has been stable to improved, walks with a cane.  She did a course of physical therapy at home which she said was very helpful.  Denies any chest pain or shortness of breath.  No fevers or chills.  __________________________________________________________  Problem List: Patient Active Problem List   Diagnosis Date Noted  . Osteoporosis 04/03/2009    Priority: High  . Bilateral leg edema due to diastolic dysfunction 02/22/2007    Priority: High  . Ischemic vascular disease 11/20/2018    Priority: Medium  . Chronic diastolic congestive heart failure (HCC) 09/28/2015    Priority: Medium  . Risk for falls 03/03/2015    Priority: Medium  . Anemia 05/01/2014    Priority: Medium  . Essential hypertension 09/14/2006    Priority: Medium  . Hyperlipidemia 04/27/2006    Priority: Medium  . Osteoarthritis 04/27/2006    Priority: Medium  . Lower back pain 04/25/2015    Priority: Low  . Healthcare maintenance 04/08/2014    Priority: Low  . Hyperglycemia 06/01/2012    Priority: Low  . Vitamin D deficiency 05/08/2010    Priority: Low    Medications: Reconciled today in Epic  __________________________________________________________  Physical Exam:  Vital Signs: Vitals:   11/20/18 1058  BP: 135/76  Pulse: 70  Temp: 97.8 F (36.6 C)  TempSrc: Oral  SpO2: 100%  Weight: 220 lb 6.4 oz (100 kg)    Gen: Well appearing, NAD Neck: No cervical LAD, No thyromegaly or nodules, No JVD. CV: RRR, no murmurs Pulm: Normal effort, CTA throughout, no wheezing Ext: Warm, 1+ bilateral pitting edema Skin: No atypical appearing moles. No rashes, no chronic stasis changes of her lower extremities

## 2018-11-20 NOTE — Assessment & Plan Note (Signed)
Bilateral knee osteoarthritis with recurrent effusions is doing well.  Last did a steroid injection to both knees in February 2020.  Functionally doing well so far.  Plan to continue with meloxicam as needed for knee pain.  If stiffness worsens or becomes function limiting we can offer steroid injection again.

## 2018-11-20 NOTE — Assessment & Plan Note (Signed)
Prior TIA recorded around 2014.  Plan to continue with Plavix 75 mg daily and atorvastatin 80 mg daily.  No side effects so far.

## 2018-11-20 NOTE — Patient Instructions (Signed)
It was great seeing you today.  We talked about the swelling in your legs, continue to take your Lasix as you are doing.  Use compression stockings every day, take them off at night.  Try to increase your walking and exercise.  I am glad your knee pain is improved, call me if it worsens.  I prescribed meloxicam which you can use daily as needed.  We also talked about osteoporosis and I refilled the Fosamax which will help with your osteoporosis over the next few years.

## 2018-12-05 ENCOUNTER — Other Ambulatory Visit: Payer: Self-pay | Admitting: Student in an Organized Health Care Education/Training Program

## 2019-01-19 ENCOUNTER — Telehealth: Payer: Self-pay

## 2019-01-19 NOTE — Telephone Encounter (Signed)
Pt called and sch an appt for 10:15am with Dr. Evette Doffing

## 2019-01-19 NOTE — Telephone Encounter (Signed)
I strongly recommend against the use of a back brace. They are almost never medically necessary.

## 2019-01-19 NOTE — Telephone Encounter (Signed)
Call placed to patient to schedule appt with PCP to discuss need for back brace. This is necessary for insurance purposes. This can be done via telehealth 2/2 Covid-19. Detailed message left on patient's self-identified VM requesting return call to schedule. PCP has 2 opening on 01/22/2019. Hubbard Hartshorn, RN, BSN

## 2019-01-19 NOTE — Telephone Encounter (Signed)
Pt would like back brace to be order. Please call pt back.

## 2019-01-22 ENCOUNTER — Encounter: Payer: Self-pay | Admitting: Student in an Organized Health Care Education/Training Program

## 2019-01-22 ENCOUNTER — Ambulatory Visit (INDEPENDENT_AMBULATORY_CARE_PROVIDER_SITE_OTHER): Payer: Medicare Other | Admitting: Student in an Organized Health Care Education/Training Program

## 2019-01-22 DIAGNOSIS — R2681 Unsteadiness on feet: Secondary | ICD-10-CM | POA: Diagnosis not present

## 2019-01-22 DIAGNOSIS — G8929 Other chronic pain: Secondary | ICD-10-CM

## 2019-01-22 DIAGNOSIS — E669 Obesity, unspecified: Secondary | ICD-10-CM

## 2019-01-22 DIAGNOSIS — M545 Low back pain, unspecified: Secondary | ICD-10-CM

## 2019-01-22 DIAGNOSIS — M170 Bilateral primary osteoarthritis of knee: Secondary | ICD-10-CM | POA: Diagnosis not present

## 2019-01-22 DIAGNOSIS — Z9181 History of falling: Secondary | ICD-10-CM

## 2019-01-22 DIAGNOSIS — Z87310 Personal history of (healed) osteoporosis fracture: Secondary | ICD-10-CM

## 2019-01-22 NOTE — Assessment & Plan Note (Signed)
Patient with chronic low back pain due to osteoarthritis related to obesity.  Sounds like she is having some increasing weakness due to a flare of this low back pain.  I spent a significant amount of time counseling her on the risk of back braces.  We talked about the lack of evidence of back braces.  We talked about the risk of worsening her functional status, causing more stiffness, or even leading to falls.  The patient really strongly felt that she wanted to try back brace.  We talked about the problem with these natural remedies, and with predatory DME suppliers and natural remedy clinics.  However I really have not much better to offer her.  We settled on a compromise where she would except in home physical therapy to work on strengthening, gait training, and best practices for using this kind of assistive device.  I will order the back brace warned her about the long-term risks.  Were going to keep in touch and see how she does with it.

## 2019-01-22 NOTE — Assessment & Plan Note (Signed)
Patient with a high risk for falls due to osteoarthritis and obesity.  She has sleep difficulty leaving home as a result of unsteadiness.  Has had falls in the past with fragility fracture.  Has found benefit with physical therapy before.  She is a little uncomfortable with physical therapy right now because of COVID-19 restrictions.  However current low back pain and we this are starting to inhibit her activities of daily living.  She is highly value staying independent and living at home alone.  Plan is to try another course of at home physical therapy to try to improve gait training, improve stabilizing muscles, and central core strengthening, improve home environment, and teach her how to best to use the back brace assistive device.

## 2019-01-22 NOTE — Telephone Encounter (Signed)
Happy to discuss if she would like. May be best as a telephone visit if ok with her.

## 2019-01-22 NOTE — Progress Notes (Signed)
[facility] Telephone Encounter Continuity Care Appointment  HPI:   This telephone encounter was created for [name] on 01/22/2019 for the following purpose/cc lower back pain.  78 year old person with chronic low back pain and bilateral knee osteoarthritis comes in today requesting a back brace order.  Patient tells me that over the last several weeks she has been struggling with steadiness and low back pain.  She says she feels like she is unable to hold her posture upright for long present time.  She tells me this is most bothersome when she is doing chores at home or cooking in the kitchen.  She feels weak, slumped over, and then develops low back pain.  She says she thinks her spine is not straight enough.  Symptoms are improved with sitting down.  She denies weakness in her lower extremities.  Denies any fevers or chills.  This is been gradual in onset.  No recent falls, though she does report feeling some unsteadiness.  She [place] the [facility] [place].  She says that she did this because of running of hers was going there.  She did not see a physician.  She says that she was measured, told that her spine was not straight, and told that she would benefit from a back brace.  They did not order this for her, and instead told her to call me to complete this work-up.  He did not provide physical therapy for her.  I looked at their website with her, this is a center that offers dubious natural treatments including platelet rich plasma injections and stem cell therapy.    Past Medical History:  Past Medical History:  Diagnosis Date  . Abnormal electromyogram (EMG)    Low grade right S1 radiculopathy 7/93.  . Diastolic dysfunction    a. 08/2012 Echo: EF 60-65%, Gr 2 DD, triv MR, PASP 44mmHg.  [name]. Distal radius fracture, right 12/22/2015  . DJD (degenerative joint disease)   . Elevated alkaline phosphatase  level    Bone scan 08/2004 showed no evidence of abnormal bony activity..  . Herniated lumbar intervertebral disc    S/P L4-5 & L5-S1 laminotomy, foraminotomy, and L5-S1 diskectomy by Dr. [name] 11/01/2000.  [name]. Hyperlipemia   . Hypertension   . Iron deficiency anemia   . Osteopenia    A DEXA bone density scan done 02/13/2009 showed a left femur neck young adult T-score of -1.6 (osteopenia), a right femur neck young adult T-score of -1.6 (osteopenia), and an AP spine young adult T-score of 1.3 (normal).  Her FRAX score gave an estimated 10 year probability of 6.6% for major osteoporotic fracture and 0.8% for hip fracture.  . Peripheral neuropathy   . Rotator cuff syndrome of right shoulder    S/P right shoulder arthroscopic debridement of a massive rotator cuff tear, greater tuberosity and resection of subacromial spur by Dr. [name] 07/07/2005.  . Stroke (HCC) 04/2013  . SVT (supraventricular tachycardia) (HCC)    PSVT  . Vitamin D deficiency       ROS:      Assessment / Plan / Recommendations:   Please see A&P under problem oriented charting for assessment of the patient's acute and chronic medical conditions.   As always, pt is advised that if symptoms worsen or new symptoms arise, they should go to an urgent care facility or to to ER for further evaluation.   Consent and  Medical Decision Making:   This is a telephone encounter between [name] and [employer] on 01/22/2019 for low back pain and unsteadiness. The visit was conducted with the patient located at home and [name] at [facility]. The patient's identity was confirmed using their DOB and current address. The patient has consented to being evaluated through a telephone encounter and understands the associated risks (an examination cannot be done and the patient may need to come in for an appointment) / benefits (allows the patient to remain at home, decreasing exposure to coronavirus). I personally  spent 19 minutes on medical discussion.

## 2019-01-23 ENCOUNTER — Telehealth: Payer: Self-pay | Admitting: *Deleted

## 2019-01-23 ENCOUNTER — Other Ambulatory Visit: Payer: Self-pay | Admitting: Student in an Organized Health Care Education/Training Program

## 2019-01-23 NOTE — Telephone Encounter (Signed)
Order for back brace along with demographics, OV note from 01/22/2019, as well as insurance card faxed to the [facility] at [phone]. Hubbard Hartshorn, RN, BSN

## 2019-01-23 NOTE — Telephone Encounter (Signed)
Patient had [name] with [facility] in January 2020. Call placed to Sharmon Revere with [facility] for new referral to [facility] Cook. Malachy Mood will call her management to see if they are able to resume care. Hubbard Hartshorn, RN, BSN

## 2019-01-23 NOTE — Telephone Encounter (Signed)
Malachy Mood returned call. Unfortunately, patient was d/c from [facility] PT with [facility] in March 2020. They are not able to resume care at this time. Hubbard Hartshorn, RN, BSN

## 2019-01-23 NOTE — Telephone Encounter (Signed)
Thank you :)

## 2019-01-29 NOTE — Telephone Encounter (Signed)
I have faxed information to [facility] on Friday,I as waiting for response [place], [place] C8/10/20208:31 AM

## 2019-01-31 ENCOUNTER — Other Ambulatory Visit: Payer: Self-pay | Admitting: Student in an Organized Health Care Education/Training Program

## 2019-02-02 NOTE — Telephone Encounter (Signed)
Phone call to [facility], they are behind and not able to take patient at this time,will try another agency [place], [place] C8/14/202010:18 AM

## 2019-02-06 NOTE — Telephone Encounter (Signed)
Contacted by [facility] they can not take this patient,will continue to look for other agencies [place], [place] C8/18/20209:39 AM

## 2019-02-12 NOTE — Telephone Encounter (Signed)
[facility] not take patient, will try [facility] [place], [place] C8/24/202012:20 PM

## 2019-02-20 ENCOUNTER — Other Ambulatory Visit: Payer: Self-pay | Admitting: Student in an Organized Health Care Education/Training Program

## 2019-02-20 NOTE — Telephone Encounter (Signed)
No response back from [facility]  I have sent a message to [place], [place] C9/1/20209:14 AM

## 2019-02-22 ENCOUNTER — Telehealth: Payer: Self-pay | Admitting: Student in an Organized Health Care Education/Training Program

## 2019-02-22 NOTE — Telephone Encounter (Signed)
Pt is wanting physician to call regarding the exercise with [facility]; she doesn't think she needs that [phone]

## 2019-02-22 NOTE — Telephone Encounter (Signed)
Following message received from [place], [place] with [facility]:  Clayton Lefort,  We will take care of her. I see several agencies declined :( Please keep [place] in mind as needs arise, I'm happy to help. Thank you!

## 2019-02-22 NOTE — Telephone Encounter (Signed)
Called pt, she stated all she wanted was a special walker that holds her up and gives her support, her daughter saw a man at the imaging ctr using one and thought they would get one on [employer] if [facility] could not help. Explained to pt that [facility] PT could help her choose the correct equipment and make sure it was adjusted as not to cause her to have pain or an accident and should be able to advise her on safety issues in her home as far as ambulation in the home goes. Explained she could stop visits whenever she felt she is not benefiting from visits. She is agreeable with this thought and states she had not thought about that side of the issue. States she is going to call [facility] back and tell them she wants [facility]. Just FYI

## 2019-02-27 ENCOUNTER — Telehealth: Payer: Self-pay | Admitting: Student in an Organized Health Care Education/Training Program

## 2019-02-27 NOTE — Telephone Encounter (Signed)
[name] from [facility] at [facility] [phone] needs VO

## 2019-02-28 ENCOUNTER — Telehealth: Payer: Self-pay | Admitting: Student in an Organized Health Care Education/Training Program

## 2019-02-28 NOTE — Telephone Encounter (Signed)
Rtc, no answer 

## 2019-02-28 NOTE — Telephone Encounter (Signed)
Returned call to [place], [place]. Verbal auth given for 1 week 1, and 2 week 6 to work on LE strengthening, balance, gait training, and home exercise program. Will route to PCP for agreement/denial. Hubbard Hartshorn, RN, BSN

## 2019-02-28 NOTE — Telephone Encounter (Signed)
Agree  Thank you

## 2019-02-28 NOTE — Telephone Encounter (Signed)
[name] [phone] needs VO

## 2019-03-23 ENCOUNTER — Other Ambulatory Visit: Payer: Self-pay | Admitting: *Deleted

## 2019-03-25 MED ORDER — ATENOLOL 50 MG PO TABS: 50.0000 mg | ORAL_TABLET | Freq: Every day | ORAL | 1 refills | Status: DC

## 2019-04-21 ENCOUNTER — Other Ambulatory Visit: Payer: Self-pay | Admitting: Student in an Organized Health Care Education/Training Program

## 2019-04-30 ENCOUNTER — Other Ambulatory Visit: Payer: Self-pay | Admitting: Student in an Organized Health Care Education/Training Program

## 2019-06-25 ENCOUNTER — Other Ambulatory Visit: Payer: Self-pay | Admitting: Student in an Organized Health Care Education/Training Program

## 2019-07-18 ENCOUNTER — Other Ambulatory Visit: Payer: Self-pay | Admitting: Student in an Organized Health Care Education/Training Program

## 2019-08-20 ENCOUNTER — Other Ambulatory Visit: Payer: Self-pay | Admitting: Student in an Organized Health Care Education/Training Program

## 2019-08-29 ENCOUNTER — Telehealth: Payer: Self-pay

## 2019-08-29 NOTE — Telephone Encounter (Signed)
RTC to patient, she is asking if she should get the Covid vaccine.   Pt received high dose fluzone in 2019, reaction is listed as "oculorespiratory syndrome".  Pt states after receiving this vaccine, her eyes became very red and she had a h/a X 24hours.  She denies any respiratory symptoms from this vaccine. Pt also allergic to PCN - local reaction at site; Sulfa drugs listed with SOB reaction which pt confirmed.  Will forward to PCP to advise on Covid vaccine. Thank you, SChaplin, RN,BSN

## 2019-08-29 NOTE — Telephone Encounter (Signed)
Requesting to speak with a nurse about covid vaccine. Please call pt back.

## 2019-08-29 NOTE — Telephone Encounter (Signed)
Yes, I would recommend the covid vaccine. The oculoreapiratory side effect is specific to flu vaccine and I do not think has occurred with the new covid vaccines. In my judgement, benefit of the vaccine outweighs the risk for her. Thanks.

## 2019-08-30 NOTE — Telephone Encounter (Signed)
RTC to patient and she was informed of Dr. Jacqualin Combes instruction regarding recommendation for Covid vaccine, she verbalized understanding.  Pt also states she has had some intermittent, left hand numbness/tingling X 4 months.  She bought a glove from [employer] which she wears intermittently and states when she wears the glove, she does not have any numbness and/or tingling to her hand.  She is requesting an appt to be seen for evaluation.  Appt made in [facility] on 09/10/19 @ 1015 per pt request and she was instructed to bring the glove she wears to her appt. SChaplin, RN,BSN

## 2019-08-30 NOTE — Telephone Encounter (Signed)
Ok. Thank you. We can do an evaluation for neuropathy at that visit.

## 2019-09-10 ENCOUNTER — Encounter: Payer: Self-pay | Admitting: Internal Medicine

## 2019-09-10 ENCOUNTER — Ambulatory Visit (INDEPENDENT_AMBULATORY_CARE_PROVIDER_SITE_OTHER): Payer: Medicare Other | Admitting: Internal Medicine

## 2019-09-10 VITALS — BP 122/69 | HR 71 | Temp 98.4°F | Wt 216.7 lb

## 2019-09-10 DIAGNOSIS — I10 Essential (primary) hypertension: Secondary | ICD-10-CM | POA: Diagnosis not present

## 2019-09-10 DIAGNOSIS — Z79899 Other long term (current) drug therapy: Secondary | ICD-10-CM | POA: Diagnosis not present

## 2019-09-10 DIAGNOSIS — Z8673 Personal history of transient ischemic attack (TIA), and cerebral infarction without residual deficits: Secondary | ICD-10-CM | POA: Diagnosis not present

## 2019-09-10 DIAGNOSIS — G609 Hereditary and idiopathic neuropathy, unspecified: Secondary | ICD-10-CM

## 2019-09-10 DIAGNOSIS — G587 Mononeuritis multiplex: Secondary | ICD-10-CM

## 2019-09-10 NOTE — Progress Notes (Signed)
   CC: Left hand numbness   HPI:[name] is a 79 y.o. female who presents for evaluation of left hand numbness. Please see individual problem based A/P for details.  Depression, PHQ-9: Based on the patients    Office Visit from 09/10/2019 in [facility]  PHQ-9 Total Score  2      Past Medical History:  Diagnosis Date  . Abnormal electromyogram (EMG)    Low grade right S1 radiculopathy 7/93.  . Diastolic dysfunction    a. 08/2012 Echo: EF 60-65%, Gr 2 DD, triv MR, PASP .  [name] Distal radius fracture, right 12/22/2015  . DJD (degenerative joint disease)   . Elevated alkaline phosphatase level    Bone scan 08/2004 showed no evidence of abnormal bony activity..  . Herniated lumbar intervertebral disc    S/P L4-5 & L5-S1 laminotomy, foraminotomy, and L5-S1 diskectomy by Dr. Trey Sailors 11/01/2000.  [name] Hyperlipemia   . Hypertension   . Iron deficiency anemia   . Osteopenia    A DEXA bone density scan done 02/13/2009 showed a left femur neck young adult T-score of -1.6 (osteopenia), a right femur neck young adult T-score of -1.6 (osteopenia), and an AP spine young adult T-score of 1.3 (normal).  Her FRAX score gave an estimated 10 year probability of 6.6% for major osteoporotic fracture and 0.8% for hip fracture.  . Peripheral neuropathy   . Rotator cuff syndrome of right shoulder    S/P right shoulder arthroscopic debridement of a massive rotator cuff tear, greater tuberosity and resection of subacromial spur by Dr. Gean Birchwood 07/07/2005.  . Stroke (HCC) 04/2013  . SVT (supraventricular tachycardia) (HCC)    PSVT  . Vitamin D deficiency    Review of Systems:  ROS negative except as per HPI.  Physical Exam: Vitals:   09/10/19 1005  BP: 122/69  Pulse: 71  Temp: 98.4 F (36.9 C)  TempSrc: Oral  SpO2: 99%    General: Alert, nl appearance HE: Normocephalic, atraumatic , EOMI, Conjunctivae normal ENT: No congestion, no rhinorrhea moist, no exudate or  erythema  Cardiovascular: Normal rate, regular rhythm.  No murmurs, rubs, or gallops Pulmonary : Effort normal, breath sounds normal. No wheezes, rales, or rhonchi Musculoskeletal: negative Phalen and tinel, no deformity or edema of wrist or hand Skin: Warm, dry , no bruising, erythema, or rash Neuro: Mental Status: Patient is awake, alert, oriented x3 No signs of aphasia or neglect Cranial Nerves: II: Pupils equal, round, and reactive to light.   III,IV, VI: EOMI without ptosis or diploplia.  V: Facial sensation is asymmetric to light touch , left perioral numbness  VII: Facial movement is symmetric.  VIII: hearing is intact to voice X: Uvula elevates symmetrically XI: Shoulder shrug is symmetric. XII: tongue is midline without atrophy or fasciculations.  Motor:  Goo effort thorughout, at Least 5/5 bilateral UE Sensory: Sensation is grossly intact in bilateral UEs Deep Tendon Reflexes: biceps and brachioradialis 2+ Psychiatric/Behavioral:  normal mood, normal behavior   Assessment & Plan:   See Encounters Tab for problem based charting.  Patient discussed with Dr. Sandre Kitty

## 2019-09-10 NOTE — Assessment & Plan Note (Addendum)
Hypertension: Patient's BP today is 122/69. The patient endorses adherence to current medication regimen. She denied, chest pain, headache, visual changes, lightheadedness, weakness, and dizziness.   Plan: Continue lisinopril 20 mg once daily  Continue atenolol 50 mg once daily

## 2019-09-10 NOTE — Patient Instructions (Signed)
Thank you for trusting me with your care. To recap, today we discussed the following:   Left wrist numbness My recommendations are to take the Meloxicam prescribed for one week and continue using your gloves.   My best,  Thurmon Fair

## 2019-09-10 NOTE — Assessment & Plan Note (Signed)
  Left hand numbness: Patient presents with left hand numbness which began 4 months ago.  Patient also associates some periorbital numbness on the left side of her mouth. She has a history significant for osteoporosis, but no focal tenderness over spine, pain in hand, or radiation of pain. She denies any falls or trauma to this area. Patient has a history of right parietal lobe stroke in 2017 and thalamic&basal ganglia strokes in 2014.  Her symptoms are on the same side, left perioral numbness and left hand numbness. Classically a medullary stroke would present with ipsilateral facial numbness and contralateral extremity.  The numbness sometimes goes away during the day which doesn't fit with a stroke diagnosis.  She is also found relief with meloxicam and gloves which support her wrist.  On exam I am not able to reproduce any pain or radiation. No focal weakness. She has numbness throughout her hand, and does not have any nerve distribution.  No appreciated facial droop or weakness. Cranial nerves II-XII intact. Assessment:   Mononeuropathy multiplex Plan:  - Patient has shown improvement with current therapy, numbness goes completely away sometimes during the day. So asked patient to take her meloxicam daily for one week and continued to using supportive glove. If she continues to have numbness would consider more expanded differential and possible consider referral to neurology given history of strokes.

## 2019-09-11 NOTE — Progress Notes (Signed)
Internal Medicine Clinic Attending  Case discussed with Dr. Steen at the time of the visit.  We reviewed the resident's history and exam and pertinent patient test results.  I agree with the assessment, diagnosis, and plan of care documented in the resident's note.  Henderson Frampton, M.D., Ph.D.  

## 2019-10-01 ENCOUNTER — Ambulatory Visit: Payer: Medicare Other

## 2019-10-04 ENCOUNTER — Ambulatory Visit: Payer: Medicare Other | Attending: Internal Medicine

## 2019-10-04 DIAGNOSIS — Z23 Encounter for immunization: Secondary | ICD-10-CM

## 2019-10-04 NOTE — Progress Notes (Signed)
   Covid-19 Vaccination Clinic  Name:  LATOYIA TECSON    MRN: [id number] DOB: [birth year]  10/04/2019  Ms. Mcatee was observed post Covid-19 immunization for 30 minutes based on pre-vaccination screening without incident. She was provided with Vaccine Information Sheet and instruction to access the V-Safe system.   Ms. Ollis was instructed to call 911 with any severe reactions post vaccine: [name] Difficulty breathing  . Swelling of face and throat  . A fast heartbeat  . A bad rash all over body  . Dizziness and weakness   Immunizations Administered    Name Date Dose VIS Date Route   Pfizer COVID-19 Vaccine 10/04/2019  1:59 PM 0.3 mL 06/01/2019 Intramuscular   Manufacturer: [employer], [employer]   Lot: [id number]   NDC: [address]-8

## 2019-10-19 ENCOUNTER — Other Ambulatory Visit: Payer: Self-pay | Admitting: Student in an Organized Health Care Education/Training Program

## 2019-10-29 ENCOUNTER — Ambulatory Visit: Payer: Medicare Other | Attending: Internal Medicine

## 2019-10-29 DIAGNOSIS — Z23 Encounter for immunization: Secondary | ICD-10-CM

## 2019-10-29 NOTE — Progress Notes (Signed)
   Covid-19 Vaccination Clinic  Name:  [name]    MRN: [id number] DOB: 1940/08/23  10/29/2019  Ms. Lorusso was observed post Covid-19 immunization for 15 minutes without incident. She was provided with Vaccine Information Sheet and instruction to access the V-Safe system.   Ms. Langston was instructed to call 911 with any severe reactions post vaccine: [name] Difficulty breathing  . Swelling of face and throat  . A fast heartbeat  . A bad rash all over body  . Dizziness and weakness   Immunizations Administered    Name Date Dose VIS Date Route   Pfizer COVID-19 Vaccine 10/29/2019 12:06 PM 0.3 mL 08/15/2018 Intramuscular   Manufacturer: [employer], [employer]   Lot: [id number]   NDC: [address]-5

## 2019-11-05 ENCOUNTER — Encounter: Payer: Self-pay | Admitting: Student in an Organized Health Care Education/Training Program

## 2019-11-05 ENCOUNTER — Ambulatory Visit (INDEPENDENT_AMBULATORY_CARE_PROVIDER_SITE_OTHER): Payer: Medicare Other | Admitting: Student in an Organized Health Care Education/Training Program

## 2019-11-05 VITALS — BP 119/74 | HR 71 | Temp 98.3°F | Ht 67.0 in | Wt 215.0 lb

## 2019-11-05 DIAGNOSIS — Z9181 History of falling: Secondary | ICD-10-CM

## 2019-11-05 DIAGNOSIS — G609 Hereditary and idiopathic neuropathy, unspecified: Secondary | ICD-10-CM

## 2019-11-05 DIAGNOSIS — M170 Bilateral primary osteoarthritis of knee: Secondary | ICD-10-CM

## 2019-11-05 DIAGNOSIS — G629 Polyneuropathy, unspecified: Secondary | ICD-10-CM

## 2019-11-05 DIAGNOSIS — M47816 Spondylosis without myelopathy or radiculopathy, lumbar region: Secondary | ICD-10-CM

## 2019-11-05 DIAGNOSIS — I10 Essential (primary) hypertension: Secondary | ICD-10-CM | POA: Diagnosis not present

## 2019-11-05 DIAGNOSIS — M810 Age-related osteoporosis without current pathological fracture: Secondary | ICD-10-CM

## 2019-11-05 DIAGNOSIS — E559 Vitamin D deficiency, unspecified: Secondary | ICD-10-CM

## 2019-11-05 DIAGNOSIS — E7800 Pure hypercholesterolemia, unspecified: Secondary | ICD-10-CM

## 2019-11-05 DIAGNOSIS — E785 Hyperlipidemia, unspecified: Secondary | ICD-10-CM

## 2019-11-05 NOTE — Assessment & Plan Note (Signed)
Moderate diffuse osteoarthritis especially in her knees and lower back.  Likely has degenerative disc disease as she has worsening of symptoms with leaning forward.  She has mild kyphosis of the thoracic spine.  We again talked about issues surrounding back braces.  I reiterated to her that there is no back brace that could correct this kyphosis or relieve the weight that is put on her low back.  She found some good benefit with physical therapy course back in September.  Still doing the exercises at home.  Has not had any falls.  Has a rolling walker, cane, and seems to have plenty of other home equipment.  Uses meloxicam 15 mg a few times a week for pain and stiffness which I think is fine.

## 2019-11-05 NOTE — Patient Instructions (Signed)
You may stop taking the potassium pills. You should get plenty of potassium through eating fruits, especially berries.

## 2019-11-05 NOTE — Addendum Note (Signed)
Addended by: Erlinda Hong T on: 11/05/2019 11:27 AM   Modules accepted: Orders

## 2019-11-05 NOTE — Assessment & Plan Note (Signed)
Fragility fracture of the right wrist in 2017.  On treatment with alendronate 70 mg daily, having some difficulty remembering to take this once weekly.  Offered 10 mg daily, wants to continue with once weekly but she is going to try harder.  Planning to complete 5 years of treatment.  Will check vitamin D levels today.

## 2019-11-05 NOTE — Assessment & Plan Note (Signed)
History of CVA in 2014.  Plan to continue with atorvastatin 80 mg daily.  Check LDL, goal less than 100 and ideally less than 70.  Continue with Plavix 75 mg daily.

## 2019-11-05 NOTE — Assessment & Plan Note (Signed)
Left hand paresthesia continues to be an intermittent problem.  Neuro exam normal today, no active numbness present.  Normal strength throughout.  I am not sure the etiology of this nightly problem.  Not function limiting currently.  Seems to be satisfied with the glove that she is using.  We will continue to monitor.

## 2019-11-05 NOTE — Assessment & Plan Note (Signed)
Blood pressure well controlled.  Plan to continue with atenolol 50 mg daily and lisinopril 20 mg daily.  This has been working well for her.  Check BMP today.

## 2019-11-05 NOTE — Progress Notes (Signed)
   Assessment and Plan:  See Encounters tab for problem-based medical decision making.   __________________________________________________________  HPI:   79 year old person here for follow-up of hypertension and osteoarthritis.  Doing well at home, lives with her daughter, independent in the activities of daily living.  Has had no recent falls.  Ambulates with either a cane or a rolling walker.  Has had some trouble with low back pain and has been trying a variety of braces which she has not found very helpful.  Reports that they are uncomfortable and too tight.  Asking if there is a brace that can help with her posture because she feels that her low back pain is worse when she leans forward.  She feels like she always has a slight forward lean to her posture now.  No chest pain, no shortness of breath.  Does her own grocery shopping.  Good adherence with her medications though she struggles with once weekly alendronate.  No side effects to her medicines.  No recent hospitalizations or emergency department visits.  Was seen in the acute care clinic for a intermittent left hand numbness.  Going on several months, almost nightly paresthesias of the left hand including all 5 fingers.  No weakness, no stiffness in the hand.  Not currently feeling any numbness.  Uses a glove on the left hand with some benefit.  No pain at the wrist.  No other weakness in any extremities.  __________________________________________________________  Problem List: Patient Active Problem List   Diagnosis Date Noted  . Osteoporosis 04/03/2009    Priority: High  . Bilateral leg edema due to diastolic dysfunction 02/22/2007    Priority: High  . Ischemic vascular disease 11/20/2018    Priority: Medium  . Chronic diastolic congestive heart failure (HCC) 09/28/2015    Priority: Medium  . Risk for falls 03/03/2015    Priority: Medium  . Anemia 05/01/2014    Priority: Medium  . Essential hypertension 09/14/2006   Priority: Medium  . Hyperlipidemia 04/27/2006    Priority: Medium  . Osteoarthritis 04/27/2006    Priority: Medium  . Lower back pain 04/25/2015    Priority: Low  . Healthcare maintenance 04/08/2014    Priority: Low  . Hyperglycemia 06/01/2012    Priority: Low  . Vitamin D deficiency 05/08/2010    Priority: Low  . Peripheral neuropathy 10/28/2009    Medications: Reconciled today in Epic __________________________________________________________  Physical Exam:  Vital Signs: Vitals:   11/05/19 0927  BP: 119/74  Pulse: 71  Temp: 98.3 F (36.8 C)  TempSrc: Oral  Weight: 215 lb (97.5 kg)  Height: 5\' 7"  (1.702 m)  PF: 100 L/min    Gen: Well appearing, NAD CV: RRR, 2 out of 6 early systolic murmur at the left upper sternal border Pulm: Normal effort, CTA throughout, no wheezing Ext: Warm, 1+ pitting edema both lower extremities, mild kyphosis of the thoracic spine Neuro: Alert, conversational, normal strength in the upper and lower extremities, normal sensation to pinprick in both hands, proximal muscle weakness and slow to stand from a seated position, uses a cane, wide-based gait

## 2019-11-06 LAB — LIPID PANEL
Chol/HDL Ratio: 2.8 ratio (ref 0.0–4.4)
Cholesterol, Total: 167 mg/dL (ref 100–199)
HDL: 60 mg/dL (ref >39)
LDL Chol Calc (NIH): 97 mg/dL (ref 0–99)
Triglycerides: 46 mg/dL (ref 0–149)
VLDL Cholesterol Cal: 10 mg/dL (ref 5–40)

## 2019-11-06 LAB — BMP8+ANION GAP
Anion Gap: 15 mmol/L (ref 10.0–18.0)
BUN/Creatinine Ratio: 21 (ref 12–28)
BUN: 22 mg/dL (ref 8–27)
CO2: 23 mmol/L (ref 20–29)
Calcium: 9.6 mg/dL (ref 8.7–10.3)
Chloride: 105 mmol/L (ref 96–106)
Creatinine, Ser: 1.07 mg/dL — ABNORMAL HIGH (ref 0.57–1.00)
GFR calc Af Amer: 57 mL/min/{1.73_m2} — ABNORMAL LOW (ref >59)
GFR calc non Af Amer: 50 mL/min/{1.73_m2} — ABNORMAL LOW (ref >59)
Glucose: 96 mg/dL (ref 65–99)
Potassium: 4.1 mmol/L (ref 3.5–5.2)
Sodium: 143 mmol/L (ref 134–144)

## 2019-11-06 LAB — VITAMIN D 25 HYDROXY (VIT D DEFICIENCY, FRACTURES): Vit D, 25-Hydroxy: 13.3 ng/mL — ABNORMAL LOW (ref 30.0–100.0)

## 2019-11-06 MED ORDER — CHOLECALCIFEROL 25 MCG (1000 UT) PO TABS: 1000.0000 [IU] | ORAL_TABLET | Freq: Every day | ORAL | 1 refills | Status: DC

## 2019-11-06 NOTE — Addendum Note (Signed)
Addended by: Erlinda Hong T on: 11/06/2019 08:48 AM   Modules accepted: Orders

## 2019-11-07 ENCOUNTER — Telehealth: Payer: Self-pay

## 2019-11-07 NOTE — Telephone Encounter (Signed)
Home Health Referral for Physical Therapy I will send through Community Message to see if we can get an agency to work with the patient.[facility] are at full capaicty not able to take patient.

## 2019-11-07 NOTE — Telephone Encounter (Signed)
[facility] not able to take patient at this time because of payer [place], [place] C5/19/20212:07 PM

## 2019-11-13 NOTE — Telephone Encounter (Signed)
Community Message to [facility] not able to take this patient [name] C5/25/20219:07 AM

## 2019-11-15 NOTE — Telephone Encounter (Signed)
No response from community message sent to [place] at [place] on 11/07/2019.  Spoke with Lurline Del at [facility]. She will check with her office to see if they are able to take patient for [facility] PT. L. Anaria Kroner, BSN, RN-BC

## 2019-11-15 NOTE — Telephone Encounter (Signed)
Call from [place] with [facility]. She is able to take patient for [facility] PT. SOC will be early next week. Tresa Endo is aware that patient prefers female therapist. Kinnie Feil, BSN, RN-BC

## 2019-11-21 DIAGNOSIS — M810 Age-related osteoporosis without current pathological fracture: Secondary | ICD-10-CM | POA: Diagnosis not present

## 2019-11-21 DIAGNOSIS — M545 Low back pain: Secondary | ICD-10-CM | POA: Diagnosis not present

## 2019-11-21 DIAGNOSIS — M170 Bilateral primary osteoarthritis of knee: Secondary | ICD-10-CM | POA: Diagnosis not present

## 2019-11-21 DIAGNOSIS — I10 Essential (primary) hypertension: Secondary | ICD-10-CM | POA: Diagnosis not present

## 2019-11-21 DIAGNOSIS — M40204 Unspecified kyphosis, thoracic region: Secondary | ICD-10-CM | POA: Diagnosis not present

## 2019-11-21 DIAGNOSIS — R2681 Unsteadiness on feet: Secondary | ICD-10-CM | POA: Diagnosis not present

## 2019-11-21 DIAGNOSIS — Z9181 History of falling: Secondary | ICD-10-CM | POA: Diagnosis not present

## 2019-11-21 DIAGNOSIS — E559 Vitamin D deficiency, unspecified: Secondary | ICD-10-CM | POA: Diagnosis not present

## 2019-11-26 NOTE — Telephone Encounter (Signed)
Spoke with Tresa Endo at [facility]. SOC for [facility] was 11/17/2019. Kinnie Feil, BSN, RN-BC

## 2020-01-29 ENCOUNTER — Other Ambulatory Visit: Payer: Self-pay | Admitting: Student in an Organized Health Care Education/Training Program

## 2020-02-04 DIAGNOSIS — B351 Tinea unguium: Secondary | ICD-10-CM | POA: Diagnosis not present

## 2020-02-04 DIAGNOSIS — L84 Corns and callosities: Secondary | ICD-10-CM | POA: Diagnosis not present

## 2020-02-04 DIAGNOSIS — I739 Peripheral vascular disease, unspecified: Secondary | ICD-10-CM | POA: Diagnosis not present

## 2020-02-07 ENCOUNTER — Other Ambulatory Visit: Payer: Self-pay | Admitting: Student in an Organized Health Care Education/Training Program

## 2020-02-18 ENCOUNTER — Encounter: Payer: Self-pay | Admitting: Internal Medicine

## 2020-02-18 ENCOUNTER — Other Ambulatory Visit: Payer: Self-pay

## 2020-02-18 ENCOUNTER — Ambulatory Visit (INDEPENDENT_AMBULATORY_CARE_PROVIDER_SITE_OTHER): Payer: Medicare Other | Admitting: Internal Medicine

## 2020-02-18 VITALS — BP 116/93 | HR 69 | Temp 98.2°F | Ht 67.0 in | Wt 205.4 lb

## 2020-02-18 DIAGNOSIS — M4003 Postural kyphosis, cervicothoracic region: Secondary | ICD-10-CM

## 2020-02-18 DIAGNOSIS — G629 Polyneuropathy, unspecified: Secondary | ICD-10-CM

## 2020-02-18 DIAGNOSIS — M40209 Unspecified kyphosis, site unspecified: Secondary | ICD-10-CM | POA: Insufficient documentation

## 2020-02-18 DIAGNOSIS — M810 Age-related osteoporosis without current pathological fracture: Secondary | ICD-10-CM | POA: Diagnosis not present

## 2020-02-18 DIAGNOSIS — M40203 Unspecified kyphosis, cervicothoracic region: Secondary | ICD-10-CM | POA: Diagnosis not present

## 2020-02-18 DIAGNOSIS — G609 Hereditary and idiopathic neuropathy, unspecified: Secondary | ICD-10-CM

## 2020-02-18 NOTE — Assessment & Plan Note (Signed)
Patient reports that for the past year she feels that her back has been more hunched over and she states that this position causes her to become very tired when she does her activities.  She denies any trauma or falls recently.  She denies any shortness of breath, chest pain, palpitations, weakness, headaches, lightheadedness, dizziness, or other symptoms.  She feels that it is slowly been worsening over the year.  She was wanting to have a back brace to see if it might help with her posture.  On exam she does have some mild kyphosis noted over the cervical/thoracic spine.  She does appear to have some kyphosis noted on exam.  Has a history of osteoporosis and this could be contributing versus postural kyphosis.  We discussed some potential exercises that could be used to improve her symptoms, as well as a back brace and orthopedic referral for further evaluation and intervention. -DME order for back brace -Provided information on kyphosis -Orthopedics referral

## 2020-02-18 NOTE — Assessment & Plan Note (Signed)
Patient continues to have this left hand numbness and tingling, she states that it has been present for a few months now, reports that it started this morning and it can last up to 30 minutes to an hour.  Also notes some left lower lip numbness.  States it happens about 3 times per week.  She uses a glove on that hand which she states helps at night.  She denies any weakness, trauma pain, or excessive use of her hand.  She states that meloxicam helps with the numbness.  She reports it does not limit her ability to do activities.  On exam she reports numbness on the entire hand, no specific dermatomal area noted, no weakness noted, negative Tinel sign, otherwise unremarkable. Unclear cause of her current symptoms.  Could still be a mild carpal tunnel syndrome however she denies any excessive overuse and has a negative Tinel sign.  We discussed the symptoms seem to be manageable at this time with the hand glove and meloxicam that we should continue this treatment for now.  Could consider neurology referral in the future for possible EMG testing.

## 2020-02-18 NOTE — Progress Notes (Signed)
CC: Hunched over back, left hand numbness  HPI:  [name] is a 79 y.o. with history listed below including low back pain, hypertension, hyperlipidemia, osteopenia on alendronate, and DJD presenting for follow-up of her hunched over back and left hand numbness.  Past Medical History:  Diagnosis Date   Abnormal electromyogram (EMG)    Low grade right S1 radiculopathy 7/93.   Diastolic dysfunction    a. 08/2012 Echo: EF 60-65%, Gr 2 DD, triv MR, PASP .   Distal radius fracture, right 12/22/2015   DJD (degenerative joint disease)    Elevated alkaline phosphatase level    Bone scan 08/2004 showed no evidence of abnormal bony activity.[name]   Herniated lumbar intervertebral disc    S/P L4-5 & L5-S1 laminotomy, foraminotomy, and L5-S1 diskectomy by Dr. Trey Sailors 11/01/2000.   Hyperlipemia    Hypertension    Iron deficiency anemia    Osteopenia    A DEXA bone density scan done 02/13/2009 showed a left femur neck young adult T-score of -1.6 (osteopenia), a right femur neck young adult T-score of -1.6 (osteopenia), and an AP spine young adult T-score of 1.3 (normal).  Her FRAX score gave an estimated 10 year probability of 6.6% for major osteoporotic fracture and 0.8% for hip fracture.   Peripheral neuropathy    Rotator cuff syndrome of right shoulder    S/P right shoulder arthroscopic debridement of a massive rotator cuff tear, greater tuberosity and resection of subacromial spur by Dr. Gean Birchwood 07/07/2005.   Stroke [facility]) 04/2013   SVT (supraventricular tachycardia) (HCC)    PSVT   Vitamin D deficiency    Review of Systems:   Constitutional: Negative for chills and fever.  Respiratory: Negative for shortness of breath.   Cardiovascular: Negative for chest pain and leg swelling.  Gastrointestinal: Negative for abdominal pain, nausea and vomiting.  Musculoskeletal: Positive for mild back pain and hunched over back. Neurological: Negative for dizziness and headaches.    Positive for left hand numbness.  Physical Exam:  Vitals:   02/18/20 1056  BP: (!) 116/93  Pulse: 69  Temp: 98.2 F (36.8 C)  TempSrc: Oral  SpO2: 100%  Weight: 205 lb 6.4 oz (93.2 kg)  Height: 5\' 7"  (1.702 m)   Physical Exam Constitutional:      Appearance: Normal appearance.  HENT:     Mouth/Throat:     Mouth: Mucous membranes are moist.     Pharynx: Oropharynx is clear.  Cardiovascular:     Rate and Rhythm: Normal rate and regular rhythm.     Pulses: Normal pulses.     Heart sounds: Normal heart sounds.  Pulmonary:     Effort: Pulmonary effort is normal.     Breath sounds: Normal breath sounds.  Abdominal:     General: Abdomen is flat. Bowel sounds are normal.     Palpations: Abdomen is soft.  Musculoskeletal:        General: Deformity (Mild kyphosis of cervical/thoracic spine) present. Normal range of motion.     Cervical back: Normal range of motion and neck supple.  Skin:    General: Skin is warm and dry.     Capillary Refill: Capillary refill takes less than 2 seconds.  Neurological:     General: No focal deficit present.     Mental Status: She is alert and oriented to person, place, and time.     Sensory: Sensory deficit (Decreased sensation to light touch in entire left hand) present.  Psychiatric:  Mood and Affect: Mood normal.        Behavior: Behavior normal.      Assessment & Plan:   See Encounters Tab for problem based charting.  Patient discussed with Dr. Mikey Bussing

## 2020-02-18 NOTE — Patient Instructions (Addendum)
[name],  It was a pleasure to see you today. Thank you for coming in.   Today we discussed your back issues.  This seems like it is Kyphosis, which is an abnormality in your spinal bones.  I have referred you to orthopedics for further evaluation.  I have ordered a back brace and provided you information on back exercises that you can try.    We also discussed your left hand numbness. Please continue using the brace at night and using the Meloxicam as needed.    Please return to clinic in 1-2 months or sooner if needed.   Thank you again for coming in.   Hermine Messick M.D.  Kyphosis  Kyphosis is a spinal disorder. It involves an excessive outward curve of the spine that causes abnormal rounding of the upper back. It occurs when the spinal bones (vertebrae) in the upper back (thoracic spine) become wedge-shaped and cause deformity. Kyphosis is sometimes called dowager's hump, hunchback, or roundback. It is most common among elderly people, but it can occur at any age. What are the causes? There are four main types of kyphosis, which have different causes. They include:  Postural kyphosis. This type is caused by poor posture or slouching. It does not involve severe abnormalities in the bone structure of the spine. This is the most common type of kyphosis, and it usually becomes noticeable during adolescence.  Congenital kyphosis. This is caused when the spine fails to develop normally while in the womb. A person is born with this type of kyphosis.  Scheuermann kyphosis. In this type of kyphosis, several of the vertebrae are more triangular in shape for unknown reasons. The curved spine usually becomes noticeable during adolescence.  Osteoporotic kyphosis. This type is caused by thinning and loss of density in the bones (osteoporosis), which results in small breaks (compression fractures) in the thoracic vertebrae. The compression fractures cause these vertebrae to become  wedge-shaped over time. Other possible causes include:  Certain syndromes, such as Marfan syndrome or Prader-Willi disease.  Cancer and treatment for cancer. Cancer and its treatment can weaken the vertebrae and cause compression fractures.  Disk degeneration. This refers to the drying out and shrinking of the soft, circular structures between the vertebrae as part of the natural aging process. What are the signs or symptoms? The symptoms of kyphosis vary based on the cause and how severe the curve is (the severity). Symptoms may include:  Rounded shoulders.  A visible hump on the back.  Fatigue.  Back pain (usually mild).  Spine stiffness.  Muscle tightness in the back of the thighs (hamstrings).  Loss of height. Severe symptoms of this condition include:  Loss of feeling in affected areas of the back.  Shortness of breath.  Weakness, numbness, or tingling in the legs. How is this diagnosed? This condition may be diagnosed based on:  A physical exam. Your health care provider may press on your spine to check for areas of tenderness. Severe cases of curvature will be noticeable by the rounding of the upper back. Milder cases may be harder to diagnose.  Your medical history. Your health care provider may ask about your general health and your symptoms.  X-rays.  MRI.  Tests of the nerves and nervous system (neurological tests). How is this treated? Treatment for this condition depends on your age and overall health, and the type and severity of your kyphosis. Non-surgical treatment may include:  Observation. Your health care provider may monitor your  condition over time to make sure that it does not cause problems or get worse.  Physical therapy. This involves movements and exercises to strengthen the back.  NSAIDs to help relieve back pain, such as aspirin, ibuprofen, and naproxen.  Wearing a back brace to correct the curve. Surgery (spinal fusion or kyphoplasty)  may be recommended if:  You have congenital kyphosis.  You have Scheuermann kyphosis with a curve greater than 75 degrees.  You have severe back pain that does not improve with non-surgical treatment. Follow these instructions at home:  Take over-the-counter and prescription medicines only as told by your health care provider.  Exercise regularly. Ask your health care provider what activities and exercises are best for you. Keeping your back strong and flexible may help to relieve symptoms and prevent your condition from getting worse.  If physical therapy was prescribed, do exercises as instructed.  Maintain good posture.  If you were prescribed a back brace: ? Wear the brace as told by your health care provider. ? Remove the brace for bathing. ? Avoid heavy lifting. Do not lift anything that is heavier than the limit that you are told until your health care provider says that it is safe.  Keep all follow-up visits as told by your health care provider. This is important, especially if your health care provider is monitoring your condition. Contact a health care provider if:  You notice a round hump forming on your back.  You have pain that does not get better with medicine. Summary  Kyphosis is a spinal disorder. It involves an excessive outward curve of the spine that causes abnormal rounding of the upper back.  Symptoms of kyphosis vary based on the cause and severe the curve is.  There are non-surgical and surgical treatments for kyphosis.  Exercise regularly. Ask your health care provider what activities and exercises are best for you. Keeping your back strong and flexible may help to relieve symptoms and prevent your condition from getting worse. This information is not intended to replace advice given to you by your health care provider. Make sure you discuss any questions you have with your health care provider. Document Revised: 05/20/2017 Document Reviewed:  09/08/2016 Elsevier Patient Education  2020 [employer].

## 2020-02-20 NOTE — Progress Notes (Signed)
Internal Medicine Clinic Attending ° °Case discussed with Dr. Krienke  At the time of the visit.  We reviewed the resident’s history and exam and pertinent patient test results.  I agree with the assessment, diagnosis, and plan of care documented in the resident’s note.  °

## 2020-03-03 DIAGNOSIS — M25562 Pain in left knee: Secondary | ICD-10-CM | POA: Diagnosis not present

## 2020-03-03 DIAGNOSIS — M25561 Pain in right knee: Secondary | ICD-10-CM | POA: Diagnosis not present

## 2020-03-03 DIAGNOSIS — M542 Cervicalgia: Secondary | ICD-10-CM | POA: Diagnosis not present

## 2020-03-04 ENCOUNTER — Telehealth: Payer: Self-pay | Admitting: Student in an Organized Health Care Education/Training Program

## 2020-03-04 NOTE — Telephone Encounter (Signed)
Dr Oswaldo Done what about the pt's question about taking Prednisone or not. Thanks

## 2020-03-04 NOTE — Telephone Encounter (Signed)
Order for back brace was faxed to the [facility] 01/23/2019. Call placed to [facility]. Per Joni Reining, patient received the TLS (Thoracic Lumbar Sacral) back brace on 02/14/2019 (She signed for it at the [facility]). Call placed to patient. States she received that brace but it didn't help her stand upright like she wants. States she also bought one at [facility] supply which also did not help. States she saw orthopedics yesterday. Advised she call them to recommend which brace will help and where to purchase. Please see PCP's note from 01/22/2019 where he stated associated risks with back brace. Kinnie Feil, BSN, RN-BC

## 2020-03-04 NOTE — Telephone Encounter (Signed)
I have recommended against a back brace in her case for a year. Will forward on to the ordering doc to see if there was anything else she was looking for out of this brace, and to close the loop on this experience.

## 2020-03-04 NOTE — Telephone Encounter (Signed)
Pt stated the physician order her a back brace, pls contact pt [phone]

## 2020-03-04 NOTE — Telephone Encounter (Signed)
Called pt - informed I will let the nurse who deals with DME to f/u. Also stated she went to Ortho who prescribed Prednisone (unsure of dosage); wants to know if ok to take, she wants to check with Dr Oswaldo Done first. Aware may elevate her BS.

## 2020-03-04 NOTE — Telephone Encounter (Signed)
Good question. I do not have the clinic notes from ortho, so I do not know the indication for which it was prescribed. Can we get that?

## 2020-03-05 NOTE — Telephone Encounter (Signed)
Hello,  I attempted to contact patient. We had discussed exercises that she can try for the kyphosis and back issues during our visit. We also discussed that there may be limited, if any, benefit from the back brace however brace was ordered based on patient request. These are the instructions that we discussed during our visit:   Have the patient stand up with their back and feet against the wall to obtain as upright a posture as possible, with their eye gaze straight forward. Once positioned correctly, teach them to engage the diaphragm for deep breathing to make them aware of their core abdominal muscles. Next, instruct them to take five deep breaths, paying attention to their posture so they can continue to practice good posture throughout the day.  Secondly, instruct the patient to raise their arms above their head and then into a cactus position, extending their chest upwards and forwards, and repeat the breathing exercises.  Have the patient transition to an "all fours" posture on the floor, and instruct them to practice the cat-cow positions (alternating spinal rounding and extension). With the cow, take a deep breath, and with the cat, exhale. Repeat about five times.   I will attempt to contact her again later today to relay this information. Thank you.

## 2020-03-05 NOTE — Telephone Encounter (Signed)
Called [facility] for office notes; message left on vm for [place].

## 2020-03-07 NOTE — Telephone Encounter (Signed)
Called [facility]'s office ; message left for Verlon Au to fax notes to our office.

## 2020-03-12 ENCOUNTER — Encounter: Payer: Self-pay | Admitting: Adult Health

## 2020-03-12 ENCOUNTER — Ambulatory Visit: Payer: Medicare Other | Admitting: Adult Health

## 2020-03-14 ENCOUNTER — Other Ambulatory Visit: Payer: Self-pay | Admitting: Student in an Organized Health Care Education/Training Program

## 2020-03-15 ENCOUNTER — Other Ambulatory Visit: Payer: Self-pay | Admitting: Student in an Organized Health Care Education/Training Program

## 2020-04-02 ENCOUNTER — Ambulatory Visit: Payer: Medicare Other | Admitting: Podiatry

## 2020-04-16 ENCOUNTER — Ambulatory Visit: Payer: Medicare Other | Admitting: Podiatry

## 2020-04-17 ENCOUNTER — Other Ambulatory Visit: Payer: Self-pay | Admitting: Student in an Organized Health Care Education/Training Program

## 2020-05-04 ENCOUNTER — Other Ambulatory Visit: Payer: Self-pay | Admitting: Student in an Organized Health Care Education/Training Program

## 2020-05-05 ENCOUNTER — Ambulatory Visit (INDEPENDENT_AMBULATORY_CARE_PROVIDER_SITE_OTHER): Payer: Medicare Other | Admitting: Adult Health

## 2020-05-05 ENCOUNTER — Encounter: Payer: Self-pay | Admitting: Adult Health

## 2020-05-05 VITALS — BP 128/84 | HR 67 | Ht 67.0 in | Wt 201.6 lb

## 2020-05-05 DIAGNOSIS — R202 Paresthesia of skin: Secondary | ICD-10-CM | POA: Diagnosis not present

## 2020-05-05 DIAGNOSIS — Z8673 Personal history of transient ischemic attack (TIA), and cerebral infarction without residual deficits: Secondary | ICD-10-CM

## 2020-05-05 DIAGNOSIS — R200 Anesthesia of skin: Secondary | ICD-10-CM

## 2020-05-05 DIAGNOSIS — Z79899 Other long term (current) drug therapy: Secondary | ICD-10-CM | POA: Diagnosis not present

## 2020-05-05 NOTE — Progress Notes (Signed)
PATIENT: [name] DOB: 03-01-1941  REASON FOR VISIT: follow up HISTORY FROM: patient  HISTORY OF PRESENT ILLNESS: Today 05/05/20:  [name]  is a 79 year old female with a history of cerebrovascular disease and stroke.  She returns today for complaints of left hand numbness.  When the patient was seen in 2019 it was reported then that she continued to experience numbness in the left hand on occasion.  The patient reports that she has remained on Plavix.  Blood pressure remains in good control.  Vitamin D level was low but she recently started on supplements.  She states that in the last 3 months she has experienced left hand tingling and numbness off and on.  To her knowledge nothing that she does makes the tingling or numbness worse.  She does have a glove that she wears that she feels makes the symptoms better.  She denies any new symptoms such as weakness in the extremities.  No changes with her speech.  No injury to the hand or the neck.  The numbness and tingling affects the entire hand not certain digits.  HISTORY Mrs. [name] is a 79 year old female with a history of cerebrovascular disease and stroke. She returns today for an evaluation. The patient's primary care provider manages her cholesterol and hypertension. Today her blood pressure is good-it is 120/71. The patient is on atorvastatin for her cholesterol. Her recent lipid panel in March showed that her LDL was 80. The patient denies any new neurological symptoms. Denies any strokelike symptoms. She continues on Plavix for stroke prevention. She will occasionally have numbness in the left hand- this has been ongoing. The patient had carotid Doppler studies in February 2016 that were unremarkable. The patient reports that she does have some trouble with the right knee she has been seen orthopedic who has recommended knee replacement. She returns today for an evaluation.  REVIEW OF SYSTEMS: Out of a complete 14 system review of  symptoms, the patient complains only of the following symptoms, and all other reviewed systems are negative.  See HPI  ALLERGIES: Allergies  Allergen Reactions  . Fluzone [Flu Virus Vaccine] Other (See Comments)    Oculorespiratory syndrome with high dose Fluzone vaccine 2019  . Sulfonamide Derivatives Shortness Of Breath  . Penicillins Swelling and Other (See Comments)    REACTION: Local swelling in right arm after injection. Has patient had a PCN reaction causing immediate rash, facial/tongue/throat swelling, SOB or lightheadedness with hypotension:No Has patient had a PCN reaction causing severe rash involving mucus membranes or skin necrosis:No Has patient had a PCN reaction that required hospitalization:No Has patient had a PCN reaction occurring within the last 10 years:no If all of the above answers are "NO", then may proceed with Cephalosporin use.     HOME MEDICATIONS: Outpatient Medications Prior to Visit  Medication Sig Dispense Refill  . alendronate (FOSAMAX) 70 MG tablet TAKE 1 TABLET EVERY 7 DAYS WITH A FULL GLASS OF WATER ON AN EMPTY STOMACH 12 tablet 3  . atenolol (TENORMIN) 50 MG tablet TAKE 1 TABLET BY MOUTH EVERY DAY 90 tablet 1  . atorvastatin (LIPITOR) 80 MG tablet TAKE 1 TABLET BY MOUTH EVERY DAY AT 6PM 90 tablet 2  . Cholecalciferol 25 MCG (1000 UT) tablet Take 1 tablet (1,000 Units total) by mouth daily. 90 tablet 1  . clopidogrel (PLAVIX) 75 MG tablet TAKE 1 TABLET BY MOUTH EVERY DAY 90 tablet 3  . furosemide (LASIX) 40 MG tablet TAKE 2 TABLETS BY  MOUTH EVERY DAY 180 tablet 1  . lisinopril (ZESTRIL) 20 MG tablet TAKE 1 TABLET BY MOUTH EVERY DAY 90 tablet 3  . meloxicam (MOBIC) 15 MG tablet TAKE 1 TABLET BY MOUTH EVERY DAY AS NEEDED FOR PAIN 30 tablet 2   No facility-administered medications prior to visit.    PAST MEDICAL HISTORY: Past Medical History:  Diagnosis Date  . Abnormal electromyogram (EMG)    Low grade right S1 radiculopathy 7/93.  .  Diastolic dysfunction    a. 08/2012 Echo: EF 60-65%, Gr 2 DD, triv MR, PASP .  [name] Distal radius fracture, right 12/22/2015  . DJD (degenerative joint disease)   . Elevated alkaline phosphatase level    Bone scan 08/2004 showed no evidence of abnormal bony activity..  . Herniated lumbar intervertebral disc    S/P L4-5 & L5-S1 laminotomy, foraminotomy, and L5-S1 diskectomy by Dr. Trey Sailors 11/01/2000.  [name] Hyperlipemia   . Hypertension   . Iron deficiency anemia   . Osteopenia    A DEXA bone density scan done 02/13/2009 showed a left femur neck young adult T-score of -1.6 (osteopenia), a right femur neck young adult T-score of -1.6 (osteopenia), and an AP spine young adult T-score of 1.3 (normal).  Her FRAX score gave an estimated 10 year probability of 6.6% for major osteoporotic fracture and 0.8% for hip fracture.  . Peripheral neuropathy   . Rotator cuff syndrome of right shoulder    S/P right shoulder arthroscopic debridement of a massive rotator cuff tear, greater tuberosity and resection of subacromial spur by Dr. Gean Birchwood 07/07/2005.  . Stroke (HCC) 04/2013  . SVT (supraventricular tachycardia) (HCC)    PSVT  . Vitamin D deficiency     PAST SURGICAL HISTORY: Past Surgical History:  Procedure Laterality Date  . BACK SURGERY    . LAPAROSCOPIC CHOLECYSTECTOMY  1998  . LUMBAR DISC SURGERY  11/01/2000    L4-5, L5-S1 laminotomy, foraminotomy and L5-S1 diskectomy  . SHOULDER ARTHROSCOPY  1/172007   S/P right shoulder arthroscopic debridement of a massive rotator cuff tear, greater tuberoplasty and resection of subacromial spur by Dr. Gean Birchwood on 07/07/2005.  [name] TOTAL ABDOMINAL HYSTERECTOMY  1992    FAMILY HISTORY: Family History  Problem Relation Age of Onset  . Ovarian cancer Mother 56  . Hypertension Mother   . Heart attack Mother 9  . Cancer Brother   . Hypertension Sister   . Hypertension Sister   . Neuropathy Sister   . Breast cancer Neg Hx   . Colon cancer Neg Hx   .  Lung cancer Neg Hx     SOCIAL HISTORY: Social History   Socioeconomic History  . Marital status: Single    Spouse name: Not on file  . Number of children: 6  . Years of education: 121th  . Highest education level: Not on file  Occupational History  . Occupation: retired  Tobacco Use  . Smoking status: Former Smoker    Packs/day: 0.15    Years: 2.00    Pack years: 0.30    Types: Cigarettes    Quit date: 10/07/1999    Years since quitting: 20.5  . Smokeless tobacco: Never Used  Substance and Sexual Activity  . Alcohol use: No    Alcohol/week: 0.0 standard drinks  . Drug use: No  . Sexual activity: Not Currently  Other Topics Concern  . Not on file  Social History Narrative   Patient is single and lives at home alone.  Disabled.   Education high school.   Right handed.   Caffeine sometimes one cup of coffee.   Social Determinants of Health   Financial Resource Strain:   . Difficulty of Paying Living Expenses: Not on file  Food Insecurity:   . Worried About Programme researcher, broadcasting/film/video in the Last Year: Not on file  . Ran Out of Food in the Last Year: Not on file  Transportation Needs:   . Lack of Transportation (Medical): Not on file  . Lack of Transportation (Non-Medical): Not on file  Physical Activity:   . Days of Exercise per Week: Not on file  . Minutes of Exercise per Session: Not on file  Stress:   . Feeling of Stress : Not on file  Social Connections:   . Frequency of Communication with Friends and Family: Not on file  . Frequency of Social Gatherings with Friends and Family: Not on file  . Attends Religious Services: Not on file  . Active Member of Clubs or Organizations: Not on file  . Attends Banker Meetings: Not on file  . Marital Status: Not on file  Intimate Partner Violence:   . Fear of Current or Ex-Partner: Not on file  . Emotionally Abused: Not on file  . Physically Abused: Not on file  . Sexually Abused: Not on file       PHYSICAL EXAM  Vitals:   05/05/20 1433  BP: 128/84  Pulse: 67  Weight: 201 lb 9.6 oz (91.4 kg)  Height: 5\' 7"  (1.702 m)   Body mass index is 31.58 kg/m.  Generalized: Well developed, in no acute distress   Neurological examination  Mentation: Alert oriented to time, place, history taking. Follows all commands speech and language fluent Cranial nerve II-XII: Pupils were equal round reactive to light. Extraocular movements were full, visual field were full on confrontational test.  Head turning and shoulder shrug  were normal and symmetric. Motor: The motor testing reveals 5 over 5 strength of all 4 extremities. Good symmetric motor tone is noted throughout.  Sensory: Sensory testing is intact to soft touch on all 4 extremities. No evidence of extinction is noted.  Coordination: Cerebellar testing reveals good finger-nose-finger and heel-to-shin bilaterally.  Gait and station: Gait is unsteady. Using a cane with ambulating Reflexes: Deep tendon reflexes are symmetric and normal bilaterally.   DIAGNOSTIC DATA (LABS, IMAGING, TESTING) - I reviewed patient records, labs, notes, testing and imaging myself where available.  Lab Results  Component Value Date   WBC 4.6 10/11/2016   HGB 11.7 10/11/2016   HCT 35.4 10/11/2016   MCV 85 10/11/2016   PLT 157 10/11/2016      Component Value Date/Time   NA 143 11/05/2019 0959   K 4.1 11/05/2019 0959   CL 105 11/05/2019 0959   CO2 23 11/05/2019 0959   GLUCOSE 96 11/05/2019 0959   GLUCOSE 106 (H) 12/25/2015 0350   BUN 22 11/05/2019 0959   CREATININE 1.07 (H) 11/05/2019 0959   CREATININE 0.66 08/22/2014 0925   CALCIUM 9.6 11/05/2019 0959   PROT 6.2 (L) 09/27/2015 1746   ALBUMIN 3.6 09/27/2015 1746   AST 16 09/27/2015 1746   ALT 13 (L) 09/27/2015 1746   ALKPHOS 101 09/27/2015 1746   BILITOT 1.1 09/27/2015 1746   GFRNONAA 50 (L) 11/05/2019 0959   GFRNONAA 88 08/22/2014 0925   GFRAA 57 (L) 11/05/2019 0959   GFRAA >89  08/22/2014 0925   Lab Results  Component Value Date  CHOL 167 11/05/2019   HDL 60 11/05/2019   LDLCALC 97 11/05/2019   TRIG 46 11/05/2019   CHOLHDL 2.8 11/05/2019   Lab Results  Component Value Date   HGBA1C 4.7 (L) 10/11/2016   Lab Results  Component Value Date   VITAMINB12 1,149 (H) 09/28/2015   Lab Results  Component Value Date   TSH 2.260 09/05/2017      ASSESSMENT AND PLAN 79 y.o. year old female  has a past medical history of Abnormal electromyogram (EMG), Diastolic dysfunction, Distal radius fracture, right (12/22/2015), DJD (degenerative joint disease), Elevated alkaline phosphatase level, Herniated lumbar intervertebral disc, Hyperlipemia, Hypertension, Iron deficiency anemia, Osteopenia, Peripheral neuropathy, Rotator cuff syndrome of right shoulder, Stroke (HCC) (04/2013), SVT (supraventricular tachycardia) (HCC), and Vitamin D deficiency. here with:  1. Hx of stroke  2. Left hand numbness/tingling  Discussed the patient's symptoms with Dr. Lucia Gaskins.  We will repeat MRI of the brain with and without contrast to ensure the patient is not had any additional strokes.  If MRI is relatively unremarkable we can consider nerve conduction study with EMG with Dr. Lucia Gaskins.  Patient was advised on stroke risk factors.  Advised to maintain strict control of blood pressure with goal less than 130/90, LDL less than 70 and hemoglobin A1c less than 6.5%.  I will recheck hemoglobin A1c.  Advised if symptoms worsen or she develops new symptoms she should let us know.  Follow-up in 6 months or sooner if needed  I spent 25 minutes of face-to-face and non-face-to-face time with patient.  This included previsit chart review, lab review, study review, order entry, electronic health record documentation, patient education.  Butch Penny, MSN, NP-C 05/05/2020, 2:56 PM [facility] [address] [place], [place] [address] [phone]

## 2020-05-05 NOTE — Patient Instructions (Signed)
Your Plan:  MRI brain  Lab work today Maintain strict control of BP <130/90, HGBA1C <6.5%, LDL <70 If your symptoms worsen or you develop new symptoms please let us know.   Thank you for coming to see [place] at [facility]. I hope we have been able to provide you high quality care today.  You may receive a patient satisfaction survey over the next few weeks. We would appreciate your feedback and comments so that we may continue to improve ourselves and the health of our patients.

## 2020-05-06 ENCOUNTER — Telehealth: Payer: Self-pay | Admitting: Adult Health

## 2020-05-06 LAB — HEMOGLOBIN A1C
Est. average glucose Bld gHb Est-mCnc: 97 mg/dL
Hgb A1c MFr Bld: 5 % (ref 4.8–5.6)

## 2020-05-06 LAB — COMPREHENSIVE METABOLIC PANEL
ALT: 10 IU/L (ref 0–32)
AST: 15 IU/L (ref 0–40)
Albumin/Globulin Ratio: 2.1 (ref 1.2–2.2)
Albumin: 4.5 g/dL (ref 3.7–4.7)
Alkaline Phosphatase: 100 IU/L (ref 44–121)
BUN/Creatinine Ratio: 16 (ref 12–28)
BUN: 17 mg/dL (ref 8–27)
Bilirubin Total: 0.8 mg/dL (ref 0.0–1.2)
CO2: 27 mmol/L (ref 20–29)
Calcium: 9.9 mg/dL (ref 8.7–10.3)
Chloride: 106 mmol/L (ref 96–106)
Creatinine, Ser: 1.07 mg/dL — ABNORMAL HIGH (ref 0.57–1.00)
GFR calc Af Amer: 57 mL/min/{1.73_m2} — ABNORMAL LOW (ref >59)
GFR calc non Af Amer: 50 mL/min/{1.73_m2} — ABNORMAL LOW (ref >59)
Globulin, Total: 2.1 g/dL (ref 1.5–4.5)
Glucose: 85 mg/dL (ref 65–99)
Potassium: 4 mmol/L (ref 3.5–5.2)
Sodium: 144 mmol/L (ref 134–144)
Total Protein: 6.6 g/dL (ref 6.0–8.5)

## 2020-05-06 NOTE — Telephone Encounter (Signed)
[employer] medicare/medicaid order sent to [facility]. No auth they will reach out to the patient to schedule.  °

## 2020-05-12 ENCOUNTER — Other Ambulatory Visit: Payer: Medicaid Other

## 2020-05-26 DIAGNOSIS — I739 Peripheral vascular disease, unspecified: Secondary | ICD-10-CM | POA: Diagnosis not present

## 2020-05-26 DIAGNOSIS — L84 Corns and callosities: Secondary | ICD-10-CM | POA: Diagnosis not present

## 2020-05-26 DIAGNOSIS — I872 Venous insufficiency (chronic) (peripheral): Secondary | ICD-10-CM | POA: Diagnosis not present

## 2020-05-26 DIAGNOSIS — B351 Tinea unguium: Secondary | ICD-10-CM | POA: Diagnosis not present

## 2020-05-27 DIAGNOSIS — H25813 Combined forms of age-related cataract, bilateral: Secondary | ICD-10-CM | POA: Diagnosis not present

## 2020-05-27 DIAGNOSIS — H0520 Unspecified exophthalmos: Secondary | ICD-10-CM | POA: Diagnosis not present

## 2020-05-31 ENCOUNTER — Other Ambulatory Visit: Payer: Self-pay

## 2020-05-31 ENCOUNTER — Ambulatory Visit
Admission: RE | Admit: 2020-05-31 | Discharge: 2020-05-31 | Disposition: A | Discharge status: Home / Self Care | Payer: Medicare Other | Source: Ambulatory Visit | Attending: Adult Health | Admitting: Adult Health

## 2020-05-31 DIAGNOSIS — R200 Anesthesia of skin: Secondary | ICD-10-CM

## 2020-05-31 DIAGNOSIS — R202 Paresthesia of skin: Secondary | ICD-10-CM

## 2020-05-31 MED ORDER — GADOBENATE DIMEGLUMINE 529 MG/ML IV SOLN
18.0000 mL | Freq: Once | INTRAVENOUS | Status: AC | PRN
  Administered 2020-05-31: 18 mL via INTRAVENOUS

## 2020-06-05 ENCOUNTER — Telehealth: Payer: Self-pay | Admitting: *Deleted

## 2020-06-05 NOTE — Telephone Encounter (Signed)
I called pt and relayed the results of her MRI per MM/NP. She will monitor her L hand and see how things go and if needed will call us back.

## 2020-06-05 NOTE — Telephone Encounter (Signed)
-----   Message from Butch Penny, NP sent at 06/04/2020  3:53 PM EST ----- MRI showed old strokes in 1 new focus in the cerebellar region.  However is not clear when this occurred as the scan was compared to 2017.  Left hand numbness and tingling is most likely the residual of old infarcts.  Certainly can consider nerve conduction study with EMG if the patient wants.

## 2020-07-01 DIAGNOSIS — H25812 Combined forms of age-related cataract, left eye: Secondary | ICD-10-CM | POA: Diagnosis not present

## 2020-07-01 DIAGNOSIS — H25811 Combined forms of age-related cataract, right eye: Secondary | ICD-10-CM | POA: Diagnosis not present

## 2020-07-16 ENCOUNTER — Other Ambulatory Visit: Payer: Self-pay | Admitting: Student in an Organized Health Care Education/Training Program

## 2020-07-17 DIAGNOSIS — H25811 Combined forms of age-related cataract, right eye: Secondary | ICD-10-CM | POA: Diagnosis not present

## 2020-07-17 DIAGNOSIS — H2511 Age-related nuclear cataract, right eye: Secondary | ICD-10-CM | POA: Diagnosis not present

## 2020-08-01 DIAGNOSIS — H2512 Age-related nuclear cataract, left eye: Secondary | ICD-10-CM | POA: Diagnosis not present

## 2020-08-01 DIAGNOSIS — H25812 Combined forms of age-related cataract, left eye: Secondary | ICD-10-CM | POA: Diagnosis not present

## 2020-09-04 ENCOUNTER — Other Ambulatory Visit: Payer: Self-pay | Admitting: Student in an Organized Health Care Education/Training Program

## 2020-09-08 ENCOUNTER — Other Ambulatory Visit: Payer: Self-pay | Admitting: Student in an Organized Health Care Education/Training Program

## 2020-09-12 ENCOUNTER — Ambulatory Visit: Payer: Medicare Other | Admitting: Podiatry

## 2020-09-15 ENCOUNTER — Encounter: Payer: Medicare Other | Admitting: Student in an Organized Health Care Education/Training Program

## 2020-09-18 ENCOUNTER — Encounter: Payer: Self-pay | Admitting: *Deleted

## 2020-09-18 NOTE — Progress Notes (Unsigned)

## 2020-09-22 ENCOUNTER — Ambulatory Visit (INDEPENDENT_AMBULATORY_CARE_PROVIDER_SITE_OTHER): Payer: Medicare Other | Admitting: Student in an Organized Health Care Education/Training Program

## 2020-09-22 ENCOUNTER — Encounter: Payer: Self-pay | Admitting: Student in an Organized Health Care Education/Training Program

## 2020-09-22 VITALS — BP 126/71 | HR 73 | Temp 98.4°F | Wt 194.2 lb

## 2020-09-22 DIAGNOSIS — I10 Essential (primary) hypertension: Secondary | ICD-10-CM

## 2020-09-22 DIAGNOSIS — R600 Localized edema: Secondary | ICD-10-CM | POA: Diagnosis not present

## 2020-09-22 DIAGNOSIS — E7800 Pure hypercholesterolemia, unspecified: Secondary | ICD-10-CM

## 2020-09-22 DIAGNOSIS — I998 Other disorder of circulatory system: Secondary | ICD-10-CM | POA: Diagnosis not present

## 2020-09-22 DIAGNOSIS — M810 Age-related osteoporosis without current pathological fracture: Secondary | ICD-10-CM | POA: Diagnosis not present

## 2020-09-22 MED ORDER — FLUTICASONE PROPIONATE 50 MCG/ACT NA SUSP: 1.0000 | Freq: Every day | NASAL | 2 refills | Status: DC

## 2020-09-22 NOTE — Progress Notes (Signed)
Attestation for Student Documentation:  I personally was present and performed or re-performed the history, physical exam and medical decision-making activities of this service and have verified that the service and findings are accurately documented in the student's note.  Tyson Alias, MD 09/22/2020, 1:50 PM

## 2020-09-22 NOTE — Progress Notes (Signed)
Subjective:   Patient ID: [name] female   DOB: 1940/10/05 80 y.o.   MRN: [id number]  HPI: [name] is a 80 y.o. female with a past medical history of hypertension, ischemic vascular disease, diastolic heart failure, osteoarthritis, osteoporosis, kyphosis, and hyperlipidemia who presents today for a routine follow up.   Her main concern is persistent clear rhinorrhea over the past year, predominantly from her left nostril. She reports having seasonal rhinorrhea for a majority of her adult life but this year the symptoms have persisted for almost the whole year. She endorses decreased sense of taste and eye redness but reports that this is due to her bilateral cataract surgery last month. Brimonidine eye drops have been reducing the redness. Denies white / green nasal discharge, eye drainage, eye itchiness, fever, recent travel, sick contacts, headache, loss of smell, and difficulty breathing from her nose. She has tried over-the-counter Claritin for her symptoms but had no improvement.   She has been doing well overall at home. She is using a cane and walker to get around but has not had any falls and does not have rugs at home. She does find it "annoying" that she is unable to stand up straight due to the kyphosis and has tried various back braces without improvement. She does report mild arthralgias in her hands and knees but not functionally limiting. It has been hard to remember to take alendronate once weekly but she has a pill box and is considering using it so she can remember better. No concerns with any other medications and has been taking everything as prescribed.   Past Medical History:  Diagnosis Date  . Abnormal electromyogram (EMG)    Low grade right S1 radiculopathy 7/93.  . Diastolic dysfunction    a. 08/2012 Echo: EF 60-65%, Gr 2 DD, triv MR, PASP .  [name] Distal radius fracture, right 12/22/2015  . DJD (degenerative joint disease)   . Elevated alkaline  phosphatase level    Bone scan 08/2004 showed no evidence of abnormal bony activity..  . Herniated lumbar intervertebral disc    S/P L4-5 & L5-S1 laminotomy, foraminotomy, and L5-S1 diskectomy by Dr. Trey Sailors 11/01/2000.  [name] Hyperlipemia   . Hypertension   . Iron deficiency anemia   . Osteopenia    A DEXA bone density scan done 02/13/2009 showed a left femur neck young adult T-score of -1.6 (osteopenia), a right femur neck young adult T-score of -1.6 (osteopenia), and an AP spine young adult T-score of 1.3 (normal).  Her FRAX score gave an estimated 10 year probability of 6.6% for major osteoporotic fracture and 0.8% for hip fracture.  . Peripheral neuropathy   . Rotator cuff syndrome of right shoulder    S/P right shoulder arthroscopic debridement of a massive rotator cuff tear, greater tuberosity and resection of subacromial spur by Dr. Gean Birchwood 07/07/2005.  . Stroke (HCC) 04/2013  . SVT (supraventricular tachycardia) (HCC)    PSVT  . Vitamin D deficiency    Current Outpatient Medications  Medication Sig Dispense Refill  . fluticasone (FLONASE) 50 MCG/ACT nasal spray Place 1 spray into both nostrils daily. 16 g 2  . atenolol (TENORMIN) 50 MG tablet TAKE 1 TABLET BY MOUTH EVERY DAY 90 tablet 1  . atorvastatin (LIPITOR) 80 MG tablet TAKE 1 TABLET BY MOUTH EVERY DAY AT 6PM 90 tablet 2  . cholecalciferol (VITAMIN D3) 25 MCG (1000 UNIT) tablet TAKE 1 TABLET BY MOUTH EVERY DAY 90 tablet 1  .  clopidogrel (PLAVIX) 75 MG tablet TAKE 1 TABLET BY MOUTH EVERY DAY 90 tablet 3  . furosemide (LASIX) 40 MG tablet TAKE 2 TABLETS BY MOUTH EVERY DAY 180 tablet 1  . lisinopril (ZESTRIL) 20 MG tablet TAKE 1 TABLET BY MOUTH EVERY DAY 90 tablet 3  . meloxicam (MOBIC) 15 MG tablet TAKE 1 TABLET BY MOUTH EVERY DAY AS NEEDED FOR PAIN 30 tablet 2   No current facility-administered medications for this visit.   Family History  Problem Relation Age of Onset  . Ovarian cancer Mother 52  . Hypertension Mother   .  Heart attack Mother 36  . Cancer Brother   . Hypertension Sister   . Hypertension Sister   . Neuropathy Sister   . Breast cancer Neg Hx   . Colon cancer Neg Hx   . Lung cancer Neg Hx    Social History   Socioeconomic History  . Marital status: Single    Spouse name: Not on file  . Number of children: 6  . Years of education: 121th  . Highest education level: Not on file  Occupational History  . Occupation: retired  Tobacco Use  . Smoking status: Former Smoker    Packs/day: 0.15    Years: 2.00    Pack years: 0.30    Types: Cigarettes    Quit date: 10/07/1999    Years since quitting: 20.9  . Smokeless tobacco: Never Used  Substance and Sexual Activity  . Alcohol use: No    Alcohol/week: 0.0 standard drinks  . Drug use: No  . Sexual activity: Not Currently  Other Topics Concern  . Not on file  Social History Narrative   Patient is single and lives at home alone.    Disabled.   Education high school.   Right handed.   Caffeine sometimes one cup of coffee.   Social Determinants of Health   Financial Resource Strain: Not on file  Food Insecurity: Not on file  Transportation Needs: Not on file  Physical Activity: Not on file  Stress: Not on file  Social Connections: Not on file   Review of Systems: Pertinent items noted in HPI and remainder of comprehensive ROS otherwise negative. Objective:  Physical Exam: Vitals:   09/22/20 1013  BP: 126/71  Pulse: 73  Temp: 98.4 F (36.9 C)  TempSrc: Oral  SpO2: 100%  Weight: 194 lb 3.2 oz (88.1 kg)   General: Well appearing and in no acute distress, appears stated age Neuro: A&O x4, normal affect HEENT: Normocephalic, atraumatic, EOMI, normal sclerae without scleral icterus or injection. Nasal mucosa dry with mild swelling of nasal turbinates, clear oropharynx Cardiovascular: RRR, 2/6 systolic murmur at LUSB, Normal S1 and S2 without S3 or S4. Pulses 2+ in bilateral UE and LE. Significant bilateral lower extremity  edema. Pulmonary: CTAB, no wheezes, rhonchi or rales. Normal WOB, no clubbing  MSK: Normal ROM of all extremities. Kyphosis of thoracic spine   Assessment & Plan:  Rhinorrhea  Chronic clear rhinorrhea without other signs of acute infection. Prescribed Flonase nasal spray and discussed use of Neti pot. Recommended avoiding over the counter antihistamines due to risk of falls and sedation.  Hypertension Well controlled, BP in clinic 126/71. Continue lisinopril 40mg  and atenolol 50mg . Will re-check at next visit.  Diastolic Heart Failure Stable exertional capacity, chronic mild lower extremity edema being treated with compression stockings.  No recent hospitalizations.  We will continue with blood pressure control and furosemide 80 mg daily.   Kyphosis  Chronic  kyphosis associated with age. Discussed reasons for avoiding back braces, including limitations in mobility. Recommended continuing exercises from physical therapy and taking meloxicam 15 mg PRN. Cautioned against daily meloxicam use.  Hyperlipidemia History of CVA in 2014, no recent events. Lipid panel WNL on 11/05/19, no need to recheck today. Continue atorvastatin 80mg  and clopidogrel 75mg .  Osteoporosis No recent falls or fractures. Discontinued alendronate due to completion of 5 years on bisphosphonate therapy and difficulty remembering to take medication. Continue vitamin D3.

## 2020-09-23 NOTE — Progress Notes (Unsigned)
Things That May Be Affecting Your Health:  Alcohol  Hearing loss  Pain    Depression  Home Safety  Sexual Health   Diabetes x Lack of physical activity  Stress   Difficulty with daily activities  Loneliness  Tiredness   Drug use x Medicines  Tobacco use  x Falls  Motor Vehicle Safety  Weight   Food choices  Oral Health  Other    YOUR PERSONALIZED HEALTH PLAN : 1. Schedule your next subsequent Medicare Wellness visit in one year 2. Attend all of your regular appointments to address your medical issues 3. Complete the preventative screenings and services   Annual Wellness Visit   Medicare Covered Preventative Screenings and Services  Services & Screenings Men and Women Who How Often Need? Date of Last Service Action  Abdominal Aortic Aneurysm Adults with AAA risk factors Once      Alcohol Misuse and Counseling All Adults Screening once a year if no alcohol misuse. Counseling up to 4 face to face sessions.     Bone Density Measurement  Adults at risk for osteoporosis Once every 2 yrs      Lipid Panel Z13.6 All adults without CV disease Once every 5 yrs       Colorectal Cancer   Stool sample or  Colonoscopy All adults 50 and older   Once every year  Every 10 years        Depression All Adults Once a year  Today   Diabetes Screening Blood glucose, post glucose load, or GTT Z13.1  All adults at risk  Pre-diabetics  Once per year  Twice per year      Diabetes  Self-Management Training All adults Diabetics 10 hrs first year; 2 hours subsequent years. Requires Copay     Glaucoma  Diabetics  Family history of glaucoma  African Americans 50 yrs +  Hispanic Americans 65 yrs + Annually - requires coppay      Hepatitis C Z72.89 or F19.20  High Risk for HCV  Born between 1945 and 1965  Annually  Once      HIV Z11.4 All adults based on risk  Annually btw ages 5 & 35 regardless of risk  Annually > 65 yrs if at increased risk      Lung Cancer Screening  Asymptomatic adults aged 31-77 with 30 pack yr history and current smoker OR quit within the last 15 yrs Annually Must have counseling and shared decision making documentation before first screen      Medical Nutrition Therapy Adults with   Diabetes  Renal disease  Kidney transplant within past 3 yrs 3 hours first year; 2 hours subsequent years     Obesity and Counseling All adults Screening once a year Counseling if BMI 30 or higher  Today   Tobacco Use Counseling Adults who use tobacco  Up to 8 visits in one year     Vaccines Z23  Hepatitis B  Influenza   Pneumonia  Adults   Once  Once every flu season  Two different vaccines separated by one year     Next Annual Wellness Visit People with Medicare Every year  Today     Services & Screenings Women Who How Often Need  Date of Last Service Action  Mammogram  Z12.31 Women over 40 One baseline ages 51-39. Annually ager 40 yrs+      Pap tests All women Annually if high risk. Every 2 yrs for normal risk women  Screening for cervical cancer with   Pap (Z01.419 nl or Z01.411abnl) &  HPV Z11.51 Women aged 80 to 69 Once every 5 yrs     Screening pelvic and breast exams All women Annually if high risk. Every 2 yrs for normal risk women     Sexually Transmitted Diseases  Chlamydia  Gonorrhea  Syphilis All at risk adults Annually for non pregnant females at increased risk         Services & Screenings Men Who How Ofter Need  Date of Last Service Action  Prostate Cancer - DRE & PSA Men over 50 Annually.  DRE might require a copay.        Sexually Transmitted Diseases  Syphilis All at risk adults Annually for men at increased risk      Health Maintenance List Health Maintenance  Topic Date Due  . TETANUS/TDAP  03/31/2020  . INFLUENZA VACCINE  01/19/2021  . DEXA SCAN  Completed  . COVID-19 Vaccine  Completed  . Hepatitis C Screening  Completed  . PNA vac Low Risk Adult  Completed  . HPV VACCINES  Aged  Out   Please talk to her about the shingles vaccine, let me know if she wants and I will send an Rx

## 2020-10-01 ENCOUNTER — Ambulatory Visit (INDEPENDENT_AMBULATORY_CARE_PROVIDER_SITE_OTHER): Payer: Medicare Other | Admitting: Podiatry

## 2020-10-01 ENCOUNTER — Encounter: Payer: Self-pay | Admitting: Podiatry

## 2020-10-01 ENCOUNTER — Other Ambulatory Visit: Payer: Self-pay

## 2020-10-01 DIAGNOSIS — M79675 Pain in left toe(s): Secondary | ICD-10-CM | POA: Diagnosis not present

## 2020-10-01 DIAGNOSIS — B351 Tinea unguium: Secondary | ICD-10-CM | POA: Diagnosis not present

## 2020-10-01 DIAGNOSIS — M79674 Pain in right toe(s): Secondary | ICD-10-CM

## 2020-10-01 NOTE — Progress Notes (Signed)
  Subjective:  Patient ID: [name], female    DOB: June 14, 1941,  MRN: [id number]  Chief Complaint  Patient presents with  . Nail Problem    Nail trim    80 y.o. female returns for the above complaint.  Patient presents with thickened elongated dystrophic toenails x10.  Patient states mildly painful to touch.  Patient would like to have it debrided down.  She is not a diabetic.  She does have history of some peripheral neuropathy from unknown etiology.  She denies any other acute complaints  Objective:  There were no vitals filed for this visit. Podiatric Exam: Vascular: dorsalis pedis and posterior tibial pulses are palpable bilateral. Capillary return is immediate. Temperature gradient is WNL. Skin turgor WNL  Sensorium: Normal Semmes Weinstein monofilament test. Normal tactile sensation bilaterally. Nail Exam: Pt has thick disfigured discolored nails with subungual debris noted bilateral entire nail hallux through fifth toenails.  Pain on palpation to the nails. Ulcer Exam: There is no evidence of ulcer or pre-ulcerative changes or infection. Orthopedic Exam: Muscle tone and strength are WNL. No limitations in general ROM. No crepitus or effusions noted. HAV  B/L.  Hammer toes 2-5  B/L. Skin: No Porokeratosis. No infection or ulcers    Assessment & Plan:   1. Pain due to onychomycosis of toenails of both feet     Patient was evaluated and treated and all questions answered.  Onychomycosis with pain  -Nails palliatively debrided as below. -Educated on self-care  Procedure: Nail Debridement Rationale: pain  Type of Debridement: manual, sharp debridement. Instrumentation: Nail nipper, rotary burr. Number of Nails: 10  Procedures and Treatment: Consent by patient was obtained for treatment procedures. The patient understood the discussion of treatment and procedures well. All questions were answered thoroughly reviewed. Debridement of mycotic and hypertrophic toenails, 1  through 5 bilateral and clearing of subungual debris. No ulceration, no infection noted.  Return Visit-Office Procedure: Patient instructed to return to the office for a follow up visit 3 months for continued evaluation and treatment.  Nicholes Rough, DPM    No follow-ups on file.

## 2020-10-11 ENCOUNTER — Other Ambulatory Visit: Payer: Self-pay | Admitting: Student in an Organized Health Care Education/Training Program

## 2020-11-01 ENCOUNTER — Other Ambulatory Visit: Payer: Self-pay | Admitting: Student in an Organized Health Care Education/Training Program

## 2020-11-04 ENCOUNTER — Ambulatory Visit: Payer: Medicare Other | Admitting: Adult Health

## 2020-11-05 ENCOUNTER — Ambulatory Visit (INDEPENDENT_AMBULATORY_CARE_PROVIDER_SITE_OTHER): Payer: Medicare Other | Admitting: Adult Health

## 2020-11-05 ENCOUNTER — Encounter: Payer: Self-pay | Admitting: Adult Health

## 2020-11-05 VITALS — BP 152/76 | HR 78 | Ht 67.0 in | Wt 192.0 lb

## 2020-11-05 DIAGNOSIS — R202 Paresthesia of skin: Secondary | ICD-10-CM

## 2020-11-05 DIAGNOSIS — R200 Anesthesia of skin: Secondary | ICD-10-CM

## 2020-11-05 DIAGNOSIS — Z8673 Personal history of transient ischemic attack (TIA), and cerebral infarction without residual deficits: Secondary | ICD-10-CM

## 2020-11-05 NOTE — Progress Notes (Addendum)
PATIENT: [name] DOB: 1940-10-23  REASON FOR VISIT: follow up HISTORY FROM: patient  HISTORY OF PRESENT ILLNESS: Today 11/05/20:  [name] is a 80 year old female with a history of cerebrovascular disease and stroke.  She returns today for follow-up.  She has ongoing left hand numbness.  She states that it does not occur daily but several times a week.  Reports that it typically feels "heavy" in the middle of the day and numb occasionally she will have numbness on the top of her lip on the left side.  We did repeat MRI of the brain that did not show any recent strokes.  The patient did not want to undergo any further testing at that time.  Most likely her symptoms are a residual effect from her stroke.  She denies any new symptoms.  She returns today for an evaluation.  05/05/20: [name]  is a 80 year old female with a history of cerebrovascular disease and stroke.  She returns today for complaints of left hand numbness.  When the patient was seen in 2019 it was reported then that she continued to experience numbness in the left hand on occasion.  The patient reports that she has remained on Plavix.  Blood pressure remains in good control.  Vitamin D level was low but she recently started on supplements.  She states that in the last 3 months she has experienced left hand tingling and numbness off and on.  To her knowledge nothing that she does makes the tingling or numbness worse.  She does have a glove that she wears that she feels makes the symptoms better.  She denies any new symptoms such as weakness in the extremities.  No changes with her speech.  No injury to the hand or the neck.  The numbness and tingling affects the entire hand not certain digits.  HISTORY [name] is a 80 year old female with a history of cerebrovascular disease and stroke. She returns today for an evaluation. The patient's primary care provider manages her cholesterol and hypertension. Today her blood  pressure is good-it is 120/71. The patient is on atorvastatin for her cholesterol. Her recent lipid panel in March showed that her LDL was 80. The patient denies any new neurological symptoms. Denies any strokelike symptoms. She continues on Plavix for stroke prevention. She will occasionally have numbness in the left hand- this has been ongoing. The patient had carotid Doppler studies in February 2016 that were unremarkable. The patient reports that she does have some trouble with the right knee she has been seen orthopedic who has recommended knee replacement. She returns today for an evaluation.  REVIEW OF SYSTEMS: Out of a complete 14 system review of symptoms, the patient complains only of the following symptoms, and all other reviewed systems are negative.  See HPI  ALLERGIES: Allergies  Allergen Reactions  . Fluzone [Influenza Virus Vaccine] Other (See Comments)    Oculorespiratory syndrome with high dose Fluzone vaccine 2019  . Sulfonamide Derivatives Shortness Of Breath  . Penicillins Swelling and Other (See Comments)    REACTION: Local swelling in right arm after injection. Has patient had a PCN reaction causing immediate rash, facial/tongue/throat swelling, SOB or lightheadedness with hypotension:No Has patient had a PCN reaction causing severe rash involving mucus membranes or skin necrosis:No Has patient had a PCN reaction that required hospitalization:No Has patient had a PCN reaction occurring within the last 10 years:no If all of the above answers are "NO", then may proceed with Cephalosporin  use.     HOME MEDICATIONS: Outpatient Medications Prior to Visit  Medication Sig Dispense Refill  . cholecalciferol (VITAMIN D3) 25 MCG (1000 UNIT) tablet TAKE 1 TABLET BY MOUTH EVERY DAY 90 tablet 1  . atenolol (TENORMIN) 50 MG tablet TAKE 1 TABLET BY MOUTH EVERY DAY 90 tablet 1  . atorvastatin (LIPITOR) 80 MG tablet TAKE 1 TABLET BY MOUTH EVERY DAY AT 6PM 90 tablet 2  . clopidogrel  (PLAVIX) 75 MG tablet TAKE 1 TABLET BY MOUTH EVERY DAY 90 tablet 3  . fluticasone (FLONASE) 50 MCG/ACT nasal spray Place 1 spray into both nostrils daily. 16 g 2  . furosemide (LASIX) 40 MG tablet TAKE 2 TABLETS BY MOUTH EVERY DAY 180 tablet 1  . lisinopril (ZESTRIL) 20 MG tablet TAKE 1 TABLET BY MOUTH EVERY DAY 90 tablet 3  . meloxicam (MOBIC) 15 MG tablet TAKE 1 TABLET BY MOUTH EVERY DAY AS NEEDED FOR PAIN 30 tablet 2   No facility-administered medications prior to visit.    PAST MEDICAL HISTORY: Past Medical History:  Diagnosis Date  . Abnormal electromyogram (EMG)    Low grade right S1 radiculopathy 7/93.  . Diastolic dysfunction    a. 08/2012 Echo: EF 60-65%, Gr 2 DD, triv MR, PASP .  [name] Distal radius fracture, right 12/22/2015  . DJD (degenerative joint disease)   . Elevated alkaline phosphatase level    Bone scan 08/2004 showed no evidence of abnormal bony activity..  . Herniated lumbar intervertebral disc    S/P L4-5 & L5-S1 laminotomy, foraminotomy, and L5-S1 diskectomy by Dr. Trey Sailors 11/01/2000.  [name] Hyperlipemia   . Hypertension   . Iron deficiency anemia   . Osteopenia    A DEXA bone density scan done 02/13/2009 showed a left femur neck young adult T-score of -1.6 (osteopenia), a right femur neck young adult T-score of -1.6 (osteopenia), and an AP spine young adult T-score of 1.3 (normal).  Her FRAX score gave an estimated 10 year probability of 6.6% for major osteoporotic fracture and 0.8% for hip fracture.  . Peripheral neuropathy   . Rotator cuff syndrome of right shoulder    S/P right shoulder arthroscopic debridement of a massive rotator cuff tear, greater tuberosity and resection of subacromial spur by Dr. Gean Birchwood 07/07/2005.  . Stroke (HCC) 04/2013  . SVT (supraventricular tachycardia) (HCC)    PSVT  . Vitamin D deficiency     PAST SURGICAL HISTORY: Past Surgical History:  Procedure Laterality Date  . BACK SURGERY    . LAPAROSCOPIC CHOLECYSTECTOMY  1998  .  LUMBAR DISC SURGERY  11/01/2000    L4-5, L5-S1 laminotomy, foraminotomy and L5-S1 diskectomy  . SHOULDER ARTHROSCOPY  1/172007   S/P right shoulder arthroscopic debridement of a massive rotator cuff tear, greater tuberoplasty and resection of subacromial spur by Dr. Gean Birchwood on 07/07/2005.  [name] TOTAL ABDOMINAL HYSTERECTOMY  1992    FAMILY HISTORY: Family History  Problem Relation Age of Onset  . Ovarian cancer Mother 20  . Hypertension Mother   . Heart attack Mother 22  . Cancer Brother   . Hypertension Sister   . Hypertension Sister   . Neuropathy Sister   . Breast cancer Neg Hx   . Colon cancer Neg Hx   . Lung cancer Neg Hx     SOCIAL HISTORY: Social History   Socioeconomic History  . Marital status: Single    Spouse name: Not on file  . Number of children: 6  . Years of education:  121th  . Highest education level: Not on file  Occupational History  . Occupation: retired  Tobacco Use  . Smoking status: Former Smoker    Packs/day: 0.15    Years: 2.00    Pack years: 0.30    Types: Cigarettes    Quit date: 10/07/1999    Years since quitting: 21.0  . Smokeless tobacco: Never Used  Substance and Sexual Activity  . Alcohol use: No    Alcohol/week: 0.0 standard drinks  . Drug use: No  . Sexual activity: Not Currently  Other Topics Concern  . Not on file  Social History Narrative   Patient is single and lives at home alone.    Disabled.   Education high school.   Right handed.   Caffeine sometimes one cup of coffee.   Social Determinants of Health   Financial Resource Strain: Not on file  Food Insecurity: Not on file  Transportation Needs: Not on file  Physical Activity: Not on file  Stress: Not on file  Social Connections: Not on file  Intimate Partner Violence: Not on file      PHYSICAL EXAM  Vitals:   11/05/20 1127  BP: (!) 152/76  Pulse: 78  Weight: 192 lb (87.1 kg)  Height: 5\' 7"  (1.702 m)   Body mass index is 30.07 kg/m.  Generalized:  Well developed, in no acute distress   Neurological examination  Mentation: Alert oriented to time, place, history taking. Follows all commands speech and language fluent Cranial nerve II-XII: Pupils were equal round reactive to light. Extraocular movements were full, visual field were full on confrontational test.  Head turning and shoulder shrug  were normal and symmetric. Motor: The motor testing reveals 5 over 5 strength of all 4 extremities. Good symmetric motor tone is noted throughout.  Sensory: Sensory testing is intact to soft touch on all 4 extremities. No evidence of extinction is noted.  Coordination: Cerebellar testing reveals good finger-nose-finger and heel-to-shin bilaterally.  Gait and station: Gait is unsteady. Using a cane with ambulating Reflexes: Deep tendon reflexes are symmetric and normal bilaterally.   DIAGNOSTIC DATA (LABS, IMAGING, TESTING) - I reviewed patient records, labs, notes, testing and imaging myself where available.  Lab Results  Component Value Date   WBC 4.6 10/11/2016   HGB 11.7 10/11/2016   HCT 35.4 10/11/2016   MCV 85 10/11/2016   PLT 157 10/11/2016      Component Value Date/Time   NA 144 05/05/2020 1531   K 4.0 05/05/2020 1531   CL 106 05/05/2020 1531   CO2 27 05/05/2020 1531   GLUCOSE 85 05/05/2020 1531   GLUCOSE 106 (H) 12/25/2015 0350   BUN 17 05/05/2020 1531   CREATININE 1.07 (H) 05/05/2020 1531   CREATININE 0.66 08/22/2014 0925   CALCIUM 9.9 05/05/2020 1531   PROT 6.6 05/05/2020 1531   ALBUMIN 4.5 05/05/2020 1531   AST 15 05/05/2020 1531   ALT 10 05/05/2020 1531   ALKPHOS 100 05/05/2020 1531   BILITOT 0.8 05/05/2020 1531   GFRNONAA 50 (L) 05/05/2020 1531   GFRNONAA 88 08/22/2014 0925   GFRAA 57 (L) 05/05/2020 1531   GFRAA >89 08/22/2014 0925   Lab Results  Component Value Date   CHOL 167 11/05/2019   HDL 60 11/05/2019   LDLCALC 97 11/05/2019   TRIG 46 11/05/2019   CHOLHDL 2.8 11/05/2019   Lab Results  Component  Value Date   HGBA1C 5.0 05/05/2020   Lab Results  Component Value Date   VITAMINB12  1,149 (H) 09/28/2015   Lab Results  Component Value Date   TSH 2.260 09/05/2017      ASSESSMENT AND PLAN 80 y.o. year old female  has a past medical history of Abnormal electromyogram (EMG), Diastolic dysfunction, Distal radius fracture, right (12/22/2015), DJD (degenerative joint disease), Elevated alkaline phosphatase level, Herniated lumbar intervertebral disc, Hyperlipemia, Hypertension, Iron deficiency anemia, Osteopenia, Peripheral neuropathy, Rotator cuff syndrome of right shoulder, Stroke (HCC) (04/2013), SVT (supraventricular tachycardia) (HCC), and Vitamin D deficiency. here with:  1. Hx of stroke  2. Left hand numbness/tingling  -Patient symptoms have remained stable. -Encouraged her to keep strict control of her blood pressure with goal less than 130/90, LDL less than 70 and hemoglobin A1c less than 6.5%. -She remains on Plavix -Follow-up in 1 year or sooner if needed  I spent 25 minutes of face-to-face and non-face-to-face time with patient.  This included previsit chart review, lab review, study review, order entry, electronic health record documentation, patient education.  Butch Penny, MSN, NP-C 11/05/2020, 11:26 AM [facility] [address] [place], [place] [address] [phone]  agree with assessment and plan as stated.     Naomie Dean, MD [facility]

## 2020-11-05 NOTE — Patient Instructions (Signed)
Your Plan:  Continue to monitor symptoms Keep BP <130/90 Cholesterol LDL <70 HgbA1c <6.5% If your symptoms worsen or you develop new symptoms please let us know.    Thank you for coming to see [place] at [facility]. I hope we have been able to provide you high quality care today.  You may receive a patient satisfaction survey over the next few weeks. We would appreciate your feedback and comments so that we may continue to improve ourselves and the health of our patients.

## 2021-01-08 ENCOUNTER — Other Ambulatory Visit: Payer: Self-pay | Admitting: Student in an Organized Health Care Education/Training Program

## 2021-01-08 DIAGNOSIS — M1712 Unilateral primary osteoarthritis, left knee: Secondary | ICD-10-CM | POA: Diagnosis not present

## 2021-01-08 DIAGNOSIS — M1711 Unilateral primary osteoarthritis, right knee: Secondary | ICD-10-CM | POA: Diagnosis not present

## 2021-01-14 ENCOUNTER — Other Ambulatory Visit: Payer: Self-pay

## 2021-01-14 ENCOUNTER — Ambulatory Visit (INDEPENDENT_AMBULATORY_CARE_PROVIDER_SITE_OTHER): Payer: Medicare Other | Admitting: Podiatry

## 2021-01-14 ENCOUNTER — Encounter: Payer: Self-pay | Admitting: Podiatry

## 2021-01-14 DIAGNOSIS — L84 Corns and callosities: Secondary | ICD-10-CM

## 2021-01-14 DIAGNOSIS — M79674 Pain in right toe(s): Secondary | ICD-10-CM

## 2021-01-14 DIAGNOSIS — M79675 Pain in left toe(s): Secondary | ICD-10-CM

## 2021-01-14 DIAGNOSIS — B351 Tinea unguium: Secondary | ICD-10-CM

## 2021-01-14 DIAGNOSIS — M79671 Pain in right foot: Secondary | ICD-10-CM | POA: Diagnosis not present

## 2021-01-14 DIAGNOSIS — M79672 Pain in left foot: Secondary | ICD-10-CM

## 2021-01-17 NOTE — Progress Notes (Signed)
Subjective: [name] is a pleasant 80 y.o. female patient seen today with cc of painful corns b/l feet and  painful thick toenails that are difficult to trim. Pain interferes with ambulation. Aggravating factors include wearing enclosed shoe gear. Pain is relieved with periodic professional debridement.  PCP is Tyson Alias, MD.  Allergies  Allergen Reactions   Fluzone [place] Virus Vaccine] Other (See Comments)    Oculorespiratory syndrome with high dose Fluzone vaccine 2019   Sulfonamide Derivatives Shortness Of Breath   Penicillins Swelling and Other (See Comments)    REACTION: Local swelling in right arm after injection. Has patient had a PCN reaction causing immediate rash, facial/tongue/throat swelling, SOB or lightheadedness with hypotension:No Has patient had a PCN reaction causing severe rash involving mucus membranes or skin necrosis:No Has patient had a PCN reaction that required hospitalization:No Has patient had a PCN reaction occurring within the last 10 years:no If all of the above answers are "NO", then may proceed with Cephalosporin use.     Objective: Physical Exam  General: [name] is a pleasant 80 y.o. African American female, in NAD. AAO x 3.   Vascular:  Capillary fill time to digits <3 seconds b/l lower extremities. Palpable DP pulse(s) b/l lower extremities Faintly palpable PT pulse(s) b/l lower extremities. Pedal hair absent. Lower extremity skin temperature gradient within normal limits.  Dermatological:  Pedal skin is thin shiny, atrophic b/l lower extremities. No open wounds b/l lower extremities. No interdigital macerations b/l lower extremities. Toenails 2-5 bilaterally and L hallux elongated, discolored, dystrophic, thickened, and crumbly with subungual debris and tenderness to dorsal palpation. Anonychia noted R hallux. Nailbed(s) epithelialized.   Musculoskeletal:  Normal muscle strength 5/5 to all lower extremity muscle  groups bilaterally. Hallux valgus with bunion deformity noted b/l lower extremities. Hammertoe(s) noted to the 2-5 bilaterally.  Neurological:  Protective sensation intact 5/5 intact bilaterally with 10g monofilament b/l.  Assessment and Plan:  1. Pain due to onychomycosis of toenails of both feet   2. Corns   3. Pain in both feet   -Examined patient. -Patient to continue soft, supportive shoe gear daily. -Toenails 1-5 b/l were debrided in length and girth with sterile nail nippers and dremel without iatrogenic bleeding.  -Corn(s) L 2nd toe, L 3rd toe, and R 2nd toe were pared utilizing sterile scalpel blade without incident. Total number debrided =3. -Patient to report any pedal injuries to medical professional immediately. -Patient/POA to call should there be question/concern in the interim.  Return in about 3 months (around 04/16/2021).  Freddie Breech, DPM

## 2021-01-19 ENCOUNTER — Other Ambulatory Visit: Payer: Self-pay | Admitting: Student in an Organized Health Care Education/Training Program

## 2021-02-06 ENCOUNTER — Ambulatory Visit (INDEPENDENT_AMBULATORY_CARE_PROVIDER_SITE_OTHER): Payer: Medicare Other | Admitting: Podiatry

## 2021-02-06 ENCOUNTER — Telehealth: Payer: Self-pay | Admitting: *Deleted

## 2021-02-06 ENCOUNTER — Other Ambulatory Visit: Payer: Self-pay

## 2021-02-06 DIAGNOSIS — M205X1 Other deformities of toe(s) (acquired), right foot: Secondary | ICD-10-CM | POA: Diagnosis not present

## 2021-02-06 DIAGNOSIS — M79674 Pain in right toe(s): Secondary | ICD-10-CM

## 2021-02-06 NOTE — Progress Notes (Signed)
Subjective: [name] is a pleasant 80 y.o. female patient seen today with chief concern of painful right 2nd toe. She states it is painful with every step despite use of leather toe crest. She denies any redness, drainage or swelling of digit. She has same discomfort of left 3rd digit, but right 2nd toe is more symptomatic.  She has a new pair of Occupational [employer] from [employer].  PCP is Tyson Alias, MD. Last visit was: 09/22/2020.  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 Has patient had a PCN reaction causing immediate rash, facial/tongue/throat swelling, SOB or lightheadedness with hypotension:No Has patient had a PCN reaction causing severe rash involving mucus membranes or skin necrosis:No Has patient had a PCN reaction that required hospitalization:No Has patient had a PCN reaction occurring within the last 10 years:no If all of the above answers are "NO", then may proceed with Cephalosporin use.     Objective: Physical Exam  General: [name] is a pleasant 80 y.o. African American female, in NAD. AAO x 3.   Vascular:  Capillary fill time to digits <3 seconds b/l lower extremities. Palpable DP pulse(s) b/l lower extremities Faintly palpable PT pulse(s) b/l lower extremities. Pedal hair absent. Lower extremity skin temperature gradient within normal limits.  Dermatological:  Pedal skin is thin shiny, atrophic b/l lower extremities. No open wounds b/l lower extremities. No interdigital macerations b/l lower extremities. Toenails recently debrided. Anonychia noted R hallux. Nailbed(s) epithelialized.   Painful lesion distal tip right 2nd digit with tenderness to palpation. No erythema, no edema,  no drainage, no fluctuance.  Musculoskeletal:  Normal muscle strength 5/5 to all lower extremity muscle groups bilaterally. Hallux valgus with bunion deformity noted b/l lower extremities. Clawtoe deformity L 3rd toe and R 2nd toe.  Neurological:  Protective sensation intact 5/5 intact bilaterally with 10g monofilament b/l.  Assessment and Plan:  1. Pain in toe of right foot   2. Claw toe, acquired, right   -Examined patient. -Patient to continue soft, supportive shoe gear daily. -Patient to report any pedal injuries to medical professional immediately. -Patient referred for Dr.Kevin Pate for surgical consultation regarding evaluation for flexor tenotomy of right 2nd digit DIPJ. -Light paring of right 2nd digit. Applied fabric dot band-aid. Dispensed silicone toe cap for right 2nd digit. She is to use in conjuction with toe crest. Apply every morning. Remove every evening. Patient noted relief of pain symptoms after today's treatment. -Patient/POA to call should there be question/concern in the interim.  Return for Flexor Tenotomy with Dr. Allena Katz Right 2nd toe.  [name], DPM

## 2021-02-06 NOTE — Telephone Encounter (Signed)
Patient is calling,no answer, left vmessage  about her appointment for today.scheduled 10:45 w/ Dr Eloy End.

## 2021-02-08 ENCOUNTER — Encounter: Payer: Self-pay | Admitting: Podiatry

## 2021-02-18 ENCOUNTER — Other Ambulatory Visit: Payer: Self-pay

## 2021-02-18 ENCOUNTER — Ambulatory Visit (INDEPENDENT_AMBULATORY_CARE_PROVIDER_SITE_OTHER): Payer: Medicare Other | Admitting: Podiatry

## 2021-02-18 DIAGNOSIS — M205X1 Other deformities of toe(s) (acquired), right foot: Secondary | ICD-10-CM | POA: Diagnosis not present

## 2021-02-19 ENCOUNTER — Encounter: Payer: Self-pay | Admitting: Podiatry

## 2021-02-19 NOTE — Progress Notes (Signed)
  Subjective:  Patient ID: [name], female    DOB: [birth year],  MRN: [id number]  Chief Complaint  Patient presents with   Toe Pain    Right foot 2nd toe     80 y.o. female returns today for planned flexor tenotomy of the second digit right digit.  Objective:  There were no vitals filed for this visit.  General AA&O x3. Normal mood and affect.  Vascular Pedal pulses palpable.  Neurologic Epicritic sensation grossly intact.  Dermatologic Pre-ulcerative callus at the tip of the right, 2nd toe  Orthopedic: Semi-reducible hammertoe deformity right, 2nd toe    Assessment & Plan:  Patient was evaluated and treated and all questions answered.  Hammertoe second right with pre-ulcerative callus -Flexor tenotomy as below. -Advised to remove the dressing in 24 hours and apply a band-aid and triple abx ointment every day thereafter.  Procedure: Flexor Tenotomy Indication for Procedure: toe with semi-reducible hammertoe with distal tip ulceration. Flexor tenotomy indicated to alleviate contracture, reduce pressure, and enhance healing of the ulceration. Location: right Anesthesia: Lidocaine 1% plain; 1.5 mL and Marcaine 0.5% plain; 1.5 mL digital block Instrumentation: 11 blade Technique: The toe was anesthetized as above and prepped in the usual fashion. The toe was exsanquinated and a tourniquet was secured at the base of the toe. An 11 blade was then used to percutaneously release the flexor tendon at the plantar surface of the toe with noted release of the hammertoe deformity.  The incision was closed with 3-0 Prolene.  The incision was then dressed with antibiotic ointment and band-aid. Compression splint dressing applied. Patient tolerated the procedure well. Dressing: Dry, sterile, compression dressing. Disposition: Patient tolerated procedure well. Patient to return in 1 week for follow-up.      No follow-ups on file.

## 2021-03-04 ENCOUNTER — Ambulatory Visit: Payer: Medicare Other | Admitting: Podiatry

## 2021-03-06 ENCOUNTER — Ambulatory Visit (INDEPENDENT_AMBULATORY_CARE_PROVIDER_SITE_OTHER): Payer: Medicare Other | Admitting: Podiatry

## 2021-03-06 ENCOUNTER — Other Ambulatory Visit: Payer: Self-pay

## 2021-03-06 DIAGNOSIS — M205X1 Other deformities of toe(s) (acquired), right foot: Secondary | ICD-10-CM

## 2021-03-06 NOTE — Progress Notes (Signed)
  Subjective:  Patient ID: [name], female    DOB: 06-Sep-1940,  MRN: [id number]  Chief Complaint  Patient presents with   Toe Pain    Right toe contracture follow up  PT stated that she is doing well denies any pain at this time    80 y.o. female returns today for follow-up after undergoing a flexor tenotomy of the second digit.  She states she is doing a lot better.  The stitches are intact.  Objective:  There were no vitals filed for this visit.  General AA&O x3. Normal mood and affect.  Vascular Pedal pulses palpable.  Neurologic Epicritic sensation grossly intact.  Dermatologic No further pre-ulcerative callus at the tip of the right, 2nd toe  Orthopedic: No further semi-reducible hammertoe deformity right, 2nd toe    Assessment & Plan:  Patient was evaluated and treated and all questions answered.  Hammertoe second right with pre-ulcerative callus -Clinically it was flexor tenotomy.  Good correction alignment noted.  No further pressure noted to the distal tip of the second digit.  I discussed shoe gear modification offloading at this time.  The stitch was removed.  Patient states understanding.  If any foot and ankle issues arise in future of asked her to come see me.      No follow-ups on file.

## 2021-03-08 ENCOUNTER — Other Ambulatory Visit: Payer: Self-pay | Admitting: Student in an Organized Health Care Education/Training Program

## 2021-03-30 ENCOUNTER — Encounter: Payer: Self-pay | Admitting: Student in an Organized Health Care Education/Training Program

## 2021-03-30 ENCOUNTER — Ambulatory Visit (INDEPENDENT_AMBULATORY_CARE_PROVIDER_SITE_OTHER): Payer: Medicare Other | Admitting: Student in an Organized Health Care Education/Training Program

## 2021-03-30 VITALS — BP 119/63 | HR 67 | Temp 98.6°F | Ht 67.0 in | Wt 185.0 lb

## 2021-03-30 DIAGNOSIS — I998 Other disorder of circulatory system: Secondary | ICD-10-CM | POA: Diagnosis not present

## 2021-03-30 DIAGNOSIS — Z23 Encounter for immunization: Secondary | ICD-10-CM | POA: Diagnosis not present

## 2021-03-30 DIAGNOSIS — I5032 Chronic diastolic (congestive) heart failure: Secondary | ICD-10-CM

## 2021-03-30 DIAGNOSIS — E7800 Pure hypercholesterolemia, unspecified: Secondary | ICD-10-CM | POA: Diagnosis not present

## 2021-03-30 DIAGNOSIS — R739 Hyperglycemia, unspecified: Secondary | ICD-10-CM | POA: Diagnosis not present

## 2021-03-30 DIAGNOSIS — R600 Localized edema: Secondary | ICD-10-CM

## 2021-03-30 DIAGNOSIS — I10 Essential (primary) hypertension: Secondary | ICD-10-CM

## 2021-03-30 LAB — POCT GLYCOSYLATED HEMOGLOBIN (HGB A1C): Hemoglobin A1C: 5 % (ref 4.0–5.6)

## 2021-03-30 LAB — GLUCOSE, CAPILLARY: Glucose-Capillary: 87 mg/dL (ref 70–99)

## 2021-03-30 NOTE — Assessment & Plan Note (Signed)
Hyperlipidemia with a history of ischemic events, CVA in 2017 and recent MRI with multiple chronic small vessel disease in the brain.  Plan to continue with atorvastatin 80 mg daily and Plavix 75 mg daily for secondary prevention.

## 2021-03-30 NOTE — Assessment & Plan Note (Signed)
Weight 185 pounds with BMI of 28.  History of hyperglycemia and ischemic vascular events..  We will check an A1c today and annually.

## 2021-03-30 NOTE — Assessment & Plan Note (Signed)
Chronic HFpEF, good functional status at home, NYHA class II symptoms, no recent hospitalizations.  Plan to continue with chronic Lasix for volume management.  Appears euvolemic today.  Continuing with blood pressure control and management of her ischemic vascular disease as well.

## 2021-03-30 NOTE — Assessment & Plan Note (Signed)
Chronic lower extremity edema bilaterally is multifactorial, due to obesity, heart failure with preserved ejection fraction, and bilateral knee osteoarthritis.  Doing okay today, will plan to continue with compression stockings.  Having some problems with tight fit, will remeasure her legs today to ensure she is getting the right side stockings.  Encouraged wearing stockings for 8 hours during the day, take them off in the evening.  Continue with furosemide carefully.  Check BMP today.

## 2021-03-30 NOTE — Assessment & Plan Note (Signed)
Blood pressure well controlled today.  Plan to continue with lisinopril 20 mg and atenolol 50 mg daily.  Will check BMP today.

## 2021-03-30 NOTE — Progress Notes (Signed)
   Assessment and Plan:  See Encounters tab for problem-based medical decision making.   __________________________________________________________  HPI:   80 year old person here for follow-up of hypertension.  Patient doing well, lives independently at home.  Gets around with the use of an upright walker.  Suffers from osteoarthritis in her knees and low back which does limit her ambulation at times.  Uses meloxicam on the bad days, maybe once a week.  No recent falls.  Has a daughter that helps her with grocery shopping.  Uses a cane to walk outside of the house.  No recent chest pain or shortness of breath.  No recent hospitalizations or emergency department visits.  Good adherence with her medications without adverse side effects.  Last labs were 1 year ago.  __________________________________________________________  Problem List: Patient Active Problem List   Diagnosis Date Noted   Osteoporosis 04/03/2009    Priority: 1.   Bilateral leg edema due to diastolic dysfunction 02/22/2007    Priority: 1.   Ischemic vascular disease 11/20/2018    Priority: 2.   Chronic diastolic congestive heart failure (HCC) 09/28/2015    Priority: 2.   Risk for falls 03/03/2015    Priority: 2.   Essential hypertension 09/14/2006    Priority: 2.   Hyperlipidemia 04/27/2006    Priority: 2.   Osteoarthritis 04/27/2006    Priority: 2.   Lower back pain 04/25/2015    Priority: 3.   Healthcare maintenance 04/08/2014    Priority: 3.   Hyperglycemia 06/01/2012    Priority: 3.   Vitamin D deficiency 05/08/2010    Priority: 3.   Kyphosis 02/18/2020   Peripheral neuropathy 10/28/2009    Medications: Reconciled today in Epic __________________________________________________________  Physical Exam:  Vital Signs: Vitals:   03/30/21 1007  BP: 119/63  Pulse: 67  Temp: 98.6 F (37 C)  TempSrc: Oral  SpO2: 100%  Weight: 185 lb (83.9 kg)  Height: 5\' 7"  (1.702 m)    Gen: Well appearing,  NAD CV: RRR, no murmurs Pulm: Normal effort, no wheezing Abd: Soft, NT, ND Ext: Warm, 2+ chronic lower extremity edema bilaterally, mix of pitting and nonpitting, osteoarthritis in bilateral knees and hands.

## 2021-03-31 ENCOUNTER — Encounter: Payer: Self-pay | Admitting: Student in an Organized Health Care Education/Training Program

## 2021-03-31 LAB — BMP8+ANION GAP
Anion Gap: 15 mmol/L (ref 10.0–18.0)
BUN/Creatinine Ratio: 16 (ref 12–28)
BUN: 18 mg/dL (ref 8–27)
CO2: 24 mmol/L (ref 20–29)
Calcium: 9.6 mg/dL (ref 8.7–10.3)
Chloride: 103 mmol/L (ref 96–106)
Creatinine, Ser: 1.12 mg/dL — ABNORMAL HIGH (ref 0.57–1.00)
Glucose: 86 mg/dL (ref 70–99)
Potassium: 3.9 mmol/L (ref 3.5–5.2)
Sodium: 142 mmol/L (ref 134–144)
eGFR: 50 mL/min/{1.73_m2} — ABNORMAL LOW (ref >59)

## 2021-03-31 LAB — LIPID PANEL
Chol/HDL Ratio: 2.7 ratio (ref 0.0–4.4)
Cholesterol, Total: 161 mg/dL (ref 100–199)
HDL: 59 mg/dL (ref >39)
LDL Chol Calc (NIH): 91 mg/dL (ref 0–99)
Triglycerides: 52 mg/dL (ref 0–149)
VLDL Cholesterol Cal: 11 mg/dL (ref 5–40)

## 2021-04-06 ENCOUNTER — Encounter: Payer: Self-pay | Admitting: Podiatry

## 2021-04-06 ENCOUNTER — Other Ambulatory Visit: Payer: Self-pay

## 2021-04-06 ENCOUNTER — Ambulatory Visit (INDEPENDENT_AMBULATORY_CARE_PROVIDER_SITE_OTHER): Payer: Medicare Other | Admitting: Podiatry

## 2021-04-06 DIAGNOSIS — B351 Tinea unguium: Secondary | ICD-10-CM

## 2021-04-06 DIAGNOSIS — M79675 Pain in left toe(s): Secondary | ICD-10-CM | POA: Diagnosis not present

## 2021-04-06 DIAGNOSIS — M79674 Pain in right toe(s): Secondary | ICD-10-CM | POA: Diagnosis not present

## 2021-04-11 NOTE — Progress Notes (Signed)
  Subjective:  Patient ID: [name], female    DOB: 02-10-1941,  MRN: [id number]  [name] presents to clinic today for thick, elongated toenails b/l feet which are tender when wearing enclosed shoe gear. Symptoms resolve with periodic professional debridement.  She states her right 2nd toe continues to do well. She had an in-office flexor tenotomy performed by Dr. Nicholes Rough in August. She is pleased with the results.   Ms. Shill voices no new pedal problems on today's visit.  PCP is Tyson Alias, MD , and last visit was 03/30/2021.  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 Has patient had a PCN reaction causing immediate rash, facial/tongue/throat swelling, SOB or lightheadedness with hypotension:No Has patient had a PCN reaction causing severe rash involving mucus membranes or skin necrosis:No Has patient had a PCN reaction that required hospitalization:No Has patient had a PCN reaction occurring within the last 10 years:no If all of the above answers are "NO", then may proceed with Cephalosporin use.     Review of Systems: Negative except as noted in the HPI. Objective:   Constitutional DEBRA COLON is a pleasant 80 y.o. African American female, in NAD. AAO x 3.   Vascular Capillary fill time to digits <3 seconds b/l lower extremities. Palpable DP pulse(s) b/l lower extremities Faintly palpable PT pulse(s) b/l lower extremities. Pedal hair absent. Lower extremity skin temperature gradient within normal limits. No pain with calf compression b/l. No cyanosis or clubbing noted.  Neurologic Normal speech. Oriented to person, place, and time. Protective sensation intact 5/5 intact bilaterally with 10g  monofilament b/l.  Dermatologic Pedal skin thin and atrophic b/l LE. No open wounds b/l LE. No interdigital macerations b/l lower extremities. Toenails 2-5 bilaterally and L hallux elongated, discolored, dystrophic, thickened, and crumbly with subungual debris and tenderness to dorsal palpation. Anonychia noted R hallux. Nailbed(s) epithelialized.   Orthopedic: Normal muscle strength 5/5 to all lower extremity muscle groups bilaterally. Hallux valgus with bunion deformity noted b/l lower extremities. Hammertoe(s) noted to the 2-5 bilaterally.   Radiographs: None Assessment:   1. Pain due to onychomycosis of toenails of both feet    Plan:  Patient was evaluated and treated and all questions answered. Consent given for treatment as described below: -Toenails 2-5 bilaterally and L hallux debrided in length and girth without iatrogenic bleeding with sterile nail nipper and dremel.  -Patient to report any pedal injuries to medical professional immediately. -Patient/POA to call should there be question/concern in the interim.  Return in about 3 months (around 07/07/2021).  Freddie Breech, DPM

## 2021-04-15 ENCOUNTER — Other Ambulatory Visit: Payer: Self-pay | Admitting: Student in an Organized Health Care Education/Training Program

## 2021-04-15 DIAGNOSIS — M1711 Unilateral primary osteoarthritis, right knee: Secondary | ICD-10-CM | POA: Diagnosis not present

## 2021-04-15 DIAGNOSIS — M1712 Unilateral primary osteoarthritis, left knee: Secondary | ICD-10-CM | POA: Diagnosis not present

## 2021-06-07 ENCOUNTER — Other Ambulatory Visit: Payer: Self-pay | Admitting: Student in an Organized Health Care Education/Training Program

## 2021-06-29 ENCOUNTER — Encounter: Payer: Self-pay | Admitting: Pharmacist

## 2021-06-29 ENCOUNTER — Ambulatory Visit (INDEPENDENT_AMBULATORY_CARE_PROVIDER_SITE_OTHER): Payer: Medicare Other | Admitting: Pharmacist

## 2021-06-29 VITALS — Wt 184.0 lb

## 2021-06-29 DIAGNOSIS — Z0000 Encounter for general adult medical examination without abnormal findings: Secondary | ICD-10-CM | POA: Diagnosis not present

## 2021-06-29 NOTE — Patient Instructions (Addendum)
Annual Wellness Visit   Medicare Covered Preventative Screenings and Services  Services & Screenings Men and Women Who How Often Need? Date of Last Service Action  Abdominal Aortic Aneurysm Adults with AAA risk factors Once     Alcohol Misuse and Counseling All Adults Screening once a year if no alcohol misuse. Counseling up to 4 face to face sessions.     Bone Density Measurement  Adults at risk for osteoporosis Once every 2 yrs     Lipid Panel Z13.6 All adults without CV disease Once every 5 yrs     Colorectal Cancer  Stool sample or Colonoscopy All adults 75 and older  Once every year Every 10 years     Depression All Adults Once a year  Today   Diabetes Screening Blood glucose, post glucose load, or GTT Z13.1 All adults at risk Pre-diabetics Once per year Twice per year     Diabetes  Self-Management Training All adults Diabetics 10 hrs first year; 2 hours subsequent years. Requires Copay     Glaucoma Diabetics Family history of glaucoma African Americans 59 yrs + Hispanic Americans 48 yrs + Annually - requires coppay     Hepatitis C Z72.89 or F19.20 High Risk for HCV Born between 1945 and 1965 Annually Once     HIV Z11.4 All adults based on risk Annually btw ages 33 & 36 regardless of risk Annually > 65 yrs if at increased risk     Lung Cancer Screening Asymptomatic adults aged 58-77 with 30 pack yr history and current smoker OR quit within the last 15 yrs Annually Must have counseling and shared decision making documentation before first screen     Medical Nutrition Therapy Adults with  Diabetes Renal disease Kidney transplant within past 3 yrs 3 hours first year; 2 hours subsequent years     Obesity and Counseling All adults Screening once a year Counseling if BMI 30 or higher  Today   Tobacco Use Counseling Adults who use tobacco  Up to 8 visits in one year     Vaccines Z23 Hepatitis B Influenza  Pneumonia  Adults  Once Once every flu season Two different  vaccines separated by one year     Next Annual Wellness Visit People with Medicare Every year  Today     Services & Screenings Women Who How Often Need  Date of Last Service Action  Mammogram  Z12.31 Women over 13 One baseline ages 78-39. Annually ager 40 yrs+     Pap tests All women Annually if high risk. Every 2 yrs for normal risk women     Screening for cervical cancer with  Pap (Z01.419 nl or Z01.411abnl) & HPV Z11.51 Women aged 40 to 37 Once every 5 yrs     Screening pelvic and breast exams All women Annually if high risk. Every 2 yrs for normal risk women     Sexually Transmitted Diseases Chlamydia Gonorrhea Syphilis All at risk adults Annually for non pregnant females at increased risk         [place] Men Who How Ofter Need  Date of Last Service Action  Prostate Cancer - DRE & PSA Men over 50 Annually.  DRE might require a copay.     Sexually Transmitted Diseases Syphilis All at risk adults Annually for men at increased risk         Things That May Be Affecting Your Health:  Alcohol  Hearing loss  Pain    Depression  Home  Safety  Sexual Health   Diabetes X Lack of physical activity  Stress   Difficulty with daily activities  Loneliness  Tiredness   Drug use  Medicines  Tobacco use   Falls  Motor Vehicle Safety X Weight   Food choices  Oral Health  Other    YOUR PERSONALIZED HEALTH PLAN : 1. Schedule your next subsequent Medicare Wellness visit in one year 2. Attend all of your regular appointments to address your medical issues 3. Complete the preventative screenings and services 4. We recommend you obtain your Shingles, Tetanus, and Covid booster vaccines. If you obtain any vaccines outside of your Primary Care Provider's office please let us know so we may update your records.  Fall Prevention in the Home, Adult Falls can cause injuries and can happen to people of all ages. There are many things you can do to make your home safe and to help  prevent falls. Ask for help when making these changes. What actions can I take to prevent falls? General Instructions Use good lighting in all rooms. Replace any light bulbs that burn out. Turn on the lights in dark areas. Use night-lights. Keep items that you use often in easy-to-reach places. Lower the shelves around your home if needed. Set up your furniture so you have a clear path. Avoid moving your furniture around. Do not have throw rugs or other things on the floor that can make you trip. Avoid walking on wet floors. If any of your floors are uneven, fix them. Add color or contrast paint or tape to clearly mark and help you see: Grab bars or handrails. First and last steps of staircases. Where the edge of each step is. If you use a stepladder: Make sure that it is fully opened. Do not climb a closed stepladder. Make sure the sides of the stepladder are locked in place. Ask someone to hold the stepladder while you use it. Know where your pets are when moving through your home. What can I do in the bathroom?   Keep the floor dry. Clean up any water on the floor right away. Remove soap buildup in the tub or shower. Use nonskid mats or decals on the floor of the tub or shower. Attach bath mats securely with double-sided, nonslip rug tape. If you need to sit down in the shower, use a plastic, nonslip stool. Install grab bars by the toilet and in the tub and shower. Do not use towel bars as grab bars. What can I do in the bedroom? Make sure that you have a light by your bed that is easy to reach. Do not use any sheets or blankets for your bed that hang to the floor. Have a firm chair with side arms that you can use for support when you get dressed. What can I do in the kitchen? Clean up any spills right away. If you need to reach something above you, use a step stool with a grab bar. Keep electrical cords out of the way. Do not use floor polish or wax that makes floors  slippery. What can I do with my stairs? Do not leave any items on the stairs. Make sure that you have a light switch at the top and the bottom of the stairs. Make sure that there are handrails on both sides of the stairs. Fix handrails that are broken or loose. Install nonslip stair treads on all your stairs. Avoid having throw rugs at the top or bottom of the stairs.  Choose a carpet that does not hide the edge of the steps on the stairs. Check carpeting to make sure that it is firmly attached to the stairs. Fix carpet that is loose or worn. What can I do on the outside of my home? Use bright outdoor lighting. Fix the edges of walkways and driveways and fix any cracks. Remove anything that might make you trip as you walk through a door, such as a raised step or threshold. Trim any bushes or trees on paths to your home. Check to see if handrails are loose or broken and that both sides of all steps have handrails. Install guardrails along the edges of any raised decks and porches. Clear paths of anything that can make you trip, such as tools or rocks. Have leaves, snow, or ice cleared regularly. Use sand or salt on paths during winter. Clean up any spills in your garage right away. This includes grease or oil spills. What other actions can I take? Wear shoes that: Have a low heel. Do not wear high heels. Have rubber bottoms. Feel good on your feet and fit well. Are closed at the toe. Do not wear open-toe sandals. Use tools that help you move around if needed. These include: Canes. Walkers. Scooters. Crutches. Review your medicines with your doctor. Some medicines can make you feel dizzy. This can increase your chance of falling. Ask your doctor what else you can do to help prevent falls. Where to find more information Centers for Disease Control and Prevention, [employer]: FootballExhibition.com.brwww.cdc.gov [employer]: https://walker.com/www.nia.nih.gov Contact a doctor if: You are afraid of falling at  home. You feel weak, drowsy, or dizzy at home. You fall at home. Summary There are many simple things that you can do to make your home safe and to help prevent falls. Ways to make your home safe include removing things that can make you trip and installing grab bars in the bathroom. Ask for help when making these changes in your home. This information is not intended to replace advice given to you by your health care provider. Make sure you discuss any questions you have with your health care provider. Document Revised: 01/09/2020 Document Reviewed: 01/09/2020 Elsevier Patient Education  2022 Elsevier Inc.  Health Maintenance, Female Adopting a healthy lifestyle and getting preventive care are important in promoting health and wellness. Ask your health care provider about: The right schedule for you to have regular tests and exams. Things you can do on your own to prevent diseases and keep yourself healthy. What should I know about diet, weight, and exercise? Eat a healthy diet  Eat a diet that includes plenty of vegetables, fruits, low-fat dairy products, and lean protein. Do not eat a lot of foods that are high in solid fats, added sugars, or sodium. Maintain a healthy weight Body mass index (BMI) is used to identify weight problems. It estimates body fat based on height and weight. Your health care provider can help determine your BMI and help you achieve or maintain a healthy weight. Get regular exercise Get regular exercise. This is one of the most important things you can do for your health. Most adults should: Exercise for at least 150 minutes each week. The exercise should increase your heart rate and make you sweat (moderate-intensity exercise). Do strengthening exercises at least twice a week. This is in addition to the moderate-intensity exercise. Spend less time sitting. Even light physical activity can be beneficial. Watch cholesterol and blood lipids Have your blood  tested  for lipids and cholesterol at 80 years of age, then have this test every 5 years. Have your cholesterol levels checked more often if: Your lipid or cholesterol levels are high. You are older than 80 years of age. You are at high risk for heart disease. What should I know about cancer screening? Depending on your health history and family history, you may need to have cancer screening at various ages. This may include screening for: Breast cancer. Cervical cancer. Colorectal cancer. Skin cancer. Lung cancer. What should I know about heart disease, diabetes, and high blood pressure? Blood pressure and heart disease High blood pressure causes heart disease and increases the risk of stroke. This is more likely to develop in people who have high blood pressure readings or are overweight. Have your blood pressure checked: Every 3-5 years if you are 4318-80 years of age. Every year if you are 412 years old or older. Diabetes Have regular diabetes screenings. This checks your fasting blood sugar level. Have the screening done: Once every three years after age 80 if you are at a normal weight and have a low risk for diabetes. More often and at a younger age if you are overweight or have a high risk for diabetes. What should I know about preventing infection? Hepatitis B If you have a higher risk for hepatitis B, you should be screened for this virus. Talk with your health care provider to find out if you are at risk for hepatitis B infection. Hepatitis C Testing is recommended for: Everyone born from [id number] through 1965. Anyone with known risk factors for hepatitis C. Sexually transmitted infections (STIs) Get screened for STIs, including gonorrhea and chlamydia, if: You are sexually active and are younger than 80 years of age. You are older than 80 years of age and your health care provider tells you that you are at risk for this type of infection. Your sexual activity has changed since you were  last screened, and you are at increased risk for chlamydia or gonorrhea. Ask your health care provider if you are at risk. Ask your health care provider about whether you are at high risk for HIV. Your health care provider may recommend a prescription medicine to help prevent HIV infection. If you choose to take medicine to prevent HIV, you should first get tested for HIV. You should then be tested every 3 months for as long as you are taking the medicine. Pregnancy If you are about to stop having your period (premenopausal) and you may become pregnant, seek counseling before you get pregnant. Take 400 to 800 micrograms (mcg) of folic acid every day if you become pregnant. Ask for birth control (contraception) if you want to prevent pregnancy. Osteoporosis and menopause Osteoporosis is a disease in which the bones lose minerals and strength with aging. This can result in bone fractures. If you are 80 years old or older, or if you are at risk for osteoporosis and fractures, ask your health care provider if you should: Be screened for bone loss. Take a calcium or vitamin D supplement to lower your risk of fractures. Be given hormone replacement therapy (HRT) to treat symptoms of menopause. Follow these instructions at home: Alcohol use Do not drink alcohol if: Your health care provider tells you not to drink. You are pregnant, may be pregnant, or are planning to become pregnant. If you drink alcohol: Limit how much you have to: 0-1 drink a day. Know how much alcohol is in your  drink. In the [place] one drink equals one 12 oz bottle of beer (355 mL), one 5 oz glass of wine (148 mL), or one 1 oz glass of hard liquor (44 mL). Lifestyle Do not use any products that contain nicotine or tobacco. These products include cigarettes, chewing tobacco, and vaping devices, such as e-cigarettes. If you need help quitting, ask your health care provider. Do not use street drugs. Do not share needles. Ask your  health care provider for help if you need support or information about quitting drugs. General instructions Schedule regular health, dental, and eye exams. Stay current with your vaccines. Tell your health care provider if: You often feel depressed. You have ever been abused or do not feel safe at home. Summary Adopting a healthy lifestyle and getting preventive care are important in promoting health and wellness. Follow your health care provider's instructions about healthy diet, exercising, and getting tested or screened for diseases. Follow your health care provider's instructions on monitoring your cholesterol and blood pressure. This information is not intended to replace advice given to you by your health care provider. Make sure you discuss any questions you have with your health care provider. Document Revised: 10/27/2020 Document Reviewed: 10/27/2020 Elsevier Patient Education  2022 [employer].

## 2021-06-29 NOTE — Progress Notes (Signed)
This AWV is being conducted by TELEHEALTH - AUDIO only. The patient was located at home and I was located in [facility]. The patient's identity was confirmed using their DOB and current address. The patient or his/her legal guardian has consented to being evaluated through a telephone encounter and understands the associated risks (an examination cannot be done and the patient may need to come in for an appointment) / benefits (allows the patient to remain at home, decreasing exposure to coronavirus). I personally spent 34 minutes conducting the AWV.  Subjective:   [name] is a 81 y.o. female who presents for a Medicare Annual Wellness Visit.  The following items have been reviewed and updated today in the appropriate area in the EMR.   Health Risk Assessment  Height, weight, BMI, and BP Visual acuity if needed Depression screen Fall risk / safety level Advance directive discussion Medical and family history were reviewed and updated Updating list of other providers & suppliers Medication reconciliation, including over the counter medicines Cognitive screen Written screening schedule Risk Factor list Personalized health advice, risky behaviors, and treatment advice  Social History   Social History Narrative   Current Social History 06/29/2021        Patient lives with daughter in an apartment which is 1 story. There are not steps up to the entrance the patient uses.       Patient's method of transportation is via family member.      The highest level of education was high school diploma.      The patient currently disabled.      Identified important Relationships are "my oldest son and my daughter that I live with"       Pets : 0       Interests / Fun: "I don't do a lot of things"       Current Stressors: "none"       Religious / Personal Beliefs: "none"        Cardiac Risk Factors include: sedentary lifestyle;advanced age (>39men, >65 women)    Objective:     Vitals: Wt 184 lb (83.5 kg)    LMP  (LMP Unknown)    BMI 28.82 kg/m  Vitals are patient reported  Activities of Daily Living In your present state of health, do you have any difficulty performing the following activities: 06/29/2021 03/30/2021  Hearing? N N  Vision? N N  Difficulty concentrating or making decisions? N N  Walking or climbing stairs? N Y  Dressing or bathing? N N  Doing errands, shopping? N N  Preparing Food and eating ? N -  Using the Toilet? N -  In the past six months, have you accidently leaked urine? N -  Do you have problems with loss of bowel control? N -  Managing your Medications? N -  Managing your Finances? N -  Housekeeping or managing your Housekeeping? N -  Some recent data might be hidden    Goals  Goals      Blood Pressure < 130/80     LDL CALC < 100        Fall Risk Fall Risk  06/29/2021 03/30/2021 09/22/2020 02/18/2020 11/05/2019  Falls in the past year? 0 0 0 0 0  Number falls in past yr: 0 0 0 - 0  Injury with Fall? 0 0 0 - 0  Risk Factor Category  - - - - -  Risk for fall due to : No Fall Risks - Impaired  balance/gait - Impaired balance/gait  Risk for fall due to: Comment - - - - -  Follow up Education provided;Falls prevention discussed Falls evaluation completed Falls evaluation completed Falls prevention discussed -    Depression Screen PHQ 2/9 Scores 06/29/2021 03/30/2021 02/18/2020 11/05/2019  PHQ - 2 Score 0 0 0 0  PHQ- 9 Score - - 0 0     Cognitive Testing Six-Item Cognitive Screener   "I would like to ask you some questions that ask you to use your memory. I am going to name three objects. Please wait until I say all three words, then repeat them. Remember what they are  because I am going to ask you to name them again in a few minutes. Please repeat these words for me: APPLE--TABLE--PENNY. (Interviewer may repeat names 3 times if necessary but repetition not scored.)  Did patient correctly repeat all three words? Yes - may  proceed with screen  What year is this? Correct What month is this? Correct What day of the week is this? Correct  What were the three objects I asked you to remember? Apple Correct Table Incorrect Penny Correct  Score one point for each incorrect answer.  A score of 2 or more points warrants additional investigation.  Patient's score 1     Assessment and Plan:    During the course of the visit the patient was educated and counseled about appropriate screening and preventive services as documented in the assessment and plan.  Recommended Shingles, Tetanus, and Covid booster vaccines.  The printed AVS was given to the patient and included an updated screening schedule, a list of risk factors, and personalized health advice.        Cheral Almas, RPH-CPP  06/29/2021

## 2021-06-30 NOTE — Progress Notes (Signed)
I discussed the AWV findings with the provider who conducted the visit. I was present in the office suite and immediately available to provide assistance and direction throughout the time the service was provided.  ?

## 2021-07-13 ENCOUNTER — Other Ambulatory Visit: Payer: Self-pay | Admitting: Student in an Organized Health Care Education/Training Program

## 2021-07-15 NOTE — Progress Notes (Signed)
Internal Medicine Clinic Attending  Case discussed with Dr. Liang. I reviewed the AWV findings.  I agree with the assessment, diagnosis, and plan of care documented in the AWV note.     

## 2021-07-20 ENCOUNTER — Encounter: Payer: Self-pay | Admitting: Podiatry

## 2021-07-20 ENCOUNTER — Ambulatory Visit (INDEPENDENT_AMBULATORY_CARE_PROVIDER_SITE_OTHER): Payer: Medicare Other | Admitting: Podiatry

## 2021-07-20 ENCOUNTER — Other Ambulatory Visit: Payer: Self-pay

## 2021-07-20 DIAGNOSIS — M79675 Pain in left toe(s): Secondary | ICD-10-CM | POA: Diagnosis not present

## 2021-07-20 DIAGNOSIS — M79674 Pain in right toe(s): Secondary | ICD-10-CM | POA: Diagnosis not present

## 2021-07-20 DIAGNOSIS — B351 Tinea unguium: Secondary | ICD-10-CM | POA: Diagnosis not present

## 2021-07-26 NOTE — Progress Notes (Signed)
Subjective: [name] is a 81 y.o. female patient seen today for follow up of  painful elongated mycotic toenails 1-5 bilaterally which are tender when wearing enclosed shoe gear. Pain is relieved with periodic professional debridement..   New problem(s)/concern(s) today: None    PCP is Tyson Alias, MD. Last visit was: 03/30/2021.  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 Has patient had a PCN reaction causing immediate rash, facial/tongue/throat swelling, SOB or lightheadedness with hypotension:No Has patient had a PCN reaction causing severe rash involving mucus membranes or skin necrosis:No Has patient had a PCN reaction that required hospitalization:No Has patient had a PCN reaction occurring within the last 10 years:no If all of the above answers are "NO", then may proceed with Cephalosporin use.     Objective: Physical Exam  General: Patient is a pleasant 81 y.o. African American female obese in NAD. AAO x 3.   Neurovascular Examination: CFT <3 seconds b/l LE. Palpable DP pulse(s) b/l LE. Faintly palpable PT pulse(s) b/l LE. Pedal hair absent. No pain with calf compression b/l. No edema noted b/l LE. No cyanosis or clubbing noted b/l LE.  Protective sensation intact 5/5 intact bilaterally with 10g monofilament b/l.  Dermatological:  Pedal integument with normal turgor, texture and tone BLE. No open wounds b/l LE. No interdigital macerations noted b/l LE. Toenails 2-5 bilaterally and L hallux elongated, discolored, dystrophic, thickened, and crumbly with subungual debris and tenderness to dorsal palpation. Anonychia noted R hallux. Nailbed(s) epithelialized.   Musculoskeletal:  Muscle strength 5/5 to  all lower extremity muscle groups bilaterally. HAV with bunion deformity noted b/l LE. Hammertoe deformity noted 2-5 b/l.  Assessment: 1. Pain due to onychomycosis of toenails of both feet    Plan: -No new findings. No new orders. -Mycotic toenails 2-5 bilaterally and L hallux were debrided in length and girth with sterile nail nippers and dremel without iatrogenic bleeding. -Patient/POA to call should there be question/concern in the interim.  Return in about 3 months (around 10/18/2021).  Freddie Breech, DPM

## 2021-08-04 DIAGNOSIS — M1712 Unilateral primary osteoarthritis, left knee: Secondary | ICD-10-CM | POA: Diagnosis not present

## 2021-08-04 DIAGNOSIS — M1711 Unilateral primary osteoarthritis, right knee: Secondary | ICD-10-CM | POA: Diagnosis not present

## 2021-08-30 ENCOUNTER — Other Ambulatory Visit: Payer: Self-pay | Admitting: Student in an Organized Health Care Education/Training Program

## 2021-10-13 ENCOUNTER — Other Ambulatory Visit: Payer: Self-pay | Admitting: Student in an Organized Health Care Education/Training Program

## 2021-10-19 ENCOUNTER — Ambulatory Visit (INDEPENDENT_AMBULATORY_CARE_PROVIDER_SITE_OTHER): Payer: Medicare Other | Admitting: Podiatry

## 2021-10-19 ENCOUNTER — Encounter: Payer: Self-pay | Admitting: Podiatry

## 2021-10-19 DIAGNOSIS — M79674 Pain in right toe(s): Secondary | ICD-10-CM

## 2021-10-19 DIAGNOSIS — L601 Onycholysis: Secondary | ICD-10-CM

## 2021-10-19 DIAGNOSIS — B351 Tinea unguium: Secondary | ICD-10-CM | POA: Diagnosis not present

## 2021-10-19 DIAGNOSIS — M79675 Pain in left toe(s): Secondary | ICD-10-CM | POA: Diagnosis not present

## 2021-10-25 NOTE — Progress Notes (Signed)
?  Subjective:  ?Patient ID: [name], female    DOB: 05-01-1941,  MRN: [id number] ? ?[name] presents to clinic today for painful elongated mycotic toenails 1-5 bilaterally which are tender when wearing enclosed shoe gear. Pain is relieved with periodic professional debridement. ? ?Patient is accompanied by her daughter on today's visit. ? ?New problem(s): None.  ? ?PCP is Tyson Alias, MD , and last visit was March 30, 2021. ? ?Allergies  ?Allergen Reactions  ? Fluzone [Influenza Virus Vaccine] Other (See Comments)  ?  Oculorespiratory syndrome with high dose Fluzone vaccine 2019  ? Sulfonamide Derivatives Shortness Of Breath  ? Penicillins Swelling and Other (See Comments)  ?  REACTION: Local swelling in right arm after injection. ?Has patient had a PCN reaction causing immediate rash, facial/tongue/throat swelling, SOB or lightheadedness with hypotension:No ?Has patient had a PCN reaction causing severe rash involving mucus membranes or skin necrosis:No ?Has patient had a PCN reaction that required hospitalization:No ?Has patient had a PCN reaction occurring within the last 10 years:no ?If all of the above answers are "NO", then may proceed with Cephalosporin use. ?  ? ? ?Review of Systems: Negative except as noted in the HPI. ? ?Objective: No changes noted in today's physical examination. ?General: Patient is a pleasant 81 y.o. African American female obese in NAD. AAO x 3.  ? ?Neurovascular Examination: ?CFT <3 seconds b/l LE. Palpable DP pulse(s) b/l LE. Faintly palpable PT pulse(s) b/l LE. Pedal hair absent. No pain with calf compression b/l. No edema noted b/l LE. No cyanosis or clubbing noted b/l LE. ? ?Protective sensation intact 5/5 intact bilaterally with 10g monofilament b/l. ? ?Dermatological:  ?Pedal integument with normal turgor, texture and tone BLE. No open wounds b/l LE. No interdigital macerations noted b/l LE. Toenails  1-5 left, 2, 4, 5 right  elongated, discolored,  dystrophic, thickened, and crumbly with subungual debris and tenderness to dorsal palpation. Anonychia noted R hallux. Nailbed(s) epithelialized. There is noted onchyolysis of entire nailplate with subungual seroma of the right 3rd digit. There is no erythema, no edema, no drainage, no underlying fluctuance.  ? ?Musculoskeletal:  ?Muscle strength 5/5 to all lower extremity muscle groups bilaterally. HAV with bunion deformity noted b/l LE. Hammertoe deformity noted 2-5 b/l. ? ?  Latest Ref Rng & Units 03/30/2021  ? 11:09 AM  ?Hemoglobin A1C  ?Hemoglobin-A1c 4.0 - 5.6 % 5.0    ? ?Assessment/Plan: ?1. Pain due to onychomycosis of toenails of both feet   ?2. Onycholysis of toenail   ?  ?-Patient was evaluated and treated. All patient's and/or POA's questions/concerns answered on today's visit. ?-Toenails 1-5 left foot, R 2nd toe, R 4th toe, and R 5th toe debrided in length and girth without iatrogenic bleeding with sterile nail nipper and dremel.  ?-Right 3rd toe nailplate gently debrided from it's remaining attachment to digit. Nailbed cleansed with alcohol. Triple antibiotic ointment applied. Instructed to apply Neosporin to digit once daily for one week. ?-Patient/POA to call should there be question/concern in the interim.  ? ?Return in about 3 months (around 01/19/2022). ? ?Freddie Breech, DPM  ?

## 2021-11-09 ENCOUNTER — Ambulatory Visit (INDEPENDENT_AMBULATORY_CARE_PROVIDER_SITE_OTHER): Payer: Medicare Other | Admitting: Student in an Organized Health Care Education/Training Program

## 2021-11-09 ENCOUNTER — Encounter: Payer: Self-pay | Admitting: Student in an Organized Health Care Education/Training Program

## 2021-11-09 VITALS — BP 127/70 | HR 62 | Temp 98.2°F | Ht 67.0 in | Wt 187.8 lb

## 2021-11-09 DIAGNOSIS — I10 Essential (primary) hypertension: Secondary | ICD-10-CM

## 2021-11-09 DIAGNOSIS — R600 Localized edema: Secondary | ICD-10-CM

## 2021-11-09 DIAGNOSIS — Z87891 Personal history of nicotine dependence: Secondary | ICD-10-CM

## 2021-11-09 DIAGNOSIS — I129 Hypertensive chronic kidney disease with stage 1 through stage 4 chronic kidney disease, or unspecified chronic kidney disease: Secondary | ICD-10-CM

## 2021-11-09 DIAGNOSIS — M810 Age-related osteoporosis without current pathological fracture: Secondary | ICD-10-CM

## 2021-11-09 DIAGNOSIS — M170 Bilateral primary osteoarthritis of knee: Secondary | ICD-10-CM | POA: Diagnosis not present

## 2021-11-09 DIAGNOSIS — N1831 Chronic kidney disease, stage 3a: Secondary | ICD-10-CM

## 2021-11-09 DIAGNOSIS — E559 Vitamin D deficiency, unspecified: Secondary | ICD-10-CM | POA: Diagnosis not present

## 2021-11-09 MED ORDER — MELOXICAM 7.5 MG PO TABS: 7.5000 mg | ORAL_TABLET | Freq: Every day | ORAL | 2 refills | Status: DC | PRN

## 2021-11-09 MED ORDER — FLUTICASONE PROPIONATE 50 MCG/ACT NA SUSP: 1.0000 | Freq: Every day | NASAL | 2 refills | Status: DC

## 2021-11-09 NOTE — Assessment & Plan Note (Signed)
Chronic LE edema is stable today. She continues to treat with compression stockings, and I encouraged ambulation and elevation. As adjuvant given chronic HFpEF, she also uses Lasix 40-80mg  daily as needed.

## 2021-11-09 NOTE — Assessment & Plan Note (Signed)
Blood pressure well controlled today. Plan to continue with lisinopril 20mg  daily. Also uses atenolol 50mg  daily due to history of ischemic heart disease, which is fine.

## 2021-11-09 NOTE — Assessment & Plan Note (Signed)
Stable so far. Will plan to check BMP today. Continue with lisinopril 20mg  daily.

## 2021-11-09 NOTE — Assessment & Plan Note (Signed)
Bilateral knee osteoarthritis, not too often function limiting. She would like to take meloxicam as needed, I counciled caution, to only use it on bad days. I warned about ulcer disease and the impacts it could have on her CKD if taken daily. Plan to use no more than 2 doses per week, which she understands.

## 2021-11-09 NOTE — Assessment & Plan Note (Signed)
Will check vitamin D now that we are stopping bisphophonate therapy. Maintaining adequate vitamin D may be helpful in slightly lowering fracture risk moving forward.

## 2021-11-09 NOTE — Assessment & Plan Note (Signed)
Fragility fracture in 2017. She had some issues with adherence to bisphosphonate therapy, but seems to have completed nearly 5 years of therapy. She is more interested in reducing pill burden at this point, so will discontinue further alendronate for now.

## 2021-11-09 NOTE — Progress Notes (Signed)
Established Patient Office Visit  Subjective   Patient ID: [name], female    DOB: 1941-04-12  Age: 81 y.o. MRN: [id number]  Chief Complaint  Patient presents with   Knee Pain    HPI  81 year old person here for follow up of chronic conditions. She is doing well, lives at home with her daughter who helps her with iADLs. They just moved houses this weekend, so under a lot of stress. No issues with recent medications. No hospitalizations, no ED visits, no acute illnesses. She denies fevers, no chest pain or pressure, dyspnea with exertion is chronic and stable, about NYHA class II symptoms which is acceptable for her current functional status.     Objective:     BP 127/70 (BP Location: Right Arm, Patient Position: Sitting, Cuff Size: Small)   Pulse 62   Temp 98.2 F (36.8 C) (Oral)   Ht 5\' 7"  (1.702 m)   Wt 187 lb 12.8 oz (85.2 kg)   LMP  (LMP Unknown)   SpO2 100%   BMI 29.41 kg/m    Physical Exam  Gen: well appearing, no distress Neck: normal thyroid, no adenopathy CV: RRR, 3/6 early systolic murmur at the RUSB Abd: soft, non tender Ext: 2+ chronic non pitting edema. There is a small left sided knee effusion with boney overgrowth Neuro: alert, conversational, full strength in upper and lower extremities    Assessment & Plan:   Problem List Items Addressed This Visit       High   Osteoporosis - Primary (Chronic)    Fragility fracture in 2017. She had some issues with adherence to bisphosphonate therapy, but seems to have completed nearly 5 years of therapy. She is more interested in reducing pill burden at this point, so will discontinue further alendronate for now.       Bilateral leg edema due to diastolic dysfunction (Chronic)    Chronic LE edema is stable today. She continues to treat with compression stockings, and I encouraged ambulation and elevation. As adjuvant given chronic HFpEF, she also uses Lasix 40-80mg  daily as needed.          Medium     Essential hypertension (Chronic)    Blood pressure well controlled today. Plan to continue with lisinopril 20mg  daily. Also uses atenolol 50mg  daily due to history of ischemic heart disease, which is fine.       Relevant Orders   Lipid Profile   Osteoarthritis (Chronic)    Bilateral knee osteoarthritis, not too often function limiting. She would like to take meloxicam as needed, I counciled caution, to only use it on bad days. I warned about ulcer disease and the impacts it could have on her CKD if taken daily. Plan to use no more than 2 doses per week, which she understands.       Relevant Medications   meloxicam (MOBIC) 7.5 MG tablet   Stage 3a chronic kidney disease (CKD) (HCC) (Chronic)    Stable so far. Will plan to check BMP today. Continue with lisinopril 20mg  daily.       Relevant Orders   BMP8+Anion Gap   Magnesium     Low   Vitamin D deficiency (Chronic)    Will check vitamin D now that we are stopping bisphophonate therapy. Maintaining adequate vitamin D may be helpful in slightly lowering fracture risk moving forward.       Relevant Orders   Vitamin D (25 hydroxy)    Return in about 6  months (around 05/12/2022).    Tyson Alias, MD

## 2021-11-10 ENCOUNTER — Ambulatory Visit: Payer: Medicare Other | Admitting: Adult Health

## 2021-11-10 ENCOUNTER — Telehealth: Payer: Self-pay | Admitting: Student in an Organized Health Care Education/Training Program

## 2021-11-10 ENCOUNTER — Encounter: Payer: Self-pay | Admitting: Adult Health

## 2021-11-10 ENCOUNTER — Encounter: Payer: Self-pay | Admitting: Student in an Organized Health Care Education/Training Program

## 2021-11-10 LAB — BMP8+ANION GAP
Anion Gap: 12 mmol/L (ref 10.0–18.0)
BUN/Creatinine Ratio: 11 — ABNORMAL LOW (ref 12–28)
BUN: 11 mg/dL (ref 8–27)
CO2: 27 mmol/L (ref 20–29)
Calcium: 9.7 mg/dL (ref 8.7–10.3)
Chloride: 103 mmol/L (ref 96–106)
Creatinine, Ser: 0.98 mg/dL (ref 0.57–1.00)
Glucose: 98 mg/dL (ref 70–99)
Potassium: 3.9 mmol/L (ref 3.5–5.2)
Sodium: 142 mmol/L (ref 134–144)
eGFR: 58 mL/min/{1.73_m2} — ABNORMAL LOW (ref >59)

## 2021-11-10 LAB — MAGNESIUM: Magnesium: 1.8 mg/dL (ref 1.6–2.3)

## 2021-11-10 LAB — LIPID PANEL
Chol/HDL Ratio: 2.7 ratio (ref 0.0–4.4)
Cholesterol, Total: 165 mg/dL (ref 100–199)
HDL: 61 mg/dL (ref >39)
LDL Chol Calc (NIH): 94 mg/dL (ref 0–99)
Triglycerides: 51 mg/dL (ref 0–149)
VLDL Cholesterol Cal: 10 mg/dL (ref 5–40)

## 2021-11-10 LAB — VITAMIN D 25 HYDROXY (VIT D DEFICIENCY, FRACTURES): Vit D, 25-Hydroxy: 11.1 ng/mL — ABNORMAL LOW (ref 30.0–100.0)

## 2021-11-10 MED ORDER — VITAMIN D3 50 MCG (2000 UT) PO CAPS: 2000.0000 [IU] | ORAL_CAPSULE | Freq: Every day | ORAL | 1 refills | Status: DC

## 2021-11-10 NOTE — Telephone Encounter (Signed)
I left VM for patient regarding labs from yesterday. Only change to make is that she has worsening Vitamin D deficiency. I have sent a vitamin D tab to her pharmacy, instructions are to take one tablet daily. We will recheck her vitamin D levels in about 3-6 months at our next appointment. I left these instructions in her VM and will send her a letter as well.

## 2021-11-24 DIAGNOSIS — M1711 Unilateral primary osteoarthritis, right knee: Secondary | ICD-10-CM | POA: Diagnosis not present

## 2021-11-24 DIAGNOSIS — M1712 Unilateral primary osteoarthritis, left knee: Secondary | ICD-10-CM | POA: Diagnosis not present

## 2021-11-24 DIAGNOSIS — M170 Bilateral primary osteoarthritis of knee: Secondary | ICD-10-CM | POA: Diagnosis not present

## 2021-12-04 ENCOUNTER — Ambulatory Visit: Payer: Medicare Other | Admitting: Nurse Practitioner

## 2022-01-25 ENCOUNTER — Other Ambulatory Visit: Payer: Self-pay | Admitting: Student in an Organized Health Care Education/Training Program

## 2022-01-27 ENCOUNTER — Ambulatory Visit (INDEPENDENT_AMBULATORY_CARE_PROVIDER_SITE_OTHER): Payer: Medicare Other | Admitting: Podiatry

## 2022-01-27 ENCOUNTER — Encounter: Payer: Self-pay | Admitting: Podiatry

## 2022-01-27 DIAGNOSIS — L601 Onycholysis: Secondary | ICD-10-CM | POA: Diagnosis not present

## 2022-01-27 DIAGNOSIS — B351 Tinea unguium: Secondary | ICD-10-CM | POA: Diagnosis not present

## 2022-01-27 DIAGNOSIS — M79675 Pain in left toe(s): Secondary | ICD-10-CM

## 2022-01-27 DIAGNOSIS — L84 Corns and callosities: Secondary | ICD-10-CM

## 2022-01-27 DIAGNOSIS — M79674 Pain in right toe(s): Secondary | ICD-10-CM | POA: Diagnosis not present

## 2022-02-01 NOTE — Progress Notes (Signed)
Subjective:  Patient ID: [name], female    DOB: 1941-01-03,  MRN: [id number]  [name] presents to clinic today for painful thick toenails that are difficult to trim. Pain interferes with ambulation. Aggravating factors include wearing enclosed shoe gear. Pain is relieved with periodic professional debridement.  New problem(s): None.   PCP is Tyson Alias, MD , and last visit was  Nov 09, 2021  Allergies  Allergen Reactions   Fluzone [Influenza Virus Vaccine] Other (See Comments)    Oculorespiratory syndrome with high dose Fluzone vaccine 2019   Sulfonamide Derivatives Shortness Of Breath   Penicillins Swelling and Other (See Comments)    REACTION: Local swelling in right arm after injection. Has patient had a PCN reaction causing immediate rash, facial/tongue/throat swelling, SOB or lightheadedness with hypotension:No Has patient had a PCN reaction causing severe rash involving mucus membranes or skin necrosis:No Has patient had a PCN reaction that required hospitalization:No Has patient had a PCN reaction occurring within the last 10 years:no If all of the above answers are "NO", then may proceed with Cephalosporin use.     Review of Systems: Negative except as noted in the HPI.  Objective: No changes noted in today's physical examination. General: Patient is a pleasant 81 y.o. African American female obese in NAD. AAO x 3.   Neurovascular Examination: CFT <3 seconds b/l LE. Palpable DP pulse(s) b/l LE. Faintly palpable PT pulse(s) b/l LE. Pedal hair absent. No pain with calf compression b/l. No edema noted b/l LE. No cyanosis or clubbing noted b/l LE.  Protective sensation intact 5/5 intact bilaterally with 10g monofilament b/l.  Dermatological:  Pedal integument with normal turgor, texture and tone BLE. No open wounds b/l LE. No interdigital macerations noted b/l LE. Toenails  1-5 left, 2-5 right  elongated, discolored, dystrophic, thickened, and  crumbly with subungual debris and tenderness to dorsal palpation. Anonychia noted R hallux. Nailbed(s) epithelialized.   There is noted onchyolysis of nailplate of the left great toe.  The nailbed remains intact. There is no erythema, no edema, no drainage, no underlying fluctuance.   Hyperkeratotic lesion(s) distal tip of left 3rd toe.  No erythema, no edema, no drainage, no fluctuance.  Musculoskeletal:  Muscle strength 5/5 to all lower extremity muscle groups bilaterally. HAV with bunion deformity noted b/l LE. Hammertoe deformity noted 2-5 b/l.     Latest Ref Rng & Units 03/30/2021   11:09 AM  Hemoglobin A1C  Hemoglobin-A1c 4.0 - 5.6 % 5.0    Assessment/Plan: 1. Pain due to onychomycosis of toenails of both feet   2. Onycholysis of toenail   3. Corns   4. Pain in left toe(s)   -Examined patient. -No new findings. No new orders. -Medicare ABN signed for services of paring of corn(s)/callus(es)/porokeratos(es). Copy in patient chart. -Patient to continue soft, supportive shoe gear daily. -Mycotic toenails 1-5 right foot and 2-5 left foot were debrided in length and girth with sterile nail nippers and dremel without iatrogenic bleeding. -Loose nailplate left great toe gently debrided to level of adherence. Digit cleansed with alcohol. triple antibiotic ointment applied to nailbed followed by light dressing. No further treatment required by patient. -Corn(s) distal tip of left 3rd toe pared utilizing sterile scalpel blade without complication or incident. Total number debrided=1. -Dispensed tube foam for b/l great toes and toe cap for left 3rd toe. Apply to affected digits every morning. Remove every evening. -Patient/POA to call should there be question/concern in the interim.   Return  in about 3 months (around 04/29/2022).  Freddie Breech, DPM

## 2022-02-13 ENCOUNTER — Other Ambulatory Visit: Payer: Self-pay | Admitting: Student in an Organized Health Care Education/Training Program

## 2022-03-09 ENCOUNTER — Other Ambulatory Visit: Payer: Self-pay | Admitting: Student in an Organized Health Care Education/Training Program

## 2022-04-08 DIAGNOSIS — M1711 Unilateral primary osteoarthritis, right knee: Secondary | ICD-10-CM | POA: Diagnosis not present

## 2022-04-08 DIAGNOSIS — M170 Bilateral primary osteoarthritis of knee: Secondary | ICD-10-CM | POA: Diagnosis not present

## 2022-04-08 DIAGNOSIS — M1712 Unilateral primary osteoarthritis, left knee: Secondary | ICD-10-CM | POA: Diagnosis not present

## 2022-05-19 ENCOUNTER — Ambulatory Visit (INDEPENDENT_AMBULATORY_CARE_PROVIDER_SITE_OTHER): Payer: Medicare Other | Admitting: Podiatry

## 2022-05-19 ENCOUNTER — Encounter: Payer: Self-pay | Admitting: Podiatry

## 2022-05-19 DIAGNOSIS — L84 Corns and callosities: Secondary | ICD-10-CM | POA: Diagnosis not present

## 2022-05-19 DIAGNOSIS — M79672 Pain in left foot: Secondary | ICD-10-CM | POA: Diagnosis not present

## 2022-05-19 DIAGNOSIS — M79674 Pain in right toe(s): Secondary | ICD-10-CM | POA: Diagnosis not present

## 2022-05-19 DIAGNOSIS — M79671 Pain in right foot: Secondary | ICD-10-CM | POA: Diagnosis not present

## 2022-05-19 DIAGNOSIS — B351 Tinea unguium: Secondary | ICD-10-CM

## 2022-05-19 DIAGNOSIS — M79675 Pain in left toe(s): Secondary | ICD-10-CM

## 2022-05-19 NOTE — Progress Notes (Signed)
  Subjective:  Patient ID: [name], female    DOB: February 03, 1941,  MRN: [id number]  [name] presents to clinic today for corn(s) left lower extremity and painful thick toenails that are difficult to trim. Painful toenails interfere with ambulation. Aggravating factors include wearing enclosed shoe gear. Pain is relieved with periodic professional debridement. Painful corns are aggravated when weightbearing when wearing enclosed shoe gear. Pain is relieved with periodic professional debridement.  Chief Complaint  Patient presents with   Nail Problem    Routine foot care PCP-Thomas Vincent PCP VST-10/2021   New problem(s): None.   PCP is Tyson Alias, MD.  Allergies  Allergen Reactions   Fluzone [place] Virus Vaccine] Other (See Comments)    Oculorespiratory syndrome with high dose Fluzone vaccine 2019   Sulfonamide Derivatives Shortness Of Breath   Penicillins Swelling and Other (See Comments)    REACTION: Local swelling in right arm after injection. Has patient had a PCN reaction causing immediate rash, facial/tongue/throat swelling, SOB or lightheadedness with hypotension:No Has patient had a PCN reaction causing severe rash involving mucus membranes or skin necrosis:No Has patient had a PCN reaction that required hospitalization:No Has patient had a PCN reaction occurring within the last 10 years:no If all of the above answers are "NO", then may proceed with Cephalosporin use.     Review of Systems: Negative except as noted in the HPI.  Objective: No changes noted in today's physical examination.  [name] is a pleasant 81 y.o. female in NAD. AAO x 3.   Neurovascular Examination: CFT <3 seconds b/l LE. Palpable DP pulse(s) b/l LE. Faintly palpable PT pulse(s) b/l LE. Pedal hair absent. No pain with calf compression b/l. No edema noted b/l LE. No cyanosis or clubbing noted b/l LE.  Protective sensation intact 5/5 intact bilaterally with 10g  monofilament b/l.  Dermatological:  Pedal integument with normal turgor, texture and tone BLE. No open wounds b/l LE. No interdigital macerations noted b/l LE. Toenails  1-5 left, 2-5 right  elongated, discolored, dystrophic, thickened, and crumbly with subungual debris and tenderness to dorsal palpation. Anonychia noted R hallux. Nailbed(s) epithelialized.   Hyperkeratotic lesion(s) distal tip of left 3rd toe.  No erythema, no edema, no drainage, no fluctuance.  Musculoskeletal:  Muscle strength 5/5 to all lower extremity muscle groups bilaterally. HAV with bunion deformity noted b/l LE. Hammertoe deformity noted 2-5 b/l.  Assessment/Plan: 1. Pain due to onychomycosis of toenails of both feet   2. Corns   3. Pain in both feet     No orders of the defined types were placed in this encounter.   -Patient was evaluated and treated. All patient's and/or POA's questions/concerns answered on today's visit. -Continue supportive shoe gear daily. -Toenails 1-5 right foot and 2-5 left foot debrided in length and girth without iatrogenic bleeding with sterile nail nipper and dremel.  -Corn(s) distal tip of left 3rd toe pared utilizing sterile scalpel blade without complication or incident. Total number debrided=1. -Patient/POA to call should there be question/concern in the interim.   Return in about 3 months (around 08/19/2022).  Freddie Breech, DPM

## 2022-06-16 ENCOUNTER — Other Ambulatory Visit: Payer: Self-pay

## 2022-06-17 ENCOUNTER — Other Ambulatory Visit: Payer: Self-pay | Admitting: Student in an Organized Health Care Education/Training Program

## 2022-06-17 MED ORDER — ATENOLOL 50 MG PO TABS: 50.0000 mg | ORAL_TABLET | Freq: Every day | ORAL | 3 refills | Status: DC

## 2022-06-22 ENCOUNTER — Other Ambulatory Visit: Payer: Self-pay

## 2022-06-22 ENCOUNTER — Emergency Department (HOSPITAL_BASED_OUTPATIENT_CLINIC_OR_DEPARTMENT_OTHER)
Admission: EM | Admit: 2022-06-22 | Discharge: 2022-06-22 | Disposition: A | Discharge status: Home / Self Care | Payer: No Typology Code available for payment source | Attending: Emergency Medicine | Admitting: Emergency Medicine

## 2022-06-22 ENCOUNTER — Emergency Department (HOSPITAL_BASED_OUTPATIENT_CLINIC_OR_DEPARTMENT_OTHER): Payer: No Typology Code available for payment source

## 2022-06-22 DIAGNOSIS — W06XXXA Fall from bed, initial encounter: Secondary | ICD-10-CM | POA: Insufficient documentation

## 2022-06-22 DIAGNOSIS — S2241XA Multiple fractures of ribs, right side, initial encounter for closed fracture: Secondary | ICD-10-CM | POA: Diagnosis not present

## 2022-06-22 DIAGNOSIS — S2231XA Fracture of one rib, right side, initial encounter for closed fracture: Secondary | ICD-10-CM | POA: Insufficient documentation

## 2022-06-22 DIAGNOSIS — I10 Essential (primary) hypertension: Secondary | ICD-10-CM | POA: Diagnosis not present

## 2022-06-22 DIAGNOSIS — S20301A Unspecified superficial injuries of right front wall of thorax, initial encounter: Secondary | ICD-10-CM | POA: Diagnosis present

## 2022-06-22 MED ORDER — LIDOCAINE 5 % EX OINT: 1.0000 | TOPICAL_OINTMENT | CUTANEOUS | 0 refills | Status: DC | PRN

## 2022-06-22 MED ORDER — LIDOCAINE 5 % EX PTCH
1.0000 | MEDICATED_PATCH | CUTANEOUS | Status: DC
  Administered 2022-06-22: 1 via TRANSDERMAL
  Filled 2022-06-22: qty 1

## 2022-06-22 MED ORDER — DICLOFENAC SODIUM 1 % EX GEL: 2.0000 g | Freq: Four times a day (QID) | CUTANEOUS | 0 refills | Status: AC | PRN

## 2022-06-22 NOTE — ED Provider Notes (Signed)
Emergency Department Provider Note   I have reviewed the triage vital signs and the nursing notes.   HISTORY  Chief Complaint Fall   HPI [name] is a 82 y.o. female presents to the emergency department for evaluation of pain after fall.  Patient tells me she was trying get out of bed this morning when she fell hitting the right side of her mid back on a stool.  She did not strike her head.  She does take Plavix but is not otherwise anticoagulated.  No pain in her midline back or neck.  No pain in the hips, arms/legs. No fever/chills. No AMS. Patient is here with a family member who cares for her at home.    Past Medical History:  Diagnosis Date   Abnormal electromyogram (EMG)    Low grade right S1 radiculopathy 7/93.   Anemia 63/78/5885   Diastolic dysfunction    a. 08/2012 Echo: EF 60-65%, Gr 2 DD, triv MR, PASP 21mmHg.   Distal radius fracture, right 12/22/2015   DJD (degenerative joint disease)    Elevated alkaline phosphatase level    Bone scan 08/2004 showed no evidence of abnormal bony activity.[name]   Herniated lumbar intervertebral disc    S/P L4-5 & L5-S1 laminotomy, foraminotomy, and L5-S1 diskectomy by Dr. Glenna Fellows 11/01/2000.   Hyperlipemia    Hypertension    Iron deficiency anemia    Osteopenia    A DEXA bone density scan done 02/13/2009 showed a left femur neck young adult T-score of -1.6 (osteopenia), a right femur neck young adult T-score of -1.6 (osteopenia), and an AP spine young adult T-score of 1.3 (normal).  Her FRAX score gave an estimated 10 year probability of 6.6% for major osteoporotic fracture and 0.8% for hip fracture.   Peripheral neuropathy    Rotator cuff syndrome of right shoulder    S/P right shoulder arthroscopic debridement of a massive rotator cuff tear, greater tuberosity and resection of subacromial spur by Dr. Frederik Pear 07/07/2005.   Stroke [facility]) 04/2013   SVT (supraventricular tachycardia)    PSVT   Vitamin D deficiency     Review  of Systems  Constitutional: No fever/chills Cardiovascular: Denies chest pain. Respiratory: Denies shortness of breath. Gastrointestinal: No abdominal pain.   Musculoskeletal: Positive right back/posterior chest wall pain.  Skin: Negative for rash. Neurological: Negative for headaches.  ____________________________________________   PHYSICAL EXAM:  VITAL SIGNS: ED Triage Vitals  Enc Vitals Group     BP 06/22/22 1206 (!) 160/98     Pulse Rate 06/22/22 1206 71     Resp 06/22/22 1206 16     Temp 06/22/22 1206 98 F (36.7 C)     Temp Source 06/22/22 1206 Oral     SpO2 06/22/22 1206 100 %     Weight 06/22/22 1206 188 lb (85.3 kg)     Height 06/22/22 1206 5\' 7"  (1.702 m)   Constitutional: Alert and oriented. Well appearing and in no acute distress. Eyes: Conjunctivae are normal.  Head: Atraumatic. Nose: No congestion/rhinnorhea. Mouth/Throat: Mucous membranes are moist.   Neck: No stridor.  No cervical spine tenderness to palpation. Cardiovascular: Normal rate, regular rhythm. Good peripheral circulation. Grossly normal heart sounds.   Respiratory: Normal respiratory effort.  No retractions. Lungs CTAB. Gastrointestinal: Soft and nontender. No distention.  Musculoskeletal: No lower extremity tenderness nor edema. No gross deformities of extremities.  Tenderness along the right posterior chest wall.  No crepitus or bruising.  No flank bruising.  Neurologic:  Normal speech and language. No gross focal neurologic deficits are appreciated.  Skin:  Skin is warm, dry and intact. No rash noted. ____________________________________________  RADIOLOGY  DG Ribs Unilateral W/Chest Right  Result Date: 06/22/2022 CLINICAL DATA:  Fall from bed onto right side EXAM: RIGHT RIBS AND CHEST - 3 VIEW COMPARISON:  Radiograph of the ribs dated 12/23/2015 FINDINGS: Minimally displaced right lateral ninth rib fracture. There is no evidence of pneumothorax or pleural effusion. Both lungs are clear.  Heart size and mediastinal contours are within normal limits. Right upper quadrant surgical clips. IMPRESSION: 1. Minimally displaced right lateral ninth rib fracture. 2. No acute cardiopulmonary process identified. Electronically Signed   By: Darrin Nipper M.D.   On: 06/22/2022 12:44    ____________________________________________   PROCEDURES  Procedure(s) performed:   Procedures  None  ____________________________________________   INITIAL IMPRESSION / ASSESSMENT AND PLAN / ED COURSE  Pertinent labs & imaging results that were available during my care of the patient were reviewed by me and considered in my medical decision making (see chart for details).   This patient is Presenting for Evaluation of back pain, which does require a range of treatment options, and is a complaint that involves a high risk of morbidity and mortality.  The Differential Diagnoses includes but is not exclusive to musculoskeletal back pain, renal colic, urinary tract infection, pyelonephritis, intra-abdominal causes of back pain, aortic aneurysm or dissection, cauda equina syndrome, sciatica, lumbar disc disease, thoracic disc disease, etc.   Critical Interventions-    Medications - No data to display   Reassessment after intervention: Pain improved.    I did obtain Additional Historical Information from family at bedside.  Radiologic Tests Ordered, included right rib x-ray w/ chest. I independently interpreted the images and agree with radiology interpretation.    Medical Decision Making: Summary:  Patient presents emergency department evaluation after fall while getting out of bed.  Fall appears to be mechanical.  She is at her mental status baseline.  No outward sign of head trauma and no reported head trauma by patient or family.  No midline cervical spine tenderness.  X-ray of the ribs show rib fracture without pneumothorax.  Pain is well-managed with topical medications in the ED.   Reevaluation  with update and discussion with patient and family. Plan for pain mgmt at home and send spirometer provided at discharge.  Considered admission no hypoxemia or issues with pain control.  Appears stable for outpatient management.   Patient's presentation is most consistent with acute illness / injury with system symptoms.   Disposition: discharge  ____________________________________________  FINAL CLINICAL IMPRESSION(S) / ED DIAGNOSES  Final diagnoses:  Closed fracture of one rib of right side, initial encounter     NEW OUTPATIENT MEDICATIONS STARTED DURING THIS VISIT:  Discharge Medication List as of 06/22/2022  3:10 PM     START taking these medications   Details  diclofenac Sodium (VOLTAREN) 1 % GEL Apply 2 g topically 4 (four) times daily as needed., Starting Tue 06/22/2022, Normal    lidocaine (XYLOCAINE) 5 % ointment Apply 1 Application topically as needed., Starting Tue 06/22/2022, Normal        Note:  This document was prepared using Dragon voice recognition software and may include unintentional dictation errors.  Nanda Quinton, MD, [facility]    Chau Savell, Wonda [facility], MD 06/24/22 [phone]

## 2022-06-22 NOTE — ED Triage Notes (Addendum)
Pt had a fall getting out of bed this morning; hit RT side back pain (tender rib area); sts she hit back on a step stool; denies hitting head; takes Plavix

## 2022-06-22 NOTE — Discharge Instructions (Signed)
You were seen in the emergency room today after a fall.  You have a single rib fracture on your x-ray.  I would like for you to use the incentive spirometer that we provided at least once per hour while awake.  This helps to keep your lungs open and prevent pneumonia.  If you develop worsening pain, shortness of breath, fevers, other sudden/severe symptoms please return to the emergency department for reevaluation.

## 2022-06-24 ENCOUNTER — Telehealth: Payer: Self-pay | Admitting: Student in an Organized Health Care Education/Training Program

## 2022-06-24 DIAGNOSIS — M170 Bilateral primary osteoarthritis of knee: Secondary | ICD-10-CM

## 2022-06-24 NOTE — Telephone Encounter (Signed)
Patient called requesting a wheelchair to help with mobilization. She has low back pain due to osteoarthritis and recently had a fall causing a rib fracture. She also has general frailty of advanced age, but no dementia. She feels she can use a manual wheelchair at this point and that it will improve her ability to perform ADLs independently. A walker is currently not sufficient as she struggles to bear weight on her right arm from the rib fracture. She was evaluated at the emergency department on 06/22/22 for this problem. I will order the wheelchair now and follow up with her in February to see if she needs more or different mobility equipment.

## 2022-06-28 ENCOUNTER — Encounter: Payer: No Typology Code available for payment source | Admitting: Student

## 2022-07-05 ENCOUNTER — Other Ambulatory Visit: Payer: Self-pay | Admitting: Student in an Organized Health Care Education/Training Program

## 2022-07-07 NOTE — Telephone Encounter (Signed)
Next appt scheduled 08/02/22 with PCP.  

## 2022-08-02 ENCOUNTER — Ambulatory Visit (INDEPENDENT_AMBULATORY_CARE_PROVIDER_SITE_OTHER): Payer: 59 | Admitting: Student in an Organized Health Care Education/Training Program

## 2022-08-02 ENCOUNTER — Encounter: Payer: Self-pay | Admitting: Student in an Organized Health Care Education/Training Program

## 2022-08-02 ENCOUNTER — Ambulatory Visit: Payer: 59

## 2022-08-02 VITALS — BP 143/67 | HR 75 | Temp 97.8°F | Ht 67.0 in | Wt 172.0 lb

## 2022-08-02 DIAGNOSIS — I10 Essential (primary) hypertension: Secondary | ICD-10-CM

## 2022-08-02 DIAGNOSIS — R600 Localized edema: Secondary | ICD-10-CM

## 2022-08-02 DIAGNOSIS — N1831 Chronic kidney disease, stage 3a: Secondary | ICD-10-CM | POA: Diagnosis not present

## 2022-08-02 DIAGNOSIS — Z23 Encounter for immunization: Secondary | ICD-10-CM

## 2022-08-02 DIAGNOSIS — E559 Vitamin D deficiency, unspecified: Secondary | ICD-10-CM

## 2022-08-02 DIAGNOSIS — M170 Bilateral primary osteoarthritis of knee: Secondary | ICD-10-CM

## 2022-08-02 DIAGNOSIS — I129 Hypertensive chronic kidney disease with stage 1 through stage 4 chronic kidney disease, or unspecified chronic kidney disease: Secondary | ICD-10-CM | POA: Diagnosis not present

## 2022-08-02 DIAGNOSIS — E7800 Pure hypercholesterolemia, unspecified: Secondary | ICD-10-CM

## 2022-08-02 MED ORDER — FUROSEMIDE 40 MG PO TABS: 40.0000 mg | ORAL_TABLET | Freq: Every day | ORAL | 3 refills | Status: DC | PRN

## 2022-08-02 NOTE — Patient Instructions (Signed)
Annual Wellness Visit   Medicare Covered Preventative Screenings and Services  Services & Screenings Men and Women Who How Often Need? Date of Last Service Action  Abdominal Aortic Aneurysm Adults with AAA risk factors Once     Alcohol Misuse and Counseling All Adults Screening once a year if no alcohol misuse. Counseling up to 4 face to face sessions.     Bone Density Measurement  Adults at risk for osteoporosis Once every 2 yrs     Lipid Panel Z13.6 All adults without CV disease Once every 5 yrs     Colorectal Cancer  Stool sample or Colonoscopy All adults 36 and older  Once every year Every 10 years     Depression All Adults Once a year  Today   Diabetes Screening Blood glucose, post glucose load, or GTT Z13.1 All adults at risk Pre-diabetics Once per year Twice per year     Diabetes  Self-Management Training All adults Diabetics 10 hrs first year; 2 hours subsequent years. Requires Copay     Glaucoma Diabetics Family history of glaucoma African Americans 23 yrs + Hispanic Americans 80 yrs + Annually - requires coppay     Hepatitis C Z72.89 or F19.20 High Risk for HCV Born between 1945 and 1965 Annually Once     HIV Z11.4 All adults based on risk Annually btw ages 44 & 69 regardless of risk Annually > 65 yrs if at increased risk     Lung Cancer Screening Asymptomatic adults aged 41-77 with 30 pack yr history and current smoker OR quit within the last 15 yrs Annually Must have counseling and shared decision making documentation before first screen     Medical Nutrition Therapy Adults with  Diabetes Renal disease Kidney transplant within past 3 yrs 3 hours first year; 2 hours subsequent years     Obesity and Counseling All adults Screening once a year Counseling if BMI 30 or higher  Today   Tobacco Use Counseling Adults who use tobacco  Up to 8 visits in one year     Vaccines Z23 Hepatitis B Influenza  Pneumonia  Adults  Once Once every flu season Two different  vaccines separated by one year     Next Annual Wellness Visit People with Medicare Every year       Services & Screenings Women Who How Often Need  Date of Last Service Action  Mammogram  Z12.31 Women over 48 One baseline ages 41-39. Annually ager 40 yrs+     Pap tests All women Annually if high risk. Every 2 yrs for normal risk women     Screening for cervical cancer with  Pap (Z01.419 nl or Z01.411abnl) & HPV Z11.51 Women aged 4 to 82 Once every 5 yrs     Screening pelvic and breast exams All women Annually if high risk. Every 2 yrs for normal risk women     Sexually Transmitted Diseases Chlamydia Gonorrhea Syphilis All at risk adults Annually for non pregnant females at increased risk         [place] Men Who How Ofter Need  Date of Last Service Action  Prostate Cancer - DRE & PSA Men over 50 Annually.  DRE might require a copay.     Sexually Transmitted Diseases Syphilis All at risk adults Annually for men at increased risk         Things That May Be Affecting Your Health:  Alcohol  Hearing loss  Pain    Depression  Home  Safety  Sexual Health   Diabetes  Lack of physical activity  Stress  x Difficulty with daily activities  Loneliness  Tiredness   Drug use  Medicines  Tobacco use  x Falls  Motor Vehicle Safety  Weight   Food choices  Oral Health  Other    YOUR PERSONALIZED HEALTH PLAN : 1. Schedule your next subsequent Medicare Wellness visit in one year 2. Attend all of your regular appointments to address your medical issues 3. Complete the preventative screenings and services

## 2022-08-02 NOTE — Assessment & Plan Note (Signed)
Chronic and stable issue with a cerebral infarction in 2017.  Plan to continue with atorvastatin 80 mg daily and check lipids today.  Continue with Plavix 75 mg daily.

## 2022-08-02 NOTE — Assessment & Plan Note (Signed)
Significant osteoarthritis in bilateral knees and lumbar spine.  This impacts her balance, gait, and puts her at high risk for falls.  She had a fall in January resulting in a right rib fracture that she is still recovering from.  This imbalance impacts her activities of daily living, mostly around toileting, dressing, and hygiene in the bathroom.  Currently uses a walker and a cane as assistive devices.  Can walk about 100 feet with the use of these devices.  Mostly struggles to navigate her bathroom even with the help of these devices.  She is able to maneuver a wheelchair on her own.  Has great support from her family that live with her as well.  Will order a manual wheelchair which I think will help with her ability to perform bathroom ADLs independently.

## 2022-08-02 NOTE — Progress Notes (Signed)
Annual Wellness Visit     Patient: [name], Female    DOB: [birth year], 82 y.o.   MRN: [id number]  Subjective  No chief complaint on file.   [name] is a 82 y.o. female who presents today for her Annual Wellness Visit. She reports consuming a general diet. The patient does not participate in regular exercise at present. She generally feels well. She reports sleeping well. She does not have additional problems to discuss today.   Patient had a fall in January resulting in a rib fracture that was managed conservatively.  She reports this is healing well.  Has had no subsequent falls at home.  She lives with 2 family members that help her with activities of daily living.  She ambulates with the use of a walker or a cane to assist her.  She does feel unsteady when she is in her bathroom.   Patient Active Problem List   Diagnosis Date Noted   Osteoporosis 04/03/2009    Priority: High   Bilateral leg edema due to diastolic dysfunction [id number]    Priority: High   Stage 3a chronic kidney disease (CKD) ([place]) 11/09/2021    Priority: Medium    Ischemic vascular disease 11/20/2018    Priority: Medium    Chronic diastolic congestive heart failure ([place]) 09/28/2015    Priority: Medium    Risk for falls 03/03/2015    Priority: Medium    Essential hypertension 09/14/2006    Priority: Medium    Hyperlipidemia 04/27/2006    Priority: Medium    Osteoarthritis 04/27/2006    Priority: Medium    Lower back pain 04/25/2015    Priority: [place] maintenance 04/08/2014    Priority: Low   Hyperglycemia 06/01/2012    Priority: Low   Vitamin D deficiency 05/08/2010    Priority: Low      Medications: Outpatient Medications Prior to Visit  Medication Sig   atenolol (TENORMIN) 50 MG tablet Take 1 tablet (50 mg total) by mouth daily.   atorvastatin (LIPITOR) 80 MG tablet TAKE 1 TABLET BY MOUTH EVERY DAY AT 6PM   Cholecalciferol (VITAMIN D3) 50 MCG (2000 UT) capsule TAKE 1  CAPSULE BY MOUTH EVERY DAY   clopidogrel (PLAVIX) 75 MG tablet TAKE 1 TABLET BY MOUTH EVERY DAY   diclofenac Sodium (VOLTAREN) 1 % GEL Apply 2 g topically 4 (four) times daily as needed.   fluticasone (FLONASE) 50 MCG/ACT nasal spray Place 1 spray into both nostrils daily.   lidocaine (XYLOCAINE) 5 % ointment Apply 1 Application topically as needed.   lisinopril (ZESTRIL) 20 MG tablet TAKE 1 TABLET BY MOUTH EVERY DAY   meloxicam (MOBIC) 7.5 MG tablet TAKE 1 TABLET (7.5 MG TOTAL) BY MOUTH DAILY AS NEEDED FOR PAIN (NOT TO EXCEED TWO DOSES PER WEEK).   [DISCONTINUED] furosemide (LASIX) 40 MG tablet TAKE 2 TABLETS BY MOUTH EVERY DAY   No facility-administered medications prior to visit.    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 Has patient had a PCN reaction causing immediate rash, facial/tongue/throat swelling, SOB or lightheadedness with hypotension:No Has patient had a PCN reaction causing severe rash involving mucus membranes or skin necrosis:No Has patient had a PCN reaction that required hospitalization:No Has  patient had a PCN reaction occurring within the last 10 years:no If all of the above answers are "NO", then may proceed with Cephalosporin use.     Patient Care Team: Axel Filler, MD as PCP - General Elisha Ponder Stephani Police, DPM as Consulting Physician (Podiatry) Ward Givens, NP as Registered Nurse (Neurology)  ROS No fevers or chills, no chest pain or dyspnea on exertion     Objective  BP (!) 161/82 (BP Location: Left Arm, Patient Position: Sitting, Cuff Size: Small)   Pulse 84   Temp 97.8 F (36.6 C) (Oral)   Wt 172 lb (78 kg)   LMP  (LMP Unknown)   SpO2 100%   BMI 26.94 kg/m    Physical  Exam  General: Well-appearing woman, in wheelchair, no distress Neuro: Alert, conversational, full strength in the upper and lower extremities, unable to rise from standing due to arthritis and imbalance CV: Regular rate and rhythm, 2 out of 6 early systolic murmur at the right upper sternal border Lungs: Clear throughout with no wheezing or crackles Ext: Warm and well-perfused.  1+ chronic nonpitting lower extremity edema.  Compression socks are in place.  Bulky osteoarthritis of bilateral knees with no acute joint effusion  Most recent functional status assessment:    11/09/2021   11:22 AM  In your present state of health, do you have any difficulty performing the following activities:  Hearing? 0  Vision? 0  Difficulty concentrating or making decisions? 0  Walking or climbing stairs? 0  Dressing or bathing? 0  Doing errands, shopping? 0   Most recent fall risk assessment:    11/09/2021   11:22 AM  Fall Risk   Falls in the past year? 0  Number falls in past yr: 0  Injury with Fall? 0  Follow up Falls evaluation completed    Most recent depression screenings:    11/09/2021   11:22 AM 06/29/2021   10:48 AM  PHQ 2/9 Scores  PHQ - 2 Score 0 0   Most recent cognitive screening:    06/29/2021   10:50 AM  6CIT Screen  What Year? 0 points  What month? 0 points  What time? 0 points  Count back from 20 0 points  Months in reverse 0 points  Repeat phrase 2 points  Total Score 2 points   Mini Cog Score 5.  Patient had an abnormal clock draw, she could not recall 1 word on delayed recall.  A score of 3 or more in women, and 4 or more in men indicates increased risk for alcohol abuse, EXCEPT if all of the points are from question 1    Assessment & Plan   Annual wellness visit done today including the all of the following: Reviewed patient's Family Medical History Reviewed and updated list of patient's medical providers Assessment of cognitive impairment was done Assessed  patient's functional ability Established a written schedule for health screening [place] Completed and Reviewed  Exercise Activities and Dietary recommendations  Goals      Blood Pressure < 130/80     LDL CALC < 100        Immunization History  Administered Date(s) Administered   Fluad Quad(high Dose 65+) 03/30/2021   Influenza Split 05/05/2011, 05/31/2012   Influenza Whole 05/04/2006, 07/10/2007, 04/24/2008, 06/25/2009, 03/31/2010   Influenza, High Dose Seasonal PF 03/27/2018   Influenza,inj,Quad PF,6+ Mos 05/10/2013, 04/08/2014, 03/03/2015, 03/29/2016, 02/28/2017   PFIZER(Purple Top)SARS-COV-2 Vaccination 10/04/2019, 10/29/2019, 06/22/2020   Pneumococcal Conjugate-13 08/07/2014  Pneumococcal Polysaccharide-23 06/03/2005, 05/05/2011   Td 03/31/2010    Health Maintenance  Topic Date Due   Zoster Vaccines- Shingrix (1 of 2) Never done   DTaP/Tdap/Td (2 - Tdap) 03/31/2020   INFLUENZA VACCINE  01/19/2022   COVID-19 Vaccine (4 - 2023-24 season) 02/19/2022   Medicare Annual Wellness (AWV)  08/03/2023   Pneumonia Vaccine 14+ Years old  Completed   DEXA SCAN  Completed   HPV VACCINES  Aged Out     Discussed health benefits of physical activity, and encouraged her to engage in regular exercise appropriate for her age and condition.    Problem List Items Addressed This Visit       High   Bilateral leg edema due to diastolic dysfunction - Primary (Chronic)    Chronic lower extremity mostly nonpitting edema is multifactorial due to okay osteoarthritis of both knees, low functional status with sedentary lifestyle, and chronic heart failure with preserved ejection fraction/grade 1 diastolic dysfunction.  She appears euvolemic on exam today.  Having more issues with polyuria and frequency that comes with Lasix dosing.  We talked about transitioning to using Lasix 40 mg only as needed instead of every day.  She is comfortable monitoring her legs for lower  extremity edema.  Can continue using supportive care with compression stockings, elevation, and increase ambulation as able.        Medium    Hyperlipidemia (Chronic)    Chronic and stable issue with a cerebral infarction in 2017.  Plan to continue with atorvastatin 80 mg daily and check lipids today.  Continue with Plavix 75 mg daily.      Relevant Medications   furosemide (LASIX) 40 MG tablet   Other Relevant Orders   Lipid Profile   Osteoarthritis (Chronic)    Significant osteoarthritis in bilateral knees and lumbar spine.  This impacts her balance, gait, and puts her at high risk for falls.  She had a fall in January resulting in a right rib fracture that she is still recovering from.  This imbalance impacts her activities of daily living, mostly around toileting, dressing, and hygiene in the bathroom.  Currently uses a walker and a cane as assistive devices.  Can walk about 100 feet with the use of these devices.  Mostly struggles to navigate her bathroom even with the help of these devices.  She is able to maneuver a wheelchair on her own.  Has great support from her family that live with her as well.  Will order a manual wheelchair which I think will help with her ability to perform bathroom ADLs independently.      Relevant Orders   Ambulatory Referral for DME   Stage 3a chronic kidney disease (CKD) ([place]) (Chronic)    Chronic and stable issue.  Plan to check BMP today.  Continuing with lisinopril 20 mg daily.      Relevant Orders   BMP8+Anion Gap   Essential hypertension (Chronic)   Relevant Medications   furosemide (LASIX) 40 MG tablet     Low   Vitamin D deficiency (Chronic)   Relevant Orders   Vitamin D (25 hydroxy)    Return in about 6 months (around 01/31/2023).     Axel Filler, MD

## 2022-08-02 NOTE — Assessment & Plan Note (Signed)
Chronic and stable issue.  Plan to check BMP today.  Continuing with lisinopril 20 mg daily.

## 2022-08-02 NOTE — Assessment & Plan Note (Signed)
Chronic lower extremity mostly nonpitting edema is multifactorial due to okay osteoarthritis of both knees, low functional status with sedentary lifestyle, and chronic heart failure with preserved ejection fraction/grade 1 diastolic dysfunction.  She appears euvolemic on exam today.  Having more issues with polyuria and frequency that comes with Lasix dosing.  We talked about transitioning to using Lasix 40 mg only as needed instead of every day.  She is comfortable monitoring her legs for lower extremity edema.  Can continue using supportive care with compression stockings, elevation, and increase ambulation as able.

## 2022-08-04 LAB — BMP8+ANION GAP
Anion Gap: 14 mmol/L (ref 10.0–18.0)
BUN/Creatinine Ratio: 16 (ref 12–28)
BUN: 15 mg/dL (ref 8–27)
CO2: 25 mmol/L (ref 20–29)
Calcium: 9.8 mg/dL (ref 8.7–10.3)
Chloride: 105 mmol/L (ref 96–106)
Creatinine, Ser: 0.91 mg/dL (ref 0.57–1.00)
Glucose: 99 mg/dL (ref 70–99)
Potassium: 4.2 mmol/L (ref 3.5–5.2)
Sodium: 144 mmol/L (ref 134–144)
eGFR: 64 mL/min/{1.73_m2} (ref >59)

## 2022-08-04 LAB — LIPID PANEL
Chol/HDL Ratio: 3.5 ratio (ref 0.0–4.4)
Cholesterol, Total: 233 mg/dL — ABNORMAL HIGH (ref 100–199)
HDL: 67 mg/dL (ref >39)
LDL Chol Calc (NIH): 157 mg/dL — ABNORMAL HIGH (ref 0–99)
Triglycerides: 53 mg/dL (ref 0–149)
VLDL Cholesterol Cal: 9 mg/dL (ref 5–40)

## 2022-08-04 LAB — VITAMIN D 25 HYDROXY (VIT D DEFICIENCY, FRACTURES): Vit D, 25-Hydroxy: 16.2 ng/mL — ABNORMAL LOW (ref 30.0–100.0)

## 2022-08-05 ENCOUNTER — Telehealth: Payer: Self-pay | Admitting: Student in an Organized Health Care Education/Training Program

## 2022-08-05 NOTE — Telephone Encounter (Signed)
I spoke with the patient by phone. She inadvertently received a high dose flu vaccine despite having a minor allergy in the past. She tolerated it well so far. No issues with ocular or respiratory symptoms. We will continue with flu vaccination in the future, but would prefer the regular dose.   I spoke with her about the large increase in LDL from 94 to 157. The patient admits to not taking atorvastatin for some time because of family events and some stressors recently. She has a history of cerebral infarction and we set a goal to get back on daily atorvastatin to lower her risk of future ischemic events. Will recheck LDL in about 2-3 months.

## 2022-09-08 ENCOUNTER — Ambulatory Visit (INDEPENDENT_AMBULATORY_CARE_PROVIDER_SITE_OTHER): Payer: 59 | Admitting: Podiatry

## 2022-09-08 ENCOUNTER — Encounter: Payer: Self-pay | Admitting: Podiatry

## 2022-09-08 VITALS — BP 157/79

## 2022-09-08 DIAGNOSIS — G609 Hereditary and idiopathic neuropathy, unspecified: Secondary | ICD-10-CM

## 2022-09-08 DIAGNOSIS — M79674 Pain in right toe(s): Secondary | ICD-10-CM

## 2022-09-08 DIAGNOSIS — M79675 Pain in left toe(s): Secondary | ICD-10-CM | POA: Diagnosis not present

## 2022-09-08 DIAGNOSIS — B351 Tinea unguium: Secondary | ICD-10-CM | POA: Diagnosis not present

## 2022-09-08 DIAGNOSIS — L84 Corns and callosities: Secondary | ICD-10-CM | POA: Diagnosis not present

## 2022-09-08 DIAGNOSIS — M170 Bilateral primary osteoarthritis of knee: Secondary | ICD-10-CM | POA: Diagnosis not present

## 2022-09-08 NOTE — Progress Notes (Unsigned)
  Subjective:  Patient ID: [name], female    DOB: [birth year],  MRN: [id number]  [name] presents to clinic today for {jgcomplaint:23593}  Chief Complaint  Patient presents with   Nail Problem    [facility] PCP-Vincent, Damita Dunnings PCP VST- 6 Months ago   New problem(s): None. {jgcomplaint:23593}  PCP is Axel Filler, MD.  Allergies  Allergen Reactions   Fluzone [place] Virus Vaccine] Other (See Comments)    Oculorespiratory syndrome with high dose Fluzone vaccine 2019   Sulfonamide Derivatives Shortness Of Breath   Penicillins Swelling and Other (See Comments)    REACTION: Local swelling in right arm after injection. Has patient had a PCN reaction causing immediate rash, facial/tongue/throat swelling, SOB or lightheadedness with hypotension:No Has patient had a PCN reaction causing severe rash involving mucus membranes or skin necrosis:No Has patient had a PCN reaction that required hospitalization:No Has patient had a PCN reaction occurring within the last 10 years:no If all of the above answers are "NO", then may proceed with Cephalosporin use.     Review of Systems: Negative except as noted in the HPI.  Objective: No changes noted in today's physical examination. Vitals:   09/08/22 1455  BP: (!) 157/79   [name] is a pleasant 82 y.o. female {jgbodyhabitus:24098} AAO x 3.  Neurovascular Examination: CFT <3 seconds b/l LE. Palpable DP pulse(s) b/l LE. Faintly palpable PT pulse(s) b/l LE. Pedal hair absent. No pain with calf compression b/l. No edema noted b/l LE. No cyanosis or clubbing noted b/l LE.  Protective sensation intact 5/5 intact bilaterally with 10g monofilament b/l.  Dermatological:  Pedal integument with normal turgor, texture and tone BLE. No open wounds b/l LE. No interdigital macerations noted b/l LE. Toenails  1-5 left, 2-5 right  elongated, discolored, dystrophic, thickened, and crumbly with subungual debris and tenderness to  dorsal palpation. Anonychia noted R hallux. Nailbed(s) epithelialized.   Hyperkeratotic lesion(s) distal tip of left 3rd toe.  No erythema, no edema, no drainage, no fluctuance.  Musculoskeletal:  Muscle strength 5/5 to all lower extremity muscle groups bilaterally. HAV with bunion deformity noted b/l LE. Hammertoe deformity noted 2-5 b/l. Assessment/Plan: No diagnosis found.  No orders of the defined types were placed in this encounter.   None {Jgplan:23602::"-Patient/POA to call should there be question/concern in the interim."}   Return in about 3 months (around 12/09/2022).  Marzetta Board, DPM

## 2022-10-08 ENCOUNTER — Other Ambulatory Visit: Payer: Self-pay | Admitting: Student in an Organized Health Care Education/Training Program

## 2022-10-09 DIAGNOSIS — M170 Bilateral primary osteoarthritis of knee: Secondary | ICD-10-CM | POA: Diagnosis not present

## 2022-10-25 ENCOUNTER — Other Ambulatory Visit: Payer: Self-pay | Admitting: Student in an Organized Health Care Education/Training Program

## 2022-10-26 ENCOUNTER — Telehealth: Payer: Self-pay | Admitting: Podiatry

## 2022-10-26 NOTE — Telephone Encounter (Signed)
Pts daughter called and the callus that was trimmed on 3.20 on the 3rd left toe is still not healed. Pt is having a hard time walking due to the pain. The pts daughter has been putting antibiotic ointment/ betadine on area but still painful and not healed. I did schedule pt to see Dr Ralene Cork tomorrow for her to check it. But wanted to make sure that is what Dr Eloy End would like as well.

## 2022-10-27 ENCOUNTER — Encounter: Payer: Self-pay | Admitting: Podiatry

## 2022-10-27 ENCOUNTER — Telehealth: Payer: Self-pay | Admitting: Podiatry

## 2022-10-27 ENCOUNTER — Ambulatory Visit (INDEPENDENT_AMBULATORY_CARE_PROVIDER_SITE_OTHER): Payer: 59

## 2022-10-27 ENCOUNTER — Ambulatory Visit (INDEPENDENT_AMBULATORY_CARE_PROVIDER_SITE_OTHER): Payer: 59 | Admitting: Podiatry

## 2022-10-27 DIAGNOSIS — G609 Hereditary and idiopathic neuropathy, unspecified: Secondary | ICD-10-CM | POA: Diagnosis not present

## 2022-10-27 DIAGNOSIS — L97524 Non-pressure chronic ulcer of other part of left foot with necrosis of bone: Secondary | ICD-10-CM

## 2022-10-27 DIAGNOSIS — M79671 Pain in right foot: Secondary | ICD-10-CM | POA: Diagnosis not present

## 2022-10-27 MED ORDER — DOXYCYCLINE HYCLATE 100 MG PO TABS: 100.0000 mg | ORAL_TABLET | Freq: Two times a day (BID) | ORAL | 0 refills | Status: AC

## 2022-10-27 NOTE — Progress Notes (Signed)
Subjective:  Patient ID: [name], female    DOB: 1941/05/10,   MRN: [id number]  Chief Complaint  Patient presents with   Foot Ulcer     Right foot second toe ulcer , painful no discharge on going for months     82 y.o. female presents for concern of left second toe ulcer that started several months ago. Relates she had it trimmed before and slowly been worsening. Relates she has been keeping neosporin and a bandaid on it. Relates it has been very painful and swollen.  . Denies any other pedal complaints. Denies n/v/f/c.   Past Medical History:  Diagnosis Date   Abnormal electromyogram (EMG)    Low grade right S1 radiculopathy 7/93.   Anemia 05/01/2014   Diastolic dysfunction    a. 08/2012 Echo: EF 60-65%, Gr 2 DD, triv MR, PASP .   Distal radius fracture, right 12/22/2015   DJD (degenerative joint disease)    Elevated alkaline phosphatase level    Bone scan 08/2004 showed no evidence of abnormal bony activity.[name]   Herniated lumbar intervertebral disc    S/P L4-5 & L5-S1 laminotomy, foraminotomy, and L5-S1 diskectomy by Dr. Trey Sailors 11/01/2000.   Hyperlipemia    Hypertension    Iron deficiency anemia    Osteopenia    A DEXA bone density scan done 02/13/2009 showed a left femur neck young adult T-score of -1.6 (osteopenia), a right femur neck young adult T-score of -1.6 (osteopenia), and an AP spine young adult T-score of 1.3 (normal).  Her FRAX score gave an estimated 10 year probability of 6.6% for major osteoporotic fracture and 0.8% for hip fracture.   Peripheral neuropathy    Rotator cuff syndrome of right shoulder    S/P right shoulder arthroscopic debridement of a massive rotator cuff tear, greater tuberosity and resection of subacromial spur by Dr. Gean Birchwood 07/07/2005.   Stroke [facility]) 04/2013   SVT (supraventricular tachycardia)    PSVT   Vitamin D deficiency     Objective:  Physical Exam: Vascular: DP/PT pulses 2/4 bilateral. CFT <3 seconds. Absent hair growth  on digits. Edema noted to bilateral lower extremities. Xerosis noted bilaterally.  Skin. No lacerations or abrasions bilateral feet. Nails 1-5 bilateral  are thickened discolored  with subungual debris. Hyperkeratotic area noted to distal second digit upon debridement 0.2 cm x 0.2cm x0.4 cm wound with granular base and probe to bone. No purulence or eryhtema but edema and discolroation noted to the second digit.  Musculoskeletal: MMT 5/5 bilateral lower extremities in DF, PF, Inversion and Eversion. Deceased ROM in DF of ankle joint.  Neurological: Sensation intact to light touch. Protective sensation diminished bilateral.    Assessment:   1. Ulcer of left second toe, with necrosis of bone (HCC)   2. Idiopathic peripheral neuropathy      Plan:  Patient was evaluated and treated and all questions answered. Ulcer left second digit with necrosis of bone  -Debridement as below. -X-rays reviewed and possible erosion to distal second digit concerning for osteomyelitis  -Dressed with betadine, DSD. -Off-loading with surgical shoe. Dispensed -Doxycycline sent to pharmacy.  Discussed concern for severity of infection in left second toe and possible infection in bone. Discussed getting MRI to further evaluate and patient may potentially need amputation.  MRI ordered.  ABIs ordered as well to rule out vascular issues.  -Discussed glucose control and proper protein-rich diet.  -Discussed if any worsening redness, pain, fever or chills to call or may need to  report to the emergency room. Patient expressed understanding.   Procedure: Excisional Debridement of Wound Rationale: Removal of non-viable soft tissue from the wound to promote healing.  Anesthesia: none Pre-Debridement Wound Measurements: Overlying callus  Post-Debridement Wound Measurements: 0.2 cm x 0.2 cm x 0.4 cm  Type of Debridement: Sharp Excisional Tissue Removed: Non-viable soft tissue Depth of Debridement: subcutaneous  tissue. Technique: Sharp excisional debridement to bleeding, viable wound base.  Dressing: Dry, sterile, compression dressing. Disposition: Patient tolerated procedure well. Patient to return in 1 week for follow-up.  Return in about 1 week (around 11/03/2022) for wound check.   Louann Sjogren, DPM

## 2022-10-27 NOTE — Telephone Encounter (Signed)
Daughter called in saying that her mother couldn't get an appointment until Sunday 5/26 for MRI  and wanted to know is that too long of a wait?  Please advise

## 2022-10-27 NOTE — Telephone Encounter (Signed)
For now we will see if that will work. I will see her next week and we will re-evaluate and if we need to bump it up we might have to go through the emergency deparment.

## 2022-11-03 ENCOUNTER — Encounter: Payer: Self-pay | Admitting: Podiatry

## 2022-11-03 ENCOUNTER — Ambulatory Visit (INDEPENDENT_AMBULATORY_CARE_PROVIDER_SITE_OTHER): Payer: 59 | Admitting: Podiatry

## 2022-11-03 DIAGNOSIS — G609 Hereditary and idiopathic neuropathy, unspecified: Secondary | ICD-10-CM | POA: Diagnosis not present

## 2022-11-03 DIAGNOSIS — L84 Corns and callosities: Secondary | ICD-10-CM | POA: Diagnosis not present

## 2022-11-03 NOTE — Progress Notes (Signed)
Subjective:  Patient ID: [name], female    DOB: [birth year],   MRN: [id number]  Chief Complaint  Patient presents with   Follow-up    Follow up on the left foot, 2nd digit, wound, no drainage, no pain    82 y.o. female presents for follow-up of left second toe wound. Relates she has been keeping neosporin and a bandaid on it. Relates some improvement. Has not had MRI done yet.  . Denies any other pedal complaints. Denies n/v/f/c.   Past Medical History:  Diagnosis Date   Abnormal electromyogram (EMG)    Low grade right S1 radiculopathy 7/93.   Anemia 05/01/2014   Diastolic dysfunction    a. 08/2012 Echo: EF 60-65%, Gr 2 DD, triv MR, PASP .   Distal radius fracture, right 12/22/2015   DJD (degenerative joint disease)    Elevated alkaline phosphatase level    Bone scan 08/2004 showed no evidence of abnormal bony activity.[name]   Herniated lumbar intervertebral disc    S/P L4-5 & L5-S1 laminotomy, foraminotomy, and L5-S1 diskectomy by Dr. Trey Sailors 11/01/2000.   Hyperlipemia    Hypertension    Iron deficiency anemia    Osteopenia    A DEXA bone density scan done 02/13/2009 showed a left femur neck young adult T-score of -1.6 (osteopenia), a right femur neck young adult T-score of -1.6 (osteopenia), and an AP spine young adult T-score of 1.3 (normal).  Her FRAX score gave an estimated 10 year probability of 6.6% for major osteoporotic fracture and 0.8% for hip fracture.   Peripheral neuropathy    Rotator cuff syndrome of right shoulder    S/P right shoulder arthroscopic debridement of a massive rotator cuff tear, greater tuberosity and resection of subacromial spur by Dr. Gean Birchwood 07/07/2005.   Stroke [facility]) 04/2013   SVT (supraventricular tachycardia)    PSVT   Vitamin D deficiency     Objective:  Physical Exam: Vascular: DP/PT pulses 2/4 bilateral. CFT <3 seconds. Absent hair growth on digits. Edema noted to bilateral lower extremities. Xerosis noted bilaterally.  Skin. No  lacerations or abrasions bilateral feet. Nails 1-5 bilateral  are thickened discolored  with subungual debris. Hyperkeratotic area noted to distal second digit upon debridement ulceration healed. No purulence or eryhtema but edema and discolroation noted to the second digit but has improved.  Musculoskeletal: MMT 5/5 bilateral lower extremities in DF, PF, Inversion and Eversion. Deceased ROM in DF of ankle joint.  Neurological: Sensation intact to light touch. Protective sensation diminished bilateral.    Assessment:   1. Pre-ulcerative calluses   2. Idiopathic peripheral neuropathy       Plan:  Patient was evaluated and treated and all questions answered. Ulcer left second digit -healed. Concern for possible bone infection however. Awaiting MRI to rule out.  -Debridement of hyperkeratotic tissue with underlying ulceration healed.  -X-rays reviewed and possible erosion to distal second digit concerning for osteomyelitis  -Dressed with betadine, DSD. Advised to keep covered for protection.  -Off-loading with surgical shoe.  -Finish out antibiotics.  Awaiting ABIs  ABIs ordered as well to rule out vascular issues.  -Discussed glucose control and proper protein-rich diet.  -Discussed if any worsening redness, pain, fever or chills to call or may need to report to the emergency room. Patient expressed understanding.   Follow-up in 2 weeks for recheck and to go over MRI and vascular studies.   Return in about 2 weeks (around 11/17/2022) for wound check.   Louann Sjogren, DPM

## 2022-11-08 ENCOUNTER — Ambulatory Visit (HOSPITAL_COMMUNITY)
Admission: RE | Admit: 2022-11-08 | Discharge: 2022-11-08 | Disposition: A | Discharge status: Home / Self Care | Payer: 59 | Source: Ambulatory Visit | Attending: Podiatry | Admitting: Podiatry

## 2022-11-08 DIAGNOSIS — L97524 Non-pressure chronic ulcer of other part of left foot with necrosis of bone: Secondary | ICD-10-CM | POA: Diagnosis not present

## 2022-11-08 DIAGNOSIS — G609 Hereditary and idiopathic neuropathy, unspecified: Secondary | ICD-10-CM | POA: Diagnosis present

## 2022-11-08 DIAGNOSIS — M170 Bilateral primary osteoarthritis of knee: Secondary | ICD-10-CM | POA: Diagnosis not present

## 2022-11-08 LAB — VAS US ABI WITH/WO TBI
Left ABI: 1.13
Right ABI: 1.18

## 2022-11-14 ENCOUNTER — Ambulatory Visit
Admission: RE | Admit: 2022-11-14 | Discharge: 2022-11-14 | Disposition: A | Discharge status: Home / Self Care | Payer: 59 | Source: Ambulatory Visit | Attending: Podiatry | Admitting: Podiatry

## 2022-11-14 DIAGNOSIS — G609 Hereditary and idiopathic neuropathy, unspecified: Secondary | ICD-10-CM

## 2022-11-14 DIAGNOSIS — R2232 Localized swelling, mass and lump, left upper limb: Secondary | ICD-10-CM | POA: Diagnosis not present

## 2022-11-17 ENCOUNTER — Encounter: Payer: Self-pay | Admitting: Podiatry

## 2022-11-17 ENCOUNTER — Ambulatory Visit (INDEPENDENT_AMBULATORY_CARE_PROVIDER_SITE_OTHER): Payer: 59 | Admitting: Podiatry

## 2022-11-17 DIAGNOSIS — L84 Corns and callosities: Secondary | ICD-10-CM | POA: Diagnosis not present

## 2022-11-17 DIAGNOSIS — Z87898 Personal history of other specified conditions: Secondary | ICD-10-CM

## 2022-11-17 DIAGNOSIS — B351 Tinea unguium: Secondary | ICD-10-CM | POA: Diagnosis not present

## 2022-11-17 DIAGNOSIS — G609 Hereditary and idiopathic neuropathy, unspecified: Secondary | ICD-10-CM

## 2022-11-17 DIAGNOSIS — M79675 Pain in left toe(s): Secondary | ICD-10-CM | POA: Diagnosis not present

## 2022-11-17 DIAGNOSIS — M79674 Pain in right toe(s): Secondary | ICD-10-CM

## 2022-11-17 NOTE — Progress Notes (Signed)
Subjective:  Patient ID: [name], female    DOB: 1940/09/02,   MRN: [id number]  No chief complaint on file.   82 y.o. female presents for follow-up of left second toe wound. Relates continued to do well and no issues. Here to review MRI.  [name] Denies any other pedal complaints. Denies n/v/f/c.   Past Medical History:  Diagnosis Date   Abnormal electromyogram (EMG)    Low grade right S1 radiculopathy 7/93.   Anemia 05/01/2014   Diastolic dysfunction    a. 08/2012 Echo: EF 60-65%, Gr 2 DD, triv MR, PASP .   Distal radius fracture, right 12/22/2015   DJD (degenerative joint disease)    Elevated alkaline phosphatase level    Bone scan 08/2004 showed no evidence of abnormal bony activity.[name]   Herniated lumbar intervertebral disc    S/P L4-5 & L5-S1 laminotomy, foraminotomy, and L5-S1 diskectomy by Dr. Trey Sailors 11/01/2000.   Hyperlipemia    Hypertension    Iron deficiency anemia    Osteopenia    A DEXA bone density scan done 02/13/2009 showed a left femur neck young adult T-score of -1.6 (osteopenia), a right femur neck young adult T-score of -1.6 (osteopenia), and an AP spine young adult T-score of 1.3 (normal).  Her FRAX score gave an estimated 10 year probability of 6.6% for major osteoporotic fracture and 0.8% for hip fracture.   Peripheral neuropathy    Rotator cuff syndrome of right shoulder    S/P right shoulder arthroscopic debridement of a massive rotator cuff tear, greater tuberosity and resection of subacromial spur by Dr. Gean Birchwood 07/07/2005.   Stroke [facility]) 04/2013   SVT (supraventricular tachycardia)    PSVT   Vitamin D deficiency     Objective:  Physical Exam: Vascular: DP/PT pulses 2/4 bilateral. CFT <3 seconds. Absent hair growth on digits. Edema noted to bilateral lower extremities. Xerosis noted bilaterally.  Skin. No lacerations or abrasions bilateral feet. Nails 1-5 bilateral  are thickened discolored  with subungual debris. Hyperkeratotic area noted to  distal second digit upon debridement ulceration healed. No purulence or eryhtema but edema and discolroation noted to the second digit but has improved.  Musculoskeletal: MMT 5/5 bilateral lower extremities in DF, PF, Inversion and Eversion. Deceased ROM in DF of ankle joint.  Neurological: Sensation intact to light touch. Protective sensation diminished bilateral.   +---------+------------------+-----+--------+--------+  Right   Rt Pressure (mmHg)IndexWaveformComment   +---------+------------------+-----+--------+--------+  Brachial 153                                      +---------+------------------+-----+--------+--------+  PTA     181               1.18 biphasic          +---------+------------------+-----+--------+--------+  DP      175               1.14 biphasic          +---------+------------------+-----+--------+--------+  Great Toe125               0.82 Normal            +---------+------------------+-----+--------+--------+   +---------+------------------+-----+---------+-------+  Left    Lt Pressure (mmHg)IndexWaveform Comment  +---------+------------------+-----+---------+-------+  Brachial 150                                      +---------+------------------+-----+---------+-------+  PTA     162               1.06 triphasic         +---------+------------------+-----+---------+-------+  DP      173               1.13 triphasic         +---------+------------------+-----+---------+-------+  Great Toe108               0.71 Normal            +---------+------------------+-----+---------+-------+   +-------+-----------+-----------+------------+------------+  ABI/TBIToday's ABIToday's TBIPrevious ABIPrevious TBI  +-------+-----------+-----------+------------+------------+  Right 1.18       0.82                                 +-------+-----------+-----------+------------+------------+  Left  1.13        0.71                                 +-------+-----------+-----------+------------+------------+           Summary:  Right: Resting right ankle-brachial index is within normal range. The  right toe-brachial index is normal.    Left: Resting left ankle-brachial index is within normal range. The left  toe-brachial index is normal.   MRI left foot  IMPRESSION: 1. Bone marrow edema of the distal phalanx of the second digit concerning for acute osteomyelitis. 2. Skin irregularity and edema about the tip of the second digit consistent with ulceration. 3. Increased signal of the plantar muscles which may be secondary to muscle strain or myositis. 4. Chronic fractures of the second, third and fourth metatarsal necks. 5. Soft tissue edema about the dorsum of the foot without evidence of fluid collection or abscess.    Assessment:   1. Pre-ulcerative calluses   2. Idiopathic peripheral neuropathy        Plan:  Patient was evaluated and treated and all questions answered. Ulcer left second digit -healed.  MRI reivewed with patient. MRI showing some edema in bone that coule be concerning however clinically she continues to improve. At this time suggest continued monitoring. Discussed she could still be at risk for limb loss however.  -Debridement of hyperkeratotic tissue with underlying ulceration healed.  -X-rays reviewed and possible erosion to distal second digit concerning for osteomyelitis  -Dressed with betadine, DSD. Advised to keep covered for protection.  -Off-loading with surgical shoe.  -ABIs normal range.  -Discussed and educated patient on diabetic foot care, especially with  regards to the vascular, neurological and musculoskeletal systems.  -Stressed the importance of good glycemic control and the detriment of not  controlling glucose levels in relation to the foot. -Discussed supportive shoes at all times and checking feet regularly.  -Mechanically  debrided all nails 1-5 bilateral using sterile nail nipper and filed with dremel without incident -Discussed glucose control and proper protein-rich diet.  -Discussed if any worsening redness, pain, fever or chills to call or may need to report to the emergency room. Patient expressed understanding.   Follow-up in 4 weeks for recheck  No follow-ups on file.   Louann Sjogren, DPM

## 2022-12-09 DIAGNOSIS — M170 Bilateral primary osteoarthritis of knee: Secondary | ICD-10-CM | POA: Diagnosis not present

## 2022-12-20 ENCOUNTER — Encounter: Payer: Self-pay | Admitting: Podiatry

## 2022-12-20 ENCOUNTER — Ambulatory Visit (INDEPENDENT_AMBULATORY_CARE_PROVIDER_SITE_OTHER): Payer: 59 | Admitting: Podiatry

## 2022-12-20 DIAGNOSIS — L84 Corns and callosities: Secondary | ICD-10-CM

## 2022-12-20 DIAGNOSIS — G609 Hereditary and idiopathic neuropathy, unspecified: Secondary | ICD-10-CM | POA: Diagnosis not present

## 2022-12-20 NOTE — Progress Notes (Signed)
Subjective:  Patient ID: [name], female    DOB: [birth year],   MRN: [id number]  No chief complaint on file.   82 y.o. female presents for follow-up of left second toe wound. Relates continued to do well and no issues.   . Denies any other pedal complaints. Denies n/v/f/c.   Past Medical History:  Diagnosis Date   Abnormal electromyogram (EMG)    Low grade right S1 radiculopathy 7/93.   Anemia 05/01/2014   Diastolic dysfunction    a. 08/2012 Echo: EF 60-65%, Gr 2 DD, triv MR, PASP .   Distal radius fracture, right 12/22/2015   DJD (degenerative joint disease)    Elevated alkaline phosphatase level    Bone scan 08/2004 showed no evidence of abnormal bony activity.[name]   Herniated lumbar intervertebral disc    S/P L4-5 & L5-S1 laminotomy, foraminotomy, and L5-S1 diskectomy by Dr. Trey Sailors 11/01/2000.   Hyperlipemia    Hypertension    Iron deficiency anemia    Osteopenia    A DEXA bone density scan done 02/13/2009 showed a left femur neck young adult T-score of -1.6 (osteopenia), a right femur neck young adult T-score of -1.6 (osteopenia), and an AP spine young adult T-score of 1.3 (normal).  Her FRAX score gave an estimated 10 year probability of 6.6% for major osteoporotic fracture and 0.8% for hip fracture.   Peripheral neuropathy    Rotator cuff syndrome of right shoulder    S/P right shoulder arthroscopic debridement of a massive rotator cuff tear, greater tuberosity and resection of subacromial spur by Dr. Gean Birchwood 07/07/2005.   Stroke [facility]) 04/2013   SVT (supraventricular tachycardia)    PSVT   Vitamin D deficiency     Objective:  Physical Exam: Vascular: DP/PT pulses 2/4 bilateral. CFT <3 seconds. Absent hair growth on digits. Edema noted to bilateral lower extremities. Xerosis noted bilaterally.  Skin. No lacerations or abrasions bilateral feet. Nails 1-5 bilateral  are thickened discolored  with subungual debris. Hyperkeratotic area noted to distal second digit  upon debridement ulceration healed. No purulence or eryhtema but edema and discolroation noted to the second digit but has improved. Maren Reamer still present but significantly improved.  Musculoskeletal: MMT 5/5 bilateral lower extremities in DF, PF, Inversion and Eversion. Deceased ROM in DF of ankle joint.  Neurological: Sensation intact to light touch. Protective sensation diminished bilateral.   +---------+------------------+-----+--------+--------+  Right   Rt Pressure (mmHg)IndexWaveformComment   +---------+------------------+-----+--------+--------+  Brachial 153                                      +---------+------------------+-----+--------+--------+  PTA     181               1.18 biphasic          +---------+------------------+-----+--------+--------+  DP      175               1.14 biphasic          +---------+------------------+-----+--------+--------+  Great Toe125               0.82 Normal            +---------+------------------+-----+--------+--------+   +---------+------------------+-----+---------+-------+  Left    Lt Pressure (mmHg)IndexWaveform Comment  +---------+------------------+-----+---------+-------+  Brachial 150                                      +---------+------------------+-----+---------+-------+  PTA     162               1.06 triphasic         +---------+------------------+-----+---------+-------+  DP      173               1.13 triphasic         +---------+------------------+-----+---------+-------+  Great Toe108               0.71 Normal            +---------+------------------+-----+---------+-------+   +-------+-----------+-----------+------------+------------+  ABI/TBIToday's ABIToday's TBIPrevious ABIPrevious TBI  +-------+-----------+-----------+------------+------------+  Right 1.18       0.82                                  +-------+-----------+-----------+------------+------------+  Left  1.13       0.71                                 +-------+-----------+-----------+------------+------------+           Summary:  Right: Resting right ankle-brachial index is within normal range. The  right toe-brachial index is normal.    Left: Resting left ankle-brachial index is within normal range. The left  toe-brachial index is normal.   MRI left foot  IMPRESSION: 1. Bone marrow edema of the distal phalanx of the second digit concerning for acute osteomyelitis. 2. Skin irregularity and edema about the tip of the second digit consistent with ulceration. 3. Increased signal of the plantar muscles which may be secondary to muscle strain or myositis. 4. Chronic fractures of the second, third and fourth metatarsal necks. 5. Soft tissue edema about the dorsum of the foot without evidence of fluid collection or abscess.    Assessment:   1. Pre-ulcerative calluses   2. Idiopathic peripheral neuropathy         Plan:  Patient was evaluated and treated and all questions answered. MRI reivewed with patient. MRI showing some edema in bone that coule be concerning however clinically she continues to improve. At this time suggest continued monitoring. Discussed she could still be at risk for limb loss however.  -Debridement of hyperkeratotic tissue with underlying ulceration healed.  -ABIs normal range.  -Discussed and educated patient on diabetic foot care, especially with  regards to the vascular, neurological and musculoskeletal systems.  -Stressed the importance of good glycemic control and the detriment of not  controlling glucose levels in relation to the foot. -Discussed supportive shoes at all times and checking feet regularly.  Return in 3 months for rfc.     Return in about 3 months (around 03/22/2023) for rfc.   Louann Sjogren, DPM

## 2023-01-08 DIAGNOSIS — M170 Bilateral primary osteoarthritis of knee: Secondary | ICD-10-CM | POA: Diagnosis not present

## 2023-01-19 ENCOUNTER — Ambulatory Visit: Payer: 59 | Admitting: Podiatry

## 2023-01-31 ENCOUNTER — Encounter: Payer: Self-pay | Admitting: Student in an Organized Health Care Education/Training Program

## 2023-01-31 ENCOUNTER — Ambulatory Visit (INDEPENDENT_AMBULATORY_CARE_PROVIDER_SITE_OTHER): Payer: 59 | Admitting: Student in an Organized Health Care Education/Training Program

## 2023-01-31 VITALS — BP 154/89 | HR 65 | Temp 98.2°F | Ht 67.0 in | Wt 179.3 lb

## 2023-01-31 DIAGNOSIS — E785 Hyperlipidemia, unspecified: Secondary | ICD-10-CM

## 2023-01-31 DIAGNOSIS — R600 Localized edema: Secondary | ICD-10-CM

## 2023-01-31 DIAGNOSIS — N1831 Chronic kidney disease, stage 3a: Secondary | ICD-10-CM

## 2023-01-31 DIAGNOSIS — Z9181 History of falling: Secondary | ICD-10-CM

## 2023-01-31 DIAGNOSIS — I129 Hypertensive chronic kidney disease with stage 1 through stage 4 chronic kidney disease, or unspecified chronic kidney disease: Secondary | ICD-10-CM | POA: Diagnosis not present

## 2023-01-31 DIAGNOSIS — I10 Essential (primary) hypertension: Secondary | ICD-10-CM

## 2023-01-31 DIAGNOSIS — Z87891 Personal history of nicotine dependence: Secondary | ICD-10-CM | POA: Diagnosis not present

## 2023-01-31 NOTE — Assessment & Plan Note (Addendum)
[name] has chronic bilateral leg edema and now has developed chronic stasis dermatitis. In February, she stopped taking Furosemide due to having nocturnal enuresis. She tried foot cream recommended by her foot care team for her "rash", however it got worse and she stopped using the cream. It has gotten better since then. She has continued to wear her compression stockings and uses her walker to move around the house. She denied drainage, bleeding, burning, pain, or itching of her legs. She denies n/v/c/f, chest pain or dizziness. On physical exam, there is bilateral edema with fibrotic changes, and scaling. [name] bilateral skin changes are likely due to chronic stasis and edema causing fibrotic changes in her skin. She is willing to restart her furosemide. Advised to use 2 tablets of 40 mg furosemide in the mornings to avoid nocturnal enuresis.  - Restart furosemide 40mg , 2 tablets (total 80 mg) in morning  - f/u in 3 months

## 2023-01-31 NOTE — Progress Notes (Signed)
Subjective:   Patient ID: [name] female   DOB: 08-Feb-1941 82 y.o.   MRN: [id number]  HPI: [name] is a 82 y.o. female with PMH of chronic bilateral leg edema, CKD, hypertension, hyperlipidemia and OA presenting to clinic with chronic stasis dermatitis. She is accompanied by her daughter [name]. Please see problem based assessment and plan for details of the visit.     Past Medical History:  Diagnosis Date   Abnormal electromyogram (EMG)    Low grade right S1 radiculopathy 7/93.   Anemia 05/01/2014   Diastolic dysfunction    a. 08/2012 Echo: EF 60-65%, Gr 2 DD, triv MR, PASP .   Distal radius fracture, right 12/22/2015   DJD (degenerative joint disease)    Elevated alkaline phosphatase level    Bone scan 08/2004 showed no evidence of abnormal bony activity.[name]   Herniated lumbar intervertebral disc    S/P L4-5 & L5-S1 laminotomy, foraminotomy, and L5-S1 diskectomy by Dr. Trey Sailors 11/01/2000.   Hyperlipemia    Hypertension    Iron deficiency anemia    Osteopenia    A DEXA bone density scan done 02/13/2009 showed a left femur neck young adult T-score of -1.6 (osteopenia), a right femur neck young adult T-score of -1.6 (osteopenia), and an AP spine young adult T-score of 1.3 (normal).  Her FRAX score gave an estimated 10 year probability of 6.6% for major osteoporotic fracture and 0.8% for hip fracture.   Peripheral neuropathy    Rotator cuff syndrome of right shoulder    S/P right shoulder arthroscopic debridement of a massive rotator cuff tear, greater tuberosity and resection of subacromial spur by Dr. Gean Birchwood 07/07/2005.   Stroke [facility]) 04/2013   SVT (supraventricular tachycardia)    PSVT   Vitamin D deficiency    Current Outpatient Medications  Medication Sig Dispense Refill   atenolol (TENORMIN) 50 MG tablet Take 1 tablet (50 mg total) by mouth daily. 90 tablet 3   atorvastatin (LIPITOR) 80 MG tablet TAKE 1 TABLET BY MOUTH EVERY DAY AT 6PM 90 tablet 2    Cholecalciferol (VITAMIN D3) 50 MCG (2000 UT) capsule TAKE 1 CAPSULE BY MOUTH EVERY DAY 90 capsule 1   clopidogrel (PLAVIX) 75 MG tablet TAKE 1 TABLET BY MOUTH EVERY DAY 90 tablet 3   diclofenac Sodium (VOLTAREN) 1 % GEL Apply 2 g topically 4 (four) times daily as needed. 100 g 0   fluticasone (FLONASE) 50 MCG/ACT nasal spray SPRAY 1 SPRAY INTO BOTH NOSTRILS DAILY. 16 mL 2   furosemide (LASIX) 40 MG tablet Take 1 tablet (40 mg total) by mouth daily as needed for edema. 90 tablet 3   lidocaine (XYLOCAINE) 5 % ointment Apply 1 Application topically as needed. 35.44 g 0   lisinopril (ZESTRIL) 20 MG tablet TAKE 1 TABLET BY MOUTH EVERY DAY 90 tablet 3   meloxicam (MOBIC) 7.5 MG tablet TAKE 1 TABLET (7.5 MG TOTAL) BY MOUTH DAILY AS NEEDED FOR PAIN (NOT TO EXCEED TWO DOSES PER WEEK). 30 tablet 2   No current facility-administered medications for this visit.   Family History  Problem Relation Age of Onset   Ovarian cancer Mother 64   Hypertension Mother    Heart attack Mother 42   Emphysema Father    Hypertension Sister    Stroke Sister    Hypertension Sister    Cancer Brother    Breast cancer Neg Hx    Colon cancer Neg Hx    Lung cancer Neg  Hx    Social History   Socioeconomic History   Marital status: Single    Spouse name: Not on file   Number of children: 6   Years of education: 12th   Highest education level: Not on file  Occupational History   Occupation: retired  Tobacco Use   Smoking status: Former    Current packs/day: 0.00    Average packs/day: 0.2 packs/day for 2.0 years (0.3 ttl pk-yrs)    Types: Cigarettes    Start date: 10/06/1997    Quit date: 10/07/1999    Years since quitting: 23.3   Smokeless tobacco: Never  Substance and Sexual Activity   Alcohol use: No    Alcohol/week: 0.0 standard drinks of alcohol   Drug use: No   Sexual activity: Not Currently  Other Topics Concern   Not on file  Social History Narrative   Current Social History 06/29/2021         Patient lives with daughter in an apartment which is 1 story. There are not steps up to the entrance the patient uses.       Patient's method of transportation is via family member.      The highest level of education was high school diploma.      The patient currently disabled.      Identified important Relationships are "my oldest son and my daughter that I live with"       Pets : 0       Interests / Fun: "I don't do a lot of things"       Current Stressors: "none"       Religious / Personal Beliefs: "none"       Social Determinants of Health   Financial Resource Strain: Not on file  Food Insecurity: Not on file  Transportation Needs: Not on file  Physical Activity: Not on file  Stress: Not on file  Social Connections: Not on file   Review of Systems: ROS negative except for what is noted on the assessment and plan.  Objective:  Physical Exam: Vitals:   01/31/23 0821 01/31/23 0840  BP: (!) 156/79 (!) 154/89  Pulse: 67 65  Temp: 98.2 F (36.8 C)   TempSrc: Oral   SpO2: 100%   Weight: 179 lb 4.8 oz (81.3 kg)   Height: 5\' 7"  (1.702 m)     General appearance: well appearing Eyes:  tracking appropriately Neck: Trachea midline Lungs: CTAB, no crackles, no wheeze, with normal respiratory effort and no intercostal retractions CV: RRR, S1, S2, no MRGs  Extremities: bilateral edema, fibrotic scaling on both legs, DP pulses present bilaterally  Skin: Normal temperature, turgor and texture; no rash Psych: Appropriate affect Neuro: Alert and oriented to person and place, no focal deficit   Assessment & Plan:  Bilateral leg edema due to diastolic dysfunction [name] has chronic bilateral leg edema and now has developed chronic stasis dermatitis. In February, she stopped taking Furosemide due to having nocturnal enuresis. She tried foot cream recommended by her foot care team for her "rash", however it got worse and she stopped using the cream. It has gotten better  since then. She has continued to wear her compression stockings and uses her walker to move around the house. She denied drainage, bleeding, burning, pain, or itching of her legs. She denies n/v/c/f, chest pain or dizziness. On physical exam, there is bilateral edema with fibrotic changes, and scaling. [name] bilateral skin changes are likely due to chronic stasis and  edema causing fibrotic changes in her skin. She is willing to restart her furosemide. Advised to use 2 tablets of 40 mg furosemide in the mornings to avoid nocturnal enuresis.  - Restart furosemide 40mg , 2 tablets (total 80 mg) in morning  - f/u in 3 months   Essential hypertension [name] is taking lisinopril 20 mg daily for her chronic hypertension. She denies any chest pain, blurry vision, dizziness, headaches or shortness of blood. Blood pressure not at goal. Her goal should be <120/80. However, given her frailty and her restarting furosemide today for her edema, will reassess in 3 months to see benefits of adding a second anti-htn medication.  - continue 20 mg lisinopril daily - f/u in 3 months     Hyperlipidemia - Chronic and stable.  -Continue taking atorvastatin 80 mg and clopidogrel 75 mg.   Stage 3a chronic kidney disease (CKD) (HCC) Chronic and stable.  - Continue lisinopril 20 mg   Patient discussed with Dr. Oswaldo Done

## 2023-01-31 NOTE — Assessment & Plan Note (Addendum)
[name] is taking lisinopril 20 mg daily for her chronic hypertension. She denies any chest pain, blurry vision, dizziness, headaches or shortness of blood. Blood pressure not at goal. Her goal should be <120/80. However, given her frailty and her restarting furosemide today for her edema, will reassess in 3 months to see benefits of adding a second anti-htn medication.  - continue 20 mg lisinopril daily - f/u in 3 months

## 2023-01-31 NOTE — Assessment & Plan Note (Signed)
Chronic and stable.  - Continue lisinopril 20 mg

## 2023-01-31 NOTE — Progress Notes (Signed)
Attestation for Student Documentation:  I personally was present and re-performed the history, physical exam and medical decision-making activities of this service and have verified that the service and findings are accurately documented in the student's note.  82 year old person here for management of hypertension and with an acute concern of increasing swelling and skin changes in both legs.  On exam she has bilateral lipodermatosclerosis with chronic stasis changes.  No signs of cellulitis and low risk for DVT based on my exam and the chronicity of this problem.  This coincides with the patient discontinuing furosemide due to nocturia.  Lower extremity edema is multifactorial but probably mostly related to venous insufficiency and sedentary lifestyle.  At this point she is ambulating very little outside of the house.  We talked about supportive care including compression, elevation, and ambulation as much as possible.  Also recommended restarting Lasix given the degree of skin changes, there is risk for skin breakdown if the swelling worsens.  We talked about taking the diuretic early in the morning which should not contribute to nocturia.  Blood pressure also elevated above goal today.  Given her frailty and because we are starting a loop diuretic, I am going to hold off on starting another antihypertensive.  Will follow-up with this closely in 3 months.  Perhaps switching to a thiazide diuretic once she is more euvolemic may help manage blood pressure and lower extremity edema.  Tyson Alias, MD 01/31/2023, 1:23 PM

## 2023-01-31 NOTE — Assessment & Plan Note (Addendum)
-   Chronic and stable.  -Continue taking atorvastatin 80 mg and clopidogrel 75 mg.

## 2023-02-08 DIAGNOSIS — M170 Bilateral primary osteoarthritis of knee: Secondary | ICD-10-CM | POA: Diagnosis not present

## 2023-02-23 ENCOUNTER — Ambulatory Visit: Payer: 59 | Admitting: Podiatry

## 2023-03-09 ENCOUNTER — Ambulatory Visit (INDEPENDENT_AMBULATORY_CARE_PROVIDER_SITE_OTHER): Payer: 59 | Admitting: Podiatry

## 2023-03-09 DIAGNOSIS — M79674 Pain in right toe(s): Secondary | ICD-10-CM | POA: Diagnosis not present

## 2023-03-09 DIAGNOSIS — B351 Tinea unguium: Secondary | ICD-10-CM | POA: Diagnosis not present

## 2023-03-09 DIAGNOSIS — G609 Hereditary and idiopathic neuropathy, unspecified: Secondary | ICD-10-CM

## 2023-03-09 DIAGNOSIS — M79675 Pain in left toe(s): Secondary | ICD-10-CM | POA: Diagnosis not present

## 2023-03-09 NOTE — Progress Notes (Signed)
Subjective:  Patient ID: [name], female    DOB: [birth year],   MRN: [id number]  Chief Complaint  Patient presents with   routine foot care    82 y.o. female presents for concern of thickened elongated and painful nails that are difficult to trim. Requesting to have them trimmed today.   [name]   PCP:  Tyson Alias, MD     . Denies any other pedal complaints. Denies n/v/f/c.   Past Medical History:  Diagnosis Date   Abnormal electromyogram (EMG)    Low grade right S1 radiculopathy 7/93.   Anemia 05/01/2014   Diastolic dysfunction    a. 08/2012 Echo: EF 60-65%, Gr 2 DD, triv MR, PASP .   Distal radius fracture, right 12/22/2015   DJD (degenerative joint disease)    Elevated alkaline phosphatase level    Bone scan 08/2004 showed no evidence of abnormal bony activity.[name]   Herniated lumbar intervertebral disc    S/P L4-5 & L5-S1 laminotomy, foraminotomy, and L5-S1 diskectomy by Dr. Trey Sailors 11/01/2000.   Hyperlipemia    Hypertension    Iron deficiency anemia    Osteopenia    A DEXA bone density scan done 02/13/2009 showed a left femur neck young adult T-score of -1.6 (osteopenia), a right femur neck young adult T-score of -1.6 (osteopenia), and an AP spine young adult T-score of 1.3 (normal).  Her FRAX score gave an estimated 10 year probability of 6.6% for major osteoporotic fracture and 0.8% for hip fracture.   Peripheral neuropathy    Rotator cuff syndrome of right shoulder    S/P right shoulder arthroscopic debridement of a massive rotator cuff tear, greater tuberosity and resection of subacromial spur by Dr. Gean Birchwood 07/07/2005.   Stroke [facility]) 04/2013   SVT (supraventricular tachycardia)    PSVT   Vitamin D deficiency     Objective:  Physical Exam: Vascular: DP/PT pulses 2/4 bilateral. CFT <3 seconds. Absent hair growth on digits. Edema noted to bilateral lower extremities. Xerosis noted bilaterally.  Skin. No lacerations or abrasions bilateral feet. Nails  1-5 bilateral  are thickened discolored  with subungual debris. Hyperkeratotic area noted to distal second digit  healed.  No purulence or eryhtema but edema and discolroation noted to the second digit but has improved. Maren Reamer still present but significantly improved.  Musculoskeletal: MMT 5/5 bilateral lower extremities in DF, PF, Inversion and Eversion. Deceased ROM in DF of ankle joint.  Neurological: Sensation intact to light touch. Protective sensation diminished bilateral.   +---------+------------------+-----+--------+--------+  Right   Rt Pressure (mmHg)IndexWaveformComment   +---------+------------------+-----+--------+--------+  Brachial 153                                      +---------+------------------+-----+--------+--------+  PTA     181               1.18 biphasic          +---------+------------------+-----+--------+--------+  DP      175               1.14 biphasic          +---------+------------------+-----+--------+--------+  Great Toe125               0.82 Normal            +---------+------------------+-----+--------+--------+   +---------+------------------+-----+---------+-------+  Left    Lt Pressure (mmHg)IndexWaveform Comment  +---------+------------------+-----+---------+-------+  Brachial 150                                      +---------+------------------+-----+---------+-------+  PTA     162               1.06 triphasic         +---------+------------------+-----+---------+-------+  DP      173               1.13 triphasic         +---------+------------------+-----+---------+-------+  Great Toe108               0.71 Normal            +---------+------------------+-----+---------+-------+   +-------+-----------+-----------+------------+------------+  ABI/TBIToday's ABIToday's TBIPrevious ABIPrevious TBI  +-------+-----------+-----------+------------+------------+  Right 1.18        0.82                                 +-------+-----------+-----------+------------+------------+  Left  1.13       0.71                                 +-------+-----------+-----------+------------+------------+           Summary:  Right: Resting right ankle-brachial index is within normal range. The  right toe-brachial index is normal.    Left: Resting left ankle-brachial index is within normal range. The left  toe-brachial index is normal.   MRI left foot  IMPRESSION: 1. Bone marrow edema of the distal phalanx of the second digit concerning for acute osteomyelitis. 2. Skin irregularity and edema about the tip of the second digit consistent with ulceration. 3. Increased signal of the plantar muscles which may be secondary to muscle strain or myositis. 4. Chronic fractures of the second, third and fourth metatarsal necks. 5. Soft tissue edema about the dorsum of the foot without evidence of fluid collection or abscess.    Assessment:   1. Pain due to onychomycosis of toenails of both feet   2. Idiopathic peripheral neuropathy         Plan:  Patient was evaluated and treated and all questions answered. -Discussed and educated patient on  foot care, especially with  regards to the vascular, neurological and musculoskeletal systems.  -Stressed the importance of good glycemic control and the detriment of not  controlling glucose levels in relation to the foot. -Discussed supportive shoes at all times and checking feet regularly.  -Mechanically debrided all nails 1-5 bilateral using sterile nail nipper and filed with dremel without incident  -Answered all patient questions -Patient to return  in 3 months for at risk foot care -Patient advised to call the office if any problems or questions arise in the meantime.     No follow-ups on file.   Louann Sjogren, DPM

## 2023-03-10 ENCOUNTER — Telehealth: Payer: Self-pay | Admitting: *Deleted

## 2023-03-10 ENCOUNTER — Emergency Department (HOSPITAL_BASED_OUTPATIENT_CLINIC_OR_DEPARTMENT_OTHER): Payer: 59 | Admitting: Radiology

## 2023-03-10 ENCOUNTER — Encounter (HOSPITAL_BASED_OUTPATIENT_CLINIC_OR_DEPARTMENT_OTHER): Payer: Self-pay | Admitting: Emergency Medicine

## 2023-03-10 ENCOUNTER — Other Ambulatory Visit: Payer: Self-pay

## 2023-03-10 ENCOUNTER — Emergency Department (HOSPITAL_BASED_OUTPATIENT_CLINIC_OR_DEPARTMENT_OTHER)
Admission: EM | Admit: 2023-03-10 | Discharge: 2023-03-11 | Disposition: A | Discharge status: Home / Self Care | Payer: 59 | Attending: Emergency Medicine | Admitting: Emergency Medicine

## 2023-03-10 ENCOUNTER — Emergency Department (HOSPITAL_BASED_OUTPATIENT_CLINIC_OR_DEPARTMENT_OTHER): Payer: 59

## 2023-03-10 DIAGNOSIS — M48061 Spinal stenosis, lumbar region without neurogenic claudication: Secondary | ICD-10-CM | POA: Diagnosis not present

## 2023-03-10 DIAGNOSIS — M47816 Spondylosis without myelopathy or radiculopathy, lumbar region: Secondary | ICD-10-CM | POA: Insufficient documentation

## 2023-03-10 DIAGNOSIS — W01198A Fall on same level from slipping, tripping and stumbling with subsequent striking against other object, initial encounter: Secondary | ICD-10-CM | POA: Diagnosis not present

## 2023-03-10 DIAGNOSIS — Z7902 Long term (current) use of antithrombotics/antiplatelets: Secondary | ICD-10-CM | POA: Diagnosis not present

## 2023-03-10 DIAGNOSIS — S32010A Wedge compression fracture of first lumbar vertebra, initial encounter for closed fracture: Secondary | ICD-10-CM | POA: Diagnosis not present

## 2023-03-10 DIAGNOSIS — I700 Atherosclerosis of aorta: Secondary | ICD-10-CM | POA: Diagnosis not present

## 2023-03-10 DIAGNOSIS — M5136 Other intervertebral disc degeneration, lumbar region: Secondary | ICD-10-CM | POA: Diagnosis not present

## 2023-03-10 DIAGNOSIS — S32019A Unspecified fracture of first lumbar vertebra, initial encounter for closed fracture: Secondary | ICD-10-CM | POA: Insufficient documentation

## 2023-03-10 DIAGNOSIS — Z8673 Personal history of transient ischemic attack (TIA), and cerebral infarction without residual deficits: Secondary | ICD-10-CM | POA: Diagnosis not present

## 2023-03-10 DIAGNOSIS — S3992XA Unspecified injury of lower back, initial encounter: Secondary | ICD-10-CM | POA: Diagnosis present

## 2023-03-10 DIAGNOSIS — M545 Low back pain, unspecified: Secondary | ICD-10-CM | POA: Diagnosis not present

## 2023-03-10 MED ORDER — ACETAMINOPHEN 325 MG PO TABS
650.0000 mg | ORAL_TABLET | Freq: Once | ORAL | Status: AC
  Administered 2023-03-10: 650 mg via ORAL
  Filled 2023-03-10: qty 2

## 2023-03-10 MED ORDER — CYCLOBENZAPRINE HCL 5 MG PO TABS
5.0000 mg | ORAL_TABLET | Freq: Once | ORAL | Status: AC
  Administered 2023-03-10: 5 mg via ORAL
  Filled 2023-03-10: qty 1

## 2023-03-10 NOTE — ED Provider Notes (Signed)
[place] EMERGENCY DEPARTMENT AT [facility] Provider Note   CSN: [id number] Arrival date & time: 03/10/23  1929     History  Chief Complaint  Patient presents with   Back Pain    [name] is a 82 y.o. female.  82 year old female with a history of degenerative disc disease status post L4-L5 and L5-S1 laminectomy and foramina ectomy with discectomy and CVA on Plavix who presents emergency department with back pain.  6 months ago patient had a fall where she broke a rib on the right side.  Had an additional fall 3 months ago where she fell and hit her back.  Since then has had progressively worsening back pain.  In September pain started getting worse.  No bowel or bladder incontinence.  No new numbness or weakness of her legs.  Walks with a walker and lives at home.  Told her primary doctor about her pain who referred her to the emergency department for additional evaluation.       Home Medications Prior to Admission medications   Medication Sig Start Date End Date Taking? Authorizing Provider  cyclobenzaprine (FLEXERIL) 10 MG tablet Take 0.5 tablets (5 mg total) by mouth 2 (two) times daily as needed for muscle spasms. 03/11/23  Yes Rondel Baton, MD  atenolol (TENORMIN) 50 MG tablet Take 1 tablet (50 mg total) by mouth daily. 06/17/22   Tyson Alias, MD  atorvastatin (LIPITOR) 80 MG tablet TAKE 1 TABLET BY MOUTH EVERY DAY AT [facility] 10/08/22   Tyson Alias, MD  Cholecalciferol (VITAMIN D3) 50 MCG (2000 UT) capsule TAKE 1 CAPSULE BY MOUTH EVERY DAY 03/10/22   Tyson Alias, MD  clopidogrel (PLAVIX) 75 MG tablet TAKE 1 TABLET BY MOUTH EVERY DAY 03/10/22   Tyson Alias, MD  diclofenac Sodium (VOLTAREN) 1 % GEL Apply 2 g topically 4 (four) times daily as needed. 06/22/22   Long, Arlyss Repress, MD  fluticasone (FLONASE) 50 MCG/ACT nasal spray SPRAY 1 SPRAY INTO BOTH NOSTRILS DAILY. 10/25/22   Tyson Alias, MD  furosemide (LASIX) 40 MG  tablet Take 1 tablet (40 mg total) by mouth daily as needed for edema. 08/02/22   Tyson Alias, MD  lidocaine (XYLOCAINE) 5 % ointment Apply 1 Application topically as needed. 06/22/22   Long, Arlyss Repress, MD  lisinopril (ZESTRIL) 20 MG tablet TAKE 1 TABLET BY MOUTH EVERY DAY 07/07/22   Tyson Alias, MD  meloxicam (MOBIC) 7.5 MG tablet TAKE 1 TABLET (7.5 MG TOTAL) BY MOUTH DAILY AS NEEDED FOR PAIN (NOT TO EXCEED TWO DOSES PER WEEK). 06/17/22 06/17/23  Tyson Alias, MD      Allergies    Fluzone Zenovia Jordan virus vaccine], Sulfonamide derivatives, and Penicillins    Review of Systems   Review of Systems  Physical Exam Updated Vital Signs BP (!) 150/81   Pulse 62   Temp (!) 97.5 F (36.4 C)   Resp 18   LMP  (LMP Unknown)   SpO2 100%  Physical Exam Vitals and nursing note reviewed.  Constitutional:      General: She is not in acute distress.    Appearance: She is well-developed.  HENT:     Head: Normocephalic and atraumatic.     Right Ear: External ear normal.     Left Ear: External ear normal.     Nose: Nose normal.  Eyes:     Extraocular Movements: Extraocular movements intact.     Conjunctiva/sclera: Conjunctivae normal.  Pupils: Pupils are equal, round, and reactive to light.  Pulmonary:     Effort: Pulmonary effort is normal. No respiratory distress.  Musculoskeletal:     Cervical back: Normal range of motion and neck supple.     Right lower leg: No edema.     Left lower leg: No edema.     Comments: No spinal midline TTP in cervical, thoracic, or lumbar spine.  Paraspinal tenderness to palpation of lumbar spine at approximately L2.  No stepoffs noted.   Motor: Muscle bulk and tone are normal. Strength is 4/5 in hip flexion, knee flexion and extension, ankle dorsiflexion and plantar flexion bilaterally. Full strength of great toe dorsiflexion bilaterally.  Per patient and family is at full strength at this time.  Sensory: Intact sensation to light  touch in L2 though S1 dermatomes bilaterally.   Skin:    General: Skin is warm and dry.  Neurological:     Mental Status: She is alert and oriented to person, place, and time. Mental status is at baseline.  Psychiatric:        Mood and Affect: Mood normal.     ED Results / Procedures / Treatments   Labs (all labs ordered are listed, but only abnormal results are displayed) Labs Reviewed - No data to display  EKG None  Radiology CT Lumbar Spine Wo Contrast  Result Date: 03/10/2023 CLINICAL DATA:  Fall 6 months ago, low back pain EXAM: CT LUMBAR SPINE WITHOUT CONTRAST TECHNIQUE: Multidetector CT imaging of the lumbar spine was performed without intravenous contrast administration. Multiplanar CT image reconstructions were also generated. RADIATION DOSE REDUCTION: This exam was performed according to the departmental dose-optimization program which includes automated exposure control, adjustment of the mA and/or kV according to patient size and/or use of iterative reconstruction technique. COMPARISON:  Lumbar spine radiographs dated 03/10/2023 FINDINGS: Segmentation: 5 lumbar type vertebral bodies. Alignment: Normal lumbar lordosis. Vertebrae: Fracture involving the anterior aspect of the L1 vertebral body (sagittal image 40), with mild sclerosis suggesting that it is subacute. No loss of height. Paraspinal and other soft tissues: Atherosclerotic calcifications of the abdominal aorta and branch vessels. Disc levels: Mild to moderate multilevel degenerative changes flowing anterior osteophytes. Mild narrowing of the spinal canal at L1-2. IMPRESSION: Subacute fracture involving the anterior aspect of the L1 vertebral body. No loss of height. Mild to moderate multilevel degenerative changes. Electronically Signed   By: Charline Bills M.D.   On: 03/10/2023 22:35   DG Lumbar Spine 2-3 Views  Result Date: 03/10/2023 CLINICAL DATA:  Low back pain, no known injury, initial encounter EXAM: LUMBAR  SPINE - 3 VIEW COMPARISON:  None Available. FINDINGS: Five lumbar type vertebral bodies are well visualized. Vertebral body height is well maintained. Multilevel disc space narrowing and osteophytic changes are seen. Facet hypertrophic changes are noted as well. No soft tissue changes are noted. IMPRESSION: Degenerative change without acute abnormality. Electronically Signed   By: Alcide Clever M.D.   On: 03/10/2023 21:13    Procedures Procedures    Medications Ordered in ED Medications  acetaminophen (TYLENOL) tablet 650 mg (650 mg Oral Given 03/10/23 2156)  cyclobenzaprine (FLEXERIL) tablet 5 mg (5 mg Oral Given 03/10/23 2343)  oxyCODONE-acetaminophen (PERCOCET/ROXICET) 5-325 MG per tablet 1 tablet (1 tablet Oral Given 03/11/23 9528)    ED Course/ Medical Decision Making/ A&P  Medical Decision Making Amount and/or Complexity of Data Reviewed Radiology: ordered.  Risk OTC drugs. Prescription drug management.   DELAILA GIARRAPUTO is a 82 y.o. female with comorbidities that complicate the patient evaluation including degenerative disc disease status post L4-L5 and L5-S1 laminectomy and foramina ectomy with discectomy and CVA on Plavix who presents emergency department with back pain.    Initial Ddx:  Lumbar spine fracture, spinal epidural hematoma, spinal cord compression  MDM/Course:  Patient presents emergency department after a fall several months ago and worsening back pain over the past month.  On exam no signs of spinal cord compression and does have tenderness to palpation near the L2 region.  Had lumbar spine x-rays which did not show fracture.  This was followed up by CT scan that does show a subacute L1 fracture.  TLSO brace was ordered for the patient will have her follow-up with neurosurgery.  After being given Flexeril and Tylenol was feeling much better upon reevaluation.  Will have her follow-up with spine surgery as an outpatient as well since she  is not having any signs of spinal cord compression right now that would warrant emergent consultation.  Did also consider spinal epidural hematoma because she is on Plavix but given the very gradual progression of her symptoms and no neurodeficits feel that her fracture is responsible for her symptoms.  This patient presents to the ED for concern of complaints listed in HPI, this involves an extensive number of treatment options, and is a complaint that carries with it a high risk of complications and morbidity. Disposition including potential need for admission considered.   Dispo: DC Home. Return precautions discussed including, but not limited to, those listed in the AVS. Allowed pt time to ask questions which were answered fully prior to dc.  Additional history obtained from daughter Records reviewed Outpatient Clinic Notes I independently reviewed the following imaging with scope of interpretation limited to determining acute life threatening conditions related to emergency care:  Lumbar spine x-ray  and agree with the radiologist interpretation with the following exceptions: none I personally reviewed and interpreted cardiac monitoring: normal sinus rhythm  I personally reviewed and interpreted the pt's EKG: see above for interpretation  I have reviewed the patients home medications and made adjustments as needed Social Determinants of health:  Elderly         Final Clinical Impression(s) / ED Diagnoses Final diagnoses:  Closed fracture of first lumbar vertebra, unspecified fracture morphology, initial encounter (HCC)  Acute right-sided low back pain without sciatica    Rx / DC Orders ED Discharge Orders          Ordered    cyclobenzaprine (FLEXERIL) 10 MG tablet  2 times daily PRN        03/11/23 0005              Rondel Baton, MD 03/11/23 [phone]

## 2023-03-10 NOTE — ED Triage Notes (Signed)
Pt with lower back pain with movement and in certain positions.  Had some falls about 6 months ago but felt this wasn't related to those.  Pt uses a walker at home.   Pain does not radiate.

## 2023-03-10 NOTE — Discharge Instructions (Addendum)
You were seen for your back pain in the emergency department.   At home, please use over-the-counter Tylenol and lidocaine patches.  You may also use the cyclobenzaprine we have prescribed you as needed for pain.  Do not take the cyclobenzaprine before driving or operating heavy machinery.  Follow-up with your primary doctor in 2-3 days regarding your visit.  Follow-up with the spine clinic as soon as possible regarding your symptoms.  Return immediately to the emergency department if you experience any of the following: Numbness or weakness of your legs, bowel or bladder incontinence, numbness while wiping after pooping or urinating, or any other concerning symptoms.    Thank you for visiting our Emergency Department. It was a pleasure taking care of you today.

## 2023-03-10 NOTE — Telephone Encounter (Signed)
Call from patient's daughter states patient is in a lot of pain and screams when she tries to move her in the bed.  States her mother has had a couple of falls that the doctor is aware of but the pain is worse.  Would like to have patient seen.  Does have some swelling where she has been putting the patches.  Has some shortness of breath as well.  Would like to get Xrays if possible as patient had broken ribs before due to a fall.  Advised to take patient to [facility] ER so that patient will not have an extended wait.  Will take patient to [facility] ER .

## 2023-03-10 NOTE — Progress Notes (Signed)
Orthopedic Tech Progress Note Patient Details:  [name] 1941/05/19 [id number]  Called in order to [employer] for a STAT TLSO BRACE   Patient ID: [name], female   DOB: 05/20/1941, 82 y.o.   MRN: [id number]  Donald Pore 03/10/2023, 11:36 PM

## 2023-03-10 NOTE — ED Notes (Signed)
TLSO ordered via [facility] Ortho tech. No ETA at this time. STAT order.

## 2023-03-11 DIAGNOSIS — M170 Bilateral primary osteoarthritis of knee: Secondary | ICD-10-CM | POA: Diagnosis not present

## 2023-03-11 DIAGNOSIS — S32019A Unspecified fracture of first lumbar vertebra, initial encounter for closed fracture: Secondary | ICD-10-CM | POA: Diagnosis not present

## 2023-03-11 MED ORDER — CYCLOBENZAPRINE HCL 10 MG PO TABS: 5.0000 mg | ORAL_TABLET | Freq: Two times a day (BID) | ORAL | 0 refills | Status: DC | PRN

## 2023-03-11 MED ORDER — OXYCODONE-ACETAMINOPHEN 5-325 MG PO TABS
1.0000 | ORAL_TABLET | Freq: Once | ORAL | Status: AC
  Administered 2023-03-11: 1 via ORAL
  Filled 2023-03-11: qty 1

## 2023-03-11 NOTE — ED Notes (Signed)
Changed of urine soaked clothing. Full linen change. Dry brief applied. Supports self with chair for assist. Returns to bed without incident.

## 2023-03-16 ENCOUNTER — Other Ambulatory Visit: Payer: Self-pay

## 2023-03-16 NOTE — Progress Notes (Signed)
[name] January 05, 1941 [id number]  Patient attempted to be outreached by Thomasene Ripple, PharmD Candidate to discuss hypertension. Left voicemail for patient to return our call at their convenience at [phone].  Thomasene Ripple, Student-PharmD

## 2023-03-21 ENCOUNTER — Encounter: Payer: Self-pay | Admitting: Student in an Organized Health Care Education/Training Program

## 2023-03-21 ENCOUNTER — Ambulatory Visit: Payer: 59 | Admitting: Student in an Organized Health Care Education/Training Program

## 2023-03-21 VITALS — BP 143/78 | HR 80 | Wt 178.9 lb

## 2023-03-21 DIAGNOSIS — M170 Bilateral primary osteoarthritis of knee: Secondary | ICD-10-CM | POA: Diagnosis not present

## 2023-03-21 DIAGNOSIS — N1831 Chronic kidney disease, stage 3a: Secondary | ICD-10-CM

## 2023-03-21 DIAGNOSIS — R600 Localized edema: Secondary | ICD-10-CM

## 2023-03-21 DIAGNOSIS — Z23 Encounter for immunization: Secondary | ICD-10-CM | POA: Diagnosis not present

## 2023-03-21 DIAGNOSIS — S32019D Unspecified fracture of first lumbar vertebra, subsequent encounter for fracture with routine healing: Secondary | ICD-10-CM | POA: Diagnosis not present

## 2023-03-21 DIAGNOSIS — I10 Essential (primary) hypertension: Secondary | ICD-10-CM | POA: Diagnosis not present

## 2023-03-21 DIAGNOSIS — Z87891 Personal history of nicotine dependence: Secondary | ICD-10-CM | POA: Diagnosis not present

## 2023-03-21 DIAGNOSIS — M810 Age-related osteoporosis without current pathological fracture: Secondary | ICD-10-CM | POA: Diagnosis not present

## 2023-03-21 MED ORDER — ALENDRONATE SODIUM 10 MG PO TABS: 10.0000 mg | ORAL_TABLET | Freq: Every day | ORAL | 3 refills | Status: DC

## 2023-03-21 MED ORDER — CHLORTHALIDONE 25 MG PO TABS
25.0000 mg | ORAL_TABLET | Freq: Every day | ORAL | 3 refills | Status: DC
Start: 2023-03-21 — End: 2024-04-10

## 2023-03-21 NOTE — Progress Notes (Signed)
Attestation for Student Documentation:  I personally was present and re-performed the history, physical exam and medical decision-making activities of this service and have verified that the service and findings are accurately documented in the student's note.  82 year old person here for an emergency department follow-up after a fall and found to have a L1 vertebral body fracture.  It seems that this was a fragility fracture, there was no loss in the height of the vertebral body, no wedge deformity, no signs of pathologic fracture.  Patient has a history of fragility fractures, was treated with alendronate for 5 years from 2017-22.  She is at risk for falls because of frailty of advanced age and chronic lower extremity edema.  Doing okay at home since that diagnosis.  She is in significant discomfort in her upper low back, she is able to walk with an upright walker, and uses a back brace for stability.  She struggles with transferring from standing next to the bed to lying down in the bed due to pain and discomfort.  Her daughter is a Lawyer and thinks that a Special educational needs teacher lift would be very helpful in assisting her with transfers in and out of bed.  Patient's neurologic exam today is normal but she has significant weakness due to the back brace and the back pain.  I think a Hoyer lift is medically reasonable given the deconditioning, lower extremity edema, weakness, and discomfort from the L1 fracture.  Daughter is doing a great job taking care of of the patients who I think would be requiring skilled nursing level care if it was not for such a high degree of family support.  For the osteoporosis we will check vitamin D level today and restart alendronate 10 mg daily for another 5 years.  Will start hydrochlorothiazide to help with the lower extremity edema and the hypertension.  Patient could not tolerate Lasix.  She did tolerate thiazide diuretics for many years up until about 2012.  She is very deconditioned  and I will order at home physical therapy because of how difficult it is for her to leave the house.  Tyson Alias, MD 03/21/2023, 1:43 PM

## 2023-03-21 NOTE — Assessment & Plan Note (Addendum)
CT from recent ED visit on 03/10/2023 revealed L1 fracture causing significant back pain and decreased mobility. No signs of decreased vertebrae height. Pain is not radiating. Pt taking Meloxicam occasionally but not everyday. Pt hesitant on seeing any specialist or any surgery. Hx of osteoporosis with fragility fracture in 2017. Completed 5 year course of alendronate before discontinuing them in 2023 to reduce pill burden.   Assessment  Pain is caused by her fracture secondary to osteoporosis and old age.   Significant decrease in mobility and highly fragile   Plan  - Start alendronate 10 mg daily  - Lab - Vitamin D - f/u in 2 months - refer to at home physical therapy  - Order Michiel Sites lift

## 2023-03-21 NOTE — Progress Notes (Signed)
Subjective:   Patient ID: [name] female   DOB: 1940-08-11 82 y.o.   MRN: [id number]  HPI: [name] is a 82 y.o. female with PMH as below presenting to clinic for follow up on chronic back pain due to multiple falls and L1 fracture. [name] is present with her daughter who lives with her and is also her caretaker who helps with ADLs. Patient has a hx of fall from January 2024 with a 9th rib fracture, and another one couple of months later per daughter. Did not follow up with any provider after the second fall however on 03/10/2023 visited the ED due to back pain and was found to have L1 fracture. Pt denies any headaches or dizziness before and after fall episodes. Denies any radiculopathy or bowel changes. No weight loss. Since the fall, has decreased mobility and is highly fragile due to the pain. Before was able to go to the laundry room and walk around the house, but since the fracture mobility has significantly decreased. With the help of her daughter she is able to go to the bathroom.   Her room was rearranged and cleared to prevent accidental tripping.  Daughter who is also a CNA explains [name] has trouble getting herself to the bed and lying down and would be interested in a [employer]. She has experience using the lift with multiple family members and explains it would help with mobility as [name] is tall and she is having difficulty helping her get in bed. Currently, pt uses a walker and a wheelchair when traveling. They are also interested in physical therapy. Please see assessment and plan for details of the visit.     Past Medical History:  Diagnosis Date   Abnormal electromyogram (EMG)    Low grade right S1 radiculopathy 7/93.   Anemia 05/01/2014   Diastolic dysfunction    a. 08/2012 Echo: EF 60-65%, Gr 2 DD, triv MR, PASP .   Distal radius fracture, right 12/22/2015   DJD (degenerative joint disease)    Elevated alkaline phosphatase level    Bone  scan 08/2004 showed no evidence of abnormal bony activity.[name]   Herniated lumbar intervertebral disc    S/P L4-5 & L5-S1 laminotomy, foraminotomy, and L5-S1 diskectomy by Dr. Trey Sailors 11/01/2000.   Hyperlipemia    Hypertension    Iron deficiency anemia    Osteopenia    A DEXA bone density scan done 02/13/2009 showed a left femur neck young adult T-score of -1.6 (osteopenia), a right femur neck young adult T-score of -1.6 (osteopenia), and an AP spine young adult T-score of 1.3 (normal).  Her FRAX score gave an estimated 10 year probability of 6.6% for major osteoporotic fracture and 0.8% for hip fracture.   Peripheral neuropathy    Rotator cuff syndrome of right shoulder    S/P right shoulder arthroscopic debridement of a massive rotator cuff tear, greater tuberosity and resection of subacromial spur by Dr. Gean Birchwood 07/07/2005.   Stroke [facility]) 04/2013   SVT (supraventricular tachycardia)    PSVT   Vitamin D deficiency    Current Outpatient Medications  Medication Sig Dispense Refill   alendronate (FOSAMAX) 10 MG tablet Take 1 tablet (10 mg total) by mouth daily before breakfast. Take with a full glass of water on an empty stomach. 90 tablet 3   chlorthalidone (HYGROTON) 25 MG tablet Take 1 tablet (25 mg total) by mouth daily. 90 tablet 3   atenolol (TENORMIN) 50 MG tablet  Take 1 tablet (50 mg total) by mouth daily. 90 tablet 3   atorvastatin (LIPITOR) 80 MG tablet TAKE 1 TABLET BY MOUTH EVERY DAY AT 6PM 90 tablet 2   Cholecalciferol (VITAMIN D3) 50 MCG (2000 UT) capsule TAKE 1 CAPSULE BY MOUTH EVERY DAY 90 capsule 1   clopidogrel (PLAVIX) 75 MG tablet TAKE 1 TABLET BY MOUTH EVERY DAY 90 tablet 3   diclofenac Sodium (VOLTAREN) 1 % GEL Apply 2 g topically 4 (four) times daily as needed. 100 g 0   fluticasone (FLONASE) 50 MCG/ACT nasal spray SPRAY 1 SPRAY INTO BOTH NOSTRILS DAILY. 16 mL 2   lisinopril (ZESTRIL) 20 MG tablet TAKE 1 TABLET BY MOUTH EVERY DAY 90 tablet 3   No current  facility-administered medications for this visit.   Family History  Problem Relation Age of Onset   Ovarian cancer Mother 91   Hypertension Mother    Heart attack Mother 77   Emphysema Father    Hypertension Sister    Stroke Sister    Hypertension Sister    Cancer Brother    Breast cancer Neg Hx    Colon cancer Neg Hx    Lung cancer Neg Hx    Social History   Socioeconomic History   Marital status: Single    Spouse name: Not on file   Number of children: 6   Years of education: 12th   Highest education level: Not on file  Occupational History   Occupation: retired  Tobacco Use   Smoking status: Former    Current packs/day: 0.00    Average packs/day: 0.2 packs/day for 2.0 years (0.3 ttl pk-yrs)    Types: Cigarettes    Start date: 10/06/1997    Quit date: 10/07/1999    Years since quitting: 23.4   Smokeless tobacco: Never  Substance and Sexual Activity   Alcohol use: No    Alcohol/week: 0.0 standard drinks of alcohol   Drug use: No   Sexual activity: Not Currently  Other Topics Concern   Not on file  Social History Narrative   Current Social History 06/29/2021        Patient lives with daughter in an apartment which is 1 story. There are not steps up to the entrance the patient uses.       Patient's method of transportation is via family member.      The highest level of education was high school diploma.      The patient currently disabled.      Identified important Relationships are "my oldest son and my daughter that I live with"       Pets : 0       Interests / Fun: "I don't do a lot of things"       Current Stressors: "none"       Religious / Personal Beliefs: "none"       Social Determinants of Health   Financial Resource Strain: Not on file  Food Insecurity: Not on file  Transportation Needs: Not on file  Physical Activity: Not on file  Stress: Not on file  Social Connections: Not on file   Review of Systems: ROS negative except for what is  noted on the assessment and plan.  Objective:  Physical Exam: Vitals:   03/21/23 1057  BP: (!) 143/78  Pulse: 80  SpO2: 100%  Weight: 178 lb 14.4 oz (81.1 kg)    General appearance: well appearing, in a wheel-chair; wearing a back brace with lumbar and  cervical support Lungs: CTAB, no crackles, no wheeze, with normal respiratory effort and no intercostal retractions CV: RRR, S1, S2, no MRGs  Abdomen: Soft, non-tender; non-distended, BS present  Extremities: bilateral edema, radial and DP pulses present bilaterally  Skin: Normal temperature, turgor and texture; no rash Psych: Appropriate affect Neuro: Alert and oriented to person and place, no focal deficit   Assessment & Plan:  L1 vertebral fracture (HCC) CT from recent ED visit on 03/10/2023 revealed L1 fracture causing significant back pain and decreased mobility. No signs of decreased vertebrae height. Pain is not radiating. Pt taking Meloxicam occasionally but not everyday. Pt hesitant on seeing any specialist or any surgery. Hx of osteoporosis with fragility fracture in 2017. Completed 5 year course of alendronate before discontinuing them in 2023 to reduce pill burden.   Assessment  Pain is caused by her fracture secondary to osteoporosis and old age.   Significant decrease in mobility and highly fragile   Plan  - Start alendronate 10 mg daily  - Lab - Vitamin D - f/u in 2 months - refer to at home physical therapy  - Order Michiel Sites lift   Essential hypertension [name] is taking lisinopril 20 mg daily for her chronic hypertension. On 01/31/2023 Lasix was added for Htn and bilateral edema however discontinued after 2 weeks due to increased urinary frequency and incontinence.  Her bilateral edema has slightly gotten better. She has compression socks on. She denies any chest pain, blurry vision, dizziness, headaches or shortness of blood. Blood pressure is 143/78 similar to readings at home.  Assessment  - Chronic and BP  not at goal. Her goal should be <130/80.  Plan  - continue 20 mg lisinopril daily - start chlorthalidone 25 mg daily -Labs - CMP with anion gap - continue compression and leg elevation - f/u in 2 months  Patient discussed with Dr. Oswaldo Done

## 2023-03-21 NOTE — Patient Instructions (Signed)
Thank you, [name] for allowing [place] to provide your care today. Today we discussed the following.   Back pain - Please start taking this new medicine alendronate 10 mg to keep your bones stronger.  High blood pressure - Please start taking chlorthalidone 25 mg daily. This is less likely to cause you to go to the bathroom.  Physical therapy - We have put in a referral for at home physical therapy. They should call you to schedule their visits.  Hoyer lift - We also put in a order of the [place] lift.  Labs : we are checking you vitamin D levels and electrolytes today.  I have ordered the following labs for you:  Lab Orders         CMP14 + Anion Gap         Vitamin D (25 hydroxy)      Referrals ordered today:   Referral Orders         Ambulatory Referral for DME       I have ordered the following medication/changed the following medications:   Stop the following medications: Medications Discontinued During This Encounter  Medication Reason   furosemide (LASIX) 40 MG tablet    meloxicam (MOBIC) 7.5 MG tablet    cyclobenzaprine (FLEXERIL) 10 MG tablet    lidocaine (XYLOCAINE) 5 % ointment      Start the following medications: Meds ordered this encounter  Medications   alendronate (FOSAMAX) 10 MG tablet    Sig: Take 1 tablet (10 mg total) by mouth daily before breakfast. Take with a full glass of water on an empty stomach.    Dispense:  90 tablet    Refill:  3   chlorthalidone (HYGROTON) 25 MG tablet    Sig: Take 1 tablet (25 mg total) by mouth daily.    Dispense:  90 tablet    Refill:  3     Follow up: 2 months   Remember:   We look forward to seeing you next time. Please call our clinic at [phone] if you have any questions or concerns. The best time to call is Monday-Friday from 9am-4pm, but there is someone available 24/7. If after hours or the weekend, call the main hospital number and ask for the Internal Medicine Resident On-Call. If you need medication  refills, please notify your pharmacy one week in advance and they will send [place] a request.   Thank you for trusting me with your care. Wishing you the best! [facility]

## 2023-03-21 NOTE — Assessment & Plan Note (Addendum)
[name] is taking lisinopril 20 mg daily for her chronic hypertension. On 01/31/2023 Lasix was added for Htn and bilateral edema however discontinued after 2 weeks due to increased urinary frequency and incontinence.  Her bilateral edema has slightly gotten better. She has compression socks on. She denies any chest pain, blurry vision, dizziness, headaches or shortness of blood. Blood pressure is 143/78 similar to readings at home.  Assessment  - Chronic and BP not at goal. Her goal should be <130/80.  Plan  - continue 20 mg lisinopril daily - start chlorthalidone 25 mg daily -Labs - CMP with anion gap - continue compression and leg elevation - f/u in 2 months

## 2023-03-22 LAB — CMP14 + ANION GAP
ALT: 8 [IU]/L (ref 0–32)
AST: 12 [IU]/L (ref 0–40)
Albumin: 4.1 g/dL (ref 3.7–4.7)
Alkaline Phosphatase: 123 [IU]/L — ABNORMAL HIGH (ref 44–121)
Anion Gap: 11 mmol/L (ref 10.0–18.0)
BUN/Creatinine Ratio: 16 (ref 12–28)
BUN: 14 mg/dL (ref 8–27)
Bilirubin Total: 0.6 mg/dL (ref 0.0–1.2)
CO2: 27 mmol/L (ref 20–29)
Calcium: 9.8 mg/dL (ref 8.7–10.3)
Chloride: 104 mmol/L (ref 96–106)
Creatinine, Ser: 0.87 mg/dL (ref 0.57–1.00)
Globulin, Total: 1.9 g/dL (ref 1.5–4.5)
Glucose: 94 mg/dL (ref 70–99)
Potassium: 4.1 mmol/L (ref 3.5–5.2)
Sodium: 142 mmol/L (ref 134–144)
Total Protein: 6 g/dL (ref 6.0–8.5)
eGFR: 67 mL/min/{1.73_m2} (ref >59)

## 2023-03-22 LAB — VITAMIN D 25 HYDROXY (VIT D DEFICIENCY, FRACTURES): Vit D, 25-Hydroxy: 17.5 ng/mL — ABNORMAL LOW (ref 30.0–100.0)

## 2023-03-25 ENCOUNTER — Telehealth: Payer: Self-pay | Admitting: *Deleted

## 2023-03-25 NOTE — Telephone Encounter (Signed)
Sounds great. Patient requested a female therapist.

## 2023-03-25 NOTE — Telephone Encounter (Signed)
Call from [place], [place] Roxan Diesel been unable to reach patient or family until today. Spoke to daughter-patient agreed to start PT on Monday.  Verbal ok was given .  Will forward to PCP/Team for denial or agreement-.

## 2023-03-28 ENCOUNTER — Encounter: Payer: Self-pay | Admitting: Student in an Organized Health Care Education/Training Program

## 2023-03-28 ENCOUNTER — Telehealth: Payer: Self-pay | Admitting: *Deleted

## 2023-03-28 DIAGNOSIS — I5030 Unspecified diastolic (congestive) heart failure: Secondary | ICD-10-CM | POA: Diagnosis not present

## 2023-03-28 DIAGNOSIS — G8929 Other chronic pain: Secondary | ICD-10-CM | POA: Diagnosis not present

## 2023-03-28 DIAGNOSIS — S32019D Unspecified fracture of first lumbar vertebra, subsequent encounter for fracture with routine healing: Secondary | ICD-10-CM | POA: Diagnosis not present

## 2023-03-28 DIAGNOSIS — N1831 Chronic kidney disease, stage 3a: Secondary | ICD-10-CM | POA: Diagnosis not present

## 2023-03-28 DIAGNOSIS — E785 Hyperlipidemia, unspecified: Secondary | ICD-10-CM | POA: Diagnosis not present

## 2023-03-28 DIAGNOSIS — I130 Hypertensive heart and chronic kidney disease with heart failure and stage 1 through stage 4 chronic kidney disease, or unspecified chronic kidney disease: Secondary | ICD-10-CM | POA: Diagnosis not present

## 2023-03-28 DIAGNOSIS — G9009 Other idiopathic peripheral autonomic neuropathy: Secondary | ICD-10-CM | POA: Diagnosis not present

## 2023-03-28 DIAGNOSIS — M810 Age-related osteoporosis without current pathological fracture: Secondary | ICD-10-CM | POA: Diagnosis not present

## 2023-03-28 DIAGNOSIS — W19XXXD Unspecified fall, subsequent encounter: Secondary | ICD-10-CM | POA: Diagnosis not present

## 2023-03-28 DIAGNOSIS — M549 Dorsalgia, unspecified: Secondary | ICD-10-CM | POA: Diagnosis not present

## 2023-03-28 NOTE — Telephone Encounter (Signed)
RTC to [place], RN [facility] to inform her that patient is requesting only a female for PT.  Wilkie Aye to put on patient's chart.

## 2023-03-28 NOTE — Telephone Encounter (Signed)
Call from [place] PT, [facility] reassessed patient to day would like verbal orders for PT.  2 times a week for 3 weeks and 1 time a week for 3 weeks.  Strengthening,  Press photographer.  Verbal ok given will forward to PCP for approval or denial.

## 2023-03-29 DIAGNOSIS — N1831 Chronic kidney disease, stage 3a: Secondary | ICD-10-CM | POA: Diagnosis not present

## 2023-03-29 DIAGNOSIS — M170 Bilateral primary osteoarthritis of knee: Secondary | ICD-10-CM | POA: Diagnosis not present

## 2023-03-29 DIAGNOSIS — S32019D Unspecified fracture of first lumbar vertebra, subsequent encounter for fracture with routine healing: Secondary | ICD-10-CM | POA: Diagnosis not present

## 2023-03-29 DIAGNOSIS — R600 Localized edema: Secondary | ICD-10-CM | POA: Diagnosis not present

## 2023-03-29 NOTE — Telephone Encounter (Signed)
Absolutely agree. Thank you.

## 2023-03-30 DIAGNOSIS — I130 Hypertensive heart and chronic kidney disease with heart failure and stage 1 through stage 4 chronic kidney disease, or unspecified chronic kidney disease: Secondary | ICD-10-CM | POA: Diagnosis not present

## 2023-03-30 DIAGNOSIS — I5030 Unspecified diastolic (congestive) heart failure: Secondary | ICD-10-CM | POA: Diagnosis not present

## 2023-03-30 DIAGNOSIS — G8929 Other chronic pain: Secondary | ICD-10-CM | POA: Diagnosis not present

## 2023-03-30 DIAGNOSIS — E785 Hyperlipidemia, unspecified: Secondary | ICD-10-CM | POA: Diagnosis not present

## 2023-03-30 DIAGNOSIS — G9009 Other idiopathic peripheral autonomic neuropathy: Secondary | ICD-10-CM | POA: Diagnosis not present

## 2023-03-30 DIAGNOSIS — S32019D Unspecified fracture of first lumbar vertebra, subsequent encounter for fracture with routine healing: Secondary | ICD-10-CM | POA: Diagnosis not present

## 2023-03-30 DIAGNOSIS — W19XXXD Unspecified fall, subsequent encounter: Secondary | ICD-10-CM | POA: Diagnosis not present

## 2023-03-30 DIAGNOSIS — M549 Dorsalgia, unspecified: Secondary | ICD-10-CM | POA: Diagnosis not present

## 2023-03-30 DIAGNOSIS — N1831 Chronic kidney disease, stage 3a: Secondary | ICD-10-CM | POA: Diagnosis not present

## 2023-03-30 DIAGNOSIS — M810 Age-related osteoporosis without current pathological fracture: Secondary | ICD-10-CM | POA: Diagnosis not present

## 2023-04-04 ENCOUNTER — Telehealth: Payer: Self-pay | Admitting: *Deleted

## 2023-04-04 DIAGNOSIS — G9009 Other idiopathic peripheral autonomic neuropathy: Secondary | ICD-10-CM | POA: Diagnosis not present

## 2023-04-04 DIAGNOSIS — N1831 Chronic kidney disease, stage 3a: Secondary | ICD-10-CM | POA: Diagnosis not present

## 2023-04-04 DIAGNOSIS — S32019D Unspecified fracture of first lumbar vertebra, subsequent encounter for fracture with routine healing: Secondary | ICD-10-CM | POA: Diagnosis not present

## 2023-04-04 DIAGNOSIS — W19XXXD Unspecified fall, subsequent encounter: Secondary | ICD-10-CM | POA: Diagnosis not present

## 2023-04-04 DIAGNOSIS — E785 Hyperlipidemia, unspecified: Secondary | ICD-10-CM | POA: Diagnosis not present

## 2023-04-04 DIAGNOSIS — M810 Age-related osteoporosis without current pathological fracture: Secondary | ICD-10-CM | POA: Diagnosis not present

## 2023-04-04 DIAGNOSIS — G8929 Other chronic pain: Secondary | ICD-10-CM | POA: Diagnosis not present

## 2023-04-04 DIAGNOSIS — I5030 Unspecified diastolic (congestive) heart failure: Secondary | ICD-10-CM | POA: Diagnosis not present

## 2023-04-04 DIAGNOSIS — I130 Hypertensive heart and chronic kidney disease with heart failure and stage 1 through stage 4 chronic kidney disease, or unspecified chronic kidney disease: Secondary | ICD-10-CM | POA: Diagnosis not present

## 2023-04-04 DIAGNOSIS — M549 Dorsalgia, unspecified: Secondary | ICD-10-CM | POA: Diagnosis not present

## 2023-04-04 NOTE — Telephone Encounter (Signed)
Ok. Thanks for letting me know. Nothing to do differently right now.

## 2023-04-04 NOTE — Telephone Encounter (Signed)
Received a call from [place] PT with [facility]. Stated pt's weight is down to 169.lb (he stated doctor's parameter to call is 173-183 lbs).

## 2023-04-06 DIAGNOSIS — E785 Hyperlipidemia, unspecified: Secondary | ICD-10-CM | POA: Diagnosis not present

## 2023-04-06 DIAGNOSIS — M810 Age-related osteoporosis without current pathological fracture: Secondary | ICD-10-CM | POA: Diagnosis not present

## 2023-04-06 DIAGNOSIS — M549 Dorsalgia, unspecified: Secondary | ICD-10-CM | POA: Diagnosis not present

## 2023-04-06 DIAGNOSIS — S32019D Unspecified fracture of first lumbar vertebra, subsequent encounter for fracture with routine healing: Secondary | ICD-10-CM | POA: Diagnosis not present

## 2023-04-06 DIAGNOSIS — N1831 Chronic kidney disease, stage 3a: Secondary | ICD-10-CM | POA: Diagnosis not present

## 2023-04-06 DIAGNOSIS — I5030 Unspecified diastolic (congestive) heart failure: Secondary | ICD-10-CM | POA: Diagnosis not present

## 2023-04-06 DIAGNOSIS — G8929 Other chronic pain: Secondary | ICD-10-CM | POA: Diagnosis not present

## 2023-04-06 DIAGNOSIS — G9009 Other idiopathic peripheral autonomic neuropathy: Secondary | ICD-10-CM | POA: Diagnosis not present

## 2023-04-06 DIAGNOSIS — W19XXXD Unspecified fall, subsequent encounter: Secondary | ICD-10-CM | POA: Diagnosis not present

## 2023-04-06 DIAGNOSIS — I130 Hypertensive heart and chronic kidney disease with heart failure and stage 1 through stage 4 chronic kidney disease, or unspecified chronic kidney disease: Secondary | ICD-10-CM | POA: Diagnosis not present

## 2023-04-07 ENCOUNTER — Telehealth: Payer: Self-pay | Admitting: *Deleted

## 2023-04-07 NOTE — Telephone Encounter (Signed)
RTC to Jillyn Hidden message was left that the Clinics had returned his call.

## 2023-04-07 NOTE — Telephone Encounter (Signed)
Please call Jillyn Hidden at [facility] regarding this patient. [phone].

## 2023-04-10 DIAGNOSIS — M170 Bilateral primary osteoarthritis of knee: Secondary | ICD-10-CM | POA: Diagnosis not present

## 2023-04-12 DIAGNOSIS — I5030 Unspecified diastolic (congestive) heart failure: Secondary | ICD-10-CM | POA: Diagnosis not present

## 2023-04-12 DIAGNOSIS — M549 Dorsalgia, unspecified: Secondary | ICD-10-CM | POA: Diagnosis not present

## 2023-04-12 DIAGNOSIS — E785 Hyperlipidemia, unspecified: Secondary | ICD-10-CM | POA: Diagnosis not present

## 2023-04-12 DIAGNOSIS — G8929 Other chronic pain: Secondary | ICD-10-CM | POA: Diagnosis not present

## 2023-04-12 DIAGNOSIS — N1831 Chronic kidney disease, stage 3a: Secondary | ICD-10-CM | POA: Diagnosis not present

## 2023-04-12 DIAGNOSIS — M810 Age-related osteoporosis without current pathological fracture: Secondary | ICD-10-CM | POA: Diagnosis not present

## 2023-04-12 DIAGNOSIS — I130 Hypertensive heart and chronic kidney disease with heart failure and stage 1 through stage 4 chronic kidney disease, or unspecified chronic kidney disease: Secondary | ICD-10-CM | POA: Diagnosis not present

## 2023-04-12 DIAGNOSIS — G9009 Other idiopathic peripheral autonomic neuropathy: Secondary | ICD-10-CM | POA: Diagnosis not present

## 2023-04-12 DIAGNOSIS — W19XXXD Unspecified fall, subsequent encounter: Secondary | ICD-10-CM | POA: Diagnosis not present

## 2023-04-12 DIAGNOSIS — S32019D Unspecified fracture of first lumbar vertebra, subsequent encounter for fracture with routine healing: Secondary | ICD-10-CM | POA: Diagnosis not present

## 2023-04-19 ENCOUNTER — Telehealth: Payer: Self-pay | Admitting: *Deleted

## 2023-04-19 DIAGNOSIS — N1831 Chronic kidney disease, stage 3a: Secondary | ICD-10-CM | POA: Diagnosis not present

## 2023-04-19 DIAGNOSIS — W19XXXD Unspecified fall, subsequent encounter: Secondary | ICD-10-CM | POA: Diagnosis not present

## 2023-04-19 DIAGNOSIS — S32019D Unspecified fracture of first lumbar vertebra, subsequent encounter for fracture with routine healing: Secondary | ICD-10-CM | POA: Diagnosis not present

## 2023-04-19 DIAGNOSIS — E785 Hyperlipidemia, unspecified: Secondary | ICD-10-CM | POA: Diagnosis not present

## 2023-04-19 DIAGNOSIS — M549 Dorsalgia, unspecified: Secondary | ICD-10-CM | POA: Diagnosis not present

## 2023-04-19 DIAGNOSIS — G9009 Other idiopathic peripheral autonomic neuropathy: Secondary | ICD-10-CM | POA: Diagnosis not present

## 2023-04-19 DIAGNOSIS — I5030 Unspecified diastolic (congestive) heart failure: Secondary | ICD-10-CM | POA: Diagnosis not present

## 2023-04-19 DIAGNOSIS — I130 Hypertensive heart and chronic kidney disease with heart failure and stage 1 through stage 4 chronic kidney disease, or unspecified chronic kidney disease: Secondary | ICD-10-CM | POA: Diagnosis not present

## 2023-04-19 DIAGNOSIS — M810 Age-related osteoporosis without current pathological fracture: Secondary | ICD-10-CM | POA: Diagnosis not present

## 2023-04-19 DIAGNOSIS — G8929 Other chronic pain: Secondary | ICD-10-CM | POA: Diagnosis not present

## 2023-04-19 NOTE — Telephone Encounter (Signed)
Call from [place] PT with [facility] reporting pt's weight for today which is 171.0 lb. Last weight was 174.8 lb on 10/22.

## 2023-04-19 NOTE — Telephone Encounter (Signed)
12/2 is ok if she is feeling well. She can come see me sooner if she is having symptoms.

## 2023-04-19 NOTE — Telephone Encounter (Signed)
Ok. She is losing weight more quickly than I would expect. She should not be using lasix at this point. Can we ensure that she has a visit scheduled to see me some time in the next 4 weeks so I can follow up with her, please?

## 2023-04-19 NOTE — Telephone Encounter (Signed)
Called pt - no answer. VM is full ;unable to leave a message. I called pt's home # - pt stated she feeling fine, currently eating a bowl of soup. Stated she does not know why they keep calling about her weight. Pt informed of her appt 12/2 but if she needs to be seen sooner to call our office; stated she will.

## 2023-04-19 NOTE — Telephone Encounter (Signed)
Pt has an appt schedule on 12/2; do you want to see her sooner?

## 2023-04-20 ENCOUNTER — Other Ambulatory Visit: Payer: Self-pay | Admitting: Student in an Organized Health Care Education/Training Program

## 2023-04-20 NOTE — Telephone Encounter (Signed)
Call from pt's daughter, Zella Ball, stated the reason why pt is not eating her usual foods is b/c she lost her dentures. They are in the process of getting her another set. She's eating mainly soft foods. Zella Ball stated pt is doing well and she wanted to let Dr Oswaldo Done be made aware.

## 2023-04-20 NOTE — Telephone Encounter (Signed)
Next appt scheduled 12/2 with PCP. 

## 2023-04-26 DIAGNOSIS — M810 Age-related osteoporosis without current pathological fracture: Secondary | ICD-10-CM | POA: Diagnosis not present

## 2023-04-26 DIAGNOSIS — I5030 Unspecified diastolic (congestive) heart failure: Secondary | ICD-10-CM | POA: Diagnosis not present

## 2023-04-26 DIAGNOSIS — S32019D Unspecified fracture of first lumbar vertebra, subsequent encounter for fracture with routine healing: Secondary | ICD-10-CM | POA: Diagnosis not present

## 2023-04-26 DIAGNOSIS — M549 Dorsalgia, unspecified: Secondary | ICD-10-CM | POA: Diagnosis not present

## 2023-04-26 DIAGNOSIS — E785 Hyperlipidemia, unspecified: Secondary | ICD-10-CM | POA: Diagnosis not present

## 2023-04-26 DIAGNOSIS — W19XXXD Unspecified fall, subsequent encounter: Secondary | ICD-10-CM | POA: Diagnosis not present

## 2023-04-26 DIAGNOSIS — G9009 Other idiopathic peripheral autonomic neuropathy: Secondary | ICD-10-CM | POA: Diagnosis not present

## 2023-04-26 DIAGNOSIS — I130 Hypertensive heart and chronic kidney disease with heart failure and stage 1 through stage 4 chronic kidney disease, or unspecified chronic kidney disease: Secondary | ICD-10-CM | POA: Diagnosis not present

## 2023-04-26 DIAGNOSIS — N1831 Chronic kidney disease, stage 3a: Secondary | ICD-10-CM | POA: Diagnosis not present

## 2023-04-26 DIAGNOSIS — G8929 Other chronic pain: Secondary | ICD-10-CM | POA: Diagnosis not present

## 2023-04-27 ENCOUNTER — Other Ambulatory Visit: Payer: Self-pay | Admitting: Student in an Organized Health Care Education/Training Program

## 2023-05-02 ENCOUNTER — Encounter: Payer: 59 | Admitting: Student in an Organized Health Care Education/Training Program

## 2023-05-03 DIAGNOSIS — S32019D Unspecified fracture of first lumbar vertebra, subsequent encounter for fracture with routine healing: Secondary | ICD-10-CM | POA: Diagnosis not present

## 2023-05-03 DIAGNOSIS — N1831 Chronic kidney disease, stage 3a: Secondary | ICD-10-CM | POA: Diagnosis not present

## 2023-05-03 DIAGNOSIS — G9009 Other idiopathic peripheral autonomic neuropathy: Secondary | ICD-10-CM | POA: Diagnosis not present

## 2023-05-03 DIAGNOSIS — M810 Age-related osteoporosis without current pathological fracture: Secondary | ICD-10-CM | POA: Diagnosis not present

## 2023-05-03 DIAGNOSIS — I5030 Unspecified diastolic (congestive) heart failure: Secondary | ICD-10-CM | POA: Diagnosis not present

## 2023-05-03 DIAGNOSIS — W19XXXD Unspecified fall, subsequent encounter: Secondary | ICD-10-CM | POA: Diagnosis not present

## 2023-05-03 DIAGNOSIS — G8929 Other chronic pain: Secondary | ICD-10-CM | POA: Diagnosis not present

## 2023-05-03 DIAGNOSIS — E785 Hyperlipidemia, unspecified: Secondary | ICD-10-CM | POA: Diagnosis not present

## 2023-05-03 DIAGNOSIS — M549 Dorsalgia, unspecified: Secondary | ICD-10-CM | POA: Diagnosis not present

## 2023-05-03 DIAGNOSIS — I130 Hypertensive heart and chronic kidney disease with heart failure and stage 1 through stage 4 chronic kidney disease, or unspecified chronic kidney disease: Secondary | ICD-10-CM | POA: Diagnosis not present

## 2023-05-11 DIAGNOSIS — M170 Bilateral primary osteoarthritis of knee: Secondary | ICD-10-CM | POA: Diagnosis not present

## 2023-05-13 ENCOUNTER — Other Ambulatory Visit: Payer: Self-pay | Admitting: Student in an Organized Health Care Education/Training Program

## 2023-05-22 DIAGNOSIS — N1831 Chronic kidney disease, stage 3a: Secondary | ICD-10-CM | POA: Diagnosis not present

## 2023-05-22 DIAGNOSIS — M170 Bilateral primary osteoarthritis of knee: Secondary | ICD-10-CM | POA: Diagnosis not present

## 2023-05-22 DIAGNOSIS — S32019D Unspecified fracture of first lumbar vertebra, subsequent encounter for fracture with routine healing: Secondary | ICD-10-CM | POA: Diagnosis not present

## 2023-05-22 DIAGNOSIS — R600 Localized edema: Secondary | ICD-10-CM | POA: Diagnosis not present

## 2023-05-23 ENCOUNTER — Encounter: Payer: Self-pay | Admitting: Student in an Organized Health Care Education/Training Program

## 2023-05-23 ENCOUNTER — Ambulatory Visit (INDEPENDENT_AMBULATORY_CARE_PROVIDER_SITE_OTHER): Payer: 59 | Admitting: Student in an Organized Health Care Education/Training Program

## 2023-05-23 VITALS — BP 132/71 | HR 62 | Temp 98.2°F | Ht 67.0 in | Wt 180.2 lb

## 2023-05-23 DIAGNOSIS — E559 Vitamin D deficiency, unspecified: Secondary | ICD-10-CM

## 2023-05-23 DIAGNOSIS — I10 Essential (primary) hypertension: Secondary | ICD-10-CM | POA: Diagnosis not present

## 2023-05-23 DIAGNOSIS — M810 Age-related osteoporosis without current pathological fracture: Secondary | ICD-10-CM

## 2023-05-23 DIAGNOSIS — Z9181 History of falling: Secondary | ICD-10-CM | POA: Diagnosis not present

## 2023-05-23 MED ORDER — VITAMIN D3 50 MCG (2000 UT) PO CAPS
2000.0000 [IU] | ORAL_CAPSULE | Freq: Every day | ORAL | 1 refills | Status: DC
Start: 2023-05-23 — End: 2023-08-22

## 2023-05-23 NOTE — Assessment & Plan Note (Signed)
L1 compression fracture symptomatically doing much better, less pain, doing better with functional improvements after physical therapy.  We are planning to continue with alendronate 10 mg daily, this is her second 5-year course of treatment.  Had some vitamin D for deficiency, supplementing with daily vitamin D will recheck vitamin D levels today.

## 2023-05-23 NOTE — Assessment & Plan Note (Signed)
Patient with frailty of advanced age, deconditioning, at risk for falls.  She completed 6 weeks of home physical therapy and did very well.  Reports improved quality of life and independence and doing activities of daily living.  She ambulates with the assistance of an upright walker.  Uses a Hoyer lift to help her transfer into bed.  Has great support from her family.  Will follow-up in 3 months, talked about continuing home exercises now that physical therapy has finished.  If we have more deconditioning in the future, can do another round of physical therapy but hopefully she can maintain current functional level.

## 2023-05-23 NOTE — Progress Notes (Signed)
Established Patient Office Visit  Subjective   Patient ID: [name], female    DOB: 02/26/1941  Age: 82 y.o. MRN: [id number]  Chief Complaint  Patient presents with   Follow-up    HPI  82 year old person with frailty of advanced age, hypertension, CKD 3A here for follow-up after a fall, emergency department visit, found to have an L1 vertebral compression fracture.  Doing much better since I saw her about 2 months ago.  Discomfort in her back is much improved.  She was able to complete 6 weeks of at home physical therapy and reports improved activities of daily living and independence.  She is ambulating with the use of a walker, getting out of the house about every day.  Reports good adherence with medications, except did have some confusion about continuing lisinopril, she stopped that about 2 months ago, also did not refill vitamin D and so stopped that medication about 1 month ago.  No falls at home.  They did get a Hoyer lift which they used to help her transfer into bed because of leg weakness.  She uses a bed rail to help lower the risk of rolling out of bed at night.  Great support from her family that live with her.  No fevers or chills.  Has some congestion in her sinuses.    Objective:     BP 132/71 (BP Location: Left Arm, Patient Position: Sitting, Cuff Size: Small)   Pulse 62   Temp 98.2 F (36.8 C) (Oral)   Ht 5\' 7"  (1.702 m)   Wt 180 lb 3.2 oz (81.7 kg)   LMP  (LMP Unknown)   SpO2 100%   BMI 28.22 kg/m    Physical Exam  Gen: Well-appearing, no distress Ears: Bilaterally impacted with a moderate amount of cerumen, cannot visualize the tympanic membranes Lungs: Normal work of breathing, clear to auscultation bilaterally Extremities: Warm well-perfused, 1+ chronic nonpitting edema in both legs Neuro: Alert, conversational, full strength in the upper and lower extremities, unsteady ambulation, currently using a wheelchair due to risk of falls    Assessment &  Plan:   Problem List Items Addressed This Visit       High   Osteoporosis - Primary (Chronic)    L1 compression fracture symptomatically doing much better, less pain, doing better with functional improvements after physical therapy.  We are planning to continue with alendronate 10 mg daily, this is her second 5-year course of treatment.  Had some vitamin D for deficiency, supplementing with daily vitamin D will recheck vitamin D levels today.      Relevant Medications   Cholecalciferol (VITAMIN D3) 50 MCG (2000 UT) capsule     Medium    Risk for falls (Chronic)    Patient with frailty of advanced age, deconditioning, at risk for falls.  She completed 6 weeks of home physical therapy and did very well.  Reports improved quality of life and independence and doing activities of daily living.  She ambulates with the assistance of an upright walker.  Uses a Hoyer lift to help her transfer into bed.  Has great support from her family.  Will follow-up in 3 months, talked about continuing home exercises now that physical therapy has finished.  If we have more deconditioning in the future, can do another round of physical therapy but hopefully she can maintain current functional level.      Essential hypertension (Chronic)   Relevant Orders   BMP8+Anion Gap  Low   Vitamin D deficiency (Chronic)    Tolerated about 2 months of oral vitamin D replacement on a daily basis.  No side effects.  Some confusion, did not pick up her refill about a month ago.  I am going to recheck a vitamin D today.  I sent a refill for 2000 units of vitamin D3 to be used daily.  Would like to avoid the high dose replacement if we can.      Relevant Orders   Vitamin D (25 hydroxy)    Return in about 3 months (around 08/21/2023).    Tyson Alias, MD

## 2023-05-23 NOTE — Assessment & Plan Note (Signed)
Tolerated about 2 months of oral vitamin D replacement on a daily basis.  No side effects.  Some confusion, did not pick up her refill about a month ago.  I am going to recheck a vitamin D today.  I sent a refill for 2000 units of vitamin D3 to be used daily.  Would like to avoid the high dose replacement if we can.

## 2023-05-24 LAB — BMP8+ANION GAP
Anion Gap: 15 mmol/L (ref 10.0–18.0)
BUN/Creatinine Ratio: 19 (ref 12–28)
BUN: 17 mg/dL (ref 8–27)
CO2: 26 mmol/L (ref 20–29)
Calcium: 9.6 mg/dL (ref 8.7–10.3)
Chloride: 102 mmol/L (ref 96–106)
Creatinine, Ser: 0.9 mg/dL (ref 0.57–1.00)
Glucose: 92 mg/dL (ref 70–99)
Potassium: 3.5 mmol/L (ref 3.5–5.2)
Sodium: 143 mmol/L (ref 134–144)
eGFR: 64 mL/min/{1.73_m2} (ref >59)

## 2023-05-24 LAB — VITAMIN D 25 HYDROXY (VIT D DEFICIENCY, FRACTURES): Vit D, 25-Hydroxy: 17.6 ng/mL — ABNORMAL LOW (ref 30.0–100.0)

## 2023-05-28 ENCOUNTER — Other Ambulatory Visit: Payer: Self-pay | Admitting: Student in an Organized Health Care Education/Training Program

## 2023-05-30 ENCOUNTER — Encounter: Payer: Self-pay | Admitting: Student in an Organized Health Care Education/Training Program

## 2023-06-06 ENCOUNTER — Other Ambulatory Visit: Payer: Self-pay | Admitting: Student in an Organized Health Care Education/Training Program

## 2023-06-10 DIAGNOSIS — M170 Bilateral primary osteoarthritis of knee: Secondary | ICD-10-CM | POA: Diagnosis not present

## 2023-06-13 ENCOUNTER — Encounter: Payer: Self-pay | Admitting: Podiatry

## 2023-06-13 ENCOUNTER — Ambulatory Visit (INDEPENDENT_AMBULATORY_CARE_PROVIDER_SITE_OTHER): Payer: 59 | Admitting: Podiatry

## 2023-06-13 DIAGNOSIS — G609 Hereditary and idiopathic neuropathy, unspecified: Secondary | ICD-10-CM

## 2023-06-13 DIAGNOSIS — M79675 Pain in left toe(s): Secondary | ICD-10-CM

## 2023-06-13 DIAGNOSIS — M79674 Pain in right toe(s): Secondary | ICD-10-CM

## 2023-06-13 DIAGNOSIS — B351 Tinea unguium: Secondary | ICD-10-CM

## 2023-06-13 NOTE — Progress Notes (Signed)
Subjective:  Patient ID: [name], female    DOB: June 26, 1940,   MRN: [id number]  No chief complaint on file.   82 y.o. female presents for concern of thickened elongated and painful nails that are difficult to trim. Requesting to have them trimmed today.   [name]   PCP:  Tyson Alias, MD     . Denies any other pedal complaints. Denies n/v/f/c.   Past Medical History:  Diagnosis Date   Abnormal electromyogram (EMG)    Low grade right S1 radiculopathy 7/93.   Anemia 05/01/2014   Diastolic dysfunction    a. 08/2012 Echo: EF 60-65%, Gr 2 DD, triv MR, PASP .   Distal radius fracture, right 12/22/2015   DJD (degenerative joint disease)    Elevated alkaline phosphatase level    Bone scan 08/2004 showed no evidence of abnormal bony activity.[name]   Herniated lumbar intervertebral disc    S/P L4-5 & L5-S1 laminotomy, foraminotomy, and L5-S1 diskectomy by Dr. Trey Sailors 11/01/2000.   Hyperlipemia    Hypertension    Iron deficiency anemia    Osteopenia    A DEXA bone density scan done 02/13/2009 showed a left femur neck young adult T-score of -1.6 (osteopenia), a right femur neck young adult T-score of -1.6 (osteopenia), and an AP spine young adult T-score of 1.3 (normal).  Her FRAX score gave an estimated 10 year probability of 6.6% for major osteoporotic fracture and 0.8% for hip fracture.   Peripheral neuropathy    Rotator cuff syndrome of right shoulder    S/P right shoulder arthroscopic debridement of a massive rotator cuff tear, greater tuberosity and resection of subacromial spur by Dr. Gean Birchwood 07/07/2005.   Stroke [facility]) 04/2013   SVT (supraventricular tachycardia) (HCC)    PSVT   Vitamin D deficiency     Objective:  Physical Exam: Vascular: DP/PT pulses 2/4 bilateral. CFT <3 seconds. Absent hair growth on digits. Edema noted to bilateral lower extremities. Xerosis noted bilaterally.  Skin. No lacerations or abrasions bilateral feet. Nails 1-5 bilateral  are thickened  discolored  with subungual debris. Hyperkeratotic area noted to distal second digit  healed.  No purulence or eryhtema but edema and discolroation noted to the second digit but has improved. Maren Reamer still present but significantly improved.  Musculoskeletal: MMT 5/5 bilateral lower extremities in DF, PF, Inversion and Eversion. Deceased ROM in DF of ankle joint.  Neurological: Sensation intact to light touch. Protective sensation diminished bilateral.   +---------+------------------+-----+--------+--------+  Right   Rt Pressure (mmHg)IndexWaveformComment   +---------+------------------+-----+--------+--------+  Brachial 153                                      +---------+------------------+-----+--------+--------+  PTA     181               1.18 biphasic          +---------+------------------+-----+--------+--------+  DP      175               1.14 biphasic          +---------+------------------+-----+--------+--------+  Great Toe125               0.82 Normal            +---------+------------------+-----+--------+--------+   +---------+------------------+-----+---------+-------+  Left    Lt Pressure (mmHg)IndexWaveform Comment  +---------+------------------+-----+---------+-------+  Brachial 150                                      +---------+------------------+-----+---------+-------+  PTA     162               1.06 triphasic         +---------+------------------+-----+---------+-------+  DP      173               1.13 triphasic         +---------+------------------+-----+---------+-------+  Great Toe108               0.71 Normal            +---------+------------------+-----+---------+-------+   +-------+-----------+-----------+------------+------------+  ABI/TBIToday's ABIToday's TBIPrevious ABIPrevious TBI  +-------+-----------+-----------+------------+------------+  Right 1.18       0.82                                  +-------+-----------+-----------+------------+------------+  Left  1.13       0.71                                 +-------+-----------+-----------+------------+------------+           Summary:  Right: Resting right ankle-brachial index is within normal range. The  right toe-brachial index is normal.    Left: Resting left ankle-brachial index is within normal range. The left  toe-brachial index is normal.   MRI left foot  IMPRESSION: 1. Bone marrow edema of the distal phalanx of the second digit concerning for acute osteomyelitis. 2. Skin irregularity and edema about the tip of the second digit consistent with ulceration. 3. Increased signal of the plantar muscles which may be secondary to muscle strain or myositis. 4. Chronic fractures of the second, third and fourth metatarsal necks. 5. Soft tissue edema about the dorsum of the foot without evidence of fluid collection or abscess.    Assessment:   1. Pain due to onychomycosis of toenails of both feet   2. Idiopathic peripheral neuropathy         Plan:  Patient was evaluated and treated and all questions answered. -Discussed and educated patient on  foot care, especially with  regards to the vascular, neurological and musculoskeletal systems.  -Stressed the importance of good glycemic control and the detriment of not  controlling glucose levels in relation to the foot. -Discussed supportive shoes at all times and checking feet regularly.  -Mechanically debrided all nails 1-5 bilateral using sterile nail nipper and filed with dremel without incident  -Answered all patient questions -Patient to return  in 3 months for at risk foot care -Patient advised to call the office if any problems or questions arise in the meantime.     No follow-ups on file.   Louann Sjogren, DPM

## 2023-06-21 ENCOUNTER — Encounter: Payer: Self-pay | Admitting: *Deleted

## 2023-06-29 DIAGNOSIS — M170 Bilateral primary osteoarthritis of knee: Secondary | ICD-10-CM | POA: Diagnosis not present

## 2023-06-29 DIAGNOSIS — S32019D Unspecified fracture of first lumbar vertebra, subsequent encounter for fracture with routine healing: Secondary | ICD-10-CM | POA: Diagnosis not present

## 2023-06-29 DIAGNOSIS — N1831 Chronic kidney disease, stage 3a: Secondary | ICD-10-CM | POA: Diagnosis not present

## 2023-06-29 DIAGNOSIS — R600 Localized edema: Secondary | ICD-10-CM | POA: Diagnosis not present

## 2023-07-07 ENCOUNTER — Other Ambulatory Visit: Payer: Self-pay | Admitting: Student in an Organized Health Care Education/Training Program

## 2023-07-08 NOTE — Telephone Encounter (Signed)
Medication discontinued 03/21/2023

## 2023-07-12 ENCOUNTER — Other Ambulatory Visit: Payer: Self-pay | Admitting: Student in an Organized Health Care Education/Training Program

## 2023-07-14 ENCOUNTER — Encounter: Payer: Self-pay | Admitting: *Deleted

## 2023-07-21 ENCOUNTER — Other Ambulatory Visit: Payer: Self-pay | Admitting: Student in an Organized Health Care Education/Training Program

## 2023-07-30 DIAGNOSIS — M170 Bilateral primary osteoarthritis of knee: Secondary | ICD-10-CM | POA: Diagnosis not present

## 2023-07-30 DIAGNOSIS — N1831 Chronic kidney disease, stage 3a: Secondary | ICD-10-CM | POA: Diagnosis not present

## 2023-07-30 DIAGNOSIS — R600 Localized edema: Secondary | ICD-10-CM | POA: Diagnosis not present

## 2023-07-30 DIAGNOSIS — S32019D Unspecified fracture of first lumbar vertebra, subsequent encounter for fracture with routine healing: Secondary | ICD-10-CM | POA: Diagnosis not present

## 2023-08-22 ENCOUNTER — Other Ambulatory Visit: Payer: Self-pay

## 2023-08-22 DIAGNOSIS — M810 Age-related osteoporosis without current pathological fracture: Secondary | ICD-10-CM

## 2023-08-22 MED ORDER — ATENOLOL 50 MG PO TABS: 50.0000 mg | ORAL_TABLET | Freq: Every day | ORAL | 0 refills | Status: DC

## 2023-08-22 MED ORDER — CLOPIDOGREL BISULFATE 75 MG PO TABS: 75.0000 mg | ORAL_TABLET | Freq: Every day | ORAL | 0 refills | Status: DC

## 2023-08-22 MED ORDER — LISINOPRIL 20 MG PO TABS: 20.0000 mg | ORAL_TABLET | Freq: Every day | ORAL | 0 refills | Status: DC

## 2023-08-22 MED ORDER — VITAMIN D3 50 MCG (2000 UT) PO CAPS
2000.0000 [IU] | ORAL_CAPSULE | Freq: Every day | ORAL | 0 refills | Status: AC
Start: 2023-08-22 — End: ?

## 2023-08-22 NOTE — Telephone Encounter (Signed)
 Refilled, plans to follow with Dr Oswaldo Done.

## 2023-08-23 ENCOUNTER — Other Ambulatory Visit (HOSPITAL_COMMUNITY): Payer: Self-pay

## 2023-08-27 DIAGNOSIS — R600 Localized edema: Secondary | ICD-10-CM | POA: Diagnosis not present

## 2023-08-27 DIAGNOSIS — S32019D Unspecified fracture of first lumbar vertebra, subsequent encounter for fracture with routine healing: Secondary | ICD-10-CM | POA: Diagnosis not present

## 2023-08-27 DIAGNOSIS — N1831 Chronic kidney disease, stage 3a: Secondary | ICD-10-CM | POA: Diagnosis not present

## 2023-08-27 DIAGNOSIS — M170 Bilateral primary osteoarthritis of knee: Secondary | ICD-10-CM | POA: Diagnosis not present

## 2023-09-06 ENCOUNTER — Other Ambulatory Visit: Payer: Self-pay | Admitting: Student in an Organized Health Care Education/Training Program

## 2023-09-06 DIAGNOSIS — M810 Age-related osteoporosis without current pathological fracture: Secondary | ICD-10-CM

## 2023-09-06 NOTE — Telephone Encounter (Signed)
 Copied from CRM [id number]. Topic: Clinical - Medication Refill >> Sep 06, 2023  5:47 PM Denese Killings wrote: Most Recent Primary Care Visit:  Provider: Tyson Alias  Department: [facility]  Visit Type: OPEN ESTABLISHED  Date: 05/23/2023  Medication: atenolol (TENORMIN) 50 MG tablet, Cholecalciferol (VITAMIN D3) 50 MCG (2000 UT) capsule, clopidogrel (PLAVIX) 75 MG tablet   Has the patient contacted their pharmacy? yes (Agent: If no, request that the patient contact the pharmacy for the refill. If patient does not wish to contact the pharmacy document the reason why and proceed with request.) (Agent: If yes, when and what did the pharmacy advise?)  Is this the correct pharmacy for this prescription? Yes If no, delete pharmacy and type the correct one.  This is the patient's preferred pharmacy:  [id number] - [place], [place] - [address] AT [facility] [address] [place] [address] Phone: [phone] Fax: [phone]   Has the prescription been filled recently? No  Is the patient out of the medication? Yes  Has the patient been seen for an appointment in the last year OR does the patient have an upcoming appointment? Yes  Can we respond through MyChart? No  Agent: Please be advised that Rx refills may take up to 3 business days. We ask that you follow-up with your pharmacy.

## 2023-09-09 ENCOUNTER — Ambulatory Visit: Payer: 59 | Admitting: Student in an Organized Health Care Education/Training Program

## 2023-09-09 ENCOUNTER — Encounter: Payer: Self-pay | Admitting: Student in an Organized Health Care Education/Training Program

## 2023-09-09 VITALS — BP 112/60 | HR 66 | Temp 98.1°F | Wt 177.0 lb

## 2023-09-09 DIAGNOSIS — I998 Other disorder of circulatory system: Secondary | ICD-10-CM | POA: Diagnosis not present

## 2023-09-09 DIAGNOSIS — I5032 Chronic diastolic (congestive) heart failure: Secondary | ICD-10-CM | POA: Diagnosis not present

## 2023-09-09 DIAGNOSIS — I10 Essential (primary) hypertension: Secondary | ICD-10-CM

## 2023-09-09 DIAGNOSIS — R600 Localized edema: Secondary | ICD-10-CM

## 2023-09-09 DIAGNOSIS — R2681 Unsteadiness on feet: Secondary | ICD-10-CM

## 2023-09-09 DIAGNOSIS — M810 Age-related osteoporosis without current pathological fracture: Secondary | ICD-10-CM

## 2023-09-09 DIAGNOSIS — E559 Vitamin D deficiency, unspecified: Secondary | ICD-10-CM

## 2023-09-09 NOTE — Assessment & Plan Note (Signed)
 Chronic and stable.  Appears well compensated today.  No need for Lasix at this time.  Plan to continue atenolol 50 mg daily, lisinopril 20 mg daily.

## 2023-09-09 NOTE — Assessment & Plan Note (Signed)
 Chronic and stable.  She had a fragility fracture in 2017 and we treated her for 5 years with alendronate.  Then she had a L1 compression fracture in 2024 so we restarted another 5-year course of alendronate.  Tolerating the medication well.  Also using daily vitamin D.

## 2023-09-09 NOTE — Progress Notes (Addendum)
 Established Patient Office Visit  Subjective   Patient ID: [name], female    DOB: 17-Jul-1940  Age: 83 y.o. MRN: [id number]  Chief Complaint  Patient presents with   Transitions Of Care    Would like to discuss medications but no concerns  Needs refill for plavix     HPI  83 year old person here for management of hypertension and osteoporosis.  Doing well since I last saw her.  She lives at home independently with her daughter and other family members.  There is someone around all the time.  No falls at home.  She is independent in activities of daily living.  Has some help from her family members as needed.  No recent illness, ED visits, or hospitalizations.  Denies chest pain or difficulty breathing.  Reports good adherence with medications and denies side effects.  Has not used Plavix in about a month, reports that her bottle was empty, pharmacy says they gave her a new bottle in early February, but seems this may have been lost.    Objective:     BP 112/60   Pulse 66   Temp 98.1 F (36.7 C) (Temporal)   Wt 177 lb (80.3 kg)   LMP  (LMP Unknown)   SpO2 97%   BMI 27.72 kg/m    Physical Exam  General: Well-appearing Neck: Normal Heart: Regular, 2 out of 6 early systolic murmur at the left upper sternal border Lungs: Clear to auscultation throughout, no crackles Extremities: Warm, well-perfused, compression stockings in place, 1+ nonpitting edema bilaterally Neuro: Alert, conversational, diffusely diminished strength in upper and lower extremities with sarcopenia and frailty of advanced age    Assessment & Plan:   Problem List Items Addressed This Visit       High   Osteoporosis (Chronic)   Chronic and stable.  She had a fragility fracture in 2017 and we treated her for 5 years with alendronate.  Then she had a L1 compression fracture in 2024 so we restarted another 5-year course of alendronate.  Tolerating the medication well.  Also using daily vitamin D.       Bilateral leg edema due to diastolic dysfunction (Chronic)   Chronic and stable.  Well-controlled with supportive care with compression stockings.  We talked about more ambulation and set a goal of daily walking.        Medium    Essential hypertension (Chronic)   Chronic and stable.  Blood pressure well-controlled today at 112/60.  Plan to continue chlorthalidone and lisinopril 20 mg.  Will check renal function in about 6 months.      Chronic diastolic congestive heart failure (HCC) (Chronic)   Chronic and stable.  Appears well compensated today.  No need for Lasix at this time.  Plan to continue atenolol 50 mg daily, lisinopril 20 mg daily.      Ischemic vascular disease - Primary (Chronic)   Chronic and stable.  History of a TIA in 2014.  Doing well with no recent ischemic events.  She has misplaced her bottle of Plavix and insurance will not refill early.  She is doing a look again in her home, I told her if she cannot find the Plavix it can be replaced with aspirin 81 mg daily.  Should be able to refill Plavix again in early May.  Will continue with atorvastatin 80 mg daily and atenolol 50 mg daily for ischemic vascular disease.      Unsteady gait when walking   Patient  with an unsteady gait while walking.  This has been a chronic issue, multifactorial due to advanced age, frailty, osteoarthritis of both knees, and bilateral leg edema due to chronic heart failure with preserved ejection fraction.  She is at increased risk for falls.  I am going to order home health PT to work on her gait training, to improve transfers, and to improve the use of assist devices.  Because of the above conditions it is a taxing effort for the patient to leave her home.        Low   Vitamin D deficiency (Chronic)    Return in about 6 months (around 03/11/2024).    Tyson Alias, MD

## 2023-09-09 NOTE — Assessment & Plan Note (Signed)
 Chronic and stable.  History of a TIA in 2014.  Doing well with no recent ischemic events.  She has misplaced her bottle of Plavix and insurance will not refill early.  She is doing a look again in her home, I told her if she cannot find the Plavix it can be replaced with aspirin 81 mg daily.  Should be able to refill Plavix again in early May.  Will continue with atorvastatin 80 mg daily and atenolol 50 mg daily for ischemic vascular disease.

## 2023-09-09 NOTE — Patient Instructions (Signed)
 If you do not find the bottle of clopidogrel at home, you can take aspirin 81 mg once daily instead.

## 2023-09-09 NOTE — Assessment & Plan Note (Signed)
 Chronic and stable.  Well-controlled with supportive care with compression stockings.  We talked about more ambulation and set a goal of daily walking.

## 2023-09-09 NOTE — Assessment & Plan Note (Signed)
 Chronic and stable.  Blood pressure well-controlled today at 112/60.  Plan to continue chlorthalidone and lisinopril 20 mg.  Will check renal function in about 6 months.

## 2023-09-12 ENCOUNTER — Ambulatory Visit: Payer: 59 | Admitting: Podiatry

## 2023-09-16 ENCOUNTER — Telehealth: Payer: Self-pay

## 2023-09-16 NOTE — Telephone Encounter (Signed)
 Patient will need an appointment for "runny nose"

## 2023-09-16 NOTE — Telephone Encounter (Signed)
 Copied from CRM [id number]. Topic: Clinical - Medication Question >> Sep 16, 2023 11:33 AM Sonny Dandy B wrote: Reason for CRM: pt called to request medication for sinus problem pt states she has had a running nose for 3 weeks. Please call pt back [phone]

## 2023-09-19 NOTE — Telephone Encounter (Signed)
 Called, no answer.

## 2023-09-23 ENCOUNTER — Ambulatory Visit: Admitting: Student in an Organized Health Care Education/Training Program

## 2023-09-27 ENCOUNTER — Encounter: Payer: Self-pay | Admitting: Student in an Organized Health Care Education/Training Program

## 2023-09-27 DIAGNOSIS — M170 Bilateral primary osteoarthritis of knee: Secondary | ICD-10-CM | POA: Diagnosis not present

## 2023-09-27 DIAGNOSIS — S32019D Unspecified fracture of first lumbar vertebra, subsequent encounter for fracture with routine healing: Secondary | ICD-10-CM | POA: Diagnosis not present

## 2023-09-27 DIAGNOSIS — R600 Localized edema: Secondary | ICD-10-CM | POA: Diagnosis not present

## 2023-09-27 DIAGNOSIS — N1831 Chronic kidney disease, stage 3a: Secondary | ICD-10-CM | POA: Diagnosis not present

## 2023-09-27 NOTE — Addendum Note (Signed)
 Addended by: Erlinda Hong T on: 09/27/2023 04:30 PM   Modules accepted: Orders

## 2023-09-27 NOTE — Telephone Encounter (Signed)
**Note De-identified  [place] Obfuscation** Please advise 

## 2023-09-27 NOTE — Assessment & Plan Note (Signed)
 Patient with an unsteady gait while walking.  This has been a chronic issue, multifactorial due to advanced age, frailty, osteoarthritis of both knees, and bilateral leg edema due to chronic heart failure with preserved ejection fraction.  She is at increased risk for falls.  I am going to order home health PT to work on her gait training, to improve transfers, and to improve the use of assist devices.  Because of the above conditions it is a taxing effort for the patient to leave her home.

## 2023-09-27 NOTE — Telephone Encounter (Signed)
 Thank you. I have addended my note from March and added the home health PT referral to it. I tried calling the patient, and left a VM.

## 2023-09-28 ENCOUNTER — Encounter: Payer: Self-pay | Admitting: Student in an Organized Health Care Education/Training Program

## 2023-09-28 ENCOUNTER — Ambulatory Visit (INDEPENDENT_AMBULATORY_CARE_PROVIDER_SITE_OTHER): Admitting: Podiatry

## 2023-09-28 DIAGNOSIS — B351 Tinea unguium: Secondary | ICD-10-CM

## 2023-09-28 DIAGNOSIS — G609 Hereditary and idiopathic neuropathy, unspecified: Secondary | ICD-10-CM | POA: Diagnosis not present

## 2023-09-28 DIAGNOSIS — M79674 Pain in right toe(s): Secondary | ICD-10-CM

## 2023-09-28 DIAGNOSIS — M79675 Pain in left toe(s): Secondary | ICD-10-CM | POA: Diagnosis not present

## 2023-09-28 NOTE — Progress Notes (Signed)
 Subjective:  Patient ID: [name], female    DOB: [birth year],   MRN: [id number]  No chief complaint on file.   83 y.o. female presents for concern of thickened elongated and painful nails that are difficult to trim. Requesting to have them trimmed today.   [name]   PCP:  Tyson Alias, MD     . Denies any other pedal complaints. Denies n/v/f/c.   Past Medical History:  Diagnosis Date   Abnormal electromyogram (EMG)    Low grade right S1 radiculopathy 7/93.   Anemia 05/01/2014   Diastolic dysfunction    a. 08/2012 Echo: EF 60-65%, Gr 2 DD, triv MR, PASP .   Distal radius fracture, right 12/22/2015   DJD (degenerative joint disease)    Elevated alkaline phosphatase level    Bone scan 08/2004 showed no evidence of abnormal bony activity.[name]   Herniated lumbar intervertebral disc    S/P L4-5 & L5-S1 laminotomy, foraminotomy, and L5-S1 diskectomy by Dr. Trey Sailors 11/01/2000.   Hyperlipemia    Hypertension    Iron deficiency anemia    Osteopenia    A DEXA bone density scan done 02/13/2009 showed a left femur neck young adult T-score of -1.6 (osteopenia), a right femur neck young adult T-score of -1.6 (osteopenia), and an AP spine young adult T-score of 1.3 (normal).  Her FRAX score gave an estimated 10 year probability of 6.6% for major osteoporotic fracture and 0.8% for hip fracture.   Peripheral neuropathy    Rotator cuff syndrome of right shoulder    S/P right shoulder arthroscopic debridement of a massive rotator cuff tear, greater tuberosity and resection of subacromial spur by Dr. Gean Birchwood 07/07/2005.   Stroke [facility]) 04/2013   SVT (supraventricular tachycardia) (HCC)    PSVT   Vitamin D deficiency     Objective:  Physical Exam: Vascular: DP/PT pulses 2/4 bilateral. CFT <3 seconds. Absent hair growth on digits. Edema noted to bilateral lower extremities. Xerosis noted bilaterally.  Skin. No lacerations or abrasions bilateral feet. Nails 1-5 bilateral  are thickened  discolored  with subungual debris. Hyperkeratotic area noted to distal second digit  healed.  No purulence or eryhtema but edema and discolroation noted to the second digit but has improved. Maren Reamer still present but significantly improved.  Musculoskeletal: MMT 5/5 bilateral lower extremities in DF, PF, Inversion and Eversion. Deceased ROM in DF of ankle joint.  Neurological: Sensation intact to light touch. Protective sensation diminished bilateral.   +---------+------------------+-----+--------+--------+  Right   Rt Pressure (mmHg)IndexWaveformComment   +---------+------------------+-----+--------+--------+  Brachial 153                                      +---------+------------------+-----+--------+--------+  PTA     181               1.18 biphasic          +---------+------------------+-----+--------+--------+  DP      175               1.14 biphasic          +---------+------------------+-----+--------+--------+  Great Toe125               0.82 Normal            +---------+------------------+-----+--------+--------+   +---------+------------------+-----+---------+-------+  Left    Lt Pressure (mmHg)IndexWaveform Comment  +---------+------------------+-----+---------+-------+  Brachial 150                                      +---------+------------------+-----+---------+-------+  PTA     162               1.06 triphasic         +---------+------------------+-----+---------+-------+  DP      173               1.13 triphasic         +---------+------------------+-----+---------+-------+  Great Toe108               0.71 Normal            +---------+------------------+-----+---------+-------+   +-------+-----------+-----------+------------+------------+  ABI/TBIToday's ABIToday's TBIPrevious ABIPrevious TBI  +-------+-----------+-----------+------------+------------+  Right 1.18       0.82                                  +-------+-----------+-----------+------------+------------+  Left  1.13       0.71                                 +-------+-----------+-----------+------------+------------+           Summary:  Right: Resting right ankle-brachial index is within normal range. The  right toe-brachial index is normal.    Left: Resting left ankle-brachial index is within normal range. The left  toe-brachial index is normal.   MRI left foot  IMPRESSION: 1. Bone marrow edema of the distal phalanx of the second digit concerning for acute osteomyelitis. 2. Skin irregularity and edema about the tip of the second digit consistent with ulceration. 3. Increased signal of the plantar muscles which may be secondary to muscle strain or myositis. 4. Chronic fractures of the second, third and fourth metatarsal necks. 5. Soft tissue edema about the dorsum of the foot without evidence of fluid collection or abscess.    Assessment:   1. Pain due to onychomycosis of toenails of both feet   2. Idiopathic peripheral neuropathy          Plan:  Patient was evaluated and treated and all questions answered. -Discussed and educated patient on  foot care, especially with  regards to the vascular, neurological and musculoskeletal systems.  -Stressed the importance of good glycemic control and the detriment of not  controlling glucose levels in relation to the foot. -Discussed supportive shoes at all times and checking feet regularly.  -Mechanically debrided all nails 1-5 bilateral using sterile nail nipper and filed with dremel without incident  -Answered all patient questions -Patient to return  in 3 months for at risk foot care -Patient advised to call the office if any problems or questions arise in the meantime.     Return in about 3 months (around 12/28/2023) for rfc.   Louann Sjogren, DPM

## 2023-10-04 DIAGNOSIS — I5032 Chronic diastolic (congestive) heart failure: Secondary | ICD-10-CM | POA: Diagnosis not present

## 2023-10-04 DIAGNOSIS — N1831 Chronic kidney disease, stage 3a: Secondary | ICD-10-CM | POA: Diagnosis not present

## 2023-10-04 DIAGNOSIS — I998 Other disorder of circulatory system: Secondary | ICD-10-CM | POA: Diagnosis not present

## 2023-10-05 NOTE — Telephone Encounter (Signed)
 Please send this referral request to the correct office as Dr. Gayl Katos is not at the [facility] location but the [place] Location:  Please see information below :  Ether Hercules, MD     PCP - General, Internal Medicine    [phone]     Copied from CRM [id number]. Topic: Referral - Request for Referral >> Oct 04, 2023  4:20 PM Retta Caster wrote: Did the patient discuss referral with their provider in the last year? No (If No - schedule appointment) (If Yes - send message)  Appointment offered? No- Due to [facility] requesting Verbal for 1 week 1/2 week 2/ 1 week 4. Needs call back [phone]  Type of order/referral and detailed reason for visit: Home Health Physical Therapy   Preference of office, provider, location: N/A  If referral order, have you been seen by this specialty before? Yes (If Yes, this issue or another issue? When? Where?  Can we respond through MyChart? Yes

## 2023-10-13 DIAGNOSIS — I998 Other disorder of circulatory system: Secondary | ICD-10-CM | POA: Diagnosis not present

## 2023-10-13 DIAGNOSIS — I5032 Chronic diastolic (congestive) heart failure: Secondary | ICD-10-CM | POA: Diagnosis not present

## 2023-10-13 DIAGNOSIS — N1831 Chronic kidney disease, stage 3a: Secondary | ICD-10-CM | POA: Diagnosis not present

## 2023-10-14 DIAGNOSIS — N1831 Chronic kidney disease, stage 3a: Secondary | ICD-10-CM | POA: Diagnosis not present

## 2023-10-14 DIAGNOSIS — I998 Other disorder of circulatory system: Secondary | ICD-10-CM | POA: Diagnosis not present

## 2023-10-14 DIAGNOSIS — I5032 Chronic diastolic (congestive) heart failure: Secondary | ICD-10-CM | POA: Diagnosis not present

## 2023-10-18 ENCOUNTER — Other Ambulatory Visit: Payer: Self-pay | Admitting: Student in an Organized Health Care Education/Training Program

## 2023-10-18 DIAGNOSIS — I5032 Chronic diastolic (congestive) heart failure: Secondary | ICD-10-CM | POA: Diagnosis not present

## 2023-10-18 DIAGNOSIS — N1831 Chronic kidney disease, stage 3a: Secondary | ICD-10-CM | POA: Diagnosis not present

## 2023-10-18 DIAGNOSIS — I998 Other disorder of circulatory system: Secondary | ICD-10-CM | POA: Diagnosis not present

## 2023-10-20 DIAGNOSIS — E785 Hyperlipidemia, unspecified: Secondary | ICD-10-CM | POA: Diagnosis not present

## 2023-10-20 DIAGNOSIS — B351 Tinea unguium: Secondary | ICD-10-CM | POA: Diagnosis not present

## 2023-10-20 DIAGNOSIS — G8929 Other chronic pain: Secondary | ICD-10-CM | POA: Diagnosis not present

## 2023-10-20 DIAGNOSIS — D631 Anemia in chronic kidney disease: Secondary | ICD-10-CM | POA: Diagnosis not present

## 2023-10-20 DIAGNOSIS — M170 Bilateral primary osteoarthritis of knee: Secondary | ICD-10-CM | POA: Diagnosis not present

## 2023-10-20 DIAGNOSIS — M8008XD Age-related osteoporosis with current pathological fracture, vertebra(e), subsequent encounter for fracture with routine healing: Secondary | ICD-10-CM | POA: Diagnosis not present

## 2023-10-20 DIAGNOSIS — N1831 Chronic kidney disease, stage 3a: Secondary | ICD-10-CM | POA: Diagnosis not present

## 2023-10-20 DIAGNOSIS — I130 Hypertensive heart and chronic kidney disease with heart failure and stage 1 through stage 4 chronic kidney disease, or unspecified chronic kidney disease: Secondary | ICD-10-CM | POA: Diagnosis not present

## 2023-10-20 DIAGNOSIS — I5032 Chronic diastolic (congestive) heart failure: Secondary | ICD-10-CM | POA: Diagnosis not present

## 2023-10-25 DIAGNOSIS — E785 Hyperlipidemia, unspecified: Secondary | ICD-10-CM | POA: Diagnosis not present

## 2023-10-25 DIAGNOSIS — B351 Tinea unguium: Secondary | ICD-10-CM | POA: Diagnosis not present

## 2023-10-25 DIAGNOSIS — I130 Hypertensive heart and chronic kidney disease with heart failure and stage 1 through stage 4 chronic kidney disease, or unspecified chronic kidney disease: Secondary | ICD-10-CM | POA: Diagnosis not present

## 2023-10-25 DIAGNOSIS — M170 Bilateral primary osteoarthritis of knee: Secondary | ICD-10-CM | POA: Diagnosis not present

## 2023-10-25 DIAGNOSIS — I5032 Chronic diastolic (congestive) heart failure: Secondary | ICD-10-CM | POA: Diagnosis not present

## 2023-10-25 DIAGNOSIS — N1831 Chronic kidney disease, stage 3a: Secondary | ICD-10-CM | POA: Diagnosis not present

## 2023-10-25 DIAGNOSIS — D631 Anemia in chronic kidney disease: Secondary | ICD-10-CM | POA: Diagnosis not present

## 2023-10-25 DIAGNOSIS — G8929 Other chronic pain: Secondary | ICD-10-CM | POA: Diagnosis not present

## 2023-10-25 DIAGNOSIS — M8008XD Age-related osteoporosis with current pathological fracture, vertebra(e), subsequent encounter for fracture with routine healing: Secondary | ICD-10-CM | POA: Diagnosis not present

## 2023-10-27 DIAGNOSIS — N1831 Chronic kidney disease, stage 3a: Secondary | ICD-10-CM | POA: Diagnosis not present

## 2023-10-27 DIAGNOSIS — R600 Localized edema: Secondary | ICD-10-CM | POA: Diagnosis not present

## 2023-10-27 DIAGNOSIS — M170 Bilateral primary osteoarthritis of knee: Secondary | ICD-10-CM | POA: Diagnosis not present

## 2023-10-27 DIAGNOSIS — S32019D Unspecified fracture of first lumbar vertebra, subsequent encounter for fracture with routine healing: Secondary | ICD-10-CM | POA: Diagnosis not present

## 2023-11-02 DIAGNOSIS — B351 Tinea unguium: Secondary | ICD-10-CM | POA: Diagnosis not present

## 2023-11-02 DIAGNOSIS — D631 Anemia in chronic kidney disease: Secondary | ICD-10-CM | POA: Diagnosis not present

## 2023-11-02 DIAGNOSIS — I5032 Chronic diastolic (congestive) heart failure: Secondary | ICD-10-CM | POA: Diagnosis not present

## 2023-11-02 DIAGNOSIS — M170 Bilateral primary osteoarthritis of knee: Secondary | ICD-10-CM | POA: Diagnosis not present

## 2023-11-02 DIAGNOSIS — I130 Hypertensive heart and chronic kidney disease with heart failure and stage 1 through stage 4 chronic kidney disease, or unspecified chronic kidney disease: Secondary | ICD-10-CM | POA: Diagnosis not present

## 2023-11-02 DIAGNOSIS — G8929 Other chronic pain: Secondary | ICD-10-CM | POA: Diagnosis not present

## 2023-11-02 DIAGNOSIS — N1831 Chronic kidney disease, stage 3a: Secondary | ICD-10-CM | POA: Diagnosis not present

## 2023-11-02 DIAGNOSIS — M8008XD Age-related osteoporosis with current pathological fracture, vertebra(e), subsequent encounter for fracture with routine healing: Secondary | ICD-10-CM | POA: Diagnosis not present

## 2023-11-02 DIAGNOSIS — E785 Hyperlipidemia, unspecified: Secondary | ICD-10-CM | POA: Diagnosis not present

## 2023-11-07 ENCOUNTER — Telehealth: Payer: Self-pay | Admitting: *Deleted

## 2023-11-07 NOTE — Telephone Encounter (Signed)
 Andriette Banner F CB   11/07/23 11:11 AM Note Please see the message below [facility]:     Copied from CRM #[id number]. Topic: General - Other >> Nov 02, 2023  3:16 PM Suzette B wrote: Reason for CRM: patient is going t o be discharged today from PT due insurance contract changes readmission up next  [phone] Katrina Parma from [facility] may request a need a new face to face order

## 2023-11-07 NOTE — Telephone Encounter (Signed)
 Return call to Myra PT with [facility] - no answer; left message of office's return call.

## 2023-11-07 NOTE — Telephone Encounter (Signed)
 Please see the message below [facility]:   Copied from CRM [id number]. Topic: General - Other >> Nov 02, 2023  3:16 PM Suzette B wrote: Reason for CRM: patient is going t o be discharged today from PT due insurance contract changes readmission up next  [phone] Katrina Parma from [facility] may request a need a new face to face order

## 2023-11-09 ENCOUNTER — Telehealth: Payer: Self-pay | Admitting: *Deleted

## 2023-11-09 NOTE — Telephone Encounter (Signed)
 Copied from CRM [id number]. Topic: Clinical - Home Health Verbal Orders >> Nov 09, 2023  3:00 PM Wynona Hedger wrote: Caller/Agency: [facility] Callback Number: [phone]. Message can be left its a secure line Service Requested: Physical Therapy Frequency: once a week for 5 weeks Any new concerns about the patient? No

## 2023-11-10 NOTE — Telephone Encounter (Signed)
 Called and LM on confidential VM for approval of these orders

## 2023-11-18 ENCOUNTER — Telehealth: Payer: Self-pay | Admitting: Student in an Organized Health Care Education/Training Program

## 2023-11-18 ENCOUNTER — Telehealth: Payer: Self-pay

## 2023-11-18 DIAGNOSIS — B351 Tinea unguium: Secondary | ICD-10-CM

## 2023-11-18 DIAGNOSIS — G8929 Other chronic pain: Secondary | ICD-10-CM

## 2023-11-18 DIAGNOSIS — N1831 Chronic kidney disease, stage 3a: Secondary | ICD-10-CM

## 2023-11-18 DIAGNOSIS — M5418 Radiculopathy, sacral and sacrococcygeal region: Secondary | ICD-10-CM

## 2023-11-18 DIAGNOSIS — E785 Hyperlipidemia, unspecified: Secondary | ICD-10-CM

## 2023-11-18 DIAGNOSIS — M170 Bilateral primary osteoarthritis of knee: Secondary | ICD-10-CM

## 2023-11-18 DIAGNOSIS — I5032 Chronic diastolic (congestive) heart failure: Secondary | ICD-10-CM

## 2023-11-18 DIAGNOSIS — G609 Hereditary and idiopathic neuropathy, unspecified: Secondary | ICD-10-CM

## 2023-11-18 DIAGNOSIS — D631 Anemia in chronic kidney disease: Secondary | ICD-10-CM

## 2023-11-18 DIAGNOSIS — I130 Hypertensive heart and chronic kidney disease with heart failure and stage 1 through stage 4 chronic kidney disease, or unspecified chronic kidney disease: Secondary | ICD-10-CM

## 2023-11-18 DIAGNOSIS — M8008XD Age-related osteoporosis with current pathological fracture, vertebra(e), subsequent encounter for fracture with routine healing: Secondary | ICD-10-CM

## 2023-11-18 DIAGNOSIS — M5126 Other intervertebral disc displacement, lumbar region: Secondary | ICD-10-CM

## 2023-11-18 NOTE — Telephone Encounter (Signed)
Signed earlier today.  Thank you.

## 2023-11-18 NOTE — Telephone Encounter (Signed)
 [facility] have faxed over paper work needing completed. Sent to provider to fill out, handed back to CMA and has been faxed back.

## 2023-11-18 NOTE — Telephone Encounter (Signed)
Placed in provider sign folder

## 2023-11-18 NOTE — Telephone Encounter (Signed)
 Paper work was sent from [facility] and needs signatures and sent back to its designated place. Paper work is placed in [employer] loss adjuster.  Please advise, Thanks

## 2023-11-21 ENCOUNTER — Telehealth: Payer: Self-pay

## 2023-11-21 NOTE — Telephone Encounter (Signed)
 Home Health orders have been faxed back.

## 2023-11-27 DIAGNOSIS — N1831 Chronic kidney disease, stage 3a: Secondary | ICD-10-CM | POA: Diagnosis not present

## 2023-11-27 DIAGNOSIS — S32019D Unspecified fracture of first lumbar vertebra, subsequent encounter for fracture with routine healing: Secondary | ICD-10-CM | POA: Diagnosis not present

## 2023-11-27 DIAGNOSIS — M170 Bilateral primary osteoarthritis of knee: Secondary | ICD-10-CM | POA: Diagnosis not present

## 2023-11-27 DIAGNOSIS — R600 Localized edema: Secondary | ICD-10-CM | POA: Diagnosis not present

## 2023-12-01 ENCOUNTER — Other Ambulatory Visit: Payer: Self-pay | Admitting: Student in an Organized Health Care Education/Training Program

## 2023-12-01 NOTE — Telephone Encounter (Signed)
 NO LONGER [facility] PATIENT OR PROVIDER

## 2023-12-02 ENCOUNTER — Telehealth: Payer: Self-pay | Admitting: Student in an Organized Health Care Education/Training Program

## 2023-12-02 ENCOUNTER — Other Ambulatory Visit: Payer: Self-pay | Admitting: Student in an Organized Health Care Education/Training Program

## 2023-12-02 ENCOUNTER — Other Ambulatory Visit: Payer: Self-pay

## 2023-12-02 ENCOUNTER — Other Ambulatory Visit: Payer: Self-pay | Admitting: Internal Medicine

## 2023-12-02 NOTE — Telephone Encounter (Signed)
 Home health orders was sent from [facility] and needs signatures and sent back to its designated place. Paper work is placed in [employer] loss adjuster.  Please advise, Thanks

## 2023-12-02 NOTE — Telephone Encounter (Unsigned)
 Copied from CRM [id number]. Topic: Clinical - Medication Refill >> Dec 02, 2023 12:43 PM Abigail D wrote: Medication: atenolol  (TENORMIN ) 50 MG tablet  lisinopril  (ZESTRIL ) 20 MG tablet  Has the patient contacted their pharmacy? Yes, advised to talk to clinic (Agent: If no, request that the patient contact the pharmacy for the refill. If patient does not wish to contact the pharmacy document the reason why and proceed with request.) (Agent: If yes, when and what did the pharmacy advise?)  This is the patient's preferred pharmacy:  [id number] - [place], [place] - [address] AT [facility] [address] [place] [address] Phone: [phone] Fax: [phone]  Is this the correct pharmacy for this prescription? Yes If no, delete pharmacy and type the correct one.   Has the prescription been filled recently? Yes  Is the patient out of the medication? Yes  Has the patient been seen for an appointment in the last year OR does the patient have an upcoming appointment? Yes  Can we respond through MyChart? No  Agent: Please be advised that Rx refills may take up to 3 business days. We ask that you follow-up with your pharmacy.

## 2023-12-02 NOTE — Telephone Encounter (Signed)
 ERROR

## 2023-12-02 NOTE — Telephone Encounter (Unsigned)
 Copied from CRM [id number]. Topic: Clinical - Medication Refill >> Dec 02, 2023 12:39 PM Abigail D wrote: Medication:  alendronate  (FOSAMAX ) 10 MG tablet   Has the patient contacted their pharmacy? Yes, was told to reach out to clinic (Agent: If no, request that the patient contact the pharmacy for the refill. If patient does not wish to contact the pharmacy document the reason why and proceed with request.) (Agent: If yes, when and what did the pharmacy advise?)  This is the patient's preferred pharmacy:  [id number] - [place], [place] - [address] AT [facility] [address] [place] [address] Phone: [phone] Fax: [phone]  Is this the correct pharmacy for this prescription? Yes If no, delete pharmacy and type the correct one.   Has the prescription been filled recently? Yes  Is the patient out of the medication? Yes  Has the patient been seen for an appointment in the last year OR does the patient have an upcoming appointment? Yes  Can we respond through MyChart? No  Agent: Please be advised that Rx refills may take up to 3 business days. We ask that you follow-up with your pharmacy.

## 2023-12-05 MED ORDER — LISINOPRIL 20 MG PO TABS: 20.0000 mg | ORAL_TABLET | Freq: Every day | ORAL | 0 refills | Status: DC

## 2023-12-05 MED ORDER — ATENOLOL 50 MG PO TABS: 50.0000 mg | ORAL_TABLET | Freq: Every day | ORAL | 0 refills | Status: DC

## 2023-12-05 NOTE — Telephone Encounter (Signed)
 Placed in sign folder at nurse station

## 2023-12-12 ENCOUNTER — Telehealth: Payer: Self-pay | Admitting: Student in an Organized Health Care Education/Training Program

## 2023-12-12 NOTE — Telephone Encounter (Signed)
 Home Health Certification or Plan of Care Tracking  Is this a Certification or Plan of Care? Plan  [facility] Agency: [facility]  Order Number:  [id number]  Has charge sheet been attached? Yes  Where has form been placed:   Labeled & placed in provider bin

## 2023-12-12 NOTE — Telephone Encounter (Signed)
 Placed with provider to sign  Signed and returned to be faxed, Faxed back to [facility].

## 2024-01-04 ENCOUNTER — Ambulatory Visit (INDEPENDENT_AMBULATORY_CARE_PROVIDER_SITE_OTHER): Admitting: Podiatry

## 2024-01-04 ENCOUNTER — Encounter: Payer: Self-pay | Admitting: Podiatry

## 2024-01-04 DIAGNOSIS — M79675 Pain in left toe(s): Secondary | ICD-10-CM

## 2024-01-04 DIAGNOSIS — G609 Hereditary and idiopathic neuropathy, unspecified: Secondary | ICD-10-CM

## 2024-01-04 DIAGNOSIS — M79674 Pain in right toe(s): Secondary | ICD-10-CM | POA: Diagnosis not present

## 2024-01-04 DIAGNOSIS — B351 Tinea unguium: Secondary | ICD-10-CM | POA: Diagnosis not present

## 2024-01-04 NOTE — Progress Notes (Signed)
 Subjective:  Patient ID: [name], female    DOB: [birth year],   MRN: [id number]  Chief Complaint  Patient presents with   Debridement    Trim toenails     83 y.o. female presents for concern of thickened elongated and painful nails that are difficult to trim. Requesting to have them trimmed today.   [name]   PCP:  Jerrell Cleatus Ned, MD     . Denies any other pedal complaints. Denies n/v/f/c.   Past Medical History:  Diagnosis Date   Abnormal electromyogram (EMG)    Low grade right S1 radiculopathy 7/93.   Anemia 05/01/2014   Diastolic dysfunction    a. 08/2012 Echo: EF 60-65%, Gr 2 DD, triv MR, PASP .   Distal radius fracture, right 12/22/2015   DJD (degenerative joint disease)    Elevated alkaline phosphatase level    Bone scan 08/2004 showed no evidence of abnormal bony activity.[name]   Herniated lumbar intervertebral disc    S/P L4-5 & L5-S1 laminotomy, foraminotomy, and L5-S1 diskectomy by Dr. Oneil Carwin 11/01/2000.   Hyperlipemia    Hypertension    Iron deficiency anemia    Osteopenia    A DEXA bone density scan done 02/13/2009 showed a left femur neck young adult T-score of -1.6 (osteopenia), a right femur neck young adult T-score of -1.6 (osteopenia), and an AP spine young adult T-score of 1.3 (normal).  Her FRAX score gave an estimated 10 year probability of 6.6% for major osteoporotic fracture and 0.8% for hip fracture.   Peripheral neuropathy    Rotator cuff syndrome of right shoulder    S/P right shoulder arthroscopic debridement of a massive rotator cuff tear, greater tuberosity and resection of subacromial spur by Dr. Dempsey Sensor 07/07/2005.   Stroke [facility]) 04/2013   SVT (supraventricular tachycardia) (HCC)    PSVT   Vitamin D  deficiency     Objective:  Physical Exam: Vascular: DP/PT pulses 2/4 bilateral. CFT <3 seconds. Absent hair growth on digits. Edema noted to bilateral lower extremities. Xerosis noted bilaterally.  Skin. No lacerations or abrasions  bilateral feet. Nails 1-5 bilateral  are thickened discolored  with subungual debris. Hyperkeratotic area noted to distal second digit  healed.  No purulence or eryhtema but edema and discolroation noted to the second digit but has improved. [name] still present but significantly improved.  Musculoskeletal: MMT 5/5 bilateral lower extremities in DF, PF, Inversion and Eversion. Deceased ROM in DF of ankle joint.  Neurological: Sensation intact to light touch. Protective sensation diminished bilateral.   +---------+------------------+-----+--------+--------+  Right   Rt Pressure (mmHg)IndexWaveformComment   +---------+------------------+-----+--------+--------+  Brachial 153                                      +---------+------------------+-----+--------+--------+  PTA     181               1.18 biphasic          +---------+------------------+-----+--------+--------+  DP      175               1.14 biphasic          +---------+------------------+-----+--------+--------+  Great Toe125               0.82 Normal            +---------+------------------+-----+--------+--------+   +---------+------------------+-----+---------+-------+  Left    Lt Pressure (mmHg)IndexWaveform Comment  +---------+------------------+-----+---------+-------+  Brachial 150                                      +---------+------------------+-----+---------+-------+  PTA     162               1.06 triphasic         +---------+------------------+-----+---------+-------+  DP      173               1.13 triphasic         +---------+------------------+-----+---------+-------+  Great Toe108               0.71 Normal            +---------+------------------+-----+---------+-------+   +-------+-----------+-----------+------------+------------+  ABI/TBIToday's ABIToday's TBIPrevious ABIPrevious TBI  +-------+-----------+-----------+------------+------------+   Right 1.18       0.82                                 +-------+-----------+-----------+------------+------------+  Left  1.13       0.71                                 +-------+-----------+-----------+------------+------------+           Summary:  Right: Resting right ankle-brachial index is within normal range. The  right toe-brachial index is normal.    Left: Resting left ankle-brachial index is within normal range. The left  toe-brachial index is normal.   MRI left foot  IMPRESSION: 1. Bone marrow edema of the distal phalanx of the second digit concerning for acute osteomyelitis. 2. Skin irregularity and edema about the tip of the second digit consistent with ulceration. 3. Increased signal of the plantar muscles which may be secondary to muscle strain or myositis. 4. Chronic fractures of the second, third and fourth metatarsal necks. 5. Soft tissue edema about the dorsum of the foot without evidence of fluid collection or abscess.    Assessment:   1. Pain due to onychomycosis of toenails of both feet   2. Idiopathic peripheral neuropathy          Plan:  Patient was evaluated and treated and all questions answered. -Discussed and educated patient on  foot care, especially with  regards to the vascular, neurological and musculoskeletal systems.  -Stressed the importance of good glycemic control and the detriment of not  controlling glucose levels in relation to the foot. -Discussed supportive shoes at all times and checking feet regularly.  -Mechanically debrided all nails 1-5 bilateral using sterile nail nipper and filed with dremel without incident  -Answered all patient questions -Patient to return  in 3 months for at risk foot care -Patient advised to call the office if any problems or questions arise in the meantime.     No follow-ups on file.   [place] Failing, DPM

## 2024-01-19 ENCOUNTER — Encounter: Payer: Self-pay | Admitting: Pharmacist

## 2024-01-19 ENCOUNTER — Other Ambulatory Visit: Payer: Self-pay | Admitting: Pharmacist

## 2024-01-19 MED ORDER — ATORVASTATIN CALCIUM 80 MG PO TABS: 80.0000 mg | ORAL_TABLET | Freq: Every day | ORAL | 3 refills | Status: AC

## 2024-01-19 NOTE — Progress Notes (Signed)
 Pharmacy Quality Measure Review  This patient is appearing on a report for being at risk of failing the adherence measure for cholesterol (statin) and hypertension (ACEi/ARB) medications this calendar year.   Medication: lisinopril  20mg  Last fill date: 08/22/2023 for 90 day supply per adherence report  Reviewed recent refill history in Dr Annemarie database. Actual last refill date was 12/05/2023 for 90 day supply. Patient has 3 refills remaining. Next appointment with PCP is 03/16/2024.    Medication: atorvastatin  Last fill date: 07/24/2023 for 90 day supply. She is way past refill due date. Doesn't look like she has any refills remaining.  Lab Results  Component Value Date   CHOL 233 (H) 08/02/2022   HDL 67 08/02/2022   LDLCALC 157 (H) 08/02/2022   TRIG 53 08/02/2022   CHOLHDL 3.5 08/02/2022     Spoke with her daughter [name] who states patient is supposed to be taking atorvastatin . [facility] [address] - [facility] was called in  Will continue following adherence in 2025 but unfortunately she will fail adherence measure for cholesterol medication for 2025. She is passing adherence measure for hypertensive medications.  Madelin Ray, PharmD Clinical Pharmacist [facility]  Population Health [phone]

## 2024-01-24 ENCOUNTER — Ambulatory Visit

## 2024-01-24 ENCOUNTER — Telehealth: Payer: Self-pay

## 2024-01-24 NOTE — Telephone Encounter (Signed)
 Patients daughter was calling in to change appointment for mother.

## 2024-01-24 NOTE — Telephone Encounter (Signed)
 Communication  Reason for CRM: Daughter is returning phone call to [place]. Mother was in the restroom when she was called but having trouble with the phone. Daughter requested to reschedule for another time when she is there with her mom to help her with the phone call.   Sent to [facility] in error.

## 2024-02-23 ENCOUNTER — Ambulatory Visit: Payer: Self-pay

## 2024-02-23 NOTE — Telephone Encounter (Signed)
 Patients daughter tested positive for Covid Today and daughter states patient started to not feel well this morning, Please see triage below and advise for any changes in recommendations

## 2024-02-23 NOTE — Telephone Encounter (Signed)
 Daughter positive for COVID (inconclusive for covid but negative for flu so her pcp is treating her for covid) Daughter lives with this patient  FYI Only or Action Required?: FYI only for provider.  Patient was last seen in primary care on 09/09/2023 by Jerrell Cleatus Ned, MD.  Called Nurse Triage reporting Cough.  Symptoms began today.  Interventions attempted: Rest, hydration, or home remedies.  Symptoms are: gradually worsening.  Triage Disposition: See Physician Within 24 Hours  Patient/caregiver understands and will follow disposition?: Yes                Copied from CRM #[id number]. Topic: Clinical - Red Word Triage >> Feb 23, 2024  3:16 PM Suzette B wrote: [facility] that prompted transfer to Nurse Triage: Patient's daughter previously called in rgards to her concern with the flu, she stated that her results or not conclusive, but the mother is not experiencing signs of covid, cough has become chronic, weak and fatigue  Refer to  CRM # [id number] Reason for Disposition  [1] Continuous (nonstop) coughing interferes with work or school AND [2] no improvement using cough treatment per Care Advice  Answer Assessment - Initial Assessment Questions Daughter started feeling bad Sunday (4 days ago) --Flu test negative and COVID was inconclusive Mother and daughter live together Daughter states that her doctor said she more than likely has COVID and she was prescribed Paxlovid and cough syrup. This morning the patient (her mother) woke up not feeling well Patient triaged earlier and advised to call back with any changes and since then her cough developed Productive with clear phlegm Daughter wanted patient to come in and be seen by her PCP Dr Jerrell She is advised for them both to wear masks and she agrees  Patient's daughter is advised that if anything worsens to go to the Emergency Room. Patient's daughter verbalized understanding.    1. ONSET: When did the cough  begin?      This morning 2. SEVERITY: How bad is the cough today?      Started coughing today and coughing up mucous 3. SPUTUM: Describe the color of your sputum (e.g., none, dry cough; clear, white, yellow, green)     clear 4. HEMOPTYSIS: Are you coughing up any blood? If Yes, ask: How much? (e.g., flecks, streaks, tablespoons, etc.)     No 5. DIFFICULTY BREATHING: Are you having difficulty breathing? If Yes, ask: How bad is it? (e.g., mild, moderate, severe)      No 6. FEVER: Do you have a fever? If Yes, ask: What is your temperature, how was it measured, and when did it start?     No 7. CARDIAC HISTORY: Do you have any history of heart disease? (e.g., heart attack, congestive heart failure)      Daughter denies 8. LUNG HISTORY: Do you have any history of lung disease?  (e.g., pulmonary embolus, asthma, emphysema)     Daughter denies 63. PE RISK FACTORS: Do you have a history of blood clots? (or: recent major surgery, recent prolonged travel, bedridden)     Is on a blood thinner from TIAs years ago 10. OTHER SYMPTOMS: Do you have any other symptoms? (e.g., runny nose, wheezing, chest pain)       Daughter denies  Protocols used: Cough - Acute Productive-A-AH

## 2024-02-23 NOTE — Telephone Encounter (Signed)
 FYI Only or Action Required?: FYI only for provider.  Patient was last seen in primary care on 09/09/2023 by Jerrell Cleatus Ned, MD.  Called Nurse Triage reporting Fatigue.  Symptoms began today.  Interventions attempted: Nothing.  Symptoms are: stable.  Triage Disposition: Home Care  Patient/caregiver understands and will follow disposition?: Yes  Copied from CRM [id number]. Topic: Clinical - Red Word Triage >> Feb 23, 2024 10:03 AM [name] wrote: Red Word that prompted transfer to Nurse Triage: Weakness Reason for Disposition  Mild weakness or fatigue with acute minor illness (e.g., colds)  Answer Assessment - Initial Assessment Questions Daughter [name] calling on behalf of patient. Daughter tested positive for Covid today, just feeling tired and congested. Patient stated feeling tired this morning. Advised to monitor at home and if symptoms develop to give us  a call back. Educated on proper Special educational needs teacher. Suggested home testing kits from drug store as well. Educated patient's daughter on time effectiveness of testing.     1. DESCRIPTION: Describe how you are feeling.     Woke up feeling weak and tired   2. ONSET: When did these symptoms begin? (e.g., hours, days, weeks, months)     This morning   3. CAUSE: What do you think is causing the weakness or fatigue? (e.g., not drinking enough fluids, medical problem, trouble sleeping)     Daughter tested positive for Covid  4. OTHER SYMPTOMS: Do you have any other symptoms? (e.g., chest pain, fever, cough, SOB, vomiting, diarrhea, bleeding, other areas of pain)     No  Protocols used: Weakness (Generalized) and Fatigue-A-AH

## 2024-02-23 NOTE — Telephone Encounter (Signed)
 FYI, has appointment tomorrow with you

## 2024-02-24 ENCOUNTER — Ambulatory Visit: Admitting: Student in an Organized Health Care Education/Training Program

## 2024-02-27 ENCOUNTER — Encounter: Payer: Self-pay | Admitting: Student in an Organized Health Care Education/Training Program

## 2024-02-27 ENCOUNTER — Ambulatory Visit: Payer: Self-pay | Admitting: Student in an Organized Health Care Education/Training Program

## 2024-02-27 ENCOUNTER — Ambulatory Visit (INDEPENDENT_AMBULATORY_CARE_PROVIDER_SITE_OTHER): Admitting: Student in an Organized Health Care Education/Training Program

## 2024-02-27 VITALS — BP 111/64 | HR 84 | Temp 97.7°F | Wt 175.0 lb

## 2024-02-27 DIAGNOSIS — U071 COVID-19: Secondary | ICD-10-CM | POA: Diagnosis not present

## 2024-02-27 DIAGNOSIS — R059 Cough, unspecified: Secondary | ICD-10-CM

## 2024-02-27 LAB — BASIC METABOLIC PANEL WITH GFR
BUN: 27 mg/dL — ABNORMAL HIGH (ref 6–23)
CO2: 31 meq/L (ref 19–32)
Calcium: 9.7 mg/dL (ref 8.4–10.5)
Chloride: 102 meq/L (ref 96–112)
Creatinine, Ser: 1.29 mg/dL — ABNORMAL HIGH (ref 0.40–1.20)
GFR: 38.64 mL/min — ABNORMAL LOW (ref >60.00)
Glucose, Bld: 96 mg/dL (ref 70–99)
Potassium: 4.4 meq/L (ref 3.5–5.1)
Sodium: 140 meq/L (ref 135–145)

## 2024-02-27 LAB — POCT INFLUENZA A/B
Influenza A, POC: NEGATIVE
Influenza B, POC: NEGATIVE

## 2024-02-27 LAB — POC COVID19 BINAXNOW: SARS Coronavirus 2 Ag: POSITIVE — AB

## 2024-02-27 MED ORDER — NIRMATRELVIR/RITONAVIR (PAXLOVID) TABLET (RENAL DOSING): 2.0000 | ORAL_TABLET | Freq: Two times a day (BID) | ORAL | 0 refills | Status: AC

## 2024-02-27 NOTE — Assessment & Plan Note (Signed)
 Acute issue.  Symptoms started on Friday and progressed over the weekend.  She is feeling a little better today.  Today is day 4 of symptoms.  No signs of pneumonia on exam..  Doing well at home.  Good support from her family.-Frailty of advanced age and number other comorbidities including hypertension and ischemic vascular disease.  She is at increased risk of severe COVID-19, reports this is her first COVID infection, has had vaccinations.  Will check a BMP today, if renal function is normal we will start Paxlovid .  While she is taking Paxlovid , should hold atorvastatin , can restart atorvastatin  3 days after finishing the 5-day course of Paxlovid .

## 2024-02-27 NOTE — Progress Notes (Signed)
 Called patient and spoke to daughter. Okay per DPR. Daughter understands she will take paxlovid  for 5 days and to stop atorvastatin  while taking medication. She knows to restart atorvastatin  3 days after finishing paxlovid 

## 2024-02-27 NOTE — Patient Instructions (Addendum)
  VISIT SUMMARY: Today, you were seen for respiratory symptoms related to a recent COVID-19 infection. You were accompanied by your family member, [name]. We discussed your symptoms, current medications, and overall health status.  YOUR PLAN: -COVID-19 INFECTION: You have a confirmed COVID-19 infection, but your symptoms are improving.  We will check your kidney function today, and based on that I will prescribe Paxlovid  to your pharmacy.  Please monitor your symptoms and call us  if you experience any worsening, such as new fevers or a worsening cough. Make sure to stay hydrated and maintain good nutrition. Recovery is expected soon.  Stop taking atorvastatin  while using the Paxlovid , you could restart the atorvastatin  3 days after you finish the Paxlovid .  -COUGH: Your cough is related to the COVID-19 infection and is expected to persist for a while. It is non-productive, meaning it does not bring up mucus. You can take over-the-counter Mucinex (guaifenesin) to help relieve the cough.  -ESSENTIAL HYPERTENSION: Your blood pressure is well-controlled with your current medications. Continue taking your antihypertensive medications as prescribed.  -OSTEOPOROSIS: You have osteoporosis, a condition where bones become weak and brittle. You have completed physical therapy and are continuing your home exercises, which is excellent. Please keep up with these exercises to maintain your bone health.  INSTRUCTIONS: Please schedule a follow-up appointment in 6 weeks for a check-up and blood work. Continue monitoring your symptoms and call us  if there is any worsening. Stay hydrated and maintain good nutrition.

## 2024-02-27 NOTE — Progress Notes (Signed)
 Acute Office Visit  Subjective:     Patient ID: [name], female    DOB: 12/14/1940, 83 y.o.   MRN: [id number]  Chief Complaint  Patient presents with   Cough    Daughter would like her tested for COVID and Flu due to being exposed.   Patient states she has been coughing a lot and very hard. Runny nose, little headache, little loss in taste and smell, Chills as well. Started Friday 9/05 but patient does state things seem to be getting a little better. No at home test for covid.     HPI  Discussed the use of AI scribe software for clinical note transcription with the patient, who gave verbal consent to proceed.  History of Present Illness [name] is an 83 year old female who presents with respiratory symptoms and a recent COVID-19 infection. She is accompanied by [name], a family member.  She has been experiencing respiratory symptoms since Friday, including a persistent cough and mild fever. The cough was particularly severe on one night, causing her to cough throughout the night. No chest pain, ear pain, or sinus pain, but she mentions occasional soreness in her eyes. She feels short of breath but is able to move around the house.  She was previously tested for COVID-19 and flu. She started feeling unwell on Tuesday after returning to work following a holiday, with symptoms worsening overnight, prompting her to visit a doctor on Thursday.  She has a history of receiving COVID-19 vaccinations and has not had any prior COVID-19 infections. She is currently taking lisinopril , atorvastatin , alendronate , and atenolol . She completed physical therapy about a month ago and continues to perform the exercises taught during therapy. She reports no recent falls and maintains a good appetite.      Objective:    BP 111/64   Pulse 84   Temp 97.7 F (36.5 C) (Temporal)   Wt 175 lb (79.4 kg)   LMP  (LMP Unknown)   SpO2 99%   BMI 27.41 kg/m    Physical Exam  Gen:  Frail-appearing older woman Ears: Normal tympanic membranes bilaterally without erythema or effusions Heart: Regular, 3 out of 6 early systolic murmur best heard at the right upper sternal border Lungs: Unlabored, clear throughout with no crackles Ext: Chronic 1+ nonpitting edema in both lower extremities  Results for orders placed or performed in visit on 02/27/24  POC COVID-19  Result Value Ref Range   SARS Coronavirus 2 Ag Positive (A) Negative  POCT Influenza A/B  Result Value Ref Range   Influenza A, POC Negative Negative   Influenza B, POC Negative Negative       Assessment & Plan:   Problem List Items Addressed This Visit       Unprioritized   COVID-19 virus infection - Primary   Acute issue.  Symptoms started on Friday and progressed over the weekend.  She is feeling a little better today.  Today is day 4 of symptoms.  No signs of pneumonia on exam..  Doing well at home.  Good support from her family.-Frailty of advanced age and number other comorbidities including hypertension and ischemic vascular disease.  She is at increased risk of severe COVID-19, reports this is her first COVID infection, has had vaccinations.  Will check a BMP today, if renal function is normal we will start Paxlovid .  While she is taking Paxlovid , should hold atorvastatin , can restart atorvastatin  3 days after finishing the 5-day course of Paxlovid .  Relevant Orders   Basic metabolic panel with GFR   Other Visit Diagnoses       Cough, unspecified type       Relevant Orders   POC COVID-19 (Completed)   POCT Influenza A/B (Completed)       Cleatus Debby Specking, MD

## 2024-02-27 NOTE — Progress Notes (Signed)
 [name]

## 2024-03-01 NOTE — Telephone Encounter (Signed)
 I called and spoke with the patient's daughter to follow-up on how she is doing with the COVID infection.  She is quite frail and at risk for severe infection.  Thankfully she is doing well at home.  Cough is improved, no more fevers, eating and drinking better.  She started Paxlovid  about 2 days ago and is tolerating that well.  Will finish out that medication course but overall sounds like she is going to recover fine.

## 2024-03-13 ENCOUNTER — Other Ambulatory Visit: Payer: Self-pay | Admitting: Student in an Organized Health Care Education/Training Program

## 2024-03-13 MED ORDER — ATENOLOL 50 MG PO TABS: 50.0000 mg | ORAL_TABLET | Freq: Every day | ORAL | 0 refills | Status: DC

## 2024-03-13 NOTE — Telephone Encounter (Signed)
 Copied from CRM #[id number]. Topic: Clinical - Medication Refill >> Mar 13, 2024  1:50 PM Mercer PEDLAR wrote: Medication: atenolol  (TENORMIN ) 50 MG tablet   Has the patient contacted their pharmacy? Yes (Agent: If no, request that the patient contact the pharmacy for the refill. If patient does not wish to contact the pharmacy document the reason why and proceed with request.) (Agent: If yes, when and what did the pharmacy advise?)  This is the patient's preferred pharmacy:  [id number] [place], [place] - [address] AT [facility] [address] [place] [address] Phone: [phone] Fax: [phone]  Is this the correct pharmacy for this prescription? Yes If no, delete pharmacy and type the correct one.   Has the prescription been filled recently? No  Is the patient out of the medication? Yes  Has the patient been seen for an appointment in the last year OR does the patient have an upcoming appointment? Yes  Can we respond through MyChart? Yes  Agent: Please be advised that Rx refills may take up to 3 business days. We ask that you follow-up with your pharmacy.

## 2024-03-16 ENCOUNTER — Ambulatory Visit: Admitting: Student in an Organized Health Care Education/Training Program

## 2024-03-21 ENCOUNTER — Encounter (HOSPITAL_BASED_OUTPATIENT_CLINIC_OR_DEPARTMENT_OTHER): Payer: Self-pay | Admitting: Pharmacist

## 2024-03-21 NOTE — Progress Notes (Signed)
 Pharmacy Quality Measure Review  This patient is appearing on a report for being at risk of failing the adherence measure for cholesterol (statin) and hypertension (ACEi/ARB) medications this calendar year.   Medication: lisinopril  20mg  Last fill date: 12/05/2023 for 90 day supply per adherence report  Reviewed recent refill history in Dr Annemarie database. Actual last refill date was 03/02/2024 for 90 day supply. Patient has 0 refills remaining. Next appointment with PCP is 05/04/2024   Medication: atorvastatin  Last fill date: 01/22/2024 for 90 day supply. Patient has 2 refill remaining.  Lab Results  Component Value Date   CHOL 233 (H) 08/02/2022   HDL 67 08/02/2022   LDLCALC 157 (H) 08/02/2022   TRIG 53 08/02/2022   CHOLHDL 3.5 08/02/2022    No intervention needed at this time.   Madelin Ray, PharmD Clinical Pharmacist [facility] Primary Care  Population Health [phone]

## 2024-04-04 ENCOUNTER — Ambulatory Visit: Admitting: Podiatry

## 2024-04-09 ENCOUNTER — Ambulatory Visit: Admitting: Student in an Organized Health Care Education/Training Program

## 2024-04-10 ENCOUNTER — Ambulatory Visit (INDEPENDENT_AMBULATORY_CARE_PROVIDER_SITE_OTHER): Admitting: *Deleted

## 2024-04-10 ENCOUNTER — Other Ambulatory Visit: Payer: Self-pay | Admitting: Student in an Organized Health Care Education/Training Program

## 2024-04-10 VITALS — Ht 67.0 in | Wt 175.0 lb

## 2024-04-10 DIAGNOSIS — Z0000 Encounter for general adult medical examination without abnormal findings: Secondary | ICD-10-CM

## 2024-04-10 NOTE — Progress Notes (Signed)
 Subjective:   [name] is a 83 y.o. female who presents for Medicare Annual (Subsequent) preventive examination.  Visit Complete: Virtual I connected with  Cathren CHRISTELLA Shropshire on 04/10/24 by a audio enabled telemedicine application and verified that I am speaking with the correct person using two identifiers.  Patient Location: Home  Provider Location: Home Office  I discussed the limitations of evaluation and management by telemedicine. The patient expressed understanding and agreed to proceed.  Vital Signs: Because this visit was a virtual/telehealth visit, some criteria may be missing or patient reported. Any vitals not documented were not able to be obtained and vitals that have been documented are patient reported.   Cardiac Risk Factors include: advanced age (>84men, >69 women);hypertension;obesity (BMI >30kg/m2)     Objective:    Today's Vitals   04/10/24 0859  Weight: 175 lb (79.4 kg)  Height: 5' 7 (1.702 m)   Body mass index is 27.41 kg/m.     04/10/2024    8:55 AM 03/10/2023    7:44 PM 01/31/2023    9:27 AM 08/02/2022   10:05 AM 06/22/2022   12:06 PM 11/09/2021   10:54 AM 06/29/2021   10:47 AM  Advanced Directives  Does Patient Have a Medical Advance Directive? No No No No No No No  Would patient like information on creating a medical advance directive? No - Patient declined  No - Patient declined No - Patient declined  No - Patient declined Yes (MAU/Ambulatory/Procedural Areas - Information given)    Current Medications (verified) Outpatient Encounter Medications as of 04/10/2024  Medication Sig   alendronate  (FOSAMAX ) 10 MG tablet TAKE 1 TABLET (10 MG TOTAL) BY MOUTH DAILY BEFORE BREAKFAST. TAKE WITH A FULL GLASS OF WATER ON AN EMPTY STOMACH.   atenolol  (TENORMIN ) 50 MG tablet Take 1 tablet (50 mg total) by mouth daily.   atorvastatin  (LIPITOR ) 80 MG tablet Take 1 tablet (80 mg total) by mouth daily.   chlorthalidone  (HYGROTON ) 25 MG tablet TAKE 1 TABLET (25  MG TOTAL) BY MOUTH DAILY.   Cholecalciferol  (VITAMIN D3) 50 MCG (2000 UT) capsule Take 1 capsule (2,000 Units total) by mouth daily.   clopidogrel  (PLAVIX ) 75 MG tablet Take 1 tablet (75 mg total) by mouth daily.   diclofenac  Sodium (VOLTAREN ) 1 % GEL Apply 2 g topically 4 (four) times daily as needed.   fluticasone  (FLONASE ) 50 MCG/ACT nasal spray SPRAY 1 SPRAY INTO BOTH NOSTRILS DAILY.   lisinopril  (ZESTRIL ) 20 MG tablet Take 1 tablet (20 mg total) by mouth daily.   [DISCONTINUED] alendronate  (FOSAMAX ) 10 MG tablet Take 1 tablet (10 mg total) by mouth daily before breakfast. Take with a full glass of water on an empty stomach.   [DISCONTINUED] chlorthalidone  (HYGROTON ) 25 MG tablet Take 1 tablet (25 mg total) by mouth daily.   No facility-administered encounter medications on file as of 04/10/2024.    Allergies (verified) Fluzone [influenza virus vaccine], Sulfonamide derivatives, and Penicillins   History: Past Medical History:  Diagnosis Date   Abnormal electromyogram (EMG)    Low grade right S1 radiculopathy 7/93.   Anemia 05/01/2014   Diastolic dysfunction    a. 08/2012 Echo: EF 60-65%, Gr 2 DD, triv MR, PASP .   Distal radius fracture, right 12/22/2015   DJD (degenerative joint disease)    Elevated alkaline phosphatase level    Bone scan 08/2004 showed no evidence of abnormal bony activity.[name]   Herniated lumbar intervertebral disc    S/P L4-5 & L5-S1 laminotomy,  foraminotomy, and L5-S1 diskectomy by Dr. Oneil Carwin 11/01/2000.   Hyperlipemia    Hypertension    Iron deficiency anemia    Osteopenia    A DEXA bone density scan done 02/13/2009 showed a left femur neck young adult T-score of -1.6 (osteopenia), a right femur neck young adult T-score of -1.6 (osteopenia), and an AP spine young adult T-score of 1.3 (normal).  Her FRAX score gave an estimated 10 year probability of 6.6% for major osteoporotic fracture and 0.8% for hip fracture.   Peripheral neuropathy    Rotator cuff  syndrome of right shoulder    S/P right shoulder arthroscopic debridement of a massive rotator cuff tear, greater tuberosity and resection of subacromial spur by Dr. Dempsey Sensor 07/07/2005.   Stroke [facility]) 04/2013   SVT (supraventricular tachycardia)    PSVT   Vitamin D  deficiency    Past Surgical History:  Procedure Laterality Date   BACK SURGERY     LAPAROSCOPIC CHOLECYSTECTOMY  1998   LUMBAR DISC SURGERY  11/01/2000    L4-5, L5-S1 laminotomy, foraminotomy and L5-S1 diskectomy   SHOULDER ARTHROSCOPY  1/172007   S/P right shoulder arthroscopic debridement of a massive rotator cuff tear, greater tuberoplasty and resection of subacromial spur by Dr. Dempsey Sensor on 07/07/2005.   TOTAL ABDOMINAL HYSTERECTOMY  1992   Family History  Problem Relation Age of Onset   Ovarian cancer Mother 19   Hypertension Mother    Heart attack Mother 59   Emphysema Father    Hypertension Sister    Stroke Sister    Hypertension Sister    Cancer Brother    Breast cancer Neg Hx    Colon cancer Neg Hx    Lung cancer Neg Hx    Social History   Socioeconomic History   Marital status: Single    Spouse name: Not on file   Number of children: 6   Years of education: 12th   Highest education level: Not on file  Occupational History   Occupation: retired  Tobacco Use   Smoking status: Former    Current packs/day: 0.00    Average packs/day: 0.2 packs/day for 2.0 years (0.3 ttl pk-yrs)    Types: Cigarettes    Start date: 10/06/1997    Quit date: 10/07/1999    Years since quitting: 24.5   Smokeless tobacco: Never  Substance and Sexual Activity   Alcohol use: No    Alcohol/week: 0.0 standard drinks of alcohol   Drug use: No   Sexual activity: Not Currently  Other Topics Concern   Not on file  Social History Narrative   Current Social History 06/29/2021        Patient lives with daughter in an apartment which is 1 story. There are not steps up to the entrance the patient uses.       Patient's method  of transportation is via family member.      The highest level of education was high school diploma.      The patient currently disabled.      Identified important Relationships are my oldest son and my daughter that I live with       Pets : 0       Interests / Fun: I don't do a lot of things       Current Stressors: none       Religious / Personal Beliefs: none       Social Drivers of Corporate investment banker Strain: Low Risk  (  04/10/2024)   Overall Financial Resource Strain (CARDIA)    Difficulty of Paying Living Expenses: Not hard at all  Food Insecurity: No Food Insecurity (04/10/2024)   Hunger Vital Sign    Worried About Running Out of Food in the Last Year: Never true    Ran Out of Food in the Last Year: Never true  Transportation Needs: No Transportation Needs (04/10/2024)   PRAPARE - Administrator, Civil Service (Medical): No    Lack of Transportation (Non-Medical): No  Physical Activity: Inactive (04/10/2024)   Exercise Vital Sign    Days of Exercise per Week: 0 days    Minutes of Exercise per Session: 0 min  Stress: No Stress Concern Present (04/10/2024)   [employer] of Occupational Health - Occupational Stress Questionnaire    Feeling of Stress: Not at all  Social Connections: Moderately Isolated (04/10/2024)   Social Connection and Isolation Panel    Frequency of Communication with Friends and Family: Three times a week    Frequency of Social Gatherings with Friends and Family: Three times a week    Attends Religious Services: More than 4 times per year    Active Member of Clubs or Organizations: No    Attends Banker Meetings: Never    Marital Status: Widowed    Tobacco Counseling Counseling given: Not Answered   Clinical Intake:  Pre-visit preparation completed: Yes  Pain : No/denies pain     Diabetes: No  How often do you need to have someone help you when you read instructions, pamphlets, or other  written materials from your doctor or pharmacy?: 1 - Never  Interpreter Needed?: No  Information entered by :: Mliss Graff LPN   Activities of Daily Living    04/10/2024    8:58 AM  In your present state of health, do you have any difficulty performing the following activities:  Hearing? 0  Vision? 0  Difficulty concentrating or making decisions? 0  Walking or climbing stairs? 1  Dressing or bathing? 0  Doing errands, shopping? 1  Preparing Food and eating ? N  Using the Toilet? N  In the past six months, have you accidently leaked urine? Y  Do you have problems with loss of bowel control? N  Managing your Medications? Y  Managing your Finances? N  Housekeeping or managing your Housekeeping? N    Patient Care Team: Jerrell Cleatus Ned, MD as PCP - General (Internal Medicine) Gaynel Delon CROME, DPM as Consulting Physician (Podiatry)  Indicate any recent Medical Services you may have received from other than [facility] providers in the past year (date may be approximate).     Assessment:   This is a routine wellness examination for Nanda.  Hearing/Vision screen Hearing Screening - Comments:: No trouble hearing Vision Screening - Comments:: Does not see had cataract surgery   Goals Addressed             This Visit's Progress    Patient Stated       Stay healthy       Depression Screen    04/10/2024    9:00 AM 09/09/2023   10:14 AM 01/31/2023    8:25 AM 08/02/2022   11:36 AM 11/09/2021   11:22 AM 06/29/2021   10:48 AM 03/30/2021   11:37 AM  PHQ 2/9 Scores  PHQ - 2 Score 0 0 0 0 0 0 0  PHQ- 9 Score 0 0         Fall Risk  04/10/2024    9:56 AM 09/09/2023   10:14 AM 05/23/2023    9:24 AM 03/21/2023   10:58 AM 01/31/2023    8:24 AM  Fall Risk   Falls in the past year? 0 0 1 1 0  Number falls in past yr: 0 0 0 1 0  Injury with Fall? 0 0 1 0 0  Risk for fall due to :  No Fall Risks Impaired balance/gait Impaired balance/gait No Fall Risks;Impaired  balance/gait  Follow up Falls evaluation completed;Education provided;Falls prevention discussed Falls evaluation completed Falls evaluation completed Falls evaluation completed Falls evaluation completed    MEDICARE RISK AT HOME: Medicare Risk at Home Any stairs in or around the home?: No If so, are there any without handrails?: No Home free of loose throw rugs in walkways, pet beds, electrical cords, etc?: Yes Adequate lighting in your home to reduce risk of falls?: Yes Life alert?: No Use of a cane, walker or w/c?: Yes Grab bars in the bathroom?: Yes Shower chair or bench in shower?: Yes Elevated toilet seat or a handicapped toilet?: Yes  TIMED UP AND GO:  Was the test performed?  No    Cognitive Function:        04/10/2024    8:58 AM 06/29/2021   10:50 AM  6CIT Screen  What Year? 0 points 0 points  What month? 0 points 0 points  What time? 0 points 0 points  Count back from 20 0 points 0 points  Months in reverse 4 points 0 points  Repeat phrase 4 points 2 points  Total Score 8 points 2 points    Immunizations Immunization History  Administered Date(s) Administered   Fluad Quad(high Dose 65+) 03/30/2021   Fluad Trivalent(High Dose 65+) 03/21/2023   INFLUENZA, HIGH DOSE SEASONAL PF 03/27/2018   Influenza Split 05/05/2011, 05/31/2012   Influenza Whole 05/04/2006, 07/10/2007, 04/24/2008, 06/25/2009, 03/31/2010   Influenza,inj,Quad PF,6+ Mos 05/10/2013, 04/08/2014, 03/03/2015, 03/29/2016, 02/28/2017, 08/02/2022   PFIZER(Purple Top)SARS-COV-2 Vaccination 10/04/2019, 10/29/2019, 06/22/2020   Pneumococcal Conjugate-13 08/07/2014   Pneumococcal Polysaccharide-23 06/03/2005, 05/05/2011   Td 03/31/2010    TDAP status: Due, Education has been provided regarding the importance of this vaccine. Advised may receive this vaccine at local pharmacy or Health Dept. Aware to provide a copy of the vaccination record if obtained from local pharmacy or Health Dept. Verbalized  acceptance and understanding.  Flu Vaccine status: Due, Education has been provided regarding the importance of this vaccine. Advised may receive this vaccine at local pharmacy or Health Dept. Aware to provide a copy of the vaccination record if obtained from local pharmacy or Health Dept. Verbalized acceptance and understanding.  Pneumococcal vaccine status: Up to date  Covid-19 vaccine status: Information provided on how to obtain vaccines.   Qualifies for Shingles Vaccine? Yes   Zostavax completed No   Shingrix Completed?: No.    Education has been provided regarding the importance of this vaccine. Patient has been advised to call insurance company to determine out of pocket expense if they have not yet received this vaccine. Advised may also receive vaccine at local pharmacy or Health Dept. Verbalized acceptance and understanding.  Screening Tests Health Maintenance  Topic Date Due   Zoster Vaccines- Shingrix (1 of 2) Never done   DTaP/Tdap/Td (2 - Tdap) 03/31/2020   Influenza Vaccine  01/20/2024   COVID-19 Vaccine (4 - 2025-26 season) 02/20/2024   Medicare Annual Wellness (AWV)  04/10/2025   Pneumococcal Vaccine: 50+ Years  Completed   DEXA  SCAN  Completed   Meningococcal B Vaccine  Aged Out   Hepatitis C Screening  Discontinued    Health Maintenance  Health Maintenance Due  Topic Date Due   Zoster Vaccines- Shingrix (1 of 2) Never done   DTaP/Tdap/Td (2 - Tdap) 03/31/2020   Influenza Vaccine  01/20/2024   COVID-19 Vaccine (4 - 2025-26 season) 02/20/2024    Colorectal cancer screening: No longer required.   Mammogram status: No longer required due to  .  Bone Density   Education provided  Lung Cancer Screening: (Low Dose CT Chest recommended if Age 78-80 years, 20 pack-year currently smoking OR have quit w/in 15years.) does not qualify.   Lung Cancer Screening Referral:   Additional Screening:  Hepatitis C Screening: does not qualify; Completed 2017  Vision  Screening: Recommended annual ophthalmology exams for early detection of glaucoma and other disorders of the eye. Is the patient up to date with their annual eye exam?  No  Who is the provider or what is the name of the office in which the patient attends annual eye exams? Education provided If pt is not established with a provider, would they like to be referred to a provider to establish care? No .   Dental Screening: Recommended annual dental exams for proper oral hygiene    Community Resource Referral / Chronic Care Management: CRR required this visit?  No   CCM required this visit?  No     Plan:     I have personally reviewed and noted the following in the patient's chart:   Medical and social history Use of alcohol, tobacco or illicit drugs  Current medications and supplements including opioid prescriptions. Patient is not currently taking opioid prescriptions. Functional ability and status Nutritional status Physical activity Advanced directives List of other physicians Hospitalizations, surgeries, and ER visits in previous 12 months Vitals Screenings to include cognitive, depression, and falls Referrals and appointments  In addition, I have reviewed and discussed with patient certain preventive protocols, quality metrics, and best practice recommendations. A written personalized care plan for preventive services as well as general preventive health recommendations were provided to patient.     Mliss Graff, LPN   [id number]   After Visit Summary: (MyChart) Due to this being a telephonic visit, the after visit summary with patients personalized plan was offered to patient via MyChart   Nurse Notes:

## 2024-04-10 NOTE — Patient Instructions (Signed)
 [name] , Thank you for taking time to come for your Medicare Wellness Visit. I appreciate your ongoing commitment to your health goals. Please review the following plan we discussed and let me know if I can assist you in the future.   Screening recommendations/referrals: Colonoscopy:  Recommended yearly ophthalmology/optometry visit for glaucoma screening and checkup Recommended yearly dental visit for hygiene and checkup  Vaccinations: Influenza vaccine:  Pneumococcal vaccine:  Tdap vaccine:  Shingles vaccine:         Preventive Care 65 Years and Older, Female Preventive care refers to lifestyle choices and visits with your health care provider that can promote health and wellness. What does preventive care include? A yearly physical exam. This is also called an annual well check. Dental exams once or twice a year. Routine eye exams. Ask your health care provider how often you should have your eyes checked. Personal lifestyle choices, including: Daily care of your teeth and gums. Regular physical activity. Eating a healthy diet. Avoiding tobacco and drug use. Limiting alcohol use. Practicing safe sex. Taking low doses of aspirin  every day. Taking vitamin and mineral supplements as recommended by your health care provider. What happens during an annual well check? The services and screenings done by your health care provider during your annual well check will depend on your age, overall health, lifestyle risk factors, and family history of disease. Counseling  Your health care provider may ask you questions about your: Alcohol use. Tobacco use. Drug use. Emotional well-being. Home and relationship well-being. Sexual activity. Eating habits. History of falls. Memory and ability to understand (cognition). Work and work Astronomer. Screening  You may have the following tests or measurements: Height, weight, and BMI. Blood pressure. Lipid and cholesterol levels.  These may be checked every 5 years, or more frequently if you are over 35 years old. Skin check. Lung cancer screening. You may have this screening every year starting at age 42 if you have a 30-pack-year history of smoking and currently smoke or have quit within the past 15 years. Fecal occult blood test (FOBT) of the stool. You may have this test every year starting at age 30. Flexible sigmoidoscopy or colonoscopy. You may have a sigmoidoscopy every 5 years or a colonoscopy every 10 years starting at age 33. Prostate cancer screening. Recommendations will vary depending on your family history and other risks. Hepatitis C blood test. Hepatitis B blood test. Sexually transmitted disease (STD) testing. Diabetes screening. This is done by checking your blood sugar (glucose) after you have not eaten for a while (fasting). You may have this done every 1-3 years. Abdominal aortic aneurysm (AAA) screening. You may need this if you are a current or former smoker. Osteoporosis. You may be screened starting at age 28 if you are at high risk. Talk with your health care provider about your test results, treatment options, and if necessary, the need for more tests. Vaccines  Your health care provider may recommend certain vaccines, such as: Influenza vaccine. This is recommended every year. Tetanus, diphtheria, and acellular pertussis (Tdap, Td) vaccine. You may need a Td booster every 10 years. Zoster vaccine. You may need this after age 71. Pneumococcal 13-valent conjugate (PCV13) vaccine. One dose is recommended after age 53. Pneumococcal polysaccharide (PPSV23) vaccine. One dose is recommended after age 96. Talk to your health care provider about which screenings and vaccines you need and how often you need them. This information is not intended to replace advice given to you by your  health care provider. Make sure you discuss any questions you have with your health care provider. Document Released:  07/04/2015 Document Revised: 02/25/2016 Document Reviewed: 04/08/2015 Elsevier Interactive Patient Education  2017 [employer].  Fall Prevention in the Home Falls can cause injuries. They can happen to people of all ages. There are many things you can do to make your home safe and to help prevent falls. What can I do on the outside of my home? Regularly fix the edges of walkways and driveways and fix any cracks. Remove anything that might make you trip as you walk through a door, such as a raised step or threshold. Trim any bushes or trees on the path to your home. Use bright outdoor lighting. Clear any walking paths of anything that might make someone trip, such as rocks or tools. Regularly check to see if handrails are loose or broken. Make sure that both sides of any steps have handrails. Any raised decks and porches should have guardrails on the edges. Have any leaves, snow, or ice cleared regularly. Use sand or salt on walking paths during winter. Clean up any spills in your garage right away. This includes oil or grease spills. What can I do in the bathroom? Use night lights. Install grab bars by the toilet and in the tub and shower. Do not use towel bars as grab bars. Use non-skid mats or decals in the tub or shower. If you need to sit down in the shower, use a plastic, non-slip stool. Keep the floor dry. Clean up any water that spills on the floor as soon as it happens. Remove soap buildup in the tub or shower regularly. Attach bath mats securely with double-sided non-slip rug tape. Do not have throw rugs and other things on the floor that can make you trip. What can I do in the bedroom? Use night lights. Make sure that you have a light by your bed that is easy to reach. Do not use any sheets or blankets that are too big for your bed. They should not hang down onto the floor. Have a firm chair that has side arms. You can use this for support while you get dressed. Do not have  throw rugs and other things on the floor that can make you trip. What can I do in the kitchen? Clean up any spills right away. Avoid walking on wet floors. Keep items that you use a lot in easy-to-reach places. If you need to reach something above you, use a strong step stool that has a grab bar. Keep electrical cords out of the way. Do not use floor polish or wax that makes floors slippery. If you must use wax, use non-skid floor wax. Do not have throw rugs and other things on the floor that can make you trip. What can I do with my stairs? Do not leave any items on the stairs. Make sure that there are handrails on both sides of the stairs and use them. Fix handrails that are broken or loose. Make sure that handrails are as long as the stairways. Check any carpeting to make sure that it is firmly attached to the stairs. Fix any carpet that is loose or worn. Avoid having throw rugs at the top or bottom of the stairs. If you do have throw rugs, attach them to the floor with carpet tape. Make sure that you have a light switch at the top of the stairs and the bottom of the stairs. If you do  not have them, ask someone to add them for you. What else can I do to help prevent falls? Wear shoes that: Do not have high heels. Have rubber bottoms. Are comfortable and fit you well. Are closed at the toe. Do not wear sandals. If you use a stepladder: Make sure that it is fully opened. Do not climb a closed stepladder. Make sure that both sides of the stepladder are locked into place. Ask someone to hold it for you, if possible. Clearly mark and make sure that you can see: Any grab bars or handrails. First and last steps. Where the edge of each step is. Use tools that help you move around (mobility aids) if they are needed. These include: Canes. Walkers. Scooters. Crutches. Turn on the lights when you go into a dark area. Replace any light bulbs as soon as they burn out. Set up your furniture so  you have a clear path. Avoid moving your furniture around. If any of your floors are uneven, fix them. If there are any pets around you, be aware of where they are. Review your medicines with your doctor. Some medicines can make you feel dizzy. This can increase your chance of falling. Ask your doctor what other things that you can do to help prevent falls. This information is not intended to replace advice given to you by your health care provider. Make sure you discuss any questions you have with your health care provider. Document Released: 04/03/2009 Document Revised: 11/13/2015 Document Reviewed: 07/12/2014 Elsevier Interactive Patient Education  2017 [employer].

## 2024-04-11 ENCOUNTER — Ambulatory Visit (INDEPENDENT_AMBULATORY_CARE_PROVIDER_SITE_OTHER): Admitting: Podiatry

## 2024-04-11 ENCOUNTER — Encounter: Payer: Self-pay | Admitting: Podiatry

## 2024-04-11 DIAGNOSIS — M79675 Pain in left toe(s): Secondary | ICD-10-CM | POA: Diagnosis not present

## 2024-04-11 DIAGNOSIS — G609 Hereditary and idiopathic neuropathy, unspecified: Secondary | ICD-10-CM | POA: Diagnosis not present

## 2024-04-11 DIAGNOSIS — M79674 Pain in right toe(s): Secondary | ICD-10-CM

## 2024-04-11 DIAGNOSIS — B351 Tinea unguium: Secondary | ICD-10-CM

## 2024-04-11 NOTE — Progress Notes (Signed)
 Subjective:  Patient ID: [name], female    DOB: Nov 03, 1940,   MRN: [id number]  Chief Complaint  Patient presents with   Nail Problem   RFC    RFC     83 y.o. female presents for concern of thickened elongated and painful nails that are difficult to trim. Requesting to have them trimmed today.   [name]   PCP:  Jerrell Cleatus Ned, MD     . Denies any other pedal complaints. Denies n/v/f/c.   Past Medical History:  Diagnosis Date   Abnormal electromyogram (EMG)    Low grade right S1 radiculopathy 7/93.   Anemia 05/01/2014   Diastolic dysfunction    a. 08/2012 Echo: EF 60-65%, Gr 2 DD, triv MR, PASP .   Distal radius fracture, right 12/22/2015   DJD (degenerative joint disease)    Elevated alkaline phosphatase level    Bone scan 08/2004 showed no evidence of abnormal bony activity.[name]   Herniated lumbar intervertebral disc    S/P L4-5 & L5-S1 laminotomy, foraminotomy, and L5-S1 diskectomy by Dr. Oneil Carwin 11/01/2000.   Hyperlipemia    Hypertension    Iron deficiency anemia    Osteopenia    A DEXA bone density scan done 02/13/2009 showed a left femur neck young adult T-score of -1.6 (osteopenia), a right femur neck young adult T-score of -1.6 (osteopenia), and an AP spine young adult T-score of 1.3 (normal).  Her FRAX score gave an estimated 10 year probability of 6.6% for major osteoporotic fracture and 0.8% for hip fracture.   Peripheral neuropathy    Rotator cuff syndrome of right shoulder    S/P right shoulder arthroscopic debridement of a massive rotator cuff tear, greater tuberosity and resection of subacromial spur by Dr. Dempsey Sensor 07/07/2005.   Stroke [facility]) 04/2013   SVT (supraventricular tachycardia)    PSVT   Vitamin D  deficiency     Objective:  Physical Exam: Vascular: DP/PT pulses 2/4 bilateral. CFT <3 seconds. Absent hair growth on digits. Edema noted to bilateral lower extremities. Xerosis noted bilaterally.  Skin. No lacerations or abrasions bilateral  feet. Nails 1-5 bilateral  are thickened discolored  with subungual debris. Hyperkeratotic area noted to distal second digit  healed.  No purulence or eryhtema but edema and discolroation noted to the second digit but has improved. [name] still present but significantly improved.  Musculoskeletal: MMT 5/5 bilateral lower extremities in DF, PF, Inversion and Eversion. Deceased ROM in DF of ankle joint.  Neurological: Sensation intact to light touch. Protective sensation diminished bilateral.   +---------+------------------+-----+--------+--------+  Right   Rt Pressure (mmHg)IndexWaveformComment   +---------+------------------+-----+--------+--------+  Brachial 153                                      +---------+------------------+-----+--------+--------+  PTA     181               1.18 biphasic          +---------+------------------+-----+--------+--------+  DP      175               1.14 biphasic          +---------+------------------+-----+--------+--------+  Great Toe125               0.82 Normal            +---------+------------------+-----+--------+--------+   +---------+------------------+-----+---------+-------+  Left    Lt Pressure (mmHg)IndexWaveform Comment  +---------+------------------+-----+---------+-------+  Brachial 150                                      +---------+------------------+-----+---------+-------+  PTA     162               1.06 triphasic         +---------+------------------+-----+---------+-------+  DP      173               1.13 triphasic         +---------+------------------+-----+---------+-------+  Great Toe108               0.71 Normal            +---------+------------------+-----+---------+-------+   +-------+-----------+-----------+------------+------------+  ABI/TBIToday's ABIToday's TBIPrevious ABIPrevious TBI  +-------+-----------+-----------+------------+------------+  Right  1.18       0.82                                 +-------+-----------+-----------+------------+------------+  Left  1.13       0.71                                 +-------+-----------+-----------+------------+------------+           Summary:  Right: Resting right ankle-brachial index is within normal range. The  right toe-brachial index is normal.    Left: Resting left ankle-brachial index is within normal range. The left  toe-brachial index is normal.   MRI left foot  IMPRESSION: 1. Bone marrow edema of the distal phalanx of the second digit concerning for acute osteomyelitis. 2. Skin irregularity and edema about the tip of the second digit consistent with ulceration. 3. Increased signal of the plantar muscles which may be secondary to muscle strain or myositis. 4. Chronic fractures of the second, third and fourth metatarsal necks. 5. Soft tissue edema about the dorsum of the foot without evidence of fluid collection or abscess.    Assessment:   1. Pain due to onychomycosis of toenails of both feet   2. Idiopathic peripheral neuropathy          Plan:  Patient was evaluated and treated and all questions answered. -ABN signed.  -Discussed and educated patient on  foot care, especially with  regards to the vascular, neurological and musculoskeletal systems.  -Stressed the importance of good glycemic control and the detriment of not  controlling glucose levels in relation to the foot. -Discussed supportive shoes at all times and checking feet regularly.  -Mechanically debrided all nails 1-5 bilateral using sterile nail nipper and filed with dremel without incident  -Answered all patient questions -Patient to return  in 3 months for at risk foot care -Patient advised to call the office if any problems or questions arise in the meantime.     Return in about 3 months (around 07/12/2024) for rfc.   [name], DPM

## 2024-05-04 ENCOUNTER — Encounter: Payer: Self-pay | Admitting: Student in an Organized Health Care Education/Training Program

## 2024-05-04 ENCOUNTER — Ambulatory Visit (INDEPENDENT_AMBULATORY_CARE_PROVIDER_SITE_OTHER): Admitting: Student in an Organized Health Care Education/Training Program

## 2024-05-04 VITALS — BP 127/63 | HR 67 | Wt 176.0 lb

## 2024-05-04 DIAGNOSIS — M810 Age-related osteoporosis without current pathological fracture: Secondary | ICD-10-CM

## 2024-05-04 DIAGNOSIS — I998 Other disorder of circulatory system: Secondary | ICD-10-CM | POA: Diagnosis not present

## 2024-05-04 DIAGNOSIS — N1831 Chronic kidney disease, stage 3a: Secondary | ICD-10-CM | POA: Diagnosis not present

## 2024-05-04 DIAGNOSIS — I10 Essential (primary) hypertension: Secondary | ICD-10-CM

## 2024-05-04 MED ORDER — LISINOPRIL 20 MG PO TABS: 20.0000 mg | ORAL_TABLET | Freq: Every day | ORAL | 0 refills | Status: AC

## 2024-05-04 MED ORDER — ATENOLOL 50 MG PO TABS: 50.0000 mg | ORAL_TABLET | Freq: Every day | ORAL | 0 refills | Status: AC

## 2024-05-04 NOTE — Assessment & Plan Note (Signed)
 Chronic and stable.  Renal function was worse in September but I think that was likely related to the acute COVID infection.  Will recheck kidney function today along with a Cystatin C and urine microalbumin.  Will continue with lisinopril  for renal protection.

## 2024-05-04 NOTE — Patient Instructions (Signed)
  VISIT SUMMARY: Today, you came in for a routine follow-up visit. We reviewed your current health status, including your blood pressure, kidney function, osteoporosis, history of stroke, and cholesterol levels. You reported no significant new symptoms since your last visit, and your weight remains stable. We also discussed your current medications and general health maintenance.  YOUR PLAN: -ESSENTIAL HYPERTENSION: Hypertension, or high blood pressure, is being managed well with your current medications. Your blood pressure has improved, and you should continue taking chlorthalidone , lisinopril , and atenolol  as prescribed.  -CHRONIC KIDNEY DISEASE, STAGE 3A: Chronic kidney disease means your kidneys are not working as well as they should. We have ordered blood tests to monitor your kidney function.  -AGE-RELATED OSTEOPOROSIS WITHOUT CURRENT PATHOLOGICAL FRACTURE: Osteoporosis is a condition where bones become weak and brittle. You should continue taking alendronate  to help manage this condition.  -HISTORY OF CEREBROVASCULAR ACCIDENT (STROKE): You had a stroke in 2014. Due to supply issues with Plavix , you have switched to taking 81 mg of aspirin  daily, which is effective for preventing another stroke.  -HYPERLIPIDEMIA: Hyperlipidemia means you have high cholesterol levels. We have ordered blood tests to monitor your cholesterol, and it is important to ensure you have a sufficient supply of atorvastatin .  -GENERAL HEALTH MAINTENANCE: We discussed routine health maintenance, provided you with a printed list of your medications, and scheduled a follow-up appointment in six months. Please call if you have any problems or if your medications are missing.  INSTRUCTIONS: Please ensure you get your blood tests done to monitor your kidney function and cholesterol levels. Continue taking all your medications as prescribed. We have scheduled your next follow-up appointment in six months, but do not hesitate  to call if you experience any problems or if you need any medications.

## 2024-05-04 NOTE — Progress Notes (Signed)
 Established Patient Office Visit  Patient ID: [name], female    DOB: 1941-05-17  Age: 83 y.o. MRN: [id number] PCP: Jerrell Cleatus Ned, MD  Chief Complaint  Patient presents with   Medical Management of Chronic Issues    6 month follow up     Subjective:     HPI  Discussed the use of AI scribe software for clinical note transcription with the patient, who gave verbal consent to proceed.  History of Present Illness [name] is an 83 year old female with hypertension and a history of stroke who presents for a routine follow-up visit.  She has a history of hypertension and is currently taking chlorthalidone , lisinopril , and atenolol  once daily for blood pressure management. She reports no lightheadedness upon standing.  She experienced a stroke in 2014 and was previously taking Plavix  as a blood thinner. However, it has not been filled since April, and she has been using aspirin  instead. She confirms having 81 mg aspirin  at home.  Her weight remains stable at 176 pounds, consistent with her previous weight of 177 pounds in March. She mentions experiencing a runny nose but no other significant symptoms since her last visit in September, when she had a COVID infection.  She denies any falls, trips to the emergency room, or visits to other doctors except for a foot doctor for toenail care. She lives with her daughter and grandson, and she occasionally goes to the grocery store with [employer] assistance.  No new skin concerns, although she inquired about light-colored spots on her skin. She has no issues with her current medications and brought them for review.     Objective:     BP 127/63   Pulse 67   Wt 176 lb (79.8 kg)   LMP  (LMP Unknown)   SpO2 99%   BMI 27.57 kg/m   Physical Exam  Gen: Well-appearing woman, high degree of frailty Heart: Regular, 3 out of 6 early systolic murmur best heard at the right upper sternal border Lungs: Unlabored, clear Ext:  Warm, chronic 1+ nonpitting edema in both lower extremities, compression stockings are in place, sarcopenia diffusely    Assessment & Plan:   Problem List Items Addressed This Visit       High   Osteoporosis (Chronic)   Chronic and stable.  Currently on a 5-year course of alendronate  10 mg daily and she is tolerating that well.  This will run through 2029.  Will check vitamin D  levels today.      Relevant Orders   VITAMIN D  25 Hydroxy (Vit-D Deficiency, Fractures)     Medium    Essential hypertension (Chronic)   Chronic and stable.  Blood pressure well-controlled at 127/63.  High degree of frailty so I will not escalate antihypertensives any further.  No current side effects to antihypertensives.  Plan to continue with chlorthalidone , lisinopril , and atenolol .      Relevant Medications   aspirin  EC 81 MG tablet   atenolol  (TENORMIN ) 50 MG tablet   lisinopril  (ZESTRIL ) 20 MG tablet   Other Relevant Orders   Microalbumin / creatinine urine ratio   Ischemic vascular disease - Primary (Chronic)   History of cerebral infarction in 2014.  At some medication confusion, seems like she has been off Plavix  for several months.  We clarified the confusion and decided to start aspirin  81 mg daily.  She has plenty of this medication at home.  Will continue with atorvastatin  and check lipids today.  Relevant Medications   aspirin  EC 81 MG tablet   atenolol  (TENORMIN ) 50 MG tablet   lisinopril  (ZESTRIL ) 20 MG tablet   Other Relevant Orders   Lipid panel   Stage 3a chronic kidney disease (CKD) (HCC) (Chronic)   Chronic and stable.  Renal function was worse in September but I think that was likely related to the acute COVID infection.  Will recheck kidney function today along with a Cystatin C and urine microalbumin.  Will continue with lisinopril  for renal protection.      Relevant Medications   lisinopril  (ZESTRIL ) 20 MG tablet   Other Relevant Orders   Basic metabolic panel with GFR    Cystatin C with Glomerular Filtration Rate, Estimated (eGFR)   Microalbumin / creatinine urine ratio   Return in about 6 months (around 11/01/2024).    Cleatus Debby Specking, MD [facility]

## 2024-05-04 NOTE — Assessment & Plan Note (Signed)
 Chronic and stable.  Currently on a 5-year course of alendronate  10 mg daily and she is tolerating that well.  This will run through 2029.  Will check vitamin D  levels today.

## 2024-05-04 NOTE — Assessment & Plan Note (Signed)
 History of cerebral infarction in 2014.  At some medication confusion, seems like she has been off Plavix  for several months.  We clarified the confusion and decided to start aspirin  81 mg daily.  She has plenty of this medication at home.  Will continue with atorvastatin  and check lipids today.

## 2024-05-04 NOTE — Assessment & Plan Note (Signed)
 Chronic and stable.  Blood pressure well-controlled at 127/63.  High degree of frailty so I will not escalate antihypertensives any further.  No current side effects to antihypertensives.  Plan to continue with chlorthalidone , lisinopril , and atenolol .

## 2024-05-07 ENCOUNTER — Ambulatory Visit: Payer: Self-pay | Admitting: Student in an Organized Health Care Education/Training Program

## 2024-05-07 LAB — MICROALBUMIN / CREATININE URINE RATIO
Creatinine, Urine: 103 mg/dL (ref 20–275)
Microalb Creat Ratio: 4 mg/g{creat} (ref <30)
Microalb, Ur: 0.4 mg/dL

## 2024-05-07 LAB — BASIC METABOLIC PANEL WITH GFR
BUN/Creatinine Ratio: 21 (calc) (ref 6–22)
BUN: 23 mg/dL (ref 7–25)
CO2: 30 mmol/L (ref 20–32)
Calcium: 10.1 mg/dL (ref 8.6–10.4)
Chloride: 102 mmol/L (ref 98–110)
Creat: 1.1 mg/dL — ABNORMAL HIGH (ref 0.60–0.95)
Glucose, Bld: 81 mg/dL (ref 65–99)
Potassium: 4.5 mmol/L (ref 3.5–5.3)
Sodium: 139 mmol/L (ref 135–146)
eGFR: 50 mL/min/1.73m2 — ABNORMAL LOW (ref >=60)

## 2024-05-07 LAB — VITAMIN D 25 HYDROXY (VIT D DEFICIENCY, FRACTURES): Vit D, 25-Hydroxy: 18 ng/mL — ABNORMAL LOW (ref 30–100)

## 2024-05-07 LAB — LIPID PANEL
Cholesterol: 263 mg/dL — ABNORMAL HIGH (ref <200)
HDL: 65 mg/dL (ref >=50)
LDL Cholesterol (Calc): 177 mg/dL — ABNORMAL HIGH
Non-HDL Cholesterol (Calc): 198 mg/dL — ABNORMAL HIGH (ref <130)
Total CHOL/HDL Ratio: 4 (calc) (ref <5.0)
Triglycerides: 94 mg/dL (ref <150)

## 2024-05-07 LAB — CYSTATIN C WITH GLOMERULAR FILTRATION RATE, ESTIMATED (EGFR)
CYSTATIN C: 1.51 mg/L — ABNORMAL HIGH (ref 0.52–1.07)
eGFR: 38 mL/min/1.73m2 — ABNORMAL LOW (ref >=60)

## 2024-06-18 ENCOUNTER — Emergency Department (HOSPITAL_BASED_OUTPATIENT_CLINIC_OR_DEPARTMENT_OTHER): Admitting: Radiology

## 2024-06-18 ENCOUNTER — Other Ambulatory Visit: Payer: Self-pay

## 2024-06-18 ENCOUNTER — Telehealth: Payer: Self-pay | Admitting: *Deleted

## 2024-06-18 DIAGNOSIS — Z5321 Procedure and treatment not carried out due to patient leaving prior to being seen by health care provider: Secondary | ICD-10-CM | POA: Insufficient documentation

## 2024-06-18 DIAGNOSIS — R600 Localized edema: Secondary | ICD-10-CM | POA: Diagnosis not present

## 2024-06-18 DIAGNOSIS — M25572 Pain in left ankle and joints of left foot: Secondary | ICD-10-CM | POA: Diagnosis present

## 2024-06-18 LAB — CBC WITH DIFFERENTIAL/PLATELET
Abs Immature Granulocytes: 0.01 K/uL (ref 0.00–0.07)
Basophils Absolute: 0 K/uL (ref 0.0–0.1)
Basophils Relative: 1 %
Eosinophils Absolute: 0.1 K/uL (ref 0.0–0.5)
Eosinophils Relative: 3 %
HCT: 31.2 % — ABNORMAL LOW (ref 36.0–46.0)
Hemoglobin: 10.6 g/dL — ABNORMAL LOW (ref 12.0–15.0)
Immature Granulocytes: 0 %
Lymphocytes Relative: 38 %
Lymphs Abs: 1.7 K/uL (ref 0.7–4.0)
MCH: 34 pg (ref 26.0–34.0)
MCHC: 34 g/dL (ref 30.0–36.0)
MCV: 100 fL (ref 80.0–100.0)
Monocytes Absolute: 0.4 K/uL (ref 0.1–1.0)
Monocytes Relative: 8 %
Neutro Abs: 2.3 K/uL (ref 1.7–7.7)
Neutrophils Relative %: 50 %
Platelets: 141 K/uL — ABNORMAL LOW (ref 150–400)
RBC: 3.12 MIL/uL — ABNORMAL LOW (ref 3.87–5.11)
RDW: 13.5 % (ref 11.5–15.5)
WBC: 4.4 K/uL (ref 4.0–10.5)
nRBC: 0 % (ref 0.0–0.2)

## 2024-06-18 LAB — BASIC METABOLIC PANEL WITH GFR
Anion gap: 9 (ref 5–15)
BUN: 23 mg/dL (ref 8–23)
CO2: 28 mmol/L (ref 22–32)
Calcium: 10.3 mg/dL (ref 8.9–10.3)
Chloride: 102 mmol/L (ref 98–111)
Creatinine, Ser: 1.24 mg/dL — ABNORMAL HIGH (ref 0.44–1.00)
GFR, Estimated: 43 mL/min — ABNORMAL LOW
Glucose, Bld: 104 mg/dL — ABNORMAL HIGH (ref 70–99)
Potassium: 4.2 mmol/L (ref 3.5–5.1)
Sodium: 139 mmol/L (ref 135–145)

## 2024-06-18 NOTE — ED Provider Triage Note (Signed)
 Emergency Medicine Provider Triage Evaluation Note  [name] , a 83 y.o. female  was evaluated in triage.  Per PCP note  Patient has had about 1 week of acute pain in the left ankle and inability to bear weight. She has a history of osteoporosis with fragility fracture. No clear trauma, falls, or inversion injuries. However she is at risk for an occult fracture, especially with her memory impairments may not be able to provide history of an event. I ordered a x-ray of the left ankle. She has a an appointment with podiatry already scheduled for a few weeks in January. Will follow back up with the patient once I see the x-ray.   Review of Systems  Positive:  Negative:   Physical Exam  BP 136/80 (BP Location: Right Arm)   Pulse 73   Temp 97.8 F (36.6 C)   Resp 18   LMP  (LMP Unknown)   SpO2 100%  Gen:   Awake, no distress   Resp:  Normal effort  MSK:   Moves extremities without difficulty  Other:    Medical Decision Making  Medically screening exam initiated at 8:19 PM.  Appropriate orders placed.  TREMAINE FUHRIMAN was informed that the remainder of the evaluation will be completed by another provider, this initial triage assessment does not replace that evaluation, and the importance of remaining in the ED until their evaluation is complete.     Hoy Nidia FALCON, [place] 06/18/24 2019

## 2024-06-18 NOTE — Telephone Encounter (Signed)
 Copied from CRM [id number]. Topic: General - Other >> Jun 18, 2024 11:32 AM Diannia H wrote: Reason for CRM: Patient daughter is calling to leave a message for the provider because she put a order in with [facility] and she is needing the provider to send a RX so she can get her mom depends, pads and etc. Could assist? Patients daughter callback is [phone].  She is also wanting the provider to know that her left ankle is swelling and she is complaining that she can barely stand on weight on it. She wants to take her to the ER but patient doesn't want to go and she is wanting to know what she needs to do. Patient is refusing the ER. The patient is wanting to make an appointment with [facility] but daughter think she needs to be seen asap.

## 2024-06-18 NOTE — ED Triage Notes (Signed)
 Sent by Dr for ankle X-ray. Suspects occult fx due to hx. Left ankle pain x1 week. Chronic edema in legs-slightly worse in left today. Denies upper leg pain/calf pain. Difficult to stand on.

## 2024-06-18 NOTE — Telephone Encounter (Signed)
 With the patient's daughter by phone.  Patient has had about 1 week of acute pain in the left ankle and inability to bear weight.  She has a history of osteoporosis with fragility fracture.  No clear trauma, falls, or inversion injuries.  However she is at risk for an occult fracture, especially with her memory impairments may not be able to provide history of an event.  I ordered a x-ray of the left ankle.  She has a an appointment with podiatry already scheduled for a few weeks in January.  Will follow back up with the patient once I see the x-ray.

## 2024-06-19 ENCOUNTER — Emergency Department (HOSPITAL_BASED_OUTPATIENT_CLINIC_OR_DEPARTMENT_OTHER)
Admission: EM | Admit: 2024-06-19 | Discharge: 2024-06-19 | Source: Ambulatory Visit | Attending: Emergency Medicine | Admitting: Emergency Medicine

## 2024-06-19 ENCOUNTER — Telehealth: Payer: Self-pay

## 2024-06-19 NOTE — Telephone Encounter (Signed)
 I tried to call the patient's, left a voice message.  X-ray of the left ankle was reassuring with no fracture or major injury.  Will call back again tomorrow morning and see if I can talk to her then.

## 2024-06-19 NOTE — Telephone Encounter (Signed)
 Please see message below   Copied from CRM #[id number]. Topic: Clinical - Lab/Test Results >> Jun 19, 2024 12:03 PM Debby BROCKS wrote: Reason for CRM: Paitent's daughter [name] Spanner called in stating that the results for her xray are in [place] but they would like to speak to Dr. Jerrell to ask more questions and find out what he wants to do. She does not want a nurse or CMA to call her and states that its something serious and wants to speak to Dr. Jerrell himself

## 2024-06-19 NOTE — ED Notes (Signed)
No answer when called for treatment room.  ?

## 2024-06-20 ENCOUNTER — Encounter (HOSPITAL_COMMUNITY): Payer: Self-pay | Admitting: Emergency Medicine

## 2024-06-20 ENCOUNTER — Emergency Department (HOSPITAL_COMMUNITY)

## 2024-06-20 ENCOUNTER — Telehealth: Payer: Self-pay | Admitting: Student in an Organized Health Care Education/Training Program

## 2024-06-20 ENCOUNTER — Other Ambulatory Visit: Payer: Self-pay

## 2024-06-20 ENCOUNTER — Emergency Department (HOSPITAL_COMMUNITY)
Admission: EM | Admit: 2024-06-20 | Discharge: 2024-06-21 | Discharge status: Against Medical Advice | Attending: Emergency Medicine | Admitting: Emergency Medicine

## 2024-06-20 DIAGNOSIS — Z5321 Procedure and treatment not carried out due to patient leaving prior to being seen by health care provider: Secondary | ICD-10-CM | POA: Diagnosis not present

## 2024-06-20 DIAGNOSIS — M25572 Pain in left ankle and joints of left foot: Secondary | ICD-10-CM | POA: Insufficient documentation

## 2024-06-20 LAB — CBC WITH DIFFERENTIAL/PLATELET
Abs Immature Granulocytes: 0.01 K/uL (ref 0.00–0.07)
Basophils Absolute: 0 K/uL (ref 0.0–0.1)
Basophils Relative: 1 %
Eosinophils Absolute: 0.1 K/uL (ref 0.0–0.5)
Eosinophils Relative: 3 %
HCT: 33.3 % — ABNORMAL LOW (ref 36.0–46.0)
Hemoglobin: 10.6 g/dL — ABNORMAL LOW (ref 12.0–15.0)
Immature Granulocytes: 0 %
Lymphocytes Relative: 39 %
Lymphs Abs: 1.7 K/uL (ref 0.7–4.0)
MCH: 32.4 pg (ref 26.0–34.0)
MCHC: 31.8 g/dL (ref 30.0–36.0)
MCV: 101.8 fL — ABNORMAL HIGH (ref 80.0–100.0)
Monocytes Absolute: 0.4 K/uL (ref 0.1–1.0)
Monocytes Relative: 9 %
Neutro Abs: 2.1 K/uL (ref 1.7–7.7)
Neutrophils Relative %: 48 %
Platelets: 152 K/uL (ref 150–400)
RBC: 3.27 MIL/uL — ABNORMAL LOW (ref 3.87–5.11)
RDW: 13.6 % (ref 11.5–15.5)
WBC: 4.3 K/uL (ref 4.0–10.5)
nRBC: 0 % (ref 0.0–0.2)

## 2024-06-20 LAB — BASIC METABOLIC PANEL WITH GFR
Anion gap: 9 (ref 5–15)
BUN: 20 mg/dL (ref 8–23)
CO2: 28 mmol/L (ref 22–32)
Calcium: 10.5 mg/dL — ABNORMAL HIGH (ref 8.9–10.3)
Chloride: 105 mmol/L (ref 98–111)
Creatinine, Ser: 1.11 mg/dL — ABNORMAL HIGH (ref 0.44–1.00)
GFR, Estimated: 49 mL/min — ABNORMAL LOW
Glucose, Bld: 105 mg/dL — ABNORMAL HIGH (ref 70–99)
Potassium: 3.5 mmol/L (ref 3.5–5.1)
Sodium: 141 mmol/L (ref 135–145)

## 2024-06-20 NOTE — ED Provider Triage Note (Signed)
 Emergency Medicine Provider Triage Evaluation Note  [name] , a 83 y.o. female  was evaluated in triage.  Pt complains of pain to the left foot and ankle, worsening over the past week. No redness or warmth. Mild increased swelling. Does take ASA daily but no other anticoagulation. PCP sent for possible DVT. No history of this. No recent falls or trauma.  Review of Systems  Positive: Pain to left foot an ankle  Negative: Redness, warmth, CP, dyspnea  Physical Exam  BP 131/67   Pulse 70   Temp 99 F (37.2 C) (Oral)   Resp 15   LMP  (LMP Unknown)   SpO2 100%  Gen:   Awake, no distress   Resp:  Normal effort  MSK:   Moves extremities without difficulty , edema to bilateral lower extremities, DP and PT pulses 2+ distally Other:    Medical Decision Making  Medically screening exam initiated at 9:14 PM.  Appropriate orders placed.  [name] was informed that the remainder of the evaluation will be completed by another provider, this initial triage assessment does not replace that evaluation, and the importance of remaining in the ED until their evaluation is complete.  Labs and imaging ordered. No signs of acute distress. Awaiting bed in the back at this time.    Daralene Lonni BIRCH, PA-C 06/20/24 [id number]

## 2024-06-20 NOTE — Telephone Encounter (Signed)
 I spoke with the patient's daughter by phone.  We talked about the x-ray results of the left ankle which are reassuring with no fracture.  However her discomfort is getting worse.  She is not able to bear weight on the left foot.  Swelling is worse.  We decided that she would take the patient to [facility] emergency department later today for an in person evaluation.

## 2024-06-20 NOTE — Telephone Encounter (Signed)
 Patient would like to speak to you and not a CMA. Please advise, I do see a note where you spoke to patients daughter

## 2024-06-20 NOTE — Telephone Encounter (Signed)
 Copied from CRM [id number]. Topic: Clinical - Medical Advice >> Jun 19, 2024  4:55 PM Jayma L wrote: Reason for CRM: patient returning missed call , wants to speak to the pcp directly, advised the note says he would try to callback in the morning.

## 2024-06-20 NOTE — ED Triage Notes (Signed)
 Pt arrives w/ family. Reports unable to bear wt in left foot. Able to lift and have sensation but pt reports pain & inability to bear wt. Wants to rule out blood clots. Does take asa daily.

## 2024-06-21 NOTE — ED Notes (Signed)
 Returned identification labels in to front desk staff and verbalized leaving due to wait times.

## 2024-06-22 ENCOUNTER — Telehealth: Payer: Self-pay

## 2024-06-22 NOTE — Telephone Encounter (Signed)
 Dr. Jerrell - do they typically send us  an order and you sign? Or do you send in the order?

## 2024-06-22 NOTE — Telephone Encounter (Signed)
 Called patients daughter, Tye. Okay per DPR.  Patient went to [facility] to be seen. They waited as soon as they could and then left. Daughter told me that she did get blood work twice and some xrays. She was never seen by a provider. They do see the results of the X-rays from the ED. They want to know what happens next and what do the results mean?  Daughter reported that patient is walking now. She's slow but walking. They are looking for recommendations for home.   I offered a visit to discuss these concerns next week 06/26/24. FYI

## 2024-06-22 NOTE — Telephone Encounter (Signed)
 Copied from CRM #[id number]. Topic: Clinical - Medical Advice >> Jun 20, 2024  3:35 PM Charolett L wrote: Reason for CRM: Dawonna from [facility] called and stated that the Patient needs incontinence supplies and had her to fax it to the office CB# [phone]

## 2024-06-22 NOTE — Telephone Encounter (Signed)
 Received document that [facility] needs last office visit notes, I printed the last 3 visits. They also need details of patients incontinence. Inability to hold bladder, and/or potty training.  All documents have been placed on providers desk for review

## 2024-06-22 NOTE — Telephone Encounter (Signed)
 Sounds like a lot is going on.  And would be really best if I could see her in the office next week for an exam to give her my best advice for how to take care of her mom at home.

## 2024-06-22 NOTE — Telephone Encounter (Signed)
 Office visit scheduled for 06/26/24

## 2024-06-22 NOTE — Telephone Encounter (Signed)
 Copied from CRM [id number]. Topic: Clinical - Medical Advice >> Jun 22, 2024  2:46 PM Graeme ORN wrote: Reason for CRM: Patient daughter called. Olverson,Robin (EC) [phone] Would like to leave message for provider to give her a call back. Since last visit has been to ED. Would like to discuss visit and results. Thank You

## 2024-06-26 ENCOUNTER — Ambulatory Visit (INDEPENDENT_AMBULATORY_CARE_PROVIDER_SITE_OTHER): Admitting: Student in an Organized Health Care Education/Training Program

## 2024-06-26 ENCOUNTER — Encounter: Payer: Self-pay | Admitting: Student in an Organized Health Care Education/Training Program

## 2024-06-26 VITALS — BP 137/58 | HR 78 | Wt 176.0 lb

## 2024-06-26 DIAGNOSIS — I998 Other disorder of circulatory system: Secondary | ICD-10-CM

## 2024-06-26 DIAGNOSIS — M25572 Pain in left ankle and joints of left foot: Secondary | ICD-10-CM

## 2024-06-26 NOTE — Patient Instructions (Signed)
" °  VISIT SUMMARY: Today, you were seen for left ankle pain and swelling that has been affecting your mobility. You were accompanied by your daughter, [name]. We discussed your current symptoms, reviewed your medical history, and made a plan to manage your conditions.  YOUR PLAN: -PRIMARY OSTEOARTHRITIS OF THE LEFT ANKLE AND FOOT: Osteoarthritis is a condition where the protective cartilage that cushions the ends of your bones wears down over time. Your chronic swelling and pain are likely due to an osteoarthritis flare. There is no fracture, and infection and blood clots are unlikely. Continue using the brace and compression socks. Take Tylenol  500 mg, two tablets twice daily for 3-4 days, then as needed. Watch for signs of infection such as fever, skin darkening, and increased tenderness. Keep your podiatry appointment in January.  -ESSENTIAL HYPERTENSION: Hypertension is high blood pressure. Your blood pressure is well-controlled at 137/58 mmHg. Continue taking your medications: Chlorthalidone , Lisinopril , and Atenolol .  -STAGE 3A CHRONIC KIDNEY DISEASE: Chronic kidney disease means your kidneys are not working as well as they should. Your kidney function is stable with no acute issues. Continue with your current management and monitoring.  -ISCHEMIC VASCULAR DISEASE: Ischemic vascular disease involves reduced blood flow to your organs and tissues. You are taking aspirin  to prevent strokes, and there is no evidence of blood clots. Continue taking aspirin  81 mg daily.  -OSTEOPOROSIS: Osteoporosis is a condition where bones become weak and brittle. There are no acute issues at this time. Continue taking Alendronate .  INSTRUCTIONS: Please continue using the brace and compression socks for your ankle. Take Tylenol  500 mg, two tablets twice daily for 3-4 days, then as needed. Monitor for signs of infection such as fever, skin darkening, and increased tenderness. Keep your podiatry appointment in January.  Continue taking your medications for hypertension, kidney disease, vascular disease, and osteoporosis as prescribed. Follow up with your primary care physician as needed.  "

## 2024-06-26 NOTE — Telephone Encounter (Signed)
 Everything was faxed to [facility]

## 2024-06-26 NOTE — Assessment & Plan Note (Signed)
 Chronic swelling and pain are likely due to an osteoarthritis flare, with no fracture on x-ray.  Soft tissue infection seems unlikely based on exam today, no leukocytosis and no systemic symptoms. Her condition improves with a brace and compression socks, but treatment options are limited due to frailty.  I reassured that she is able to bear weight today using an assist device, and her discomfort is slowly improving.  Continue using the brace and compression socks. Take Tylenol  500 mg, two tablets twice daily for 3-4 days, then as needed. Monitor for infection signs such as fever, skin darkening, and increased tenderness. Maintain the podiatry appointment in January, may benefit from local steroid injection.  Would recommend avoiding systemic steroids given her frailty.  I recommended avoiding NSAIDs given her CKD she has good support at home from family.  Overall difficult situation with limited interventions available.

## 2024-06-26 NOTE — Progress Notes (Signed)
 "  Acute Office Visit  Patient ID: [name], female    DOB: January 16, 1941, 84 y.o.   MRN: [id number]  PCP: Jerrell Cleatus Ned, MD  Chief Complaint  Patient presents with   Hospitalization Follow-up    X-ray results     Subjective:     HPI  Discussed the use of AI scribe software for clinical note transcription with the patient, who gave verbal consent to proceed.  History of Present Illness [name] is an 84 year old female with arthritis who presents with left ankle pain and swelling. She is accompanied by her daughter, [name].  She has been experiencing left ankle pain and swelling for the past couple of weeks, which has significantly impacted her mobility. Initially, she was unable to bear weight and required a wheelchair, but she has since progressed to using a walker and is now able to walk around the house with the aid of a brace from a previous injury.  The pain is localized at the bottom of the foot, particularly when pressure is applied. There is swelling in the ankle, but no skin pain. No recent falls or injuries to the ankle. She has not experienced any fevers or chills.  She has visited the emergency room twice for this issue, where blood work was completed, but patient did not wait to be evaluated by a physician. She has a history of arthritis and is established with [facility].  She lives with her older sister, who is 35 years old and assists with daily activities such as cooking and cleaning.      Objective:    BP (!) 137/58   Pulse 78   Wt 176 lb (79.8 kg)   LMP  (LMP Unknown)   SpO2 99%   BMI 27.57 kg/m   Physical Exam  Gen: Well-appearing woman, ambulating with the use of a rolling walker, accompanied by her daughter. Ext: Warm and well-perfused bilaterally.  Compression stockings are in place.  2+ sclerotic nonpitting edema in both lower extremities is about equal. Skin: On the left lower leg there is some chronic stasis dermatitis  changes.  No rash, no erythema, no skin breakdown, no drainage, no tenderness to palpation Feet: Left foot has a severe pes planus and chronic inversion consistent with some amount of Charcot deformity.  She is able to partially bear weight.  Normal range of motion without tenderness.  She has tenderness with resisted range of motion.  Thickened onychomycosis.  Palpable DP pulse on the left.     Assessment & Plan:   Problem List Items Addressed This Visit       Medium    Ischemic vascular disease (Chronic)     Unprioritized   Left ankle pain - Primary   Chronic swelling and pain are likely due to an osteoarthritis flare, with no fracture on x-ray.  Soft tissue infection seems unlikely based on exam today, no leukocytosis and no systemic symptoms. Her condition improves with a brace and compression socks, but treatment options are limited due to frailty.  I reassured that she is able to bear weight today using an assist device, and her discomfort is slowly improving.  Continue using the brace and compression socks. Take Tylenol  500 mg, two tablets twice daily for 3-4 days, then as needed. Monitor for infection signs such as fever, skin darkening, and increased tenderness. Maintain the podiatry appointment in January, may benefit from local steroid injection.  Would recommend avoiding systemic steroids given  her frailty.  I recommended avoiding NSAIDs given her CKD she has good support at home from family.  Overall difficult situation with limited interventions available.        Return if symptoms worsen or fail to improve.  Cleatus Debby Specking, MD [facility]   "

## 2024-06-29 ENCOUNTER — Telehealth: Payer: Self-pay | Admitting: Student in an Organized Health Care Education/Training Program

## 2024-06-29 NOTE — Telephone Encounter (Signed)
 Type of form received: [facility]  Additional comments:   Received by: Fax  Form should be Faxed/mailed to: (address/ fax #) [phone]  Is patient requesting call for pickup:  Form placed:  provider bin  Attach charge sheet.  Provider will determine charge.  Individual made aware of 3-5 business day turn around Yes?

## 2024-07-02 NOTE — Telephone Encounter (Signed)
 Placed on Desk for review and signature, reviewed and no action needed from MA

## 2024-07-03 ENCOUNTER — Telehealth: Payer: Self-pay | Admitting: Student in an Organized Health Care Education/Training Program

## 2024-07-03 NOTE — Telephone Encounter (Signed)
 Type of form received: [facility]  Additional comments:   Received by: Fax  Form should be Faxed/mailed to: (address/ fax #) [phone]  Is patient requesting call for pickup:  Form placed:  provider bin  Attach charge sheet.  Provider will determine charge.  Individual made aware of 3-5 business day turn around Yes?

## 2024-07-03 NOTE — Telephone Encounter (Signed)
 Copy or form we have already completed

## 2024-07-11 ENCOUNTER — Ambulatory Visit: Admitting: Podiatry

## 2024-07-11 ENCOUNTER — Encounter: Payer: Self-pay | Admitting: Podiatry

## 2024-07-11 DIAGNOSIS — G609 Hereditary and idiopathic neuropathy, unspecified: Secondary | ICD-10-CM

## 2024-07-11 DIAGNOSIS — B351 Tinea unguium: Secondary | ICD-10-CM

## 2024-07-11 DIAGNOSIS — M79674 Pain in right toe(s): Secondary | ICD-10-CM

## 2024-07-11 DIAGNOSIS — M79675 Pain in left toe(s): Secondary | ICD-10-CM

## 2024-07-11 DIAGNOSIS — M7672 Peroneal tendinitis, left leg: Secondary | ICD-10-CM

## 2024-07-11 NOTE — Progress Notes (Signed)
 "   Subjective:  Patient ID: [name], female    DOB: 28-Jun-1940,   MRN: [id number]  Chief Complaint  Patient presents with   Nail Problem    She needs her toenails trimmed.   Foot Pain    She's been to the ER twice since she's been here about her left ankle.  There's been some days that she can hardly walk.  The have her wearing this brace.    84 y.o. female presents for concern of thickened elongated and painful nails that are difficult to trim. Requesting to have them trimmed today.   Relates new concern today of left ankle pain that started several weeks ago.  She was attempted to be seen in ED but never got fully evaluated.  She did have x-rays taken that showed no fractures.  She is advised to follow-up here for further evaluation.  [name]   PCP:  Carla Cleatus Ned, MD     . Denies any other pedal complaints. Denies n/v/f/c.   Past Medical History:  Diagnosis Date   Abnormal electromyogram (EMG)    Low grade right S1 radiculopathy 7/93.   Anemia 05/01/2014   Diastolic dysfunction    a. 08/2012 Echo: EF 60-65%, Gr 2 DD, triv MR, PASP .   Distal radius fracture, right 12/22/2015   DJD (degenerative joint disease)    Elevated alkaline phosphatase level    Bone scan 08/2004 showed no evidence of abnormal bony activity.[name]   Herniated lumbar intervertebral disc    S/P L4-5 & L5-S1 laminotomy, foraminotomy, and L5-S1 diskectomy by Dr. Oneil Cook 11/01/2000.   Hyperlipemia    Hypertension    Iron deficiency anemia    Osteopenia    A DEXA bone density scan done 02/13/2009 showed a left femur neck young adult T-score of -1.6 (osteopenia), a right femur neck young adult T-score of -1.6 (osteopenia), and an AP spine young adult T-score of 1.3 (normal).  Her FRAX score gave an estimated 10 year probability of 6.6% for major osteoporotic fracture and 0.8% for hip fracture.   Peripheral neuropathy    Rotator cuff syndrome of right shoulder    S/P right shoulder arthroscopic  debridement of a massive rotator cuff tear, greater tuberosity and resection of subacromial spur by Dr. Dempsey Cook 07/07/2005.   Stroke [facility]) 04/2013   SVT (supraventricular tachycardia)    PSVT   Vitamin D  deficiency     Objective:  Physical Exam: Vascular: DP/PT pulses 2/4 bilateral. CFT <3 seconds. Absent hair growth on digits. Edema noted to bilateral lower extremities. Xerosis noted bilaterally.  Skin. No lacerations or abrasions bilateral feet. Nails 1-5 bilateral  are thickened discolored  with subungual debris. Hyperkeratotic area noted to distal second digit  healed.  No purulence or eryhtema but edema and discolroation noted to the second digit but has improved. [name] still present but significantly improved.  Musculoskeletal: MMT 5/5 bilateral lower extremities in DF, PF, Inversion and Eversion. Deceased ROM in DF of ankle joint.  Tender along peroneal tendons and lateral ankle area.  Significant edema noted in the area.  No tenderness anterior ankle or medial ankle noted. Neurological: Sensation intact to light touch. Protective sensation diminished bilateral.   +---------+------------------+-----+--------+--------+  Right   Rt Pressure (mmHg)IndexWaveformComment   +---------+------------------+-----+--------+--------+  Brachial 153                                      +---------+------------------+-----+--------+--------+  PTA     181               1.18 biphasic          +---------+------------------+-----+--------+--------+  DP      175               1.14 biphasic          +---------+------------------+-----+--------+--------+  Great Toe125               0.82 Normal            +---------+------------------+-----+--------+--------+   +---------+------------------+-----+---------+-------+  Left    Lt Pressure (mmHg)IndexWaveform Comment  +---------+------------------+-----+---------+-------+  Brachial 150                                       +---------+------------------+-----+---------+-------+  PTA     162               1.06 triphasic         +---------+------------------+-----+---------+-------+  DP      173               1.13 triphasic         +---------+------------------+-----+---------+-------+  Great Toe108               0.71 Normal            +---------+------------------+-----+---------+-------+   +-------+-----------+-----------+------------+------------+  ABI/TBIToday's ABIToday's TBIPrevious ABIPrevious TBI  +-------+-----------+-----------+------------+------------+  Right 1.18       0.82                                 +-------+-----------+-----------+------------+------------+  Left  1.13       0.71                                 +-------+-----------+-----------+------------+------------+           Summary:  Right: Resting right ankle-brachial index is within normal range. The  right toe-brachial index is normal.    Left: Resting left ankle-brachial index is within normal range. The left  toe-brachial index is normal.   MRI left foot  IMPRESSION: 1. Bone marrow edema of the distal phalanx of the second digit concerning for acute osteomyelitis. 2. Skin irregularity and edema about the tip of the second digit consistent with ulceration. 3. Increased signal of the plantar muscles which may be secondary to muscle strain or myositis. 4. Chronic fractures of the second, third and fourth metatarsal necks. 5. Soft tissue edema about the dorsum of the foot without evidence of fluid collection or abscess.    Assessment:   1. Pain due to onychomycosis of toenails of both feet   2. Idiopathic peripheral neuropathy   3. Peroneal tendonitis, left           Plan:  Patient was evaluated and treated and all questions answered. -ABN signed.  -Discussed and educated patient on  foot care, especially with  regards to the vascular, neurological and  musculoskeletal systems.  -Stressed the importance of good glycemic control and the detriment of not  controlling glucose levels in relation to the foot. -Discussed supportive shoes at all times and checking feet regularly.  -Mechanically debrided all nails 1-5 bilateral using sterile nail nipper and filed with dremel without incident  -Answered all  patient questions -X-rays reviewed and discussed with patient.  No acute fractures or dislocations noted. Discussed peroneal tendinitis and treatment options at length with patient Discussed stretching exercises and provided handout. Continue with ankle brace she has and Tylenol  as needed. Discussed that if the symptoms do not improve can consider PT/MRI. Patient to return in 6 to 8 weeks or sooner if symptoms fail to improve or worsen.      Return in about 6 weeks (around 08/22/2024) for l peroneal .   Asberry Failing, DPM     "

## 2024-07-11 NOTE — Patient Instructions (Signed)
 Peroneal Tendinopathy Rehab Ask your health care provider which exercises are safe for you. Do exercises exactly as told by your health care provider and adjust them as directed. It is normal to feel mild stretching, pulling, tightness, or discomfort as you do these exercises. Stop right away if you feel sudden pain or your pain gets worse. Do not begin these exercises until told by your health care provider. Stretching and range-of-motion exercises These exercises warm up your muscles and joints. They can help improve the movement and flexibility of your ankle. They may also help to relieve pain and stiffness. Gastrocnemius and soleus stretch, standing This is an exercise in which you stand on a step and use your body weight to stretch your calf muscles. To do this exercise: Stand on the edge of a step on the ball of your left / right foot. The ball of your foot is on the walking surface, right under your toes. Keep your other foot firmly on the same step. Hold on to the wall, a railing, or a chair for balance. Slowly lift your other foot, allowing your body weight to press your left / right heel down over the edge of the step. You should feel a stretch in your left / right calf (gastrocnemius and soleus). Hold this position for __________ seconds. Return both feet to the step. Repeat this exercise with a slight bend in your left / right knee. Repeat __________ times with your left / right knee straight and __________ times with your left / right knee bent. Complete this exercise __________ times a day. Strengthening exercises These exercises build strength and endurance in your foot and ankle. Endurance is the ability to use your muscles for a long time, even after they get tired. Ankle dorsiflexion with band  Secure a rubber exercise band or tube to an object, such as a table leg, that will not move when the band is pulled. Secure the other end of the band around your left / right foot. Sit on  the floor. Face the object with your left / right leg extended. The band or tube should be slightly tense when your foot is relaxed. Slowly flex your left / right ankle and toes to bring your foot toward you (dorsiflexion). Hold this position for __________ seconds. Let the band or tube slowly pull your foot back to the starting position. Repeat __________ times. Complete this exercise __________ times a day. Ankle eversion  Sit on the floor with your legs straight out in front of you. Loop a rubber exercise band or tube around the ball of your left / right foot. The ball of your foot is on the walking surface, right under your toes. Hold the ends of the band in your hands. You can also secure the band to a stable object. The band or tube should be slightly tense when your foot is relaxed. Slowly push your foot outward, away from your other leg (eversion). Hold this position for __________ seconds. Slowly return your foot to the starting position. Repeat __________ times. Complete this exercise __________ times a day. Plantar flexion, standing This exercise is sometimes called a standing heel raise. Stand with your feet shoulder-width apart. Place your hands on a wall or table to steady yourself as needed. Try not to use it for support. Keep your weight spread evenly over the width of your feet while you slowly rise up on your toes (plantar flexion). If told by your health care provider: Shift your weight  toward your left / right leg until you feel challenged. Stand on your left / right leg only. Hold this position for __________ seconds. Repeat __________ times. Complete this exercise __________ times a day. Single leg stand  Without shoes, stand near a railing or in a doorway. You may hold on to the railing or doorframe as needed. Stand on your left / right foot. Keep your big toe down on the floor and try to keep your arch lifted. Do not roll to the outside of your foot. If this  exercise is too easy, you can try it with your eyes closed or while standing on a pillow. Hold this position for __________ seconds. Repeat __________ times. Complete this exercise __________ times a day. This information is not intended to replace advice given to you by your health care provider. Make sure you discuss any questions you have with your health care provider. Document Revised: 10/01/2021 Document Reviewed: 10/01/2021 Elsevier Patient Education  2024 [employer].

## 2024-07-13 ENCOUNTER — Ambulatory Visit: Payer: Self-pay

## 2024-07-13 NOTE — Telephone Encounter (Signed)
 FYI Only or Action Required?: Action required by provider: clinical question for provider and update on patient condition.  Patient was last seen in primary care on 06/26/2024 by Jerrell Cleatus Ned, MD.  Called Nurse Triage reporting Pressure Ulcer.  Symptoms began several days ago.  Interventions attempted: Nothing.  Symptoms are: gradually worsening.  Triage Disposition: Home Care  Patient/caregiver understands and will follow disposition?: Yes     Message from Chiquita SQUIBB sent at 07/13/2024  5:00 PM EST  Reason for Triage: Patients daughter is calling in stating that the patient got a small pressure sore and it has suddenly got much worse, she stated the patient is in a lot of pain when she get in bed due to the sore. She states it also really red.    Sacrum; size of a dime, redness, not open  Other sites, healing opening, one is size of match stem vs toothpick    Reason for Disposition  1 or 2 small sores  Answer Assessment - Initial Assessment Questions Pt's daughter, [name], contacted clinic for wound care advice. She reports pt has 3 small sacral wounds, two are open but not draining, one other that is red and very painful to pt. Discussed anyway she is able to get pressure off the area with rotating weight from hip to hip q2h, using donut pillow and otc mepilex foam pads for additional protection at the skin. Encouraged her not to use neosporin or peroxide to sites at peroxide can hinder healing and neosporin has an antifungal property that will further irritate the skin. Discussed it is best to keep the skin, clean and dry. Discussed otc wound cleaners and wound wash but encouraged her to be cautious as products could cause burning. Encouraged her to reach out with any further concerns. No appt made as home care is preferred at this time.     1. APPEARANCE of SORES: What do the sores look like?     Sacral wounds; one the size of a dime, red, no broken skin and no  drainage      Two additional wounds that are elongated and open, one the size of a match stem and other the length of a toothpick   2. NUMBER: How many sores are there?     3; one of concern   3. SIZE: How big is the largest sore?     Size of a dime   4. LOCATION: Where are the sores located?     Sacrum   5. ONSET: When did the sores begin?     Ongoing; reports pt has been dealing with wounds. States that wounds will heal then reopen   6. TENDER: Does it hurt when you touch it?  (Scale 1-10; or mild, moderate, severe)      Pt's daughter reports pt stating red wound that is not open is very tender and painful   7. CAUSE: What do you think is causing the sores?     Pressure   8. OTHER SYMPTOMS: Do you have any other symptoms? (e.g., fever, new weakness)     None  Protocols used: Sores-A-AH

## 2024-07-16 NOTE — Telephone Encounter (Signed)
 Yes please, we are limited today with virtual visits only, I would like to check in with them.

## 2024-07-16 NOTE — Telephone Encounter (Signed)
 Called and spoke to patients daughter. She said that she will wait to schedule appt. Sore look a lot better and seem to be healing. They will call back if anything changes.

## 2024-07-16 NOTE — Telephone Encounter (Signed)
 Patients daughter is calling in stating that the patient got a small pressure sore and it has suddenly got much worse, she stated the patient is in a lot of pain when she get in bed due to the sore. She states it also really red.     Sacrum; size of a dime, redness, not open  Other sites, healing opening, one is size of match stem vs toothpick   Please advise, I can schedule a virtual if you would like

## 2024-08-22 ENCOUNTER — Ambulatory Visit: Admitting: Podiatry

## 2024-10-10 ENCOUNTER — Ambulatory Visit: Admitting: Podiatry

## 2024-11-02 ENCOUNTER — Ambulatory Visit: Admitting: Student in an Organized Health Care Education/Training Program

## 2025-05-07 ENCOUNTER — Ambulatory Visit
